# Patient Record
Sex: Female | Born: 1955 | Race: Black or African American | Hispanic: No | Marital: Single
Health system: Southern US, Academic
[De-identification: ages and names within clinical notes are randomized; demographics above are authoritative.]

## PROBLEM LIST (undated history)

## (undated) ENCOUNTER — Ambulatory Visit

## (undated) ENCOUNTER — Telehealth

## (undated) ENCOUNTER — Encounter

## (undated) ENCOUNTER — Other Ambulatory Visit

## (undated) ENCOUNTER — Encounter: Attending: Rheumatology | Primary: Rheumatology

## (undated) ENCOUNTER — Ambulatory Visit: Attending: Pharmacist | Primary: Pharmacist

## (undated) ENCOUNTER — Encounter: Attending: Physical Medicine & Rehabilitation | Primary: Physical Medicine & Rehabilitation

## (undated) ENCOUNTER — Encounter
Attending: Student in an Organized Health Care Education/Training Program | Primary: Student in an Organized Health Care Education/Training Program

## (undated) ENCOUNTER — Encounter: Attending: Internal Medicine | Primary: Internal Medicine

## (undated) ENCOUNTER — Ambulatory Visit: Payer: PRIVATE HEALTH INSURANCE

## (undated) ENCOUNTER — Telehealth: Attending: Rheumatology | Primary: Rheumatology

## (undated) ENCOUNTER — Ambulatory Visit: Attending: Clinical | Primary: Clinical

## (undated) ENCOUNTER — Ambulatory Visit
Payer: BLUE CROSS/BLUE SHIELD | Attending: Student in an Organized Health Care Education/Training Program | Primary: Student in an Organized Health Care Education/Training Program

## (undated) ENCOUNTER — Ambulatory Visit: Payer: Medicare (Managed Care)

## (undated) ENCOUNTER — Ambulatory Visit: Payer: MEDICARE

## (undated) ENCOUNTER — Encounter: Attending: Clinical | Primary: Clinical

## (undated) ENCOUNTER — Encounter: Attending: Pharmacist | Primary: Pharmacist

## (undated) ENCOUNTER — Telehealth: Attending: Ambulatory Care | Primary: Ambulatory Care

## (undated) ENCOUNTER — Ambulatory Visit
Payer: MEDICARE | Attending: Student in an Organized Health Care Education/Training Program | Primary: Student in an Organized Health Care Education/Training Program

## (undated) ENCOUNTER — Encounter: Attending: Ambulatory Care | Primary: Ambulatory Care

## (undated) ENCOUNTER — Inpatient Hospital Stay

## (undated) ENCOUNTER — Ambulatory Visit: Payer: Medicare (Managed Care) | Attending: Orthopaedic Surgery | Primary: Orthopaedic Surgery

## (undated) ENCOUNTER — Encounter: Attending: Family | Primary: Family

## (undated) ENCOUNTER — Encounter: Attending: Women's Health | Primary: Women's Health

## (undated) ENCOUNTER — Ambulatory Visit
Attending: Student in an Organized Health Care Education/Training Program | Primary: Student in an Organized Health Care Education/Training Program

## (undated) ENCOUNTER — Telehealth: Attending: Nurse Practitioner | Primary: Nurse Practitioner

## (undated) ENCOUNTER — Telehealth
Attending: Student in an Organized Health Care Education/Training Program | Primary: Student in an Organized Health Care Education/Training Program

## (undated) ENCOUNTER — Ambulatory Visit: Payer: MEDICARE | Attending: Physical Medicine & Rehabilitation | Primary: Physical Medicine & Rehabilitation

## (undated) ENCOUNTER — Ambulatory Visit: Payer: BLUE CROSS/BLUE SHIELD

## (undated) ENCOUNTER — Telehealth: Attending: Social Worker | Primary: Social Worker

## (undated) ENCOUNTER — Encounter
Payer: BLUE CROSS/BLUE SHIELD | Attending: Physical Medicine & Rehabilitation | Primary: Physical Medicine & Rehabilitation

## (undated) ENCOUNTER — Encounter: Attending: Hospitalist | Primary: Hospitalist

## (undated) ENCOUNTER — Encounter
Attending: Pharmacist Clinician (PhC)/ Clinical Pharmacy Specialist | Primary: Pharmacist Clinician (PhC)/ Clinical Pharmacy Specialist

## (undated) ENCOUNTER — Ambulatory Visit
Payer: Medicare (Managed Care) | Attending: Student in an Organized Health Care Education/Training Program | Primary: Student in an Organized Health Care Education/Training Program

## (undated) ENCOUNTER — Telehealth: Attending: Clinical | Primary: Clinical

## (undated) ENCOUNTER — Telehealth: Attending: Family | Primary: Family

## (undated) ENCOUNTER — Telehealth: Attending: Internal Medicine | Primary: Internal Medicine

## (undated) ENCOUNTER — Ambulatory Visit
Payer: PRIVATE HEALTH INSURANCE | Attending: Rehabilitative and Restorative Service Providers" | Primary: Rehabilitative and Restorative Service Providers"

## (undated) ENCOUNTER — Ambulatory Visit
Payer: BLUE CROSS/BLUE SHIELD | Attending: Physical Medicine & Rehabilitation | Primary: Physical Medicine & Rehabilitation

## (undated) ENCOUNTER — Encounter: Payer: BLUE CROSS/BLUE SHIELD | Attending: Rheumatology | Primary: Rheumatology

## (undated) ENCOUNTER — Ambulatory Visit: Payer: BLUE CROSS/BLUE SHIELD | Attending: Rheumatology | Primary: Rheumatology

## (undated) ENCOUNTER — Non-Acute Institutional Stay: Payer: BLUE CROSS/BLUE SHIELD

## (undated) DIAGNOSIS — K219 Gastro-esophageal reflux disease without esophagitis: Secondary | ICD-10-CM

## (undated) DIAGNOSIS — F32A Depression, unspecified: Secondary | ICD-10-CM

## (undated) DIAGNOSIS — F329 Major depressive disorder, single episode, unspecified: Secondary | ICD-10-CM

## (undated) DIAGNOSIS — M069 Rheumatoid arthritis, unspecified: Secondary | ICD-10-CM

## (undated) DIAGNOSIS — I4891 Unspecified atrial fibrillation: Secondary | ICD-10-CM

## (undated) HISTORY — PX: ATRIAL FIBRILLATION ABLATION: SHX5732

## (undated) HISTORY — PX: OVARIAN CYST REMOVAL: SHX89

## (undated) MED ORDER — METHOCARBAMOL 500 MG TABLET
Freq: Four times a day (QID) | ORAL | 0 days
Start: ? — End: 2020-04-07

---
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
## 2003-04-04 ENCOUNTER — Other Ambulatory Visit: Admission: RE | Admit: 2003-04-04 | Discharge: 2003-04-04 | Payer: Self-pay | Admitting: Internal Medicine

## 2004-05-12 ENCOUNTER — Emergency Department: Payer: Self-pay | Admitting: General Practice

## 2005-02-01 ENCOUNTER — Ambulatory Visit: Payer: Self-pay | Admitting: Internal Medicine

## 2005-02-01 ENCOUNTER — Other Ambulatory Visit: Admission: RE | Admit: 2005-02-01 | Discharge: 2005-02-01 | Payer: Self-pay | Admitting: Internal Medicine

## 2005-06-10 ENCOUNTER — Ambulatory Visit: Payer: Self-pay | Admitting: Family Medicine

## 2005-07-05 ENCOUNTER — Ambulatory Visit: Payer: Self-pay | Admitting: Internal Medicine

## 2005-08-20 ENCOUNTER — Inpatient Hospital Stay: Payer: Self-pay | Admitting: Internal Medicine

## 2005-09-16 ENCOUNTER — Ambulatory Visit: Payer: Self-pay | Admitting: Internal Medicine

## 2005-09-23 ENCOUNTER — Observation Stay: Payer: Self-pay | Admitting: Internal Medicine

## 2006-01-03 ENCOUNTER — Ambulatory Visit: Payer: Self-pay | Admitting: Internal Medicine

## 2011-01-01 DIAGNOSIS — F17200 Nicotine dependence, unspecified, uncomplicated: Secondary | ICD-10-CM | POA: Diagnosis present

## 2014-01-08 ENCOUNTER — Emergency Department: Payer: Self-pay | Admitting: Emergency Medicine

## 2014-01-09 LAB — BASIC METABOLIC PANEL
ANION GAP: 8 (ref 7–16)
BUN: 23 mg/dL — ABNORMAL HIGH (ref 7–18)
CHLORIDE: 108 mmol/L — AB (ref 98–107)
CO2: 23 mmol/L (ref 21–32)
Calcium, Total: 9 mg/dL (ref 8.5–10.1)
Creatinine: 1.05 mg/dL (ref 0.60–1.30)
GFR CALC NON AF AMER: 59 — AB
GLUCOSE: 104 mg/dL — AB (ref 65–99)
OSMOLALITY: 282 (ref 275–301)
POTASSIUM: 4.1 mmol/L (ref 3.5–5.1)
Sodium: 139 mmol/L (ref 136–145)

## 2014-01-09 LAB — CBC
HCT: 44.1 % (ref 35.0–47.0)
HGB: 14.8 g/dL (ref 12.0–16.0)
MCH: 30.4 pg (ref 26.0–34.0)
MCHC: 33.5 g/dL (ref 32.0–36.0)
MCV: 91 fL (ref 80–100)
Platelet: 299 10*3/uL (ref 150–440)
RBC: 4.85 10*6/uL (ref 3.80–5.20)
RDW: 13.7 % (ref 11.5–14.5)
WBC: 12.2 10*3/uL — AB (ref 3.6–11.0)

## 2014-01-09 LAB — TROPONIN I

## 2014-08-26 ENCOUNTER — Emergency Department
Admit: 2014-08-26 | Disposition: A | Discharge status: Home / Self Care | Payer: Self-pay | Admitting: Emergency Medicine

## 2016-01-26 ENCOUNTER — Emergency Department: Payer: 59

## 2016-01-26 ENCOUNTER — Inpatient Hospital Stay
Admission: EM | Admit: 2016-01-26 | Discharge: 2016-01-27 | DRG: 540 | Payer: 59 | Attending: Internal Medicine | Admitting: Internal Medicine

## 2016-01-26 DIAGNOSIS — F17200 Nicotine dependence, unspecified, uncomplicated: Secondary | ICD-10-CM | POA: Diagnosis present

## 2016-01-26 DIAGNOSIS — Z79899 Other long term (current) drug therapy: Secondary | ICD-10-CM

## 2016-01-26 DIAGNOSIS — Z9889 Other specified postprocedural states: Secondary | ICD-10-CM | POA: Diagnosis not present

## 2016-01-26 DIAGNOSIS — I4891 Unspecified atrial fibrillation: Secondary | ICD-10-CM | POA: Diagnosis present

## 2016-01-26 DIAGNOSIS — M86172 Other acute osteomyelitis, left ankle and foot: Principal | ICD-10-CM

## 2016-01-26 DIAGNOSIS — F329 Major depressive disorder, single episode, unspecified: Secondary | ICD-10-CM | POA: Diagnosis present

## 2016-01-26 DIAGNOSIS — K219 Gastro-esophageal reflux disease without esophagitis: Secondary | ICD-10-CM | POA: Diagnosis present

## 2016-01-26 DIAGNOSIS — Z833 Family history of diabetes mellitus: Secondary | ICD-10-CM | POA: Diagnosis not present

## 2016-01-26 DIAGNOSIS — I4892 Unspecified atrial flutter: Secondary | ICD-10-CM | POA: Diagnosis present

## 2016-01-26 DIAGNOSIS — M1990 Unspecified osteoarthritis, unspecified site: Secondary | ICD-10-CM | POA: Diagnosis not present

## 2016-01-26 DIAGNOSIS — M869 Osteomyelitis, unspecified: Secondary | ICD-10-CM | POA: Diagnosis present

## 2016-01-26 DIAGNOSIS — Z803 Family history of malignant neoplasm of breast: Secondary | ICD-10-CM

## 2016-01-26 DIAGNOSIS — M069 Rheumatoid arthritis, unspecified: Secondary | ICD-10-CM | POA: Diagnosis present

## 2016-01-26 DIAGNOSIS — F32A Depression, unspecified: Secondary | ICD-10-CM | POA: Diagnosis present

## 2016-01-26 HISTORY — DX: Gastro-esophageal reflux disease without esophagitis: K21.9

## 2016-01-26 HISTORY — DX: Rheumatoid arthritis, unspecified: M06.9

## 2016-01-26 HISTORY — DX: Unspecified atrial fibrillation: I48.91

## 2016-01-26 HISTORY — DX: Depression, unspecified: F32.A

## 2016-01-26 HISTORY — DX: Major depressive disorder, single episode, unspecified: F32.9

## 2016-01-26 LAB — COMPREHENSIVE METABOLIC PANEL
ALBUMIN: 3.9 g/dL (ref 3.5–5.0)
ALT: 19 U/L (ref 14–54)
ANION GAP: 8 (ref 5–15)
AST: 25 U/L (ref 15–41)
Alkaline Phosphatase: 59 U/L (ref 38–126)
BILIRUBIN TOTAL: 0.4 mg/dL (ref 0.3–1.2)
BUN: 24 mg/dL — ABNORMAL HIGH (ref 6–20)
CHLORIDE: 109 mmol/L (ref 101–111)
CO2: 22 mmol/L (ref 22–32)
Calcium: 8.9 mg/dL (ref 8.9–10.3)
Creatinine, Ser: 1.11 mg/dL — ABNORMAL HIGH (ref 0.44–1.00)
GFR calc Af Amer: >60 mL/min (ref >60)
GFR, EST NON AFRICAN AMERICAN: 54 mL/min — AB (ref >60)
Glucose, Bld: 106 mg/dL — ABNORMAL HIGH (ref 65–99)
POTASSIUM: 3.9 mmol/L (ref 3.5–5.1)
Sodium: 139 mmol/L (ref 135–145)
TOTAL PROTEIN: 7.9 g/dL (ref 6.5–8.1)

## 2016-01-26 LAB — CBC WITH DIFFERENTIAL/PLATELET
BASOS ABS: 0.1 10*3/uL (ref 0–0.1)
BASOS PCT: 1 %
EOS PCT: 2 %
Eosinophils Absolute: 0.3 10*3/uL (ref 0–0.7)
HEMATOCRIT: 43.4 % (ref 35.0–47.0)
Hemoglobin: 14.5 g/dL (ref 12.0–16.0)
Lymphocytes Relative: 17 %
Lymphs Abs: 2 10*3/uL (ref 1.0–3.6)
MCH: 31.1 pg (ref 26.0–34.0)
MCHC: 33.5 g/dL (ref 32.0–36.0)
MCV: 92.9 fL (ref 80.0–100.0)
MONO ABS: 1.2 10*3/uL — AB (ref 0.2–0.9)
MONOS PCT: 10 %
NEUTROS ABS: 8.5 10*3/uL — AB (ref 1.4–6.5)
Neutrophils Relative %: 70 %
PLATELETS: 252 10*3/uL (ref 150–440)
RBC: 4.67 MIL/uL (ref 3.80–5.20)
RDW: 15.4 % — AB (ref 11.5–14.5)
WBC: 12.1 10*3/uL — ABNORMAL HIGH (ref 3.6–11.0)

## 2016-01-26 MED ORDER — SODIUM CHLORIDE 0.9 % IV SOLN
INTRAVENOUS | Status: AC
  Administered 2016-01-26: 23:00:00 via INTRAVENOUS

## 2016-01-26 MED ORDER — PREDNISONE 10 MG PO TABS
10.0000 mg | ORAL_TABLET | Freq: Every day | ORAL | Status: DC
  Administered 2016-01-27: 10 mg via ORAL
  Filled 2016-01-26: qty 1

## 2016-01-26 MED ORDER — OXYCODONE HCL 5 MG PO TABS
5.0000 mg | ORAL_TABLET | ORAL | Status: DC | PRN
  Administered 2016-01-26: 5 mg via ORAL
  Filled 2016-01-26: qty 1

## 2016-01-26 MED ORDER — VANCOMYCIN HCL IN DEXTROSE 1-5 GM/200ML-% IV SOLN
1000.0000 mg | Freq: Two times a day (BID) | INTRAVENOUS | Status: DC
  Administered 2016-01-27: 1000 mg via INTRAVENOUS
  Filled 2016-01-26 (×4): qty 200

## 2016-01-26 MED ORDER — ACETAMINOPHEN 325 MG PO TABS: 650.0000 mg | ORAL_TABLET | Freq: Four times a day (QID) | ORAL | Status: DC | PRN

## 2016-01-26 MED ORDER — HYDROXYCHLOROQUINE SULFATE 200 MG PO TABS
200.0000 mg | ORAL_TABLET | Freq: Two times a day (BID) | ORAL | Status: DC
Start: 2016-01-26 — End: 2016-01-27
  Administered 2016-01-26 – 2016-01-27 (×2): 200 mg via ORAL
  Filled 2016-01-26 (×2): qty 1

## 2016-01-26 MED ORDER — ACETAMINOPHEN 650 MG RE SUPP: 650.0000 mg | Freq: Four times a day (QID) | RECTAL | Status: DC | PRN

## 2016-01-26 MED ORDER — ONDANSETRON HCL 4 MG/2ML IJ SOLN: 4.0000 mg | Freq: Four times a day (QID) | INTRAMUSCULAR | Status: DC | PRN

## 2016-01-26 MED ORDER — SODIUM CHLORIDE 0.9% FLUSH
3.0000 mL | Freq: Two times a day (BID) | INTRAVENOUS | Status: DC
  Administered 2016-01-27: 3 mL via INTRAVENOUS

## 2016-01-26 MED ORDER — BUPROPION HCL ER (SR) 150 MG PO TB12
150.0000 mg | ORAL_TABLET | Freq: Two times a day (BID) | ORAL | Status: DC
  Administered 2016-01-26 – 2016-01-27 (×2): 150 mg via ORAL
  Filled 2016-01-26 (×2): qty 1

## 2016-01-26 MED ORDER — PANTOPRAZOLE SODIUM 40 MG PO TBEC
40.0000 mg | DELAYED_RELEASE_TABLET | Freq: Every day | ORAL | Status: DC
  Administered 2016-01-27: 40 mg via ORAL
  Filled 2016-01-26: qty 1

## 2016-01-26 MED ORDER — VANCOMYCIN HCL IN DEXTROSE 1-5 GM/200ML-% IV SOLN
1000.0000 mg | Freq: Once | INTRAVENOUS | Status: AC
  Administered 2016-01-26: 1000 mg via INTRAVENOUS
  Filled 2016-01-26 (×2): qty 200

## 2016-01-26 MED ORDER — DEXTROSE 5 % IV SOLN
2.0000 g | Freq: Three times a day (TID) | INTRAVENOUS | Status: DC
  Administered 2016-01-26 – 2016-01-27 (×3): 2 g via INTRAVENOUS
  Filled 2016-01-26 (×6): qty 2

## 2016-01-26 MED ORDER — RIVAROXABAN 10 MG PO TABS: 20.0000 mg | ORAL_TABLET | Freq: Every day | ORAL | Status: DC

## 2016-01-26 MED ORDER — ONDANSETRON HCL 4 MG PO TABS: 4.0000 mg | ORAL_TABLET | Freq: Four times a day (QID) | ORAL | Status: DC | PRN

## 2016-01-26 NOTE — ED Provider Notes (Addendum)
[facility] Emergency Department Provider Note   ____________________________________________   First MD Initiated Contact with Patient 01/26/16 1740     (approximate)  I have reviewed the triage vital signs and the nursing notes.   HISTORY  Chief Complaint Toe Pain   HPI Jenna Davis is a 60 y.o. female  with a history of rheumatoid arthritis is presenting with bilateral great toe pain. She said that she do flare of her rheumatoid arthritis several weeks ago and has had swelling in her bilateral toes ever since. She said that she had x-rays done of the left great toe which is been painful over the past several weeks and was sent directly over to the emergency department for further evaluation because of possible septic arthritis. She says that she also has swelling to the right great toe which is also painful. Denies any fevers. Says is compliant with her Humira. Ulcer take methotrexate.   Past Medical History:  Diagnosis Date  . Rheumatoid arthritis (HCC)     There are no active problems to display for this patient.   Past Surgical History:  Procedure Laterality Date  . ATRIAL FIBRILLATION ABLATION      Prior to Admission medications   Not on File    Allergies Review of patient's allergies indicates no known allergies.  No family history on file.  Social History Social History  Substance Use Topics  . Smoking status: Current Every Day Smoker  . Smokeless tobacco: Never Used  . Alcohol use No    Review of Systems Constitutional: No fever/chills Eyes: No visual changes. ENT: No sore throat. Cardiovascular: Denies chest pain. Respiratory: Denies shortness of breath. Gastrointestinal: No abdominal pain.  No nausea, no vomiting.  No diarrhea.  No constipation. Genitourinary: Negative for dysuria. Musculoskeletal: Negative for back pain. Skin: Negative for rash. Neurological: Negative for headaches, focal weakness or  numbness.  10-point ROS otherwise negative.  ____________________________________________   PHYSICAL EXAM:  VITAL SIGNS: ED Triage Vitals  Enc Vitals Group     BP 01/26/16 1651 136/72     Pulse Rate 01/26/16 1651 81     Resp 01/26/16 1651 18     Temp 01/26/16 1651 97.9 F (36.6 C)     Temp Source 01/26/16 1651 Oral     SpO2 01/26/16 1651 99 %     Weight 01/26/16 1651 205 lb (93 kg)     Height 01/26/16 1651 5\' 6"  (1.676 m)     Head Circumference --      Peak Flow --      Pain Score 01/26/16 1652 8     Pain Loc --      Pain Edu? --      Excl. in GC? --     Constitutional: Alert and oriented. Well appearing and in no acute distress. Eyes: Conjunctivae are normal. PERRL. EOMI. Head: Atraumatic. Nose: No congestion/rhinnorhea. Mouth/Throat: Mucous membranes are moist.   Neck: No stridor.   Cardiovascular: Normal rate, regular rhythm. Grossly normal heart sounds.   Respiratory: Normal respiratory effort.  No retractions. Lungs CTAB. Gastrointestinal: Soft and nontender. No distention.  Musculoskeletal: Left great toe with swelling which is concentric. Tenderness palpation especially over the interphalangeal joint. Moderately warm as well as tender to palpation. Patient is able to fully range this left great toe. Also with the right great toe with swelling at the MTP joint which extends medially and is fluctuant with mild erythema. Swelling is about 3 cm in diameter and extends about  2 cm medially off of the MTP joint. The right toe is also with full range of active motion as well as sensation intact to light touch as is also present on the left. Dorsalis pedis pulses are present and equal bilaterally. Neurologic:  Normal speech and language. No gross focal neurologic deficits are appreciated. No gait instability. Skin:  Skin is warm, dry and intact. No rash noted. Psychiatric: Mood and affect are normal. Speech and behavior are  normal.  ____________________________________________   LABS (all labs ordered are listed, but only abnormal results are displayed)  Labs Reviewed  CBC WITH DIFFERENTIAL/PLATELET - Abnormal; Notable for the following:       Result Value   WBC 12.1 (*)    RDW 15.4 (*)    Neutro Abs 8.5 (*)    Monocytes Absolute 1.2 (*)    All other components within normal limits  COMPREHENSIVE METABOLIC PANEL - Abnormal; Notable for the following:    Glucose, Bld 106 (*)    BUN 24 (*)    Creatinine, Ser 1.11 (*)    GFR calc non Af Amer 54 (*)    All other components within normal limits   ____________________________________________  EKG   ____________________________________________  RADIOLOGY  Alisia Ferrari Great Left (Accession [id number]) (Order [id number])  Imaging  Date: 01/26/2016 Department: [facility] EMERGENCY DEPARTMENT Released By: Lynelle Smoke, RN (auto-released) Authorizing: Jene Every, MD  PACS Images   Show images for DG Toe Great Left  Study Result   CLINICAL DATA:  Swelling, pain  EXAM: LEFT GREAT TOE  COMPARISON:  None.  FINDINGS: Three views of the left great toe submitted. There is bone destruction and irregularity distal aspect of proximal phalanx. There is adjacent soft tissue swelling. Findings highly suspicious for osteomyelitis or septic arthritis. Clinical correlation is necessary. Further evaluation with MRI is recommended.  IMPRESSION: There is bone destruction and irregularity distal aspect of proximal phalanx. Findings highly suspicious for osteomyelitis or septic arthritis. Clinical correlation is necessary. Further evaluation with MRI is recommended.   Electronically Signed   By: Natasha Mead M.D.   On: 01/26/2016 18:03    DG Toe Great Right (Accession [id number]) (Order [id number])  Imaging  Date: 01/26/2016 Department: [facility] EMERGENCY DEPARTMENT Released By/Authorizing: Myrna Blazer, MD (auto-released)  PACS Images   Show images for DG Toe Great Right  Study Result   CLINICAL DATA:  Swelling toe, tender to touch for 4-5 weeks. History of rheumatoid arthritis.  EXAM: RIGHT GREAT TOE  COMPARISON:  None.  FINDINGS: No acute fracture deformity or dislocation. Joint space intact without erosions. Soft tissue planes are not suspicious. First metatarsal tibial bipartite sesamoid. The first MTP medial soft tissue swelling without subcutaneous gas or radiopaque foreign bodies. Slight erosive changes of the first metatarsal head.  IMPRESSION: Medial MTP soft tissue swelling and first metatarsal head erosion concerning for gout.   Electronically Signed   By: Awilda Metro M.D.   On: 01/26/2016 19:29     ____________________________________________   PROCEDURES  Procedure(s) performed:   Procedures  Critical Care performed:   ____________________________________________   INITIAL IMPRESSION / ASSESSMENT AND PLAN / ED COURSE  Pertinent labs & imaging results that were available during my care of the patient were reviewed by me and considered in my medical decision making (see chart for details).  ----------------------------------------- 8:09 PM on 01/26/2016 -----------------------------------------  I discussed the case with Dr. Graciela Husbands of podiatry who recommends admission as well as  IV antibiotics for osteomyelitis. He says that he is considering obtaining a terrible will need to discuss further and examine the patient. I discussed with the patient the need for [facility] and further evaluation by the podiatrist in order to evaluate best course of action. She is aware of the need for admission as well as antibiotics and willing to comply.  Clinical Course     ____________________________________________   FINAL CLINICAL IMPRESSION(S) / ED DIAGNOSES  Osteomyelitis of the left great toe. Right great toe toe acute  arthritis to the MTP joint.    NEW MEDICATIONS STARTED DURING THIS VISIT:  New Prescriptions   No medications on file     Note:  This document was prepared using Dragon voice recognition software and may include unintentional dictation errors.    Myrna Blazer, MD 01/26/16 2010  Dr. Graciela Husbands to follow as a consult. Signed out to Dr. Hilton Sinclair.    Myrna Blazer, MD 01/26/16 2010

## 2016-01-26 NOTE — Progress Notes (Signed)
Pharmacy Antibiotic Note  Jenna Davis is a 60 y.o. female admitted on 01/26/2016 with osteomyelitis.  Pharmacy has been consulted for cefepime/vancomycin dosing.  Plan: Will initiated Vancomycin 1000mg  IV x 1 then Vancomycin 1000mg  IV Q12h after 1st dose per stacked dose protocol. Will order Vancomycin trough level prior to the fifth dose. Goal trough 15-68mcg/mL. Initiated Cefepime 2g IV Q8h.   Ke=0.057 T1/2= 12.16 Vd=50.95 Estimated trough=19  Height: 5\' 6"  (167.6 cm) Weight: 205 lb (93 kg) IBW/kg (Calculated) : 59.3  Temp (24hrs), Avg:97.9 F (36.6 C), Min:97.9 F (36.6 C), Max:97.9 F (36.6 C)   Recent Labs Lab 01/26/16 1655  WBC 12.1*  CREATININE 1.11*    Estimated Creatinine Clearance: 63.5 mL/min (by C-G formula based on SCr of 1.11 mg/dL).    No Known Allergies  Antimicrobials this admission: Cefepime 9/8 >>  Vancomycin 9/8 >>   Microbiology results: 9/8 BCx: recommend  Thank you for allowing pharmacy to be a part of this patient's care.  11/8 01/26/2016 8:12 PM

## 2016-01-26 NOTE — ED Triage Notes (Signed)
Left great toe pain for 4-5 weeks. Had xray by PCP in August. Sent to ER from [facility] walk in. Great toe swollen, odorous, tender to touch. No obvious drainage at this time. Pt alert and oriented X4, active, cooperative, pt in NAD. RR even and unlabored, color WNL.

## 2016-01-26 NOTE — H&P (Signed)
[facility] [place] at [facility]   PATIENT NAME: Jenna Davis    MR#:  [id number]  DATE OF BIRTH:  [birth year]  DATE OF ADMISSION:  01/26/2016  PRIMARY CARE PHYSICIAN: Patterson Hammersmith, MD   REQUESTING/REFERRING PHYSICIAN: Pershing Proud, MD  CHIEF COMPLAINT:   Chief Complaint  Patient presents with  . Toe Pain    HISTORY OF PRESENT ILLNESS:  Jenna Davis  is a 60 y.o. female who presents with Left great toe swelling and joint pain. Patient states she was recently diagnosed with rheumatoid arthritis and felt that her joint pain was due to that. However, the pain has progressed recently and she came to the ED for evaluation. X-ray imaging here strongly suggests osteomyelitis in that joint. Podiatry was consulted from the ED recommended admission with IV antibiotics and they will consult on her in the morning. Hospitalists were called for the same.  PAST MEDICAL HISTORY:   Past Medical History:  Diagnosis Date  . A-fib (HCC)   . Depression   . GERD (gastroesophageal reflux disease)   . RA (rheumatoid arthritis) (HCC)   . Rheumatoid arthritis (HCC)     PAST SURGICAL HISTORY:   Past Surgical History:  Procedure Laterality Date  . ATRIAL FIBRILLATION ABLATION    . OVARIAN CYST REMOVAL      SOCIAL HISTORY:   Social History  Substance Use Topics  . Smoking status: Current Every Day Smoker  . Smokeless tobacco: Never Used  . Alcohol use No    FAMILY HISTORY:   Family History  Problem Relation Age of Onset  . Diabetes Mother   . Breast cancer Mother   . Diabetes Father     DRUG ALLERGIES:  No Known Allergies  MEDICATIONS AT HOME:   Prior to Admission medications   Medication Sig Start Date End Date Taking? Authorizing Provider  Adalimumab 40 MG/0.8ML PNKT Inject 40 mg into the skin daily. Due  Wednesday 01/31/2016 12/25/15  Yes Historical Provider, MD  buPROPion (WELLBUTRIN SR) 150 MG 12 hr tablet Take 1 tablet by mouth 2 (two) times  daily. 01/12/16  Yes Historical Provider, MD  folic acid (FOLVITE) 1 MG tablet Take 1 mg by mouth daily. 01/04/15  Yes Historical Provider, MD  hydroxychloroquine (PLAQUENIL) 200 MG tablet Take 1 tablet by mouth 2 (two) times daily. 01/22/16  Yes Historical Provider, MD  ibuprofen (ADVIL,MOTRIN) 600 MG tablet Take 600 mg by mouth every 6 (six) hours. 09/22/15  Yes Historical Provider, MD  methotrexate (RHEUMATREX) 2.5 MG tablet Take 8 tablets by mouth once a week. On wednesdays 12/26/15  Yes Historical Provider, MD  omeprazole (PRILOSEC) 20 MG capsule Take 20 mg by mouth daily. 01/23/16  Yes Historical Provider, MD  predniSONE (DELTASONE) 5 MG tablet Take 2 tablets by mouth daily. 10mg  daily until Sunday. Monday begins 5mg  until friday 12/25/15  Yes Historical Provider, MD  ranitidine (ZANTAC) 150 MG tablet Take 150 mg by mouth at bedtime. As needed 03/13/15  Yes Historical Provider, MD  XARELTO 20 MG TABS tablet Take 1 tablet by mouth daily. 01/23/16  Yes Historical Provider, MD    REVIEW OF SYSTEMS:  Review of Systems  Constitutional: Negative for chills, fever, malaise/fatigue and weight loss.  HENT: Negative for ear pain, hearing loss and tinnitus.   Eyes: Negative for blurred vision, double vision, pain and redness.  Respiratory: Negative for cough, hemoptysis and shortness of breath.   Cardiovascular: Negative for chest pain, palpitations, orthopnea and leg swelling.  Gastrointestinal: Negative  for abdominal pain, constipation, diarrhea, nausea and vomiting.  Genitourinary: Negative for dysuria, frequency and hematuria.  Musculoskeletal: Positive for joint pain (left great toe). Negative for back pain and neck pain.  Skin:       No acne, rash, or lesions  Neurological: Negative for dizziness, tremors, focal weakness and weakness.  Endo/Heme/Allergies: Negative for polydipsia. Does not bruise/bleed easily.  Psychiatric/Behavioral: Negative for depression. The patient is not nervous/anxious and does  not have insomnia.      VITAL SIGNS:   Vitals:   01/26/16 1651 01/26/16 2024  BP: 136/72 114/71  Pulse: 81 77  Resp: 18 18  Temp: 97.9 F (36.6 C)   TempSrc: Oral   SpO2: 99% 99%  Weight: 93 kg (205 lb)   Height: 5\' 6"  (1.676 m)    Wt Readings from Last 3 Encounters:  01/26/16 93 kg (205 lb)    PHYSICAL EXAMINATION:  Physical Exam  Vitals reviewed. Constitutional: She is oriented to person, place, and time. She appears well-developed and well-nourished. No distress.  HENT:  Head: Normocephalic and atraumatic.  Mouth/Throat: Oropharynx is clear and moist.  Eyes: Conjunctivae and EOM are normal. Pupils are equal, round, and reactive to light. No scleral icterus.  Neck: Normal range of motion. Neck supple. No JVD present. No thyromegaly present.  Cardiovascular: Normal rate, regular rhythm and intact distal pulses.  Exam reveals no gallop and no friction rub.   No murmur heard. Respiratory: Effort normal and breath sounds normal. No respiratory distress. She has no wheezes. She has no rales.  GI: Soft. Bowel sounds are normal. She exhibits no distension. There is no tenderness.  Musculoskeletal: Normal range of motion. She exhibits edema (Left great toe) and tenderness ( left great toe).  No arthritis, no gout  Lymphadenopathy:    She has no cervical adenopathy.  Neurological: She is alert and oriented to person, place, and time. No cranial nerve deficit.  No dysarthria, no aphasia  Skin: Skin is warm and dry. No rash noted. No erythema.  Psychiatric: She has a normal mood and affect. Her behavior is normal. Judgment and thought content normal.    LABORATORY PANEL:   CBC  Recent Labs Lab 01/26/16 1655  WBC 12.1*  HGB 14.5  HCT 43.4  PLT 252   ------------------------------------------------------------------------------------------------------------------  Chemistries   Recent Labs Lab 01/26/16 1655  NA 139  K 3.9  CL 109  CO2 22  GLUCOSE 106*  BUN 24*   CREATININE 1.11*  CALCIUM 8.9  AST 25  ALT 19  ALKPHOS 59  BILITOT 0.4   ------------------------------------------------------------------------------------------------------------------  Cardiac Enzymes No results for input(s): TROPONINI in the last 168 hours. ------------------------------------------------------------------------------------------------------------------  RADIOLOGY:  Dg Toe Great Left  Result Date: 01/26/2016 CLINICAL DATA:  Swelling, pain EXAM: LEFT GREAT TOE COMPARISON:  None. FINDINGS: Three views of the left great toe submitted. There is bone destruction and irregularity distal aspect of proximal phalanx. There is adjacent soft tissue swelling. Findings highly suspicious for osteomyelitis or septic arthritis. Clinical correlation is necessary. Further evaluation with MRI is recommended. IMPRESSION: There is bone destruction and irregularity distal aspect of proximal phalanx. Findings highly suspicious for osteomyelitis or septic arthritis. Clinical correlation is necessary. Further evaluation with MRI is recommended. Electronically Signed   By: 03/27/2016 M.D.   On: 01/26/2016 18:03   Dg Toe Great Right  Result Date: 01/26/2016 CLINICAL DATA:  Swelling toe, tender to touch for 4-5 weeks. History of rheumatoid arthritis. EXAM: RIGHT GREAT TOE COMPARISON:  None.  FINDINGS: No acute fracture deformity or dislocation. Joint space intact without erosions. Soft tissue planes are not suspicious. First metatarsal tibial bipartite sesamoid. The first MTP medial soft tissue swelling without subcutaneous gas or radiopaque foreign bodies. Slight erosive changes of the first metatarsal head. IMPRESSION: Medial MTP soft tissue swelling and first metatarsal head erosion concerning for gout. Electronically Signed   By: Awilda Metroourtnay  Bloomer M.D.   On: 01/26/2016 19:29    EKG:   Orders placed or performed in visit on 01/08/14  . EKG 12-Lead    IMPRESSION AND PLAN:  Principal  Problem:   Osteomyelitis (HCC) - IV antibiotics started in the ED and continued on admission per podiatry recommendation. Podiatry consult ordered. Unclear etiology for development of a swelling is in that toe as patient has not had any other recent signs or symptoms of infection and denies any IV drug use. No evidence for direct trauma visible on exam. Active Problems:   RA (rheumatoid arthritis) (HCC) - continue home meds   A-fib (HCC) - continue home rate controlling meds as well as anticoagulation   GERD (gastroesophageal reflux disease) - home dose PPI   Depression - continue home antidepressant  All the records are reviewed and case discussed with ED provider. Management plans discussed with the patient and/or family.  DVT PROPHYLAXIS: Systemic anticoagulation  GI PROPHYLAXIS: PPI  ADMISSION STATUS: Inpatient  CODE STATUS: Full Code Status History    This patient does not have a recorded code status. Please follow your organizational policy for patients in this situation.      TOTAL TIME TAKING CARE OF THIS PATIENT: 45 minutes.    Traeson Dusza FIELDING 01/26/2016, 10:02 PM  [facility]  Office  [phone]  CC: Primary care physician; Patterson HammersmithOLFORD, CRISTIN M, MD

## 2016-01-26 NOTE — ED Notes (Signed)
Pt still not in room at this time

## 2016-01-26 NOTE — ED Notes (Signed)
Pt educated on delay.  Voiced understanding.  No needs at this time.  Pt up to rest room.

## 2016-01-27 ENCOUNTER — Encounter
Admission: EM | Disposition: A | Discharge status: Other Acute Inpatient Hospital | Payer: Self-pay | Source: Home / Self Care | Attending: Internal Medicine

## 2016-01-27 ENCOUNTER — Ambulatory Visit (HOSPITAL_COMMUNITY)
Admission: AD | Admit: 2016-01-27 | Discharge: 2016-01-27 | Disposition: A | Discharge status: Home / Self Care | Payer: 59 | Source: Other Acute Inpatient Hospital | Attending: Internal Medicine | Admitting: Internal Medicine

## 2016-01-27 DIAGNOSIS — M869 Osteomyelitis, unspecified: Secondary | ICD-10-CM | POA: Insufficient documentation

## 2016-01-27 DIAGNOSIS — D849 Immunodeficiency, unspecified: Secondary | ICD-10-CM | POA: Diagnosis present

## 2016-01-27 LAB — BASIC METABOLIC PANEL
Anion gap: 7 (ref 5–15)
BUN: 18 mg/dL (ref 6–20)
CHLORIDE: 110 mmol/L (ref 101–111)
CO2: 23 mmol/L (ref 22–32)
CREATININE: 0.91 mg/dL (ref 0.44–1.00)
Calcium: 8.6 mg/dL — ABNORMAL LOW (ref 8.9–10.3)
GFR calc Af Amer: >60 mL/min (ref >60)
GFR calc non Af Amer: >60 mL/min (ref >60)
GLUCOSE: 98 mg/dL (ref 65–99)
POTASSIUM: 4 mmol/L (ref 3.5–5.1)
Sodium: 140 mmol/L (ref 135–145)

## 2016-01-27 LAB — CBC
HEMATOCRIT: 39.1 % (ref 35.0–47.0)
Hemoglobin: 13.3 g/dL (ref 12.0–16.0)
MCH: 31 pg (ref 26.0–34.0)
MCHC: 34 g/dL (ref 32.0–36.0)
MCV: 91.2 fL (ref 80.0–100.0)
PLATELETS: 237 10*3/uL (ref 150–440)
RBC: 4.29 MIL/uL (ref 3.80–5.20)
RDW: 15.4 % — ABNORMAL HIGH (ref 11.5–14.5)
WBC: 10 10*3/uL (ref 3.6–11.0)

## 2016-01-27 SURGERY — AMPUTATION, TOE
Anesthesia: General | Laterality: Left

## 2016-01-27 MED ORDER — DEXTROSE 5 % IV SOLN: 2.0000 g | Freq: Three times a day (TID) | INTRAVENOUS | 0 refills | Status: DC

## 2016-01-27 MED ORDER — OXYCODONE HCL 5 MG PO TABS: 5.0000 mg | ORAL_TABLET | ORAL | 0 refills | Status: DC | PRN

## 2016-01-27 MED ORDER — ACETAMINOPHEN 325 MG PO TABS: 650.0000 mg | ORAL_TABLET | Freq: Four times a day (QID) | ORAL | 0 refills | Status: DC | PRN

## 2016-01-27 MED ORDER — VANCOMYCIN HCL IN DEXTROSE 1-5 GM/200ML-% IV SOLN: 1000.0000 mg | Freq: Two times a day (BID) | INTRAVENOUS | 0 refills | Status: DC

## 2016-01-27 NOTE — Progress Notes (Signed)
Pt asking to take a shower this AM. Dr. Nemiah Commander paged to see if patient can shower and to see if tele can be removed for shower. Per Dr. Nemiah Commander, telemetry can be removed for pt to bathe, but would prefer pt to have bed bath so as to protect integrity of L great toe that is suspected to have osteomyelitis until podiatry is able to assess pt. Pt is agreeable to bed bath. Tele removed for pt to bathe and pt setup with supplies at bedside. Will reconnect tele and IVF once pt has completed bathing.

## 2016-01-27 NOTE — Discharge Summary (Signed)
[employer] [facility] [place] [facility]   PATIENT NAME: Jenna Davis    MR#:  [id number]  DATE OF BIRTH:  11-27-1955  DATE OF ADMISSION:  01/26/2016   ADMITTING PHYSICIAN: Oralia Manis, MD  DATE OF DISCHARGE: 01/27/2016  PRIMARY CARE PHYSICIAN: Patterson Hammersmith, MD   ADMISSION DIAGNOSIS:   Arthritis [M19.90] Acute osteomyelitis of left foot (HCC) [M86.172]  DISCHARGE DIAGNOSIS:   Principal Problem:   Osteomyelitis (HCC) Active Problems:   RA (rheumatoid arthritis) (HCC)   GERD (gastroesophageal reflux disease)   A-fib (HCC)   Depression   SECONDARY DIAGNOSIS:   Past Medical History:  Diagnosis Date  . A-fib (HCC)   . Depression   . GERD (gastroesophageal reflux disease)   . RA (rheumatoid arthritis) (HCC)   . Rheumatoid arthritis [facility])     HOSPITAL COURSE:   60 year old female with past medical history significant for atrial flutter status post ablation on Xarelto, rheumatoid arthritis on Humira, GERD, depression presented to the hospital requested by her rheumatologist secondary to possible septic arthritis/osteomyelitis of her left foot MTP joint.  #1 osteomyelitis of left first toe proximal phalanx-is no open wounds or ulcerations. Denies any trauma. -Noticed swelling and tenderness going on for almost 4 weeks now. X-ray as an outpatient at [facility] showing erosion of the proximal phalanx head. -Repeat x-ray on admission here on 01/26/2016 showing bony destruction and irregularity of the distal aspect of proximal phalanx of left big toe highly suspicious for osteomyelitis or septic arthritis. -Patient is on broad-spectrum antibiotics with vancomycin and cefepime. -Blood cultures are pending at this time. No fevers, normal WBC. -Seen by podiatrist here in the hospital and recommended surgical debridement, however patient and family wanted to be seen at [facility]. -Discussed with hospitalist at [facility] and  also podiatrist Dr. Kaylyn Layer who have kindly accepted this patient. -Continue pain medications as needed.  #2 rheumatoid arthritis-following with a rheumatologist as outpatient. -Continue prednisone, hydroxychloroquine and methotrexate -Also on Humira as outpatient.  #3 GERD-continue PPI  #4 atrial flutter-noted last month, patient was asymptomatic and she was at rheumatology appointment. Seen by cardiology and had ablation done. -TEE at the time showed slightly reduced biventricular EF but her heart rate was elevated when that was done. -She is taking Xarelto as an outpatient which she is currently on hold. She is scheduled to get a transthoracic echo as outpatient in 2 weeks, plan was to stop Xarelto if the echo was normal at the time. Continue follow-up with cardiology as outpatient.  Patient is otherwise medically stable for transfer at this time  DISCHARGE CONDITIONS:   Stable CONSULTS OBTAINED:   Treatment Team:  Linus Galas, DPM  DRUG ALLERGIES:   No Known Allergies DISCHARGE MEDICATIONS:     Medication List    STOP taking these medications   Adalimumab 40 MG/0.8ML Pnkt   ibuprofen 600 MG tablet Commonly known as:  ADVIL,MOTRIN   XARELTO 20 MG Tabs tablet Generic drug:  rivaroxaban     TAKE these medications   acetaminophen 325 MG tablet Commonly known as:  TYLENOL Take 2 tablets (650 mg total) by mouth every 6 (six) hours as needed for mild pain (or Fever >/= 101).   buPROPion 150 MG 12 hr tablet Commonly known as:  WELLBUTRIN SR Take 1 tablet by mouth 2 (two) times daily.   ceFEPIme 2 g in dextrose 5 % 50 mL Inject 2 g into the vein every 8 (eight) hours.  folic acid 1 MG tablet Commonly known as:  FOLVITE Take 1 mg by mouth daily.   hydroxychloroquine 200 MG tablet Commonly known as:  PLAQUENIL Take 1 tablet by mouth 2 (two) times daily.   methotrexate 2.5 MG tablet Commonly known as:  RHEUMATREX Take 8 tablets by mouth once a week. On  wednesdays   omeprazole 20 MG capsule Commonly known as:  PRILOSEC Take 20 mg by mouth daily.   oxyCODONE 5 MG immediate release tablet Commonly known as:  Oxy IR/ROXICODONE Take 1 tablet (5 mg total) by mouth every 4 (four) hours as needed for moderate pain or severe pain.   predniSONE 5 MG tablet Commonly known as:  DELTASONE Take 2 tablets by mouth daily. 10mg  daily until Sunday. Monday begins 5mg  until friday   ranitidine 150 MG tablet Commonly known as:  ZANTAC Take 150 mg by mouth at bedtime. As needed   vancomycin 1-5 GM/200ML-% Soln Commonly known as:  VANCOCIN Inject 200 mLs (1,000 mg total) into the vein every 12 (twelve) hours.        DISCHARGE INSTRUCTIONS:   1. Patient being discharged to [facility] due to family request  DIET:   Cardiac diet  ACTIVITY:   Activity as tolerated  OXYGEN:   Home Oxygen: No.  Oxygen Delivery: room air  DISCHARGE LOCATION:   [facility]   If you experience worsening of your admission symptoms, develop shortness of breath, life threatening emergency, suicidal or homicidal thoughts you must seek medical attention immediately by calling 911 or calling your MD immediately  if symptoms less severe.  You Must read complete instructions/literature along with all the possible adverse reactions/side effects for all the Medicines you take and that have been prescribed to you. Take any new Medicines after you have completely understood and accpet all the possible adverse reactions/side effects.   Please note  You were cared for by a hospitalist during your hospital stay. If you have any questions about your discharge medications or the care you received while you were in the hospital after you are discharged, you can call the unit and asked to speak with the hospitalist on call if the hospitalist that took care of you is not available. Once you are discharged, your primary care physician will handle  any further medical issues. Please note that NO REFILLS for any discharge medications will be authorized once you are discharged, as it is imperative that you return to your primary care physician (or establish a relationship with a primary care physician if you do not have one) for your aftercare needs so that they can reassess your need for medications and monitor your lab values.    On the day of Discharge:  VITAL SIGNS:   Blood pressure 132/75, pulse 78, temperature 97.9 F (36.6 C), temperature source Oral, resp. rate 12, height 5\' 6"  (1.676 m), weight 95.1 kg (209 lb 9.6 oz), SpO2 99 %.  PHYSICAL EXAMINATION:    GENERAL:  60 y.o.-year-old patient lying in the bed with no acute distress.  EYES: Pupils equal, round, reactive to light and accommodation. No scleral icterus. Extraocular muscles intact.  HEENT: Head atraumatic, normocephalic. Oropharynx and nasopharynx clear.  NECK:  Supple, no jugular venous distention. No thyroid enlargement, no tenderness.  LUNGS: Normal breath sounds bilaterally, no wheezing, rales,rhonchi or crepitation. No use of accessory muscles of respiration.  CARDIOVASCULAR: S1, S2 normal. No murmurs, rubs, or gallops.  ABDOMEN: Soft, non-tender, non-distended. Bowel sounds present. No  organomegaly or mass.  EXTREMITIES: No pedal edema, cyanosis, or clubbing. Left foot- no ulcerations or open wounds noted. Swelling and tenderness of the MTP joint of left big toe Same spot tenderness on the right foot as well NEUROLOGIC: Cranial nerves II through XII are intact. Muscle strength 5/5 in all extremities. Sensation intact. Gait not checked.  PSYCHIATRIC: The patient is alert and oriented x 3.  SKIN: No obvious rash, lesion, or ulcer.   DATA REVIEW:   CBC  Recent Labs Lab 01/27/16 0407  WBC 10.0  HGB 13.3  HCT 39.1  PLT 237    Chemistries   Recent Labs Lab 01/26/16 1655 01/27/16 0407  NA 139 140  K 3.9 4.0  CL 109 110  CO2 22 23  GLUCOSE 106* 98    BUN 24* 18  CREATININE 1.11* 0.91  CALCIUM 8.9 8.6*  AST 25  --   ALT 19  --   ALKPHOS 59  --   BILITOT 0.4  --      Microbiology Results  Results for orders placed or performed during the hospital encounter of 01/26/16  Culture, blood (Routine X 2) w Reflex to ID Panel     Status: None (Preliminary result)   Collection Time: 01/26/16 11:13 PM  Result Value Ref Range Status   Specimen Description BLOOD RIGHT HAND  Final   Special Requests BOTTLES DRAWN AEROBIC AND ANAEROBIC 5CC  Final   Culture NO GROWTH < 12 HOURS  Final   Report Status PENDING  Incomplete  Culture, blood (Routine X 2) w Reflex to ID Panel     Status: None (Preliminary result)   Collection Time: 01/26/16 11:25 PM  Result Value Ref Range Status   Specimen Description BLOOD RIGHT FINGER  Final   Special Requests BOTTLES DRAWN AEROBIC AND ANAEROBIC 5CC  Final   Culture NO GROWTH < 12 HOURS  Final   Report Status PENDING  Incomplete    RADIOLOGY:  Dg Toe Great Left  Result Date: 01/26/2016 CLINICAL DATA:  Swelling, pain EXAM: LEFT GREAT TOE COMPARISON:  None. FINDINGS: Three views of the left great toe submitted. There is bone destruction and irregularity distal aspect of proximal phalanx. There is adjacent soft tissue swelling. Findings highly suspicious for osteomyelitis or septic arthritis. Clinical correlation is necessary. Further evaluation with MRI is recommended. IMPRESSION: There is bone destruction and irregularity distal aspect of proximal phalanx. Findings highly suspicious for osteomyelitis or septic arthritis. Clinical correlation is necessary. Further evaluation with MRI is recommended. Electronically Signed   By: Natasha Mead M.D.   On: 01/26/2016 18:03   Dg Toe Great Right  Result Date: 01/26/2016 CLINICAL DATA:  Swelling toe, tender to touch for 4-5 weeks. History of rheumatoid arthritis. EXAM: RIGHT GREAT TOE COMPARISON:  None. FINDINGS: No acute fracture deformity or dislocation. Joint space intact  without erosions. Soft tissue planes are not suspicious. First metatarsal tibial bipartite sesamoid. The first MTP medial soft tissue swelling without subcutaneous gas or radiopaque foreign bodies. Slight erosive changes of the first metatarsal head. IMPRESSION: Medial MTP soft tissue swelling and first metatarsal head erosion concerning for gout. Electronically Signed   By: Awilda Metro M.D.   On: 01/26/2016 19:29     Management plans discussed with the patient, family and they are in agreement.  CODE STATUS:     Code Status Orders        Start     Ordered   01/26/16 2234  Full code  Continuous  01/26/16 2233    Code Status History    Date Active Date Inactive Code Status Order ID Comments User Context   This patient has a current code status but no historical code status.    Advance Directive Documentation   Flowsheet Row Most Recent Value  Type of Advance Directive  Living will  Pre-existing out of facility DNR order (yellow form or pink MOST form)  No data  "MOST" Form in Place?  No data      TOTAL TIME TAKING CARE OF THIS PATIENT: 39 minutes.    Betti Goodenow M.D on 01/27/2016 at 12:12 PM  Between 7am to 6pm - Pager - [phone]  After 6pm go to [email or web address] - Social research officer, government  [facility] [place] [facility]  Office  [phone]  CC: Primary care physician; COLFORD, Moshe Salisbury, MD   Note: This dictation was prepared with Dragon dictation along with smaller phrase technology. Any transcriptional errors that result from this process are unintentional.

## 2016-01-27 NOTE — Progress Notes (Signed)
Pt's sister came to visit pt and was updated about the plan of care by the pt. The sister was very concerned about the decision to amputate the L great toe. Apparently the pt's father had a similar infection in a foot at some point in time, he was treated at a wound center affiliated with [facility], and was able to save most of his foot. For this reason, the pt and sister had a long conversation about the pt's desire to receive treatment here at [facility]. I spoke with both the pt and her sister and explained that based on the doctors' assessments, it was decided amputation would be the necessary treatment. I also explained that she has been requiring IV antibiotics and that they are running tests to determine if the infection has spread to her bloodstream. They have both been informed that the pt is in need of acute medical attention, and that if they do not receive their care here, they should seek medical care at another acute care facility. Ultimately, the patient decided that she would rather be transferred to [facility] in order to be treated by their wound center in hopes of saving her toe. Dr. Alberteen Spindle and Dr. Nemiah Commander updated. Dr. Nemiah Commander is in the process of trying to transfer the pt from this facility to [facility]. The pt is aware that if this is unable to happen, the pt has not been cleared medically to leave this facility and would have to leave against medical advice if she still wishes to go to [facility]. Both the Charge Nurse Maureen Ralphs) and Nursing Supervisor Elnita Maxwell) have been made aware of the situation as well.

## 2016-01-27 NOTE — Progress Notes (Signed)
Dr. Nemiah Commander updated me and the pt has been accepted as a transfer to [facility]. Transfer packet has been printed by unit NS and radiology has provided imaging disc to be sent with packet. I am awaiting a room number and report number. Once I receive that information transport will be called. Pt is aware of situation and is agreeable to transfer hospital to hospital vs. leaving AMA and transporting herself. Will continue to treat patient as ordered via EMR until transfer occurs.

## 2016-01-27 NOTE — Consult Note (Signed)
Reason for Consult: Pain and swelling in the left great toe for the past few weeks. X-rays indicate osteomyelitis or septic joint Referring Physician: [facility] hospitalist  Jenna Davis is an 60 y.o. female.  HPI: The patient relates a just over 1 year history of being diagnosed with rheumatoid arthritis. Had what she felt was a flareup of her arthritis about a month or so ago. Continued to have some significant pain and swelling in her left great toe. Does not relate any specific injury. A couple of weeks ago she was seen in her rheumatologist's office and x-rays were taken that showed some erosions in the proximal phalanx of the great toe suspicious for a septic joint. They were unable to get into contact with the patient over the next couple of weeks. Yesterday they were finally able to establish contact recommended going to an urgent care. Presented to [facility] clinic urgent care and was referred over to the hospital for evaluation and admission.  Past Medical History:  Diagnosis Date  . A-fib ([place])   . Depression   . GERD (gastroesophageal reflux disease)   . RA (rheumatoid arthritis) ([place])   . Rheumatoid arthritis [facility])     Past Surgical History:  Procedure Laterality Date  . ATRIAL FIBRILLATION ABLATION    . OVARIAN CYST REMOVAL      Family History  Problem Relation Age of Onset  . Diabetes Mother   . Breast cancer Mother   . Diabetes Father     Social History:  reports that she has been smoking.  She has never used smokeless tobacco. She reports that she does not drink alcohol or use drugs.  Allergies: No Known Allergies  Medications:  Scheduled: . buPROPion  150 mg Oral BID  . ceFEPime (MAXIPIME) IV  2 g Intravenous Q8H  . hydroxychloroquine  200 mg Oral BID  . pantoprazole  40 mg Oral Daily  . predniSONE  10 mg Oral Daily  . sodium chloride flush  3 mL Intravenous Q12H  . vancomycin  1,000 mg Intravenous Q12H    Results for orders placed or performed during the  hospital encounter of 01/26/16 (from the past 48 hour(s))  CBC with Differential     Status: Abnormal   Collection Time: 01/26/16  4:55 PM  Result Value Ref Range   WBC 12.1 (H) 3.6 - 11.0 K/uL   RBC 4.67 3.80 - 5.20 MIL/uL   Hemoglobin 14.5 12.0 - 16.0 g/dL   HCT 43.4 35.0 - 47.0 %   MCV 92.9 80.0 - 100.0 fL   MCH 31.1 26.0 - 34.0 pg   MCHC 33.5 32.0 - 36.0 g/dL   RDW 15.4 (H) 11.5 - 14.5 %   Platelets 252 150 - 440 K/uL   Neutrophils Relative % 70 %   Neutro Abs 8.5 (H) 1.4 - 6.5 K/uL   Lymphocytes Relative 17 %   Lymphs Abs 2.0 1.0 - 3.6 K/uL   Monocytes Relative 10 %   Monocytes Absolute 1.2 (H) 0.2 - 0.9 K/uL   Eosinophils Relative 2 %   Eosinophils Absolute 0.3 0 - 0.7 K/uL   Basophils Relative 1 %   Basophils Absolute 0.1 0 - 0.1 K/uL  Comprehensive metabolic panel     Status: Abnormal   Collection Time: 01/26/16  4:55 PM  Result Value Ref Range   Sodium 139 135 - 145 mmol/L   Potassium 3.9 3.5 - 5.1 mmol/L   Chloride 109 101 - 111 mmol/L   CO2 22 22 -  32 mmol/L   Glucose, Bld 106 (H) 65 - 99 mg/dL   BUN 24 (H) 6 - 20 mg/dL   Creatinine, Ser 1.11 (H) 0.44 - 1.00 mg/dL   Calcium 8.9 8.9 - 10.3 mg/dL   Total Protein 7.9 6.5 - 8.1 g/dL   Albumin 3.9 3.5 - 5.0 g/dL   AST 25 15 - 41 U/L   ALT 19 14 - 54 U/L   Alkaline Phosphatase 59 38 - 126 U/L   Total Bilirubin 0.4 0.3 - 1.2 mg/dL   GFR calc non Af Amer 54 (L) >60 mL/min   GFR calc Af Amer >60 >60 mL/min    Comment: (NOTE) The eGFR has been calculated using the CKD EPI equation. This calculation has not been validated in all clinical situations. eGFR's persistently <60 mL/min signify possible Chronic Kidney Disease.    Anion gap 8 5 - 15  Culture, blood (Routine X 2) w Reflex to ID Panel     Status: None (Preliminary result)   Collection Time: 01/26/16 11:13 PM  Result Value Ref Range   Specimen Description BLOOD RIGHT HAND    Special Requests BOTTLES DRAWN AEROBIC AND ANAEROBIC 5CC    Culture NO GROWTH < 12  HOURS    Report Status PENDING   Culture, blood (Routine X 2) w Reflex to ID Panel     Status: None (Preliminary result)   Collection Time: 01/26/16 11:25 PM  Result Value Ref Range   Specimen Description BLOOD RIGHT FINGER    Special Requests BOTTLES DRAWN AEROBIC AND ANAEROBIC 5CC    Culture NO GROWTH < 12 HOURS    Report Status PENDING   Basic metabolic panel     Status: Abnormal   Collection Time: 01/27/16  4:07 AM  Result Value Ref Range   Sodium 140 135 - 145 mmol/L   Potassium 4.0 3.5 - 5.1 mmol/L   Chloride 110 101 - 111 mmol/L   CO2 23 22 - 32 mmol/L   Glucose, Bld 98 65 - 99 mg/dL   BUN 18 6 - 20 mg/dL   Creatinine, Ser 0.91 0.44 - 1.00 mg/dL   Calcium 8.6 (L) 8.9 - 10.3 mg/dL   GFR calc non Af Amer >60 >60 mL/min   GFR calc Af Amer >60 >60 mL/min    Comment: (NOTE) The eGFR has been calculated using the CKD EPI equation. This calculation has not been validated in all clinical situations. eGFR's persistently <60 mL/min signify possible Chronic Kidney Disease.    Anion gap 7 5 - 15  CBC     Status: Abnormal   Collection Time: 01/27/16  4:07 AM  Result Value Ref Range   WBC 10.0 3.6 - 11.0 K/uL   RBC 4.29 3.80 - 5.20 MIL/uL   Hemoglobin 13.3 12.0 - 16.0 g/dL   HCT 39.1 35.0 - 47.0 %   MCV 91.2 80.0 - 100.0 fL   MCH 31.0 26.0 - 34.0 pg   MCHC 34.0 32.0 - 36.0 g/dL   RDW 15.4 (H) 11.5 - 14.5 %   Platelets 237 150 - 440 K/uL    Dg Toe Great Left  Result Date: 01/26/2016 CLINICAL DATA:  Swelling, pain EXAM: LEFT GREAT TOE COMPARISON:  None. FINDINGS: Three views of the left great toe submitted. There is bone destruction and irregularity distal aspect of proximal phalanx. There is adjacent soft tissue swelling. Findings highly suspicious for osteomyelitis or septic arthritis. Clinical correlation is necessary. Further evaluation with MRI is recommended. IMPRESSION: There is  bone destruction and irregularity distal aspect of proximal phalanx. Findings highly suspicious  for osteomyelitis or septic arthritis. Clinical correlation is necessary. Further evaluation with MRI is recommended. Electronically Signed   By: Natasha Mead M.D.   On: 01/26/2016 18:03   Dg Toe Great Right  Result Date: 01/26/2016 CLINICAL DATA:  Swelling toe, tender to touch for 4-5 weeks. History of rheumatoid arthritis. EXAM: RIGHT GREAT TOE COMPARISON:  None. FINDINGS: No acute fracture deformity or dislocation. Joint space intact without erosions. Soft tissue planes are not suspicious. First metatarsal tibial bipartite sesamoid. The first MTP medial soft tissue swelling without subcutaneous gas or radiopaque foreign bodies. Slight erosive changes of the first metatarsal head. IMPRESSION: Medial MTP soft tissue swelling and first metatarsal head erosion concerning for gout. Electronically Signed   By: Awilda Metro M.D.   On: 01/26/2016 19:29    Review of Systems  Constitutional: Negative for chills and fever.  HENT: Negative.   Eyes: Negative.   Respiratory: Negative.   Cardiovascular: Negative.   Gastrointestinal: Negative for nausea and vomiting.  Genitourinary: Negative.   Musculoskeletal:       Significant pain in the left great toe for the last few weeks.  denies injury  Skin:       Patient denies any open or draining sore from the left great toe. No history of injections into the joint. He has had significant swelling in the left great toe  Neurological:       Denies any numbness or paresthesias  Endo/Heme/Allergies: Negative.   Psychiatric/Behavioral: Negative.    Blood pressure 130/76, pulse 78, temperature 97.9 F (36.6 C), temperature source Oral, resp. rate 16, height 5\' 6"  (1.676 m), weight 95.1 kg (209 lb 9.6 oz), SpO2 100 %. Physical Exam  Cardiovascular:  DP and PT pulses are fully palpable. Capillary filling time intact  Musculoskeletal:  Some pain on palpation and motion of the left great toe. Some instability on motion of the IPJ. Significant hallux valgus  deformity on the right foot. Muscle mass and tone within normal limits  Neurological:  Protective threshold monofilament wire intact and symmetric. Proprioception intact  Skin:  The skin is warm dry and supple. There is some significant edema in the entire left great toe with some mild hyperpigmentation. No clear abscess or open ulceration.    Assessment/Plan: Assessment: Septic joint with osteomyelitis left great toe proximal phalanx.  Plan: Discussed with the patient that I was not able to review her previous x-rays from the [facility] health care system but that the report of there findings of erosions in the great toe did not sound nearly as extensive as the erosions seen on her x-rays taken yesterday. Discussed with the patient that she has significant erosion of the entire head of the proximal phalanx of her great toe at this point. Due to the continued pain and aggressive x-ray changes discussed and recommended with the patient that she undergo amputation of her left great toe. Discussed risks versus benefits. Possible complications include continued infection or nonhealing of the wound as well as possible phantom pain to the great toe. We will obtain a consent form for amputation of the left great toe. Discussed with the patient either having surgery tomorrow morning versus possibly later on this evening. The patient already had breakfast this morning. I will look at the surgery schedule and plan accordingly and place a diet order for nothing by mouth depending on when surgery can be performed  [facility] 01/27/2016, 9:34  AM

## 2016-01-27 NOTE — Progress Notes (Signed)
Patient is to be transferred to [facility] today. Patient is in no acute distress at this time, and assessment is unchanged from this morning. Patient's IV remains intact with NS running at 75, discharge summary and other paperwork has been added to the transfer packet and is ready to be sent with patient. [facility] has been contacted and they will be sending a truck out shortly. Fleet Contras, RN (receiving RN at [facility]) has received report. EMTALA has been filled out and all belongings are being transferred with pt's family.

## 2016-02-01 LAB — CULTURE, BLOOD (ROUTINE X 2)
CULTURE: NO GROWTH
CULTURE: NO GROWTH

## 2016-11-18 ENCOUNTER — Ambulatory Visit
Admission: RE | Admit: 2016-11-18 | Discharge: 2016-11-18 | Disposition: A | Discharge status: Home / Self Care | Payer: Commercial Managed Care - PPO | Attending: Internal Medicine | Admitting: Internal Medicine

## 2016-11-18 DIAGNOSIS — M069 Rheumatoid arthritis, unspecified: Principal | ICD-10-CM

## 2016-11-18 DIAGNOSIS — N898 Other specified noninflammatory disorders of vagina: Secondary | ICD-10-CM

## 2016-11-18 DIAGNOSIS — F17210 Nicotine dependence, cigarettes, uncomplicated: Secondary | ICD-10-CM

## 2016-11-18 MED ORDER — METRONIDAZOLE 500 MG TABLET
ORAL_TABLET | Freq: Three times a day (TID) | ORAL | 0 refills | 0.00000 days | Status: CP
Start: 2016-11-18 — End: 2016-11-25

## 2016-11-18 MED ORDER — NICOTINE 14 MG/24 HR DAILY TRANSDERMAL PATCH
MEDICATED_PATCH | TRANSDERMAL | 0 refills | 0 days | Status: CP
Start: 2016-11-18 — End: 2016-12-30

## 2016-12-16 ENCOUNTER — Ambulatory Visit
Admission: RE | Admit: 2016-12-16 | Discharge: 2016-12-16 | Disposition: A | Discharge status: Home / Self Care | Payer: Commercial Managed Care - PPO | Attending: Rheumatology | Admitting: Rheumatology

## 2016-12-16 DIAGNOSIS — M0579 Rheumatoid arthritis with rheumatoid factor of multiple sites without organ or systems involvement: Principal | ICD-10-CM

## 2016-12-31 MED ORDER — BUPROPION HCL XL 300 MG 24 HR TABLET, EXTENDED RELEASE
ORAL_TABLET | Freq: Every day | ORAL | 3 refills | 0.00000 days | Status: CP
Start: 2016-12-31 — End: 2017-08-11

## 2017-01-13 ENCOUNTER — Ambulatory Visit
Admission: RE | Admit: 2017-01-13 | Discharge: 2017-01-13 | Payer: Commercial Managed Care - PPO | Admitting: Internal Medicine

## 2017-01-13 DIAGNOSIS — Z87891 Personal history of nicotine dependence: Secondary | ICD-10-CM

## 2017-01-13 DIAGNOSIS — D899 Disorder involving the immune mechanism, unspecified: Secondary | ICD-10-CM

## 2017-01-13 DIAGNOSIS — K219 Gastro-esophageal reflux disease without esophagitis: Secondary | ICD-10-CM

## 2017-01-13 DIAGNOSIS — M05772 Rheumatoid arthritis with rheumatoid factor of left ankle and foot without organ or systems involvement: Secondary | ICD-10-CM

## 2017-01-13 DIAGNOSIS — M05771 Rheumatoid arthritis with rheumatoid factor of right ankle and foot without organ or systems involvement: Principal | ICD-10-CM

## 2017-02-06 ENCOUNTER — Ambulatory Visit
Admission: RE | Admit: 2017-02-06 | Discharge: 2017-02-06 | Disposition: A | Discharge status: Home / Self Care | Payer: Commercial Managed Care - PPO | Attending: Internal Medicine | Admitting: Internal Medicine

## 2017-02-06 DIAGNOSIS — Z87891 Personal history of nicotine dependence: Secondary | ICD-10-CM

## 2017-02-06 DIAGNOSIS — R8290 Unspecified abnormal findings in urine: Principal | ICD-10-CM

## 2017-02-08 MED ORDER — NITROFURANTOIN MONOHYDRATE/MACROCRYSTALS 100 MG CAPSULE
ORAL_CAPSULE | Freq: Two times a day (BID) | ORAL | 0 refills | 0 days | Status: CP
Start: 2017-02-08 — End: 2017-02-13

## 2017-02-13 ENCOUNTER — Ambulatory Visit: Admission: RE | Admit: 2017-02-13 | Discharge: 2017-02-13 | Payer: Commercial Managed Care - PPO

## 2017-02-13 DIAGNOSIS — Z0000 Encounter for general adult medical examination without abnormal findings: Secondary | ICD-10-CM

## 2017-02-13 DIAGNOSIS — J449 Chronic obstructive pulmonary disease, unspecified: Secondary | ICD-10-CM

## 2017-02-13 DIAGNOSIS — Z87891 Personal history of nicotine dependence: Principal | ICD-10-CM

## 2017-02-21 MED ORDER — PREDNISONE 10 MG TABLET
ORAL_TABLET | Freq: Every day | ORAL | 2 refills | 0.00000 days | Status: CP
Start: 2017-02-21 — End: 2017-04-08

## 2017-03-17 MED ORDER — XELJANZ 5 MG TABLET
ORAL_TABLET | 3 refills | 0 days | Status: CP
Start: 2017-03-17 — End: 2017-07-07

## 2017-03-27 MED ORDER — NAPROXEN 500 MG TABLET
ORAL_TABLET | Freq: Two times a day (BID) | ORAL | 2 refills | 0 days | Status: CP
Start: 2017-03-27 — End: 2017-07-22

## 2017-04-08 MED ORDER — PREDNISONE 10 MG TABLET
ORAL_TABLET | Freq: Every day | ORAL | 2 refills | 0 days | Status: CP
Start: 2017-04-08 — End: 2017-08-05

## 2017-04-14 MED ORDER — FOLIC ACID 1 MG TABLET
ORAL_TABLET | Freq: Every day | ORAL | 6 refills | 0 days | Status: CP
Start: 2017-04-14 — End: 2017-11-03

## 2017-04-14 MED ORDER — METHOTREXATE SODIUM 2.5 MG TABLET
ORAL_TABLET | ORAL | 0 refills | 0.00000 days | Status: CP
Start: 2017-04-14 — End: 2017-05-27

## 2017-05-05 MED ORDER — ASPIRIN 81 MG CHEWABLE TABLET
ORAL_TABLET | Freq: Every day | ORAL | 2 refills | 0 days | Status: CP
Start: 2017-05-05 — End: 2018-01-20

## 2017-05-07 ENCOUNTER — Ambulatory Visit: Admission: RE | Admit: 2017-05-07 | Discharge: 2017-05-07

## 2017-05-07 DIAGNOSIS — H5213 Myopia, bilateral: Secondary | ICD-10-CM

## 2017-05-07 DIAGNOSIS — T378X5S Adverse effect of other specified systemic anti-infectives and antiparasitics, sequela: Principal | ICD-10-CM

## 2017-05-07 DIAGNOSIS — M069 Rheumatoid arthritis, unspecified: Secondary | ICD-10-CM

## 2017-05-07 DIAGNOSIS — H52203 Unspecified astigmatism, bilateral: Secondary | ICD-10-CM

## 2017-05-07 DIAGNOSIS — H2513 Age-related nuclear cataract, bilateral: Secondary | ICD-10-CM

## 2017-05-27 ENCOUNTER — Other Ambulatory Visit: Admit: 2017-05-27 | Discharge: 2017-05-28 | Payer: BLUE CROSS/BLUE SHIELD

## 2017-05-27 DIAGNOSIS — M059 Rheumatoid arthritis with rheumatoid factor, unspecified: Principal | ICD-10-CM

## 2017-05-27 MED ORDER — METHOTREXATE SODIUM 2.5 MG TABLET
ORAL_TABLET | ORAL | 3 refills | 0 days | Status: CP
Start: 2017-05-27 — End: 2017-07-01

## 2017-07-01 ENCOUNTER — Encounter
Admit: 2017-07-01 | Discharge: 2017-07-02 | Disposition: A | Discharge status: Home / Self Care | Payer: BLUE CROSS/BLUE SHIELD | Attending: Emergency Medicine

## 2017-07-01 MED ORDER — METHOTREXATE SODIUM 2.5 MG TABLET
ORAL_TABLET | ORAL | 3 refills | 0.00000 days | Status: CP
Start: 2017-07-01 — End: 2017-10-03

## 2017-07-01 MED ORDER — ONDANSETRON 4 MG DISINTEGRATING TABLET
ORAL_TABLET | Freq: Three times a day (TID) | ORAL | 0 refills | 0 days | Status: CP | PRN
Start: 2017-07-01 — End: 2017-07-09

## 2017-07-07 DIAGNOSIS — M0579 Rheumatoid arthritis with rheumatoid factor of multiple sites without organ or systems involvement: Principal | ICD-10-CM

## 2017-07-08 ENCOUNTER — Ambulatory Visit
Admit: 2017-07-08 | Discharge: 2017-07-13 | Disposition: A | Discharge status: Home / Self Care | Payer: BLUE CROSS/BLUE SHIELD

## 2017-07-08 ENCOUNTER — Ambulatory Visit
Admit: 2017-07-08 | Discharge: 2017-07-13 | Disposition: A | Discharge status: Home / Self Care | Payer: BLUE CROSS/BLUE SHIELD | Attending: Rheumatology

## 2017-07-08 DIAGNOSIS — N10 Acute tubulo-interstitial nephritis: Principal | ICD-10-CM

## 2017-07-12 MED ORDER — NICOTINE (POLACRILEX) 4 MG GUM: 4 mg | each | 0 refills | 0 days | Status: AC

## 2017-07-12 MED ORDER — TRAMADOL 50 MG TABLET: 50 mg | tablet | Freq: Four times a day (QID) | 0 refills | 0 days | Status: AC

## 2017-07-12 MED ORDER — TRAMADOL 50 MG TABLET
ORAL_TABLET | Freq: Four times a day (QID) | ORAL | 0 refills | 0.00000 days | Status: CP | PRN
Start: 2017-07-12 — End: 2017-07-12

## 2017-07-12 MED ORDER — ACETAMINOPHEN 325 MG TABLET
ORAL | 0 refills | 0.00000 days | PRN
Start: 2017-07-12 — End: 2017-11-05

## 2017-07-12 MED ORDER — NICOTINE (POLACRILEX) 4 MG GUM
BUCCAL | 0 refills | 0.00000 days | Status: CP | PRN
Start: 2017-07-12 — End: 2017-07-12

## 2017-07-12 MED ORDER — CEFDINIR 300 MG CAPSULE
ORAL_CAPSULE | 0 refills | 0 days
Start: 2017-07-12 — End: 2018-03-11

## 2017-07-12 MED ORDER — NICOTINE 14 MG/24 HR DAILY TRANSDERMAL PATCH
0 refills | 0 days
Start: 2017-07-12 — End: 2017-07-12

## 2017-07-12 MED FILL — TRAMADOL HCL/50MG/TABS: TRAMADOL HCL/50MG/TABS | 5 days supply | Qty: 20 | Fill #0

## 2017-07-12 MED FILL — CEFDINIR/300MG/CAPS: CEFDINIR/300MG/CAPS | 5 days supply | Qty: 10 | Fill #0

## 2017-07-13 MED ORDER — NICOTINE 14 MG/24 HR DAILY TRANSDERMAL PATCH
MEDICATED_PATCH | Freq: Every day | TRANSDERMAL | 0 refills | 0.00000 days | Status: CP
Start: 2017-07-13 — End: 2017-11-03

## 2017-07-13 MED ORDER — CEFDINIR 300 MG CAPSULE
ORAL_CAPSULE | Freq: Two times a day (BID) | ORAL | 0 refills | 0.00000 days | Status: CP
Start: 2017-07-13 — End: 2017-07-13

## 2017-07-22 ENCOUNTER — Encounter
Admit: 2017-07-22 | Discharge: 2017-07-23 | Payer: BLUE CROSS/BLUE SHIELD | Attending: Internal Medicine | Primary: Internal Medicine

## 2017-07-22 DIAGNOSIS — K219 Gastro-esophageal reflux disease without esophagitis: Secondary | ICD-10-CM

## 2017-07-22 DIAGNOSIS — N12 Tubulo-interstitial nephritis, not specified as acute or chronic: Secondary | ICD-10-CM

## 2017-07-22 DIAGNOSIS — M0579 Rheumatoid arthritis with rheumatoid factor of multiple sites without organ or systems involvement: Secondary | ICD-10-CM

## 2017-07-22 DIAGNOSIS — F17200 Nicotine dependence, unspecified, uncomplicated: Secondary | ICD-10-CM

## 2017-07-22 DIAGNOSIS — Z09 Encounter for follow-up examination after completed treatment for conditions other than malignant neoplasm: Principal | ICD-10-CM

## 2017-07-22 MED ORDER — MELOXICAM 7.5 MG TABLET
ORAL_TABLET | Freq: Every day | ORAL | 0 refills | 0 days | Status: CP
Start: 2017-07-22 — End: 2017-08-26

## 2017-07-22 MED ORDER — OMEPRAZOLE 20 MG CAPSULE,DELAYED RELEASE
ORAL_CAPSULE | Freq: Two times a day (BID) | ORAL | 0 refills | 0.00000 days | Status: CP
Start: 2017-07-22 — End: 2017-11-03

## 2017-07-22 MED ORDER — DICLOFENAC 1 % TOPICAL GEL
Freq: Four times a day (QID) | TOPICAL | 0 refills | 0 days | Status: CP
Start: 2017-07-22 — End: 2017-09-15

## 2017-07-31 MED ORDER — HYDROXYCHLOROQUINE 200 MG TABLET
ORAL_TABLET | Freq: Two times a day (BID) | ORAL | 1 refills | 0.00000 days | Status: CP
Start: 2017-07-31 — End: 2018-05-28

## 2017-08-05 MED ORDER — PREDNISONE 10 MG TABLET
ORAL_TABLET | 2 refills | 0 days | Status: CP
Start: 2017-08-05 — End: 2017-10-01

## 2017-08-11 MED ORDER — BUPROPION HCL XL 300 MG 24 HR TABLET, EXTENDED RELEASE
ORAL_TABLET | Freq: Every day | ORAL | 3 refills | 0.00000 days | Status: CP
Start: 2017-08-11 — End: 2017-11-03

## 2017-08-26 MED ORDER — MELOXICAM 7.5 MG TABLET
ORAL_TABLET | Freq: Every day | ORAL | 0 refills | 0 days | Status: CP
Start: 2017-08-26 — End: 2017-09-26

## 2017-09-15 ENCOUNTER — Encounter: Admit: 2017-09-15 | Discharge: 2017-09-15 | Payer: BLUE CROSS/BLUE SHIELD

## 2017-09-15 DIAGNOSIS — J449 Chronic obstructive pulmonary disease, unspecified: Secondary | ICD-10-CM

## 2017-09-15 DIAGNOSIS — M0579 Rheumatoid arthritis with rheumatoid factor of multiple sites without organ or systems involvement: Secondary | ICD-10-CM

## 2017-09-15 DIAGNOSIS — N12 Tubulo-interstitial nephritis, not specified as acute or chronic: Principal | ICD-10-CM

## 2017-09-15 DIAGNOSIS — F17200 Nicotine dependence, unspecified, uncomplicated: Secondary | ICD-10-CM

## 2017-09-17 MED ORDER — ETANERCEPT 25 MG/0.5 ML (0.5 ML) SUBCUTANEOUS SYRINGE: 50 mg | Syringe | 3 refills | 0 days | Status: AC

## 2017-09-17 MED ORDER — ETANERCEPT 25 MG/0.5 ML (0.5 ML) SUBCUTANEOUS SYRINGE
INJECTION | SUBCUTANEOUS | 3 refills | 0.00000 days | Status: CP
Start: 2017-09-17 — End: 2017-10-01

## 2017-09-25 ENCOUNTER — Encounter: Payer: Self-pay | Admitting: Emergency Medicine

## 2017-09-25 ENCOUNTER — Other Ambulatory Visit: Payer: Self-pay

## 2017-09-25 ENCOUNTER — Emergency Department
Admission: EM | Admit: 2017-09-25 | Discharge: 2017-09-25 | Disposition: A | Discharge status: Home / Self Care | Payer: Commercial Managed Care - HMO | Attending: Emergency Medicine | Admitting: Emergency Medicine

## 2017-09-25 DIAGNOSIS — M069 Rheumatoid arthritis, unspecified: Secondary | ICD-10-CM | POA: Diagnosis not present

## 2017-09-25 DIAGNOSIS — F17200 Nicotine dependence, unspecified, uncomplicated: Secondary | ICD-10-CM | POA: Diagnosis not present

## 2017-09-25 DIAGNOSIS — Z79899 Other long term (current) drug therapy: Secondary | ICD-10-CM | POA: Diagnosis not present

## 2017-09-25 DIAGNOSIS — M79641 Pain in right hand: Secondary | ICD-10-CM | POA: Diagnosis present

## 2017-09-25 MED ORDER — HYDROCODONE-ACETAMINOPHEN 5-325 MG PO TABS: 1.0000 | ORAL_TABLET | Freq: Four times a day (QID) | ORAL | 0 refills | Status: DC | PRN

## 2017-09-25 MED ORDER — IBUPROFEN 600 MG PO TABS
600.0000 mg | ORAL_TABLET | Freq: Once | ORAL | Status: AC
  Administered 2017-09-25: 600 mg via ORAL
  Filled 2017-09-25: qty 1

## 2017-09-25 MED ORDER — PREDNISONE 50 MG PO TABS: 50.0000 mg | ORAL_TABLET | Freq: Every day | ORAL | 0 refills | Status: AC

## 2017-09-25 MED ORDER — PREDNISONE 20 MG PO TABS
60.0000 mg | ORAL_TABLET | Freq: Once | ORAL | Status: AC
  Administered 2017-09-25: 60 mg via ORAL
  Filled 2017-09-25: qty 3

## 2017-09-25 MED ORDER — IBUPROFEN 600 MG PO TABS: 600.0000 mg | ORAL_TABLET | Freq: Three times a day (TID) | ORAL | 0 refills | Status: DC | PRN

## 2017-09-25 MED ORDER — HYDROCODONE-ACETAMINOPHEN 5-325 MG PO TABS
1.0000 | ORAL_TABLET | Freq: Once | ORAL | Status: AC
  Administered 2017-09-25: 1 via ORAL
  Filled 2017-09-25: qty 1

## 2017-09-25 NOTE — ED Triage Notes (Signed)
Pt c/o left hand pain and reports the pain is the same as her arthritis pain but is the worst its ever been and is not relieved by OTC pain medication. Pt denies injury.

## 2017-09-25 NOTE — ED Provider Notes (Signed)
[facility] Emergency Department Provider Note  ____________________________________________   First MD Initiated Contact with Patient 09/25/17 [phone]     (approximate)  I have reviewed the triage vital signs and the nursing notes.   HISTORY  Chief Complaint Hand Pain   HPI Jenna Davis is a 62 y.o. female who self presents to the emergency department with several days of progressive throbbing aching discomfort in bilateral wrist.  She has a long-standing history of rheumatoid arthritis and had previously been taking Humira but was recently taken off by her rheumatologist as it does not seem to be working well.  Ever since stopping the Humira her pain is been worse.  It is throbbing aching moderate to severe worse with movement somewhat improved with rest.  She still does take Plaquenil as well as prednisone.  She denies fevers or chills.  Past Medical History:  Diagnosis Date  . A-fib (HCC)   . Depression   . GERD (gastroesophageal reflux disease)   . RA (rheumatoid arthritis) (HCC)   . Rheumatoid arthritis [facility])     Patient Active Problem List   Diagnosis Date Noted  . Osteomyelitis (HCC) 01/26/2016  . RA (rheumatoid arthritis) (HCC) 01/26/2016  . GERD (gastroesophageal reflux disease) 01/26/2016  . A-fib (HCC) 01/26/2016  . Depression 01/26/2016    Past Surgical History:  Procedure Laterality Date  . ATRIAL FIBRILLATION ABLATION    . OVARIAN CYST REMOVAL      Prior to Admission medications   Medication Sig Start Date End Date Taking? Authorizing Provider  acetaminophen (TYLENOL) 325 MG tablet Take 2 tablets (650 mg total) by mouth every 6 (six) hours as needed for mild pain (or Fever >/= 101). 01/27/16   Enid Baas, MD  buPROPion (WELLBUTRIN SR) 150 MG 12 hr tablet Take 1 tablet by mouth 2 (two) times daily. 01/12/16   [provider]  ceFEPIme 2 g in dextrose 5 % 50 mL Inject 2 g into the vein every 8 (eight) hours. 01/27/16    Enid Baas, MD  folic acid (FOLVITE) 1 MG tablet Take 1 mg by mouth daily. 01/04/15   [provider]  HYDROcodone-acetaminophen (NORCO) 5-325 MG tablet Take 1 tablet by mouth every 6 (six) hours as needed for up to 7 doses for severe pain. 09/25/17   Merrily Brittle, MD  hydroxychloroquine (PLAQUENIL) 200 MG tablet Take 1 tablet by mouth 2 (two) times daily. 01/22/16   [provider]  ibuprofen (ADVIL,MOTRIN) 600 MG tablet Take 1 tablet (600 mg total) by mouth every 8 (eight) hours as needed. 09/25/17   Merrily Brittle, MD  methotrexate (RHEUMATREX) 2.5 MG tablet Take 8 tablets by mouth once a week. On wednesdays 12/26/15   [provider]  omeprazole (PRILOSEC) 20 MG capsule Take 20 mg by mouth daily. 01/23/16   [provider]  oxyCODONE (OXY IR/ROXICODONE) 5 MG immediate release tablet Take 1 tablet (5 mg total) by mouth every 4 (four) hours as needed for moderate pain or severe pain. 01/27/16   Enid Baas, MD  predniSONE (DELTASONE) 50 MG tablet Take 1 tablet (50 mg total) by mouth daily for 4 days. 09/25/17 09/29/17  Merrily Brittle, MD  ranitidine (ZANTAC) 150 MG tablet Take 150 mg by mouth at bedtime. As needed 03/13/15   [provider]  vancomycin (VANCOCIN) 1-5 GM/200ML-% SOLN Inject 200 mLs (1,000 mg total) into the vein every 12 (twelve) hours. 01/27/16   Enid Baas, MD    Allergies Patient has no known  allergies.  Family History  Problem Relation Age of Onset  . Diabetes Mother   . Breast cancer Mother   . Diabetes Father     Social History Social History   Tobacco Use  . Smoking status: Current Every Day Smoker  . Smokeless tobacco: Never Used  Substance Use Topics  . Alcohol use: No  . Drug use: No    Review of Systems Constitutional: No fever/chills ENT: No sore throat. Cardiovascular: Denies chest pain. Respiratory: Denies shortness of breath. Gastrointestinal: No abdominal pain.  No nausea, no vomiting.  No  diarrhea.  No constipation. Musculoskeletal: Positive for wrist pain. Neurological: Negative for headaches   ____________________________________________   PHYSICAL EXAM:  VITAL SIGNS: ED Triage Vitals  Enc Vitals Group     BP 09/25/17 0022 (!) 151/96     Pulse Rate 09/25/17 0022 99     Resp 09/25/17 0022 20     Temp 09/25/17 0022 97.7 F (36.5 C)     Temp Source 09/25/17 0022 Oral     SpO2 09/25/17 0022 97 %     Weight 09/25/17 0025 209 lb (94.8 kg)     Height --      Head Circumference --      Peak Flow --      Pain Score --      Pain Loc --      Pain Edu? --      Excl. in GC? --     Constitutional: Alert and oriented x4 obviously uncomfortable rubbing her wrists Head: Atraumatic. Nose: No congestion/rhinnorhea. Mouth/Throat: No trismus Neck: No stridor.   Cardiovascular: Regular rate and rhythm Respiratory: Normal respiratory effort.  No retractions. MSK: Bilateral wrist slightly swollen and diffusely tender although neurovascularly intact Neurologic:  Normal speech and language. No gross focal neurologic deficits are appreciated.  Skin:  Skin is warm, dry and intact. No rash noted.    ____________________________________________  LABS (all labs ordered are listed, but only abnormal results are displayed)  Labs Reviewed - No data to display   __________________________________________  EKG   ____________________________________________  RADIOLOGY   ____________________________________________   DIFFERENTIAL includes but not limited to  Rheumatoid arthritis flare, gouty arthritis, septic arthritis, fracture   PROCEDURES  Procedure(s) performed: no  Procedures  Critical Care performed: no  Observation: no ____________________________________________   INITIAL IMPRESSION / ASSESSMENT AND PLAN / ED COURSE  Pertinent labs & imaging results that were available during my care of the patient were reviewed by me and considered in my medical  decision making (see chart for details).  The patient's symptoms are most consistent with rheumatoid arthritis flare.  Given 60 mg of prednisone along with Norco and ibuprofen now we will reevaluate.     ----------------------------------------- 5:59 AM on 09/25/2017 -----------------------------------------  The patient's pain is improved.  She has an appointment to follow-up with her rheumatologist this coming week.  I will cover her with several more days of prednisone to get her through this acute setting.  Strict return precautions and given the patient verbalizes understanding agreement the plan. ____________________________________________   FINAL CLINICAL IMPRESSION(S) / ED DIAGNOSES  Final diagnoses:  Rheumatoid arthritis flare (HCC)      NEW MEDICATIONS STARTED DURING THIS VISIT:  Discharge Medication List as of 09/25/2017  5:59 AM    START taking these medications   Details  HYDROcodone-acetaminophen (NORCO) 5-325 MG tablet Take 1 tablet by mouth every 6 (six) hours as needed for up to 7 doses for severe pain., Starting  Thu 09/25/2017, Print    ibuprofen (ADVIL,MOTRIN) 600 MG tablet Take 1 tablet (600 mg total) by mouth every 8 (eight) hours as needed., Starting Thu 09/25/2017, Print         Note:  This document was prepared using Dragon voice recognition software and may include unintentional dictation errors.      Merrily Brittle, MD 09/28/17 0127

## 2017-09-25 NOTE — Discharge Instructions (Signed)
It was a pleasure to take care of you today, and thank you for coming to our emergency department.  If you have any questions or concerns before leaving please ask the nurse to grab me and I'm more than happy to go through your aftercare instructions again. ° °If you were prescribed any opioid pain medication today such as Norco, Vicodin, Percocet, morphine, hydrocodone, or oxycodone please make sure you do not drive when you are taking this medication as it can alter your ability to drive safely. ° °If you have any concerns once you are home that you are not improving or are in fact getting worse before you can make it to your follow-up appointment, please do not hesitate to call 911 and come back for further evaluation. ° °Breken Nazari, MD ° ° ° °

## 2017-09-26 MED ORDER — MELOXICAM 7.5 MG TABLET
ORAL_TABLET | 0 refills | 0 days | Status: CP
Start: 2017-09-26 — End: 2017-11-03

## 2017-10-01 ENCOUNTER — Ambulatory Visit
Admit: 2017-10-01 | Discharge: 2017-10-02 | Payer: BLUE CROSS/BLUE SHIELD | Attending: Rheumatology | Primary: Rheumatology

## 2017-10-01 DIAGNOSIS — M0579 Rheumatoid arthritis with rheumatoid factor of multiple sites without organ or systems involvement: Principal | ICD-10-CM

## 2017-10-01 MED ORDER — CERTOLIZUMAB PEGOL 400 MG/2 ML (200 MG/ML X2) SUBCUTANEOUS SYRINGE KIT: each | 99 refills | 0 days

## 2017-10-01 MED ORDER — PREDNISONE 10 MG TABLET
ORAL_TABLET | 2 refills | 0 days | Status: CP
Start: 2017-10-01 — End: 2017-11-03

## 2017-10-01 MED ORDER — CERTOLIZUMAB PEGOL 400 MG/2 ML (200 MG/ML X2) SUBCUTANEOUS SYRINGE KIT
SUBCUTANEOUS | 99 refills | 0.00000 days | Status: CP
Start: 2017-10-01 — End: 2018-04-14

## 2017-10-01 MED ORDER — CERTOLIZUMAB PEGOL 400 MG/2 ML (200 MG/ML X2) SUBCUTANEOUS SYRINGE KIT: 200 mg | each | 3 refills | 0 days | Status: AC

## 2017-10-01 MED ORDER — CERTOLIZUMAB PEGOL 400 MG/2 ML (200 MG/ML X2) SUBCUTANEOUS SYRINGE KIT: kit | 3 refills | 0 days

## 2017-10-03 MED ORDER — CERTOLIZUMAB PEGOL 400 MG/2 ML (200 MG/ML X2) SUBCUTANEOUS SYRINGE KIT: mL | 0 refills | 0 days | Status: AC

## 2017-10-03 MED ORDER — CERTOLIZUMAB PEGOL 400 MG/2 ML (200 MG/ML X2) SUBCUTANEOUS SYRINGE KIT
SUBCUTANEOUS | 0 refills | 0.00000 days | Status: CP
Start: 2017-10-03 — End: 2018-03-11

## 2017-10-15 MED ORDER — METHOTREXATE SODIUM 2.5 MG TABLET
ORAL_TABLET | ORAL | 3 refills | 0 days | Status: CP
Start: 2017-10-15 — End: 2018-05-28

## 2017-10-23 ENCOUNTER — Encounter: Admit: 2017-10-23 | Discharge: 2017-10-24 | Payer: BLUE CROSS/BLUE SHIELD

## 2017-10-23 DIAGNOSIS — R112 Nausea with vomiting, unspecified: Principal | ICD-10-CM

## 2017-10-24 MED ORDER — ONDANSETRON 4 MG DISINTEGRATING TABLET
ORAL_TABLET | Freq: Three times a day (TID) | ORAL | 0 refills | 0.00000 days | Status: CP | PRN
Start: 2017-10-24 — End: 2018-05-25

## 2017-10-25 ENCOUNTER — Encounter
Admit: 2017-10-25 | Discharge: 2017-10-25 | Disposition: A | Discharge status: Home / Self Care | Payer: BLUE CROSS/BLUE SHIELD

## 2017-10-25 DIAGNOSIS — R1030 Lower abdominal pain, unspecified: Principal | ICD-10-CM

## 2017-10-25 MED ORDER — PREDNISONE 10 MG TABLET
ORAL_TABLET | Freq: Every day | ORAL | 0 refills | 0.00000 days | Status: CP
Start: 2017-10-25 — End: 2017-11-03

## 2017-11-03 ENCOUNTER — Encounter: Admit: 2017-11-03 | Discharge: 2017-11-03 | Payer: BLUE CROSS/BLUE SHIELD

## 2017-11-03 ENCOUNTER — Encounter
Admit: 2017-11-03 | Discharge: 2017-11-03 | Payer: BLUE CROSS/BLUE SHIELD | Attending: Internal Medicine | Primary: Internal Medicine

## 2017-11-03 DIAGNOSIS — F332 Major depressive disorder, recurrent severe without psychotic features: Secondary | ICD-10-CM

## 2017-11-03 DIAGNOSIS — F419 Anxiety disorder, unspecified: Secondary | ICD-10-CM

## 2017-11-03 DIAGNOSIS — Z1239 Encounter for other screening for malignant neoplasm of breast: Principal | ICD-10-CM

## 2017-11-03 DIAGNOSIS — Z09 Encounter for follow-up examination after completed treatment for conditions other than malignant neoplasm: Principal | ICD-10-CM

## 2017-11-03 DIAGNOSIS — M05772 Rheumatoid arthritis with rheumatoid factor of left ankle and foot without organ or systems involvement: Secondary | ICD-10-CM

## 2017-11-03 DIAGNOSIS — D899 Disorder involving the immune mechanism, unspecified: Secondary | ICD-10-CM

## 2017-11-03 DIAGNOSIS — N12 Tubulo-interstitial nephritis, not specified as acute or chronic: Principal | ICD-10-CM

## 2017-11-03 DIAGNOSIS — R740 Nonspecific elevation of levels of transaminase and lactic acid dehydrogenase [LDH]: Secondary | ICD-10-CM

## 2017-11-03 DIAGNOSIS — F17200 Nicotine dependence, unspecified, uncomplicated: Secondary | ICD-10-CM

## 2017-11-03 DIAGNOSIS — M0579 Rheumatoid arthritis with rheumatoid factor of multiple sites without organ or systems involvement: Secondary | ICD-10-CM

## 2017-11-03 DIAGNOSIS — R112 Nausea with vomiting, unspecified: Secondary | ICD-10-CM

## 2017-11-03 DIAGNOSIS — M05771 Rheumatoid arthritis with rheumatoid factor of right ankle and foot without organ or systems involvement: Secondary | ICD-10-CM

## 2017-11-03 DIAGNOSIS — K219 Gastro-esophageal reflux disease without esophagitis: Secondary | ICD-10-CM

## 2017-11-03 MED ORDER — NICOTINE 14 MG/24 HR DAILY TRANSDERMAL PATCH
MEDICATED_PATCH | Freq: Every day | TRANSDERMAL | 0 refills | 0.00000 days | Status: CP
Start: 2017-11-03 — End: 2018-05-28

## 2017-11-03 MED ORDER — FOLIC ACID 1 MG TABLET
ORAL_TABLET | Freq: Every day | ORAL | 6 refills | 0 days | Status: CP
Start: 2017-11-03 — End: 2019-01-01

## 2017-11-03 MED ORDER — OMEPRAZOLE 20 MG CAPSULE,DELAYED RELEASE
ORAL_CAPSULE | Freq: Two times a day (BID) | ORAL | 3 refills | 0 days | Status: CP
Start: 2017-11-03 — End: 2017-11-05

## 2017-11-03 MED ORDER — PREDNISONE 10 MG TABLET
ORAL_TABLET | 3 refills | 0 days | Status: CP
Start: 2017-11-03 — End: 2017-11-05

## 2017-11-03 MED ORDER — POTASSIUM CHLORIDE ER 20 MEQ TABLET,EXTENDED RELEASE(PART/CRYST)
ORAL_TABLET | Freq: Two times a day (BID) | ORAL | 0 refills | 0.00000 days | Status: CP
Start: 2017-11-03 — End: 2017-11-17

## 2017-11-03 MED ORDER — MELOXICAM 7.5 MG TABLET
ORAL_TABLET | Freq: Every day | ORAL | 2 refills | 0 days | Status: CP | PRN
Start: 2017-11-03 — End: 2017-11-03

## 2017-11-03 MED ORDER — BUPROPION HCL XL 300 MG 24 HR TABLET, EXTENDED RELEASE
ORAL_TABLET | Freq: Every day | ORAL | 3 refills | 0 days | Status: CP
Start: 2017-11-03 — End: 2017-11-05

## 2017-11-03 MED ORDER — CEFDINIR 300 MG CAPSULE
ORAL_CAPSULE | Freq: Two times a day (BID) | ORAL | 0 refills | 0 days | Status: CP
Start: 2017-11-03 — End: 2017-11-05

## 2017-11-05 ENCOUNTER — Ambulatory Visit: Admit: 2017-11-05 | Discharge: 2017-11-05 | Payer: BLUE CROSS/BLUE SHIELD

## 2017-11-05 ENCOUNTER — Encounter: Admit: 2017-11-05 | Discharge: 2017-11-05 | Payer: BLUE CROSS/BLUE SHIELD

## 2017-11-05 DIAGNOSIS — R112 Nausea with vomiting, unspecified: Secondary | ICD-10-CM

## 2017-11-05 DIAGNOSIS — N179 Acute kidney failure, unspecified: Secondary | ICD-10-CM

## 2017-11-05 DIAGNOSIS — N12 Tubulo-interstitial nephritis, not specified as acute or chronic: Secondary | ICD-10-CM

## 2017-11-05 DIAGNOSIS — M0579 Rheumatoid arthritis with rheumatoid factor of multiple sites without organ or systems involvement: Principal | ICD-10-CM

## 2017-11-05 DIAGNOSIS — F332 Major depressive disorder, recurrent severe without psychotic features: Secondary | ICD-10-CM

## 2017-11-05 MED ORDER — PREDNISONE 10 MG TABLET: tablet | 5 refills | 0 days

## 2017-11-05 MED ORDER — CEFDINIR 300 MG CAPSULE
ORAL_CAPSULE | Freq: Two times a day (BID) | ORAL | 0 refills | 0.00000 days | Status: CP
Start: 2017-11-05 — End: 2017-11-17

## 2017-11-05 MED ORDER — BUPROPION HCL XL 300 MG 24 HR TABLET, EXTENDED RELEASE: 300 mg | tablet | Freq: Every day | 3 refills | 0 days | Status: AC

## 2017-11-05 MED ORDER — PREDNISONE 10 MG TABLET
ORAL_TABLET | ORAL | 5 refills | 0.00000 days | Status: CP
Start: 2017-11-05 — End: 2017-12-26

## 2017-11-05 MED ORDER — ACETAMINOPHEN 650 MG RECTAL SUPPOSITORY
RECTAL | 0 refills | 0 days | Status: CP | PRN
Start: 2017-11-05 — End: 2018-07-22

## 2017-11-05 MED ORDER — BUPROPION HCL XL 300 MG 24 HR TABLET, EXTENDED RELEASE
Freq: Every day | ORAL | 10 refills | 0.00000 days | Status: CP
Start: 2017-11-05 — End: 2018-05-28

## 2017-11-05 MED ORDER — OMEPRAZOLE 20 MG CAPSULE,DELAYED RELEASE
ORAL_CAPSULE | Freq: Every day | ORAL | 3 refills | 0.00000 days | Status: CP
Start: 2017-11-05 — End: ?

## 2017-11-05 MED ORDER — CEFDINIR 300 MG CAPSULE: 300 mg | capsule | 0 refills | 0 days

## 2017-11-05 MED FILL — BUPROPION HCL XL/300MG XL/TB24: BUPROPION HCL XL/300MG XL/TB24 | 31 days supply | Qty: 31 | Fill #0

## 2017-11-05 MED FILL — PREDNISONE/10MG/TAB: PREDNISONE/10MG/TAB | 31 days supply | Qty: 52 | Fill #0

## 2017-11-11 ENCOUNTER — Ambulatory Visit: Admit: 2017-11-11 | Discharge: 2017-11-12 | Payer: BLUE CROSS/BLUE SHIELD

## 2017-11-11 DIAGNOSIS — M25552 Pain in left hip: Secondary | ICD-10-CM

## 2017-11-11 DIAGNOSIS — M25551 Pain in right hip: Secondary | ICD-10-CM

## 2017-11-11 DIAGNOSIS — M0579 Rheumatoid arthritis with rheumatoid factor of multiple sites without organ or systems involvement: Principal | ICD-10-CM

## 2017-11-12 MED ORDER — MELOXICAM 7.5 MG TABLET
ORAL_TABLET | 0 refills | 0 days | Status: CP
Start: 2017-11-12 — End: 2017-12-11

## 2017-11-17 ENCOUNTER — Non-Acute Institutional Stay: Admit: 2017-11-17 | Discharge: 2017-11-17 | Payer: BLUE CROSS/BLUE SHIELD

## 2017-11-17 ENCOUNTER — Encounter: Admit: 2017-11-17 | Discharge: 2017-11-17 | Payer: BLUE CROSS/BLUE SHIELD

## 2017-11-17 DIAGNOSIS — K219 Gastro-esophageal reflux disease without esophagitis: Secondary | ICD-10-CM

## 2017-11-17 DIAGNOSIS — M0579 Rheumatoid arthritis with rheumatoid factor of multiple sites without organ or systems involvement: Principal | ICD-10-CM

## 2017-11-17 DIAGNOSIS — F17200 Nicotine dependence, unspecified, uncomplicated: Secondary | ICD-10-CM

## 2017-11-17 DIAGNOSIS — N179 Acute kidney failure, unspecified: Secondary | ICD-10-CM

## 2017-11-17 DIAGNOSIS — F17210 Nicotine dependence, cigarettes, uncomplicated: Secondary | ICD-10-CM

## 2017-11-17 DIAGNOSIS — J449 Chronic obstructive pulmonary disease, unspecified: Secondary | ICD-10-CM

## 2017-12-11 MED ORDER — MELOXICAM 7.5 MG TABLET
ORAL_TABLET | 0 refills | 0 days | Status: CP
Start: 2017-12-11 — End: 2018-01-10

## 2017-12-26 ENCOUNTER — Encounter: Admit: 2017-12-26 | Discharge: 2017-12-27 | Payer: BLUE CROSS/BLUE SHIELD

## 2017-12-26 DIAGNOSIS — M1990 Unspecified osteoarthritis, unspecified site: Principal | ICD-10-CM

## 2017-12-26 DIAGNOSIS — M0579 Rheumatoid arthritis with rheumatoid factor of multiple sites without organ or systems involvement: Secondary | ICD-10-CM

## 2017-12-26 DIAGNOSIS — Z13820 Encounter for screening for osteoporosis: Secondary | ICD-10-CM

## 2017-12-26 MED ORDER — PREDNISONE 5 MG TABLET
ORAL_TABLET | 1 refills | 0 days | Status: CP
Start: 2017-12-26 — End: 2018-06-24

## 2017-12-26 MED ORDER — DICLOFENAC 1 % TOPICAL GEL
Freq: Four times a day (QID) | TOPICAL | 2 refills | 0 days | Status: CP
Start: 2017-12-26 — End: 2018-07-22

## 2018-01-12 MED ORDER — MELOXICAM 7.5 MG TABLET
ORAL_TABLET | 0 refills | 0 days | Status: CP
Start: 2018-01-12 — End: 2018-02-12

## 2018-01-16 ENCOUNTER — Encounter: Admit: 2018-01-16 | Discharge: 2018-01-17 | Payer: BLUE CROSS/BLUE SHIELD

## 2018-01-16 DIAGNOSIS — Z13820 Encounter for screening for osteoporosis: Principal | ICD-10-CM

## 2018-01-20 MED ORDER — ASPIRIN 81 MG CHEWABLE TABLET
ORAL_TABLET | Freq: Every day | ORAL | 7 refills | 0.00000 days | Status: CP
Start: 2018-01-20 — End: 2019-01-20

## 2018-02-12 MED ORDER — MELOXICAM 7.5 MG TABLET
ORAL_TABLET | 0 refills | 0 days | Status: CP
Start: 2018-02-12 — End: 2018-02-18

## 2018-02-18 MED ORDER — NAPROXEN 500 MG TABLET
ORAL_TABLET | Freq: Two times a day (BID) | ORAL | 2 refills | 0 days | Status: CP
Start: 2018-02-18 — End: 2018-06-24

## 2018-03-11 ENCOUNTER — Encounter: Admit: 2018-03-11 | Discharge: 2018-03-12 | Payer: BLUE CROSS/BLUE SHIELD

## 2018-03-11 DIAGNOSIS — G5703 Lesion of sciatic nerve, bilateral lower limbs: Secondary | ICD-10-CM

## 2018-03-11 DIAGNOSIS — M069 Rheumatoid arthritis, unspecified: Principal | ICD-10-CM

## 2018-03-11 DIAGNOSIS — M7918 Myalgia, other site: Secondary | ICD-10-CM

## 2018-03-11 MED ORDER — DULOXETINE 20 MG CAPSULE,DELAYED RELEASE
ORAL_CAPSULE | Freq: Every day | ORAL | 11 refills | 0.00000 days | Status: CP
Start: 2018-03-11 — End: 2018-05-28

## 2018-03-11 MED ORDER — GABAPENTIN 300 MG CAPSULE
ORAL_CAPSULE | Freq: Three times a day (TID) | ORAL | 0 refills | 0.00000 days | Status: CP
Start: 2018-03-11 — End: 2018-04-05

## 2018-03-24 MED ORDER — PREDNISONE 5 MG TABLET
ORAL_TABLET | 11 refills | 0 days | Status: CP
Start: 2018-03-24 — End: 2019-01-11

## 2018-04-06 MED ORDER — GABAPENTIN 300 MG CAPSULE
ORAL_CAPSULE | Freq: Three times a day (TID) | ORAL | 5 refills | 0 days | Status: CP
Start: 2018-04-06 — End: 2018-05-28

## 2018-04-15 MED ORDER — CERTOLIZUMAB PEGOL 400 MG/2 ML (200 MG/ML X2) SUBCUTANEOUS SYRINGE KIT
SUBCUTANEOUS | 6 refills | 0.00000 days | Status: CP
Start: 2018-04-15 — End: 2018-11-27

## 2018-04-28 ENCOUNTER — Encounter: Admit: 2018-04-28 | Discharge: 2018-04-29 | Payer: BLUE CROSS/BLUE SHIELD

## 2018-04-28 DIAGNOSIS — M0579 Rheumatoid arthritis with rheumatoid factor of multiple sites without organ or systems involvement: Principal | ICD-10-CM

## 2018-05-23 ENCOUNTER — Emergency Department
Admission: EM | Admit: 2018-05-23 | Discharge: 2018-05-23 | Disposition: A | Discharge status: Home / Self Care | Payer: 59 | Attending: Emergency Medicine | Admitting: Emergency Medicine

## 2018-05-23 ENCOUNTER — Other Ambulatory Visit: Payer: Self-pay

## 2018-05-23 ENCOUNTER — Emergency Department: Payer: 59

## 2018-05-23 DIAGNOSIS — F17200 Nicotine dependence, unspecified, uncomplicated: Secondary | ICD-10-CM | POA: Insufficient documentation

## 2018-05-23 DIAGNOSIS — F329 Major depressive disorder, single episode, unspecified: Secondary | ICD-10-CM | POA: Diagnosis not present

## 2018-05-23 DIAGNOSIS — Z79899 Other long term (current) drug therapy: Secondary | ICD-10-CM | POA: Diagnosis not present

## 2018-05-23 DIAGNOSIS — R112 Nausea with vomiting, unspecified: Secondary | ICD-10-CM

## 2018-05-23 DIAGNOSIS — R197 Diarrhea, unspecified: Secondary | ICD-10-CM | POA: Insufficient documentation

## 2018-05-23 DIAGNOSIS — N179 Acute kidney failure, unspecified: Secondary | ICD-10-CM

## 2018-05-23 DIAGNOSIS — E860 Dehydration: Secondary | ICD-10-CM

## 2018-05-23 DIAGNOSIS — T730XXA Starvation, initial encounter: Secondary | ICD-10-CM

## 2018-05-23 LAB — CBC WITH DIFFERENTIAL/PLATELET
Abs Immature Granulocytes: 0.06 10*3/uL (ref 0.00–0.07)
BASOS PCT: 1 %
Basophils Absolute: 0.1 10*3/uL (ref 0.0–0.1)
EOS PCT: 0 %
Eosinophils Absolute: 0 10*3/uL (ref 0.0–0.5)
HEMATOCRIT: 39.5 % (ref 36.0–46.0)
Hemoglobin: 12.5 g/dL (ref 12.0–15.0)
Immature Granulocytes: 1 %
LYMPHS ABS: 1.5 10*3/uL (ref 0.7–4.0)
LYMPHS PCT: 12 %
MCH: 29.4 pg (ref 26.0–34.0)
MCHC: 31.6 g/dL (ref 30.0–36.0)
MCV: 92.9 fL (ref 80.0–100.0)
MONO ABS: 0.7 10*3/uL (ref 0.1–1.0)
MONOS PCT: 5 %
NEUTROS ABS: 9.9 10*3/uL — AB (ref 1.7–7.7)
Neutrophils Relative %: 81 %
Platelets: 322 10*3/uL (ref 150–400)
RBC: 4.25 MIL/uL (ref 3.87–5.11)
RDW: 14.5 % (ref 11.5–15.5)
WBC: 12.1 10*3/uL — ABNORMAL HIGH (ref 4.0–10.5)
nRBC: 0 % (ref 0.0–0.2)

## 2018-05-23 LAB — COMPREHENSIVE METABOLIC PANEL
ALBUMIN: 3.8 g/dL (ref 3.5–5.0)
ALK PHOS: 69 U/L (ref 38–126)
ALT: 21 U/L (ref 0–44)
ANION GAP: 12 (ref 5–15)
AST: 33 U/L (ref 15–41)
BUN: 21 mg/dL (ref 8–23)
CO2: 19 mmol/L — AB (ref 22–32)
Calcium: 9.3 mg/dL (ref 8.9–10.3)
Chloride: 109 mmol/L (ref 98–111)
Creatinine, Ser: 1.15 mg/dL — ABNORMAL HIGH (ref 0.44–1.00)
GFR, EST AFRICAN AMERICAN: 59 mL/min — AB (ref >60)
GFR, EST NON AFRICAN AMERICAN: 51 mL/min — AB (ref >60)
Glucose, Bld: 168 mg/dL — ABNORMAL HIGH (ref 70–99)
POTASSIUM: 3.8 mmol/L (ref 3.5–5.1)
Sodium: 140 mmol/L (ref 135–145)
TOTAL PROTEIN: 7.8 g/dL (ref 6.5–8.1)
Total Bilirubin: 0.9 mg/dL (ref 0.3–1.2)

## 2018-05-23 LAB — LIPASE, BLOOD: LIPASE: 32 U/L (ref 11–51)

## 2018-05-23 LAB — BETA-HYDROXYBUTYRIC ACID: BETA-HYDROXYBUTYRIC ACID: 1.51 mmol/L — AB (ref 0.05–0.27)

## 2018-05-23 MED ORDER — DEXTROSE 50 % IV SOLN: 2.0000 | Freq: Once | INTRAVENOUS | Status: DC

## 2018-05-23 MED ORDER — HYOSCYAMINE SULFATE 0.125 MG PO TBDP
0.2500 mg | ORAL_TABLET | Freq: Once | ORAL | Status: DC
  Filled 2018-05-23: qty 2

## 2018-05-23 MED ORDER — DEXTROSE 10 % IV SOLN: 500.0000 mL | Freq: Once | INTRAVENOUS | Status: DC

## 2018-05-23 MED ORDER — HALOPERIDOL LACTATE 5 MG/ML IJ SOLN
INTRAMUSCULAR | Status: AC
  Filled 2018-05-23: qty 1

## 2018-05-23 MED ORDER — SODIUM CHLORIDE 0.9 % IV BOLUS
1000.0000 mL | Freq: Once | INTRAVENOUS | Status: DC
  Administered 2018-05-23: 1000 mL via INTRAVENOUS

## 2018-05-23 MED ORDER — SODIUM CHLORIDE 0.9 % IV BOLUS
1000.0000 mL | Freq: Once | INTRAVENOUS | Status: AC
  Administered 2018-05-23: 1000 mL via INTRAVENOUS

## 2018-05-23 MED ORDER — LOPERAMIDE HCL 2 MG PO CAPS
4.0000 mg | ORAL_CAPSULE | Freq: Once | ORAL | Status: DC
  Filled 2018-05-23: qty 2

## 2018-05-23 MED ORDER — HYOSCYAMINE SULFATE SL 0.125 MG SL SUBL: 0.1250 ug | SUBLINGUAL_TABLET | Freq: Three times a day (TID) | SUBLINGUAL | 0 refills | Status: DC | PRN

## 2018-05-23 MED ORDER — HALOPERIDOL LACTATE 5 MG/ML IJ SOLN
2.5000 mg | Freq: Once | INTRAMUSCULAR | Status: AC
  Administered 2018-05-23: 2.5 mg via INTRAVENOUS

## 2018-05-23 MED ORDER — MORPHINE SULFATE (PF) 4 MG/ML IV SOLN
4.0000 mg | Freq: Once | INTRAVENOUS | Status: AC
  Administered 2018-05-23: 4 mg via INTRAVENOUS
  Filled 2018-05-23: qty 1

## 2018-05-23 MED ORDER — ONDANSETRON 4 MG PO TBDP: 4.0000 mg | ORAL_TABLET | Freq: Three times a day (TID) | ORAL | 0 refills | Status: DC | PRN

## 2018-05-23 NOTE — ED Notes (Signed)
Pt urinated on self. Pt was given a blue set of scrubs at this time.

## 2018-05-23 NOTE — ED Notes (Addendum)
Pt is still very sleepy. VSS RN will continue to monitor.

## 2018-05-23 NOTE — Discharge Instructions (Addendum)
Please make sure you remain well-hydrated with lots of small sips of fluids throughout the day.  The single best medication for diarrhea is over-the-counter Imodium (loperamide) and that is exactly what we gave you today in the emergency department.  The correct way to take Imodium is to take a dose after every episode of diarrhea up to a total of 8 pills a day.  Please follow-up with your primary care physician as needed and return to the emergency department for any concerns such as if you are unable to eat or drink, for worsening pain, or for any other issues whatsoever.  It was a pleasure to take care of you today, and thank you for coming to our emergency department.  If you have any questions or concerns before leaving please ask the nurse to grab me and I'm more than happy to go through your aftercare instructions again.  If you were prescribed any opioid pain medication today such as Norco, Vicodin, Percocet, morphine, hydrocodone, or oxycodone please make sure you do not drive when you are taking this medication as it can alter your ability to drive safely.  If you have any concerns once you are home that you are not improving or are in fact getting worse before you can make it to your follow-up appointment, please do not hesitate to call 911 and come back for further evaluation.  Merrily BrittleNeil Nicholai Willette, MD  Results for orders placed or performed during the hospital encounter of 05/23/18  Comprehensive metabolic panel  Result Value Ref Range   Sodium 140 135 - 145 mmol/L   Potassium 3.8 3.5 - 5.1 mmol/L   Chloride 109 98 - 111 mmol/L   CO2 19 (L) 22 - 32 mmol/L   Glucose, Bld 168 (H) 70 - 99 mg/dL   BUN 21 8 - 23 mg/dL   Creatinine, Ser 1.611.15 (H) 0.44 - 1.00 mg/dL   Calcium 9.3 8.9 - 09.610.3 mg/dL   Total Protein 7.8 6.5 - 8.1 g/dL   Albumin 3.8 3.5 - 5.0 g/dL   AST 33 15 - 41 U/L   ALT 21 0 - 44 U/L   Alkaline Phosphatase 69 38 - 126 U/L   Total Bilirubin 0.9 0.3 - 1.2 mg/dL   GFR calc non  Af Amer 51 (L) >60 mL/min   GFR calc Af Amer 59 (L) >60 mL/min   Anion gap 12 5 - 15  Lipase, blood  Result Value Ref Range   Lipase 32 11 - 51 U/L  CBC with Differential  Result Value Ref Range   WBC 12.1 (H) 4.0 - 10.5 K/uL   RBC 4.25 3.87 - 5.11 MIL/uL   Hemoglobin 12.5 12.0 - 15.0 g/dL   HCT 04.539.5 40.936.0 - 81.146.0 %   MCV 92.9 80.0 - 100.0 fL   MCH 29.4 26.0 - 34.0 pg   MCHC 31.6 30.0 - 36.0 g/dL   RDW 91.414.5 78.211.5 - 95.615.5 %   Platelets 322 150 - 400 K/uL   nRBC 0.0 0.0 - 0.2 %   Neutrophils Relative % 81 %   Neutro Abs 9.9 (H) 1.7 - 7.7 K/uL   Lymphocytes Relative 12 %   Lymphs Abs 1.5 0.7 - 4.0 K/uL   Monocytes Relative 5 %   Monocytes Absolute 0.7 0.1 - 1.0 K/uL   Eosinophils Relative 0 %   Eosinophils Absolute 0.0 0.0 - 0.5 K/uL   Basophils Relative 1 %   Basophils Absolute 0.1 0.0 - 0.1 K/uL   Immature Granulocytes  1 %   Abs Immature Granulocytes 0.06 0.00 - 0.07 K/uL  Beta-hydroxybutyric acid  Result Value Ref Range   Beta-Hydroxybutyric Acid 1.51 (H) 0.05 - 0.27 mmol/L   Dg Chest Port 1 View  Result Date: 05/23/2018 CLINICAL DATA:  Initial evaluation for possible aspiration. EXAM: PORTABLE CHEST 1 VIEW COMPARISON:  Prior radiograph from 09/23/2005. FINDINGS: Cardiac and mediastinal silhouettes are within normal limits. Lungs mildly hypoinflated. No focal infiltrates or findings to suggest aspiration pneumonitis. No pulmonary edema or pleural effusion. No pneumothorax. No acute osseous abnormality. IMPRESSION: No radiographic evidence for active cardiopulmonary disease. Electronically Signed   By: Rise Mu M.D.   On: 05/23/2018 06:33

## 2018-05-23 NOTE — ED Triage Notes (Signed)
Pt states generalized abd pain with nausea, vomiting, diarrhea that began yesterday. Pt arrives actively vomiting undigested food and bile. Pt has other sick contacts in the home.

## 2018-05-23 NOTE — ED Notes (Signed)
Pt advised she came in EMS and needs a phone to call. Pt was given phone to arrange transport home. Pt has not had another episode of vomiting at this time. RN will monitor for transport and discharge.

## 2018-05-23 NOTE — ED Notes (Addendum)
Pt is awake at this time requesting water. Rn gave pt water will monitor to see how pt tolerates PO.

## 2018-05-23 NOTE — ED Notes (Signed)
Pen pad not working in room. Pt was given paper signature for d/c

## 2018-05-23 NOTE — ED Notes (Signed)
Last diarrhea per pt at 2200 last pm. Pt arrived to ed vomiting copious amounts of undigested food and bile emesis. Pt with skin tenting noted. Pt states she does not believe she can tolerate pills this time. Will hold levsin and imodium.

## 2018-05-23 NOTE — ED Provider Notes (Addendum)
[facility] Emergency Department Provider Note  ____________________________________________   First MD Initiated Contact with Patient 05/23/18 [phone]     (approximate)  I have reviewed the triage vital signs and the nursing notes.   HISTORY  Chief Complaint Abdominal Pain; Emesis; and Diarrhea   HPI Jenna Davis is a 63 y.o. female comes to the emergency department with roughly 24 hours of nausea vomiting and diarrhea.  She says she has vomited everything she has tried to eat and continues to vomit even when she is not eating.  She has had 4 loose stools in the last 24 hours or so.  Her daughter has similar symptoms.  The patient is experiencing severe diffuse abdominal pain and bilateral leg pain.  The pain is worse when vomiting and only minimally improved thereafter.  She denies fevers or chills.  She is had no recent antibiotic use, no recent shellfish, and no international travel.  Symptoms came on suddenly were severe are now constant with bouts of intermittent worsening flares.  EMS did give her 4 mg of IV ondansetron with minimal relief.   Past Medical History:  Diagnosis Date  . A-fib (HCC)   . Depression   . GERD (gastroesophageal reflux disease)   . RA (rheumatoid arthritis) (HCC)   . Rheumatoid arthritis [facility](HCC)     Patient Active Problem List   Diagnosis Date Noted  . Osteomyelitis (HCC) 01/26/2016  . RA (rheumatoid arthritis) (HCC) 01/26/2016  . GERD (gastroesophageal reflux disease) 01/26/2016  . A-fib (HCC) 01/26/2016  . Depression 01/26/2016    Past Surgical History:  Procedure Laterality Date  . ATRIAL FIBRILLATION ABLATION    . OVARIAN CYST REMOVAL      Prior to Admission medications   Medication Sig Start Date End Date Taking? Authorizing Provider  acetaminophen (TYLENOL) 650 MG suppository Place 650 mg rectally every 4 (four) hours as needed. 11/05/17  Yes [provider]  aspirin 81 MG chewable tablet Chew 81 mg by  mouth daily. 01/20/18 01/20/19 Yes [provider]  buPROPion (WELLBUTRIN XL) 300 MG 24 hr tablet Take 300 mg by mouth daily. 11/05/17 11/05/18 Yes [provider]  diclofenac sodium (VOLTAREN) 1 % GEL Apply 2 g topically 4 (four) times daily. 12/26/17 12/26/18 Yes [provider]  DULoxetine (CYMBALTA) 20 MG capsule Take 20 mg by mouth daily. 03/11/18  Yes [provider]  folic acid (FOLVITE) 1 MG tablet Take 1 mg by mouth daily. 02/03/15  Yes [provider]  gabapentin (NEURONTIN) 300 MG capsule Take 900 mg by mouth 3 (three) times daily. 04/06/18  Yes [provider]  hydroxychloroquine (PLAQUENIL) 200 MG tablet Take 200 mg by mouth 2 (two) times daily. 07/31/17  Yes [provider]  naproxen (NAPROSYN) 500 MG tablet Take 500 mg by mouth 2 (two) times daily. 02/18/18 02/18/19 Yes [provider]  nicotine (NICODERM CQ - DOSED IN MG/24 HOURS) 14 mg/24hr patch Place 14 mg onto the skin daily. 11/03/17  Yes [provider]  nicotine polacrilex (NICORETTE) 4 MG gum Take 4 mg by mouth every 2 (two) hours. 07/12/17 07/12/18 Yes [provider]  omeprazole (PRILOSEC) 20 MG capsule Take 40 mg by mouth daily. 11/05/17  Yes [provider]  predniSONE (DELTASONE) 5 MG tablet Take 10 mg by mouth daily. 12/26/17  Yes [provider]  acetaminophen (TYLENOL) 325 MG tablet Take 2 tablets (650 mg total) by mouth every 6 (six) hours as needed for mild pain (or Fever >/=  101). 01/27/16   Enid Baas, MD  buPROPion (WELLBUTRIN SR) 150 MG 12 hr tablet Take 1 tablet by mouth 2 (two) times daily. 01/12/16   [provider]  ceFEPIme 2 g in dextrose 5 % 50 mL Inject 2 g into the vein every 8 (eight) hours. 01/27/16   Enid Baas, MD  HYDROcodone-acetaminophen (NORCO) 5-325 MG tablet Take 1 tablet by mouth every 6 (six) hours as needed for up to 7 doses for severe pain. 09/25/17   Merrily Brittle, MD  Hyoscyamine  Sulfate SL (LEVSIN/SL) 0.125 MG SUBL Place 0.125 mcg under the tongue 3 (three) times daily as needed (stomach pain). 05/23/18   Merrily Brittle, MD  ibuprofen (ADVIL,MOTRIN) 600 MG tablet Take 1 tablet (600 mg total) by mouth every 8 (eight) hours as needed. 09/25/17   Merrily Brittle, MD  methotrexate (RHEUMATREX) 2.5 MG tablet Take 8 tablets by mouth once a week. On wednesdays 12/26/15   [provider]  ondansetron (ZOFRAN ODT) 4 MG disintegrating tablet Take 1 tablet (4 mg total) by mouth every 8 (eight) hours as needed for nausea or vomiting. 05/23/18   Merrily Brittle, MD  oxyCODONE (OXY IR/ROXICODONE) 5 MG immediate release tablet Take 1 tablet (5 mg total) by mouth every 4 (four) hours as needed for moderate pain or severe pain. 01/27/16   Enid Baas, MD  polyethylene glycol (MIRALAX / GLYCOLAX) packet Take 17 g by mouth daily.    [provider]  promethazine (PHENERGAN) 12.5 MG tablet Take 12.5 mg by mouth every 6 (six) hours as needed.    [provider]  ranitidine (ZANTAC) 150 MG tablet Take 150 mg by mouth at bedtime. As needed 03/13/15   [provider]  Tofacitinib Citrate 5 MG TABS Take 5 mg by mouth 2 (two) times daily.    [provider]  triamcinolone cream (KENALOG) 0.1 % Apply 1 application topically daily.    [provider]  vancomycin (VANCOCIN) 1-5 GM/200ML-% SOLN Inject 200 mLs (1,000 mg total) into the vein every 12 (twelve) hours. 01/27/16   Enid Baas, MD    Allergies Patient has no known allergies.  Family History  Problem Relation Age of Onset  . Diabetes Mother   . Breast cancer Mother   . Diabetes Father     Social History Social History   Tobacco Use  . Smoking status: Current Every Day Smoker  . Smokeless tobacco: Never Used  Substance Use Topics  . Alcohol use: No  . Drug use: No    Review of Systems Constitutional: No fever/chills Eyes: No visual changes. ENT: No sore  throat. Cardiovascular: Denies chest pain. Respiratory: Denies shortness of breath. Gastrointestinal: Positive for abdominal pain.  Positive for nausea, positive for vomiting.  Positive for diarrhea.  No constipation. Genitourinary: Negative for dysuria. Musculoskeletal: Negative for back pain. Skin: Negative for rash. Neurological: Negative for headaches, focal weakness or numbness.   ____________________________________________   PHYSICAL EXAM:  VITAL SIGNS: ED Triage Vitals  Enc Vitals Group     BP      Pulse      Resp      Temp      Temp src      SpO2      Weight      Height      Head Circumference      Peak Flow      Pain Score      Pain Loc      Pain Edu?  Excl. in GC?     Constitutional: Alert and oriented x4 appears miserable curled onto her right side crying and vomiting all over the room Eyes: PERRL EOMI. Head: Atraumatic. Nose: No congestion/rhinnorhea. Mouth/Throat: No trismus Neck: No stridor.   Cardiovascular: Tachycardic rate, regular rhythm. Grossly normal heart sounds.  Good peripheral circulation. Respiratory: Increased respiratory effort.  No retractions. Lungs CTAB and moving good air Gastrointestinal: Soft no peritonitis mild diffuse tenderness with hyperactive bowel sounds Musculoskeletal: No lower extremity edema   Neurologic:  Normal speech and language. No gross focal neurologic deficits are appreciated. Skin:  Skin is warm, dry and intact. No rash noted. Psychiatric: Mood and affect are normal. Speech and behavior are normal.    ____________________________________________   DIFFERENTIAL includes but not limited to  Viral diarrhea, C. difficile, bacterial gastroenteritis, volvulus, aspiration ____________________________________________   LABS (all labs ordered are listed, but only abnormal results are displayed)  Labs Reviewed  COMPREHENSIVE METABOLIC PANEL - Abnormal; Notable for the following components:      Result Value    CO2 19 (*)    Glucose, Bld 168 (*)    Creatinine, Ser 1.15 (*)    GFR calc non Af Amer 51 (*)    GFR calc Af Amer 59 (*)    All other components within normal limits  CBC WITH DIFFERENTIAL/PLATELET - Abnormal; Notable for the following components:   WBC 12.1 (*)    Neutro Abs 9.9 (*)    All other components within normal limits  BETA-HYDROXYBUTYRIC ACID - Abnormal; Notable for the following components:   Beta-Hydroxybutyric Acid 1.51 (*)    All other components within normal limits  LIPASE, BLOOD    Lab work reviewed by me with slight increase in the patient's creatinine otherwise unremarkable __________________________________________  EKG   ____________________________________________  RADIOLOGY  Chest x-ray reviewed by me with no acute disease noted ____________________________________________   PROCEDURES  Procedure(s) performed: no  .Critical Care Performed by: Merrily Brittle, MD Authorized by: Merrily Brittle, MD   Critical care provider statement:    Critical care time (minutes):  30   Critical care time was exclusive of:  Separately billable procedures and treating other patients   Critical care was necessary to treat or prevent imminent or life-threatening deterioration of the following conditions:  Renal failure and dehydration   Critical care was time spent personally by me on the following activities:  Development of treatment plan with patient or surrogate, discussions with consultants, evaluation of patient's response to treatment, examination of patient, obtaining history from patient or surrogate, ordering and performing treatments and interventions, ordering and review of laboratory studies, ordering and review of radiographic studies, pulse oximetry, re-evaluation of patient's condition and review of old charts    Critical Care performed: yes  ____________________________________________   INITIAL IMPRESSION / ASSESSMENT AND PLAN / ED  COURSE  Pertinent labs & imaging results that were available during my care of the patient were reviewed by me and considered in my medical decision making (see chart for details).   As part of my medical decision making, I reviewed the following data within the electronic MEDICAL RECORD NUMBER History obtained from family if available, nursing notes, old chart and ekg, as well as notes from prior ED visits.  The patient comes to the emergency department with profound nausea vomiting and diarrhea over the past 24 hours.  Her daughter has similar symptoms raising concern for an infectious etiology.  She has no risk factors for C. difficile.  We  will begin with IV fluids along with IV morphine for pain and IV haloperidol for pain and nausea.  Loperamide orally 4 mg and Levsin for diarrhea and cramping.  I will be checking labs most notably looking for renal function as well as stool study if she is able to provide it.  We have her on enteric precautions now.  Clinical Course as of Jun 04 314  Sat May 23, 2018  0703 Pending 2nd L of fluid, then d/c home   [SS]    Clinical Course User Index [SS] Dionne Bucy, MD    ----------------------------------------- 6:53 AM on 05/23/2018 -----------------------------------------  The patient is no longer vomiting and is now resting comfortably.  She is had no further episodes of diarrhea.  Her symptoms are likely related to viral gastroenteritis.  We discussed handwashing.  And the importance of remaining well-hydrated.  I will allow her second liter to go in and make a D10 this time to help with hydration and starvation and I anticipate discharge with loperamide, ondansetron, and Levsin.  The patient verbalizes understanding and agreement with the plan. ____________________________________________   FINAL CLINICAL IMPRESSION(S) / ED DIAGNOSES  Final diagnoses:  Nausea vomiting and diarrhea  Dehydration  Starvation, initial encounter  Acute  kidney injury (HCC)      NEW MEDICATIONS STARTED DURING THIS VISIT:  Discharge Medication List as of 05/23/2018  6:50 AM    START taking these medications   Details  Hyoscyamine Sulfate SL (LEVSIN/SL) 0.125 MG SUBL Place 0.125 mcg under the tongue 3 (three) times daily as needed (stomach pain)., Starting Sat 05/23/2018, Print    ondansetron (ZOFRAN ODT) 4 MG disintegrating tablet Take 1 tablet (4 mg total) by mouth every 8 (eight) hours as needed for nausea or vomiting., Starting Sat 05/23/2018, Print         Note:  This document was prepared using Dragon voice recognition software and may include unintentional dictation errors.    Merrily Brittle, MD 05/23/18 [id number]    Merrily Brittle, MD 06/04/18 [phone]

## 2018-05-23 NOTE — ED Notes (Signed)

## 2018-05-23 NOTE — ED Notes (Addendum)
Pt is sleeping. Pt is arousalable, but very lethargic at this time. RN will monitor.

## 2018-05-23 NOTE — ED Notes (Signed)
Per handoff report from April RN. Pt refused ANA SPAZ. This RN will mark on [facility]

## 2018-05-23 NOTE — ED Notes (Signed)
Report to [place], rn,

## 2018-05-23 NOTE — ED Notes (Signed)
Pt has arranged for transport. Pt is getting dressed for d/c at this time. RN will provide d/c instructions and pt will wait in room as pt will vomit on floor, pt is suitable for lobby.

## 2018-05-25 ENCOUNTER — Encounter: Admit: 2018-05-25 | Discharge: 2018-05-26 | Payer: BLUE CROSS/BLUE SHIELD

## 2018-05-25 DIAGNOSIS — M5431 Sciatica, right side: Principal | ICD-10-CM

## 2018-05-25 DIAGNOSIS — R112 Nausea with vomiting, unspecified: Secondary | ICD-10-CM

## 2018-05-25 MED ORDER — OXYCODONE 5 MG TABLET
ORAL_TABLET | Freq: Three times a day (TID) | ORAL | 0 refills | 0.00000 days | Status: CP | PRN
Start: 2018-05-25 — End: 2018-06-24
  Filled 2018-05-25: qty 15, 5d supply, fill #0

## 2018-05-25 MED ORDER — ONDANSETRON 4 MG DISINTEGRATING TABLET
ORAL_TABLET | Freq: Three times a day (TID) | ORAL | 0 refills | 0.00000 days | Status: CP | PRN
Start: 2018-05-25 — End: 2018-06-24

## 2018-05-25 MED FILL — OXYCODONE 5 MG TABLET: 5 days supply | Qty: 15 | Fill #0 | Status: AC

## 2018-05-28 ENCOUNTER — Encounter: Admit: 2018-05-28 | Discharge: 2018-05-29 | Payer: BLUE CROSS/BLUE SHIELD

## 2018-05-28 DIAGNOSIS — R112 Nausea with vomiting, unspecified: Secondary | ICD-10-CM

## 2018-05-28 DIAGNOSIS — G5703 Lesion of sciatic nerve, bilateral lower limbs: Secondary | ICD-10-CM

## 2018-05-28 DIAGNOSIS — D899 Disorder involving the immune mechanism, unspecified: Secondary | ICD-10-CM

## 2018-05-28 DIAGNOSIS — M069 Rheumatoid arthritis, unspecified: Principal | ICD-10-CM

## 2018-05-28 MED ORDER — HYDROXYCHLOROQUINE 200 MG TABLET
ORAL_TABLET | Freq: Two times a day (BID) | ORAL | 1 refills | 0.00000 days | Status: CP
Start: 2018-05-28 — End: 2019-01-11

## 2018-05-28 MED ORDER — GABAPENTIN 300 MG CAPSULE
ORAL_CAPSULE | 5 refills | 0 days | Status: CP
Start: 2018-05-28 — End: 2018-09-02

## 2018-05-28 MED ORDER — DULOXETINE 40 MG CAPSULE,DELAYED RELEASE
ORAL_CAPSULE | Freq: Every day | ORAL | 3 refills | 0 days | Status: CP
Start: 2018-05-28 — End: 2018-06-24

## 2018-05-28 MED ORDER — METHOTREXATE SODIUM 2.5 MG TABLET
ORAL_TABLET | ORAL | 3 refills | 0 days | Status: CP
Start: 2018-05-28 — End: 2018-10-05

## 2018-05-28 MED ORDER — BUPROPION HCL XL 150 MG 24 HR TABLET, EXTENDED RELEASE
Freq: Every morning | ORAL | 3 refills | 0.00000 days | Status: CP
Start: 2018-05-28 — End: 2018-11-18

## 2018-06-24 ENCOUNTER — Encounter: Admit: 2018-06-24 | Discharge: 2018-06-24 | Payer: BLUE CROSS/BLUE SHIELD

## 2018-06-24 ENCOUNTER — Ambulatory Visit: Admit: 2018-06-24 | Discharge: 2018-06-24 | Payer: BLUE CROSS/BLUE SHIELD

## 2018-06-24 DIAGNOSIS — R7303 Prediabetes: Secondary | ICD-10-CM

## 2018-06-24 DIAGNOSIS — M5431 Sciatica, right side: Principal | ICD-10-CM

## 2018-06-24 DIAGNOSIS — R200 Anesthesia of skin: Secondary | ICD-10-CM

## 2018-06-24 MED ORDER — DULOXETINE 60 MG CAPSULE,DELAYED RELEASE
ORAL_CAPSULE | Freq: Every day | ORAL | 11 refills | 0.00000 days | Status: CP
Start: 2018-06-24 — End: 2019-06-24
  Filled 2018-06-24: qty 30, 30d supply, fill #0

## 2018-06-24 MED ORDER — NAPROXEN 500 MG TABLET
ORAL_TABLET | Freq: Two times a day (BID) | ORAL | 2 refills | 0 days | Status: CP
Start: 2018-06-24 — End: 2018-07-22
  Filled 2018-06-24: qty 60, 30d supply, fill #0

## 2018-06-24 MED FILL — DULOXETINE 60 MG CAPSULE,DELAYED RELEASE: 30 days supply | Qty: 30 | Fill #0 | Status: AC

## 2018-06-24 MED FILL — NAPROXEN 500 MG TABLET: 30 days supply | Qty: 60 | Fill #0 | Status: AC

## 2018-07-22 ENCOUNTER — Encounter: Admit: 2018-07-22 | Discharge: 2018-07-23 | Payer: BLUE CROSS/BLUE SHIELD

## 2018-07-22 DIAGNOSIS — R200 Anesthesia of skin: Principal | ICD-10-CM

## 2018-07-22 DIAGNOSIS — K219 Gastro-esophageal reflux disease without esophagitis: Principal | ICD-10-CM

## 2018-07-22 DIAGNOSIS — M1990 Unspecified osteoarthritis, unspecified site: Principal | ICD-10-CM

## 2018-07-22 DIAGNOSIS — M5431 Sciatica, right side: Secondary | ICD-10-CM

## 2018-07-22 DIAGNOSIS — H269 Unspecified cataract: Principal | ICD-10-CM

## 2018-07-22 DIAGNOSIS — M5432 Sciatica, left side: Secondary | ICD-10-CM

## 2018-07-22 DIAGNOSIS — J449 Chronic obstructive pulmonary disease, unspecified: Principal | ICD-10-CM

## 2018-07-22 DIAGNOSIS — F17200 Nicotine dependence, unspecified, uncomplicated: Principal | ICD-10-CM

## 2018-07-22 DIAGNOSIS — K30 Functional dyspepsia: Principal | ICD-10-CM

## 2018-07-22 DIAGNOSIS — F329 Major depressive disorder, single episode, unspecified: Principal | ICD-10-CM

## 2018-07-22 DIAGNOSIS — M109 Gout, unspecified: Principal | ICD-10-CM

## 2018-07-22 MED ORDER — TRAMADOL 50 MG TABLET
ORAL_TABLET | Freq: Three times a day (TID) | ORAL | 0 refills | 0.00000 days | Status: SS | PRN
Start: 2018-07-22 — End: 2018-08-24

## 2018-07-22 MED ORDER — NAPROXEN 500 MG TABLET
ORAL_TABLET | Freq: Three times a day (TID) | ORAL | 2 refills | 0 days | Status: CP
Start: 2018-07-22 — End: 2018-08-27

## 2018-07-22 MED FILL — DULOXETINE 60 MG CAPSULE,DELAYED RELEASE: 30 days supply | Qty: 30 | Fill #1 | Status: AC

## 2018-07-22 MED FILL — DULOXETINE 60 MG CAPSULE,DELAYED RELEASE: ORAL | 30 days supply | Qty: 30 | Fill #1

## 2018-07-24 DIAGNOSIS — M5432 Sciatica, left side: Principal | ICD-10-CM

## 2018-07-24 DIAGNOSIS — M5431 Sciatica, right side: Principal | ICD-10-CM

## 2018-08-14 ENCOUNTER — Encounter: Admit: 2018-08-14 | Discharge: 2018-08-14 | Payer: BLUE CROSS/BLUE SHIELD

## 2018-08-14 DIAGNOSIS — M5431 Sciatica, right side: Principal | ICD-10-CM

## 2018-08-14 DIAGNOSIS — M48061 Spinal stenosis, lumbar region without neurogenic claudication: Principal | ICD-10-CM

## 2018-08-14 DIAGNOSIS — M47816 Spondylosis without myelopathy or radiculopathy, lumbar region: Principal | ICD-10-CM

## 2018-08-14 DIAGNOSIS — M4316 Spondylolisthesis, lumbar region: Principal | ICD-10-CM

## 2018-08-14 DIAGNOSIS — M5126 Other intervertebral disc displacement, lumbar region: Principal | ICD-10-CM

## 2018-08-14 DIAGNOSIS — M5432 Sciatica, left side: Principal | ICD-10-CM

## 2018-08-22 ENCOUNTER — Ambulatory Visit
Admit: 2018-08-22 | Discharge: 2018-08-24 | Disposition: A | Discharge status: Home / Self Care | Payer: BLUE CROSS/BLUE SHIELD | Admitting: Internal Medicine

## 2018-08-22 DIAGNOSIS — R112 Nausea with vomiting, unspecified: Principal | ICD-10-CM

## 2018-08-24 MED ORDER — TRAMADOL 50 MG TABLET
ORAL_TABLET | Freq: Four times a day (QID) | ORAL | 0 refills | 0 days | Status: CP | PRN
Start: 2018-08-24 — End: 2018-09-04
  Filled 2018-08-24: qty 20, 5d supply, fill #0

## 2018-08-24 MED ORDER — CEPHALEXIN 500 MG CAPSULE
ORAL_CAPSULE | Freq: Three times a day (TID) | ORAL | 0 refills | 0.00000 days | Status: CP
Start: 2018-08-24 — End: 2018-09-18

## 2018-08-24 MED FILL — CEPHALEXIN 500 MG CAPSULE: 4 days supply | Qty: 12 | Fill #0 | Status: AC

## 2018-08-24 MED FILL — TRAMADOL 50 MG TABLET: 5 days supply | Qty: 20 | Fill #0 | Status: AC

## 2018-08-25 MED ORDER — CEPHALEXIN 500 MG CAPSULE
ORAL_CAPSULE | Freq: Three times a day (TID) | ORAL | 0 refills | 0.00000 days | Status: CP
Start: 2018-08-25 — End: 2018-08-24
  Filled 2018-08-24: qty 12, 4d supply, fill #0

## 2018-08-27 ENCOUNTER — Encounter: Admit: 2018-08-27 | Discharge: 2018-08-28 | Payer: BLUE CROSS/BLUE SHIELD

## 2018-08-27 DIAGNOSIS — F17200 Nicotine dependence, unspecified, uncomplicated: Secondary | ICD-10-CM

## 2018-08-27 DIAGNOSIS — J449 Chronic obstructive pulmonary disease, unspecified: Secondary | ICD-10-CM

## 2018-08-27 DIAGNOSIS — M069 Rheumatoid arthritis, unspecified: Principal | ICD-10-CM

## 2018-08-27 DIAGNOSIS — Z09 Encounter for follow-up examination after completed treatment for conditions other than malignant neoplasm: Secondary | ICD-10-CM

## 2018-08-27 DIAGNOSIS — N12 Tubulo-interstitial nephritis, not specified as acute or chronic: Secondary | ICD-10-CM

## 2018-08-27 MED ORDER — MELOXICAM 7.5 MG TABLET
ORAL_TABLET | Freq: Every day | ORAL | 2 refills | 0 days | Status: CP
Start: 2018-08-27 — End: 2018-11-18

## 2018-09-02 ENCOUNTER — Encounter: Admit: 2018-09-02 | Discharge: 2018-09-03 | Payer: BLUE CROSS/BLUE SHIELD

## 2018-09-02 ENCOUNTER — Institutional Professional Consult (permissible substitution): Admit: 2018-09-02 | Discharge: 2018-09-03 | Payer: BLUE CROSS/BLUE SHIELD

## 2018-09-02 DIAGNOSIS — M0579 Rheumatoid arthritis with rheumatoid factor of multiple sites without organ or systems involvement: Secondary | ICD-10-CM

## 2018-09-02 DIAGNOSIS — M5431 Sciatica, right side: Principal | ICD-10-CM

## 2018-09-02 MED ORDER — GABAPENTIN 300 MG CAPSULE
ORAL_CAPSULE | Freq: Three times a day (TID) | ORAL | 5 refills | 0.00000 days | Status: CP
Start: 2018-09-02 — End: 2018-09-25

## 2018-09-02 MED FILL — MELOXICAM 7.5 MG TABLET: ORAL | 30 days supply | Qty: 30 | Fill #0

## 2018-09-02 MED FILL — MELOXICAM 7.5 MG TABLET: 30 days supply | Qty: 30 | Fill #0 | Status: AC

## 2018-09-04 ENCOUNTER — Encounter: Admit: 2018-09-04 | Discharge: 2018-09-05 | Payer: BLUE CROSS/BLUE SHIELD

## 2018-09-04 DIAGNOSIS — M5116 Intervertebral disc disorders with radiculopathy, lumbar region: Secondary | ICD-10-CM

## 2018-09-04 DIAGNOSIS — M4800 Spinal stenosis, site unspecified: Secondary | ICD-10-CM

## 2018-09-04 DIAGNOSIS — M5440 Lumbago with sciatica, unspecified side: Principal | ICD-10-CM

## 2018-09-18 ENCOUNTER — Ambulatory Visit
Admit: 2018-09-18 | Discharge: 2018-09-19 | Payer: BLUE CROSS/BLUE SHIELD | Attending: Neurological Surgery | Primary: Neurological Surgery

## 2018-09-18 ENCOUNTER — Encounter: Admit: 2018-09-18 | Discharge: 2018-09-19 | Payer: BLUE CROSS/BLUE SHIELD

## 2018-09-18 DIAGNOSIS — M5431 Sciatica, right side: Principal | ICD-10-CM

## 2018-09-18 DIAGNOSIS — M4316 Spondylolisthesis, lumbar region: Secondary | ICD-10-CM

## 2018-09-18 DIAGNOSIS — M48062 Spinal stenosis, lumbar region with neurogenic claudication: Secondary | ICD-10-CM

## 2018-09-25 MED ORDER — GABAPENTIN 300 MG CAPSULE
ORAL_CAPSULE | Freq: Three times a day (TID) | ORAL | 2 refills | 0.00000 days | Status: CP
Start: 2018-09-25 — End: 2018-11-18

## 2018-10-05 MED ORDER — METHOTREXATE SODIUM 2.5 MG TABLET
ORAL_TABLET | 3 refills | 0 days | Status: CP
Start: 2018-10-05 — End: 2019-01-11

## 2018-10-08 ENCOUNTER — Encounter
Admit: 2018-10-08 | Discharge: 2018-10-09 | Payer: BLUE CROSS/BLUE SHIELD | Attending: Physical Medicine & Rehabilitation | Primary: Physical Medicine & Rehabilitation

## 2018-10-08 DIAGNOSIS — M48062 Spinal stenosis, lumbar region with neurogenic claudication: Secondary | ICD-10-CM

## 2018-10-08 DIAGNOSIS — M5417 Radiculopathy, lumbosacral region: Principal | ICD-10-CM

## 2018-10-08 DIAGNOSIS — M5431 Sciatica, right side: Secondary | ICD-10-CM

## 2018-10-08 DIAGNOSIS — M4316 Spondylolisthesis, lumbar region: Secondary | ICD-10-CM

## 2018-10-20 MED FILL — MELOXICAM 7.5 MG TABLET: 30 days supply | Qty: 30 | Fill #1 | Status: AC

## 2018-10-20 MED FILL — MELOXICAM 7.5 MG TABLET: ORAL | 30 days supply | Qty: 30 | Fill #1

## 2018-10-20 MED FILL — DULOXETINE 60 MG CAPSULE,DELAYED RELEASE: 30 days supply | Qty: 30 | Fill #2 | Status: AC

## 2018-10-20 MED FILL — DULOXETINE 60 MG CAPSULE,DELAYED RELEASE: ORAL | 30 days supply | Qty: 30 | Fill #2

## 2018-11-11 ENCOUNTER — Encounter: Admit: 2018-11-11 | Discharge: 2018-11-11 | Payer: BLUE CROSS/BLUE SHIELD

## 2018-11-11 DIAGNOSIS — M5431 Sciatica, right side: Secondary | ICD-10-CM

## 2018-11-11 DIAGNOSIS — M5432 Sciatica, left side: Secondary | ICD-10-CM

## 2018-11-11 DIAGNOSIS — M48062 Spinal stenosis, lumbar region with neurogenic claudication: Secondary | ICD-10-CM

## 2018-11-11 DIAGNOSIS — R112 Nausea with vomiting, unspecified: Principal | ICD-10-CM

## 2018-11-11 MED ORDER — ACETAMINOPHEN 500 MG TABLET
ORAL_TABLET | Freq: Four times a day (QID) | ORAL | 0 refills | 0.00000 days | Status: CP | PRN
Start: 2018-11-11 — End: ?

## 2018-11-11 MED ORDER — TRAMADOL 50 MG TABLET
ORAL_TABLET | Freq: Three times a day (TID) | ORAL | 0 refills | 0.00000 days | Status: CP | PRN
Start: 2018-11-11 — End: 2018-11-18

## 2018-11-12 ENCOUNTER — Encounter: Admit: 2018-11-12 | Discharge: 2018-11-15 | Payer: BLUE CROSS/BLUE SHIELD

## 2018-11-12 ENCOUNTER — Ambulatory Visit: Admit: 2018-11-12 | Discharge: 2018-11-15 | Payer: BLUE CROSS/BLUE SHIELD

## 2018-11-12 DIAGNOSIS — R112 Nausea with vomiting, unspecified: Principal | ICD-10-CM

## 2018-11-13 DIAGNOSIS — R112 Nausea with vomiting, unspecified: Principal | ICD-10-CM

## 2018-11-14 DIAGNOSIS — R112 Nausea with vomiting, unspecified: Principal | ICD-10-CM

## 2018-11-15 MED ORDER — CEFPODOXIME 100 MG TABLET
ORAL_TABLET | Freq: Two times a day (BID) | ORAL | 0 refills | 0 days | Status: CP
Start: 2018-11-15 — End: 2018-11-17

## 2018-11-16 MED ORDER — NICOTINE 21 MG/24 HR DAILY TRANSDERMAL PATCH
MEDICATED_PATCH | Freq: Every day | TRANSDERMAL | 0 refills | 0 days | Status: CP
Start: 2018-11-16 — End: ?

## 2018-11-18 ENCOUNTER — Encounter: Admit: 2018-11-18 | Discharge: 2018-11-19 | Payer: BLUE CROSS/BLUE SHIELD

## 2018-11-18 DIAGNOSIS — N390 Urinary tract infection, site not specified: Secondary | ICD-10-CM

## 2018-11-18 DIAGNOSIS — M5116 Intervertebral disc disorders with radiculopathy, lumbar region: Secondary | ICD-10-CM

## 2018-11-18 DIAGNOSIS — M5431 Sciatica, right side: Principal | ICD-10-CM

## 2018-11-18 MED ORDER — GABAPENTIN 300 MG CAPSULE
ORAL_CAPSULE | Freq: Four times a day (QID) | ORAL | 2 refills | 0.00000 days | Status: CP
Start: 2018-11-18 — End: ?

## 2018-11-18 MED ORDER — MELOXICAM 7.5 MG TABLET
ORAL_TABLET | Freq: Two times a day (BID) | ORAL | 2 refills | 0.00000 days | Status: CP
Start: 2018-11-18 — End: ?

## 2018-11-18 MED ORDER — BUPROPION HCL XL 150 MG 24 HR TABLET, EXTENDED RELEASE
Freq: Every morning | ORAL | 3 refills | 0 days | Status: CP
Start: 2018-11-18 — End: 2019-11-18

## 2018-11-18 MED ORDER — TRAMADOL 50 MG TABLET
ORAL_TABLET | Freq: Three times a day (TID) | ORAL | 0 refills | 0 days | Status: CP | PRN
Start: 2018-11-18 — End: 2018-12-29

## 2018-11-18 MED ORDER — CEPHALEXIN 250 MG CAPSULE
ORAL_CAPSULE | Freq: Every day | ORAL | 0 refills | 0.00000 days | Status: CP
Start: 2018-11-18 — End: 2018-12-31

## 2018-11-27 ENCOUNTER — Institutional Professional Consult (permissible substitution): Admit: 2018-11-27 | Discharge: 2018-11-28 | Payer: BLUE CROSS/BLUE SHIELD

## 2018-11-27 DIAGNOSIS — G894 Chronic pain syndrome: Secondary | ICD-10-CM

## 2018-11-27 DIAGNOSIS — Z09 Encounter for follow-up examination after completed treatment for conditions other than malignant neoplasm: Secondary | ICD-10-CM

## 2018-11-27 DIAGNOSIS — N390 Urinary tract infection, site not specified: Principal | ICD-10-CM

## 2018-11-27 DIAGNOSIS — S42401S Unspecified fracture of lower end of right humerus, sequela: Secondary | ICD-10-CM

## 2018-11-27 DIAGNOSIS — M069 Rheumatoid arthritis, unspecified: Secondary | ICD-10-CM

## 2018-11-27 MED ORDER — MIRTAZAPINE 15 MG TABLET
ORAL_TABLET | Freq: Every evening | ORAL | 0 refills | 0.00000 days | Status: CP | PRN
Start: 2018-11-27 — End: 2018-12-11

## 2018-11-27 MED ORDER — HYDROCODONE 2.5 MG-ACETAMINOPHEN 325 MG TABLET
ORAL_TABLET | Freq: Every evening | ORAL | 0 refills | 0 days | Status: CP | PRN
Start: 2018-11-27 — End: 2018-12-29

## 2018-12-03 ENCOUNTER — Encounter: Admit: 2018-12-03 | Discharge: 2018-12-04 | Payer: BLUE CROSS/BLUE SHIELD

## 2018-12-03 DIAGNOSIS — G894 Chronic pain syndrome: Principal | ICD-10-CM

## 2018-12-08 ENCOUNTER — Encounter: Admit: 2018-12-08 | Discharge: 2018-12-09 | Payer: BLUE CROSS/BLUE SHIELD

## 2018-12-08 DIAGNOSIS — M5417 Radiculopathy, lumbosacral region: Principal | ICD-10-CM

## 2018-12-11 ENCOUNTER — Encounter
Admit: 2018-12-11 | Discharge: 2018-12-12 | Payer: BLUE CROSS/BLUE SHIELD | Attending: Physical Medicine & Rehabilitation | Primary: Physical Medicine & Rehabilitation

## 2018-12-11 ENCOUNTER — Encounter: Admit: 2018-12-11 | Discharge: 2018-12-12 | Payer: BLUE CROSS/BLUE SHIELD

## 2018-12-11 DIAGNOSIS — M47817 Spondylosis without myelopathy or radiculopathy, lumbosacral region: Principal | ICD-10-CM

## 2018-12-11 MED ORDER — MIRTAZAPINE 15 MG TABLET
ORAL_TABLET | Freq: Every evening | ORAL | 1 refills | 90 days | Status: CP | PRN
Start: 2018-12-11 — End: 2019-12-11

## 2018-12-15 ENCOUNTER — Encounter: Admit: 2018-12-15 | Discharge: 2018-12-16 | Payer: BLUE CROSS/BLUE SHIELD

## 2018-12-15 DIAGNOSIS — M5431 Sciatica, right side: Secondary | ICD-10-CM

## 2018-12-15 DIAGNOSIS — M4316 Spondylolisthesis, lumbar region: Secondary | ICD-10-CM

## 2018-12-15 DIAGNOSIS — M069 Rheumatoid arthritis, unspecified: Principal | ICD-10-CM

## 2018-12-29 ENCOUNTER — Institutional Professional Consult (permissible substitution): Admit: 2018-12-29 | Discharge: 2018-12-29 | Payer: BLUE CROSS/BLUE SHIELD

## 2018-12-29 ENCOUNTER — Encounter: Admit: 2018-12-29 | Discharge: 2018-12-29 | Payer: BLUE CROSS/BLUE SHIELD | Attending: Family | Primary: Family

## 2018-12-29 DIAGNOSIS — R1110 Vomiting, unspecified: Secondary | ICD-10-CM

## 2018-12-29 DIAGNOSIS — M069 Rheumatoid arthritis, unspecified: Principal | ICD-10-CM

## 2018-12-29 DIAGNOSIS — G894 Chronic pain syndrome: Secondary | ICD-10-CM

## 2018-12-29 DIAGNOSIS — S42401S Unspecified fracture of lower end of right humerus, sequela: Secondary | ICD-10-CM

## 2018-12-29 DIAGNOSIS — Z20828 Contact with and (suspected) exposure to other viral communicable diseases: Principal | ICD-10-CM

## 2018-12-29 DIAGNOSIS — M48062 Spinal stenosis, lumbar region with neurogenic claudication: Secondary | ICD-10-CM

## 2018-12-29 DIAGNOSIS — M5431 Sciatica, right side: Secondary | ICD-10-CM

## 2018-12-29 MED ORDER — TRAMADOL 50 MG TABLET
ORAL_TABLET | Freq: Three times a day (TID) | ORAL | 0 refills | 30.00000 days | Status: CP | PRN
Start: 2018-12-29 — End: ?

## 2018-12-30 ENCOUNTER — Encounter: Admit: 2018-12-30 | Discharge: 2018-12-31 | Payer: BLUE CROSS/BLUE SHIELD

## 2018-12-30 ENCOUNTER — Encounter
Admit: 2018-12-30 | Discharge: 2018-12-31 | Payer: BLUE CROSS/BLUE SHIELD | Attending: Physical Medicine & Rehabilitation | Primary: Physical Medicine & Rehabilitation

## 2018-12-30 DIAGNOSIS — M47817 Spondylosis without myelopathy or radiculopathy, lumbosacral region: Principal | ICD-10-CM

## 2018-12-30 DIAGNOSIS — M5416 Radiculopathy, lumbar region: Principal | ICD-10-CM

## 2018-12-31 ENCOUNTER — Encounter
Admit: 2018-12-31 | Discharge: 2018-12-31 | Disposition: A | Discharge status: Home / Self Care | Payer: BLUE CROSS/BLUE SHIELD

## 2018-12-31 ENCOUNTER — Encounter: Admit: 2018-12-31 | Discharge: 2018-12-31 | Payer: BLUE CROSS/BLUE SHIELD

## 2018-12-31 ENCOUNTER — Ambulatory Visit
Admit: 2018-12-31 | Discharge: 2018-12-31 | Disposition: A | Discharge status: Home / Self Care | Payer: BLUE CROSS/BLUE SHIELD

## 2018-12-31 DIAGNOSIS — R10817 Generalized abdominal tenderness: Principal | ICD-10-CM

## 2018-12-31 MED ORDER — CEPHALEXIN 500 MG CAPSULE
ORAL_CAPSULE | Freq: Four times a day (QID) | ORAL | 0 refills | 10 days | Status: CP
Start: 2018-12-31 — End: 2019-01-11

## 2018-12-31 MED ORDER — DOCUSATE SODIUM 100 MG CAPSULE
ORAL_CAPSULE | Freq: Two times a day (BID) | ORAL | 0 refills | 30 days | Status: CP
Start: 2018-12-31 — End: 2019-01-30

## 2018-12-31 MED ORDER — POLYETHYLENE GLYCOL 3350 17 GRAM ORAL POWDER PACKET
PACK | Freq: Every day | ORAL | 0 refills | 30 days | Status: CP
Start: 2018-12-31 — End: 2019-01-30

## 2019-01-01 MED ORDER — FOLIC ACID 1 MG TABLET
ORAL_TABLET | 3 refills | 0 days | Status: CP
Start: 2019-01-01 — End: ?

## 2019-01-11 ENCOUNTER — Encounter: Admit: 2019-01-11 | Discharge: 2019-01-11 | Payer: PRIVATE HEALTH INSURANCE

## 2019-01-11 ENCOUNTER — Encounter: Admit: 2019-01-11 | Discharge: 2019-01-11 | Payer: BLUE CROSS/BLUE SHIELD

## 2019-01-11 DIAGNOSIS — M0579 Rheumatoid arthritis with rheumatoid factor of multiple sites without organ or systems involvement: Principal | ICD-10-CM

## 2019-01-11 DIAGNOSIS — M25561 Pain in right knee: Secondary | ICD-10-CM

## 2019-01-11 MED ORDER — HYDROXYCHLOROQUINE 200 MG TABLET
ORAL_TABLET | Freq: Two times a day (BID) | ORAL | 1 refills | 90.00000 days | Status: CP
Start: 2019-01-11 — End: ?

## 2019-01-11 MED ORDER — METHOTREXATE SODIUM 2.5 MG TABLET
ORAL_TABLET | ORAL | 4 refills | 28.00000 days | Status: CP
Start: 2019-01-11 — End: ?

## 2019-01-11 MED ORDER — PREDNISONE 5 MG TABLET
ORAL_TABLET | Freq: Every day | ORAL | 3 refills | 90.00000 days | Status: CP
Start: 2019-01-11 — End: ?

## 2019-02-01 ENCOUNTER — Encounter: Admit: 2019-02-01 | Discharge: 2019-02-03 | Payer: BLUE CROSS/BLUE SHIELD

## 2019-02-01 DIAGNOSIS — Z8739 Personal history of other diseases of the musculoskeletal system and connective tissue: Secondary | ICD-10-CM

## 2019-02-01 DIAGNOSIS — F329 Major depressive disorder, single episode, unspecified: Secondary | ICD-10-CM

## 2019-02-01 DIAGNOSIS — F17210 Nicotine dependence, cigarettes, uncomplicated: Secondary | ICD-10-CM

## 2019-02-01 DIAGNOSIS — K219 Gastro-esophageal reflux disease without esophagitis: Secondary | ICD-10-CM

## 2019-02-01 DIAGNOSIS — Z20828 Contact with and (suspected) exposure to other viral communicable diseases: Secondary | ICD-10-CM

## 2019-02-01 DIAGNOSIS — N3000 Acute cystitis without hematuria: Secondary | ICD-10-CM

## 2019-02-01 DIAGNOSIS — N390 Urinary tract infection, site not specified: Secondary | ICD-10-CM

## 2019-02-01 DIAGNOSIS — R109 Unspecified abdominal pain: Secondary | ICD-10-CM

## 2019-02-01 DIAGNOSIS — M0609 Rheumatoid arthritis without rheumatoid factor, multiple sites: Secondary | ICD-10-CM

## 2019-02-01 DIAGNOSIS — J449 Chronic obstructive pulmonary disease, unspecified: Secondary | ICD-10-CM

## 2019-02-01 DIAGNOSIS — M4316 Spondylolisthesis, lumbar region: Secondary | ICD-10-CM

## 2019-02-01 DIAGNOSIS — G8929 Other chronic pain: Secondary | ICD-10-CM

## 2019-02-01 DIAGNOSIS — R112 Nausea with vomiting, unspecified: Secondary | ICD-10-CM

## 2019-02-01 DIAGNOSIS — D899 Disorder involving the immune mechanism, unspecified: Secondary | ICD-10-CM

## 2019-02-01 DIAGNOSIS — M48062 Spinal stenosis, lumbar region with neurogenic claudication: Secondary | ICD-10-CM

## 2019-02-01 DIAGNOSIS — F419 Anxiety disorder, unspecified: Secondary | ICD-10-CM

## 2019-02-01 DIAGNOSIS — F1210 Cannabis abuse, uncomplicated: Secondary | ICD-10-CM

## 2019-02-01 DIAGNOSIS — Z7982 Long term (current) use of aspirin: Secondary | ICD-10-CM

## 2019-02-03 MED ORDER — CEPHALEXIN 500 MG CAPSULE
ORAL_CAPSULE | Freq: Four times a day (QID) | ORAL | 0 refills | 3 days | Status: CP
Start: 2019-02-03 — End: 2019-02-06

## 2019-02-03 MED ORDER — ONDANSETRON 4 MG DISINTEGRATING TABLET
ORAL_TABLET | Freq: Four times a day (QID) | ORAL | 0 refills | 4 days | Status: CP | PRN
Start: 2019-02-03 — End: 2019-02-18

## 2019-02-04 ENCOUNTER — Encounter: Admit: 2019-02-04 | Discharge: 2019-02-05 | Payer: BLUE CROSS/BLUE SHIELD

## 2019-02-04 DIAGNOSIS — Z09 Encounter for follow-up examination after completed treatment for conditions other than malignant neoplasm: Secondary | ICD-10-CM

## 2019-02-04 DIAGNOSIS — M48062 Spinal stenosis, lumbar region with neurogenic claudication: Secondary | ICD-10-CM

## 2019-02-04 DIAGNOSIS — M069 Rheumatoid arthritis, unspecified: Secondary | ICD-10-CM

## 2019-02-04 DIAGNOSIS — D899 Disorder involving the immune mechanism, unspecified: Secondary | ICD-10-CM

## 2019-02-04 DIAGNOSIS — Z0289 Encounter for other administrative examinations: Secondary | ICD-10-CM

## 2019-02-04 MED ORDER — ASPIRIN 81 MG CHEWABLE TABLET
ORAL_TABLET | Freq: Every day | ORAL | 7 refills | 36 days | Status: CP
Start: 2019-02-04 — End: 2020-02-04

## 2019-02-04 MED ORDER — TRAMADOL 50 MG TABLET
ORAL_TABLET | Freq: Three times a day (TID) | ORAL | 0 refills | 30 days | Status: CP | PRN
Start: 2019-02-04 — End: ?

## 2019-02-05 ENCOUNTER — Ambulatory Visit: Admit: 2019-02-05 | Discharge: 2019-02-06 | Payer: BLUE CROSS/BLUE SHIELD

## 2019-02-05 DIAGNOSIS — Z20828 Contact with and (suspected) exposure to other viral communicable diseases: Secondary | ICD-10-CM

## 2019-02-05 DIAGNOSIS — F329 Major depressive disorder, single episode, unspecified: Secondary | ICD-10-CM

## 2019-02-05 DIAGNOSIS — N179 Acute kidney failure, unspecified: Secondary | ICD-10-CM

## 2019-02-05 DIAGNOSIS — G8929 Other chronic pain: Secondary | ICD-10-CM

## 2019-02-05 DIAGNOSIS — F1210 Cannabis abuse, uncomplicated: Secondary | ICD-10-CM

## 2019-02-05 DIAGNOSIS — J449 Chronic obstructive pulmonary disease, unspecified: Secondary | ICD-10-CM

## 2019-02-05 DIAGNOSIS — F419 Anxiety disorder, unspecified: Secondary | ICD-10-CM

## 2019-02-05 DIAGNOSIS — R112 Nausea with vomiting, unspecified: Secondary | ICD-10-CM

## 2019-02-05 DIAGNOSIS — M0609 Rheumatoid arthritis without rheumatoid factor, multiple sites: Secondary | ICD-10-CM

## 2019-02-05 DIAGNOSIS — Z791 Long term (current) use of non-steroidal anti-inflammatories (NSAID): Secondary | ICD-10-CM

## 2019-02-05 DIAGNOSIS — N390 Urinary tract infection, site not specified: Secondary | ICD-10-CM

## 2019-02-05 DIAGNOSIS — F17210 Nicotine dependence, cigarettes, uncomplicated: Secondary | ICD-10-CM

## 2019-02-05 DIAGNOSIS — Z7982 Long term (current) use of aspirin: Secondary | ICD-10-CM

## 2019-02-05 DIAGNOSIS — Z7952 Long term (current) use of systemic steroids: Secondary | ICD-10-CM

## 2019-02-05 DIAGNOSIS — R109 Unspecified abdominal pain: Secondary | ICD-10-CM

## 2019-02-05 DIAGNOSIS — I4892 Unspecified atrial flutter: Secondary | ICD-10-CM

## 2019-02-05 DIAGNOSIS — K219 Gastro-esophageal reflux disease without esophagitis: Secondary | ICD-10-CM

## 2019-03-01 ENCOUNTER — Other Ambulatory Visit: Payer: Self-pay

## 2019-03-01 DIAGNOSIS — Z20822 Contact with and (suspected) exposure to covid-19: Secondary | ICD-10-CM

## 2019-03-02 LAB — NOVEL CORONAVIRUS, NAA: SARS-CoV-2, NAA: NOT DETECTED

## 2019-03-09 ENCOUNTER — Ambulatory Visit
Admit: 2019-03-09 | Discharge: 2019-03-10 | Payer: BLUE CROSS/BLUE SHIELD | Attending: Internal Medicine | Primary: Internal Medicine

## 2019-03-09 MED ORDER — HYDROXYCHLOROQUINE 200 MG TABLET: tablet | 3 refills | 0 days | Status: AC

## 2019-03-09 MED ORDER — METHOTREXATE SODIUM 2.5 MG TABLET: 20 mg | tablet | 4 refills | 28 days | Status: AC

## 2019-05-04 ENCOUNTER — Encounter: Admit: 2019-05-04 | Discharge: 2019-05-05 | Payer: BLUE CROSS/BLUE SHIELD

## 2019-05-04 DIAGNOSIS — Z720 Tobacco use: Principal | ICD-10-CM

## 2019-05-04 DIAGNOSIS — F329 Major depressive disorder, single episode, unspecified: Principal | ICD-10-CM

## 2019-05-04 DIAGNOSIS — R109 Unspecified abdominal pain: Principal | ICD-10-CM

## 2019-05-04 DIAGNOSIS — M48062 Spinal stenosis, lumbar region with neurogenic claudication: Principal | ICD-10-CM

## 2019-05-04 DIAGNOSIS — R634 Abnormal weight loss: Principal | ICD-10-CM

## 2019-05-04 DIAGNOSIS — N390 Urinary tract infection, site not specified: Principal | ICD-10-CM

## 2019-05-04 MED ORDER — MIRTAZAPINE 15 MG TABLET
ORAL_TABLET | Freq: Every evening | ORAL | 1 refills | 90 days | Status: CP
Start: 2019-05-04 — End: 2020-05-03

## 2019-05-04 MED ORDER — TRAMADOL 50 MG TABLET
ORAL_TABLET | Freq: Three times a day (TID) | ORAL | 0 refills | 30 days | Status: CP | PRN
Start: 2019-05-04 — End: ?

## 2019-05-06 DIAGNOSIS — R634 Abnormal weight loss: Principal | ICD-10-CM

## 2019-05-06 DIAGNOSIS — R1310 Dysphagia, unspecified: Principal | ICD-10-CM

## 2019-05-10 DIAGNOSIS — Z09 Encounter for follow-up examination after completed treatment for conditions other than malignant neoplasm: Principal | ICD-10-CM

## 2019-05-17 ENCOUNTER — Encounter: Admit: 2019-05-17 | Discharge: 2019-05-17 | Payer: BLUE CROSS/BLUE SHIELD

## 2019-05-17 DIAGNOSIS — M0579 Rheumatoid arthritis with rheumatoid factor of multiple sites without organ or systems involvement: Principal | ICD-10-CM

## 2019-05-17 DIAGNOSIS — M5431 Sciatica, right side: Principal | ICD-10-CM

## 2019-05-17 MED ORDER — GABAPENTIN 300 MG CAPSULE
ORAL_CAPSULE | Freq: Three times a day (TID) | ORAL | 3 refills | 30 days | Status: CP
Start: 2019-05-17 — End: ?

## 2019-05-17 MED ORDER — PREDNISONE 5 MG TABLET
ORAL_TABLET | 2 refills | 0 days | Status: CP
Start: 2019-05-17 — End: ?

## 2019-05-27 DIAGNOSIS — R740 Nonspecific elevation of levels of transaminase and lactic acid dehydrogenase [LDH]: Principal | ICD-10-CM

## 2019-05-27 DIAGNOSIS — M069 Rheumatoid arthritis, unspecified: Principal | ICD-10-CM

## 2019-05-27 DIAGNOSIS — E46 Unspecified protein-calorie malnutrition: Principal | ICD-10-CM

## 2019-05-28 ENCOUNTER — Encounter: Admit: 2019-05-28 | Discharge: 2019-05-29 | Payer: BLUE CROSS/BLUE SHIELD

## 2019-05-28 DIAGNOSIS — G8929 Other chronic pain: Principal | ICD-10-CM

## 2019-05-28 DIAGNOSIS — Z09 Encounter for follow-up examination after completed treatment for conditions other than malignant neoplasm: Principal | ICD-10-CM

## 2019-05-28 DIAGNOSIS — K209 Esophagitis, unspecified: Principal | ICD-10-CM

## 2019-05-28 DIAGNOSIS — D649 Anemia, unspecified: Principal | ICD-10-CM

## 2019-05-28 DIAGNOSIS — E878 Other disorders of electrolyte and fluid balance, not elsewhere classified: Principal | ICD-10-CM

## 2019-05-28 DIAGNOSIS — E43 Unspecified severe protein-calorie malnutrition: Principal | ICD-10-CM

## 2019-05-28 DIAGNOSIS — K449 Diaphragmatic hernia without obstruction or gangrene: Principal | ICD-10-CM

## 2019-05-28 MED ORDER — TRAMADOL 50 MG TABLET
ORAL_TABLET | Freq: Three times a day (TID) | ORAL | 0 refills | 10 days | Status: CP | PRN
Start: 2019-05-28 — End: ?

## 2019-05-28 MED ORDER — ASPIRIN 81 MG CHEWABLE TABLET
ORAL_TABLET | 3 refills | 0 days | Status: CP
Start: 2019-05-28 — End: ?

## 2019-06-01 ENCOUNTER — Encounter: Admit: 2019-06-01 | Discharge: 2019-06-02 | Payer: BLUE CROSS/BLUE SHIELD | Attending: Family | Primary: Family

## 2019-06-03 ENCOUNTER — Encounter: Admit: 2019-06-03 | Discharge: 2019-06-03 | Payer: BLUE CROSS/BLUE SHIELD

## 2019-06-04 ENCOUNTER — Encounter: Admit: 2019-06-04 | Discharge: 2019-06-05 | Payer: BLUE CROSS/BLUE SHIELD

## 2019-06-04 MED ORDER — MIRTAZAPINE 30 MG TABLET
ORAL_TABLET | Freq: Every evening | ORAL | 3 refills | 90 days | Status: CP
Start: 2019-06-04 — End: 2019-07-04

## 2019-06-07 MED ORDER — TRAMADOL 50 MG TABLET
ORAL_TABLET | 0 refills | 0 days | Status: CP
Start: 2019-06-07 — End: 2019-06-04

## 2019-06-08 ENCOUNTER — Encounter
Admit: 2019-06-08 | Discharge: 2019-06-09 | Payer: BLUE CROSS/BLUE SHIELD | Attending: Women's Health | Primary: Women's Health

## 2019-06-08 DIAGNOSIS — N390 Urinary tract infection, site not specified: Principal | ICD-10-CM

## 2019-06-08 DIAGNOSIS — N952 Postmenopausal atrophic vaginitis: Principal | ICD-10-CM

## 2019-06-08 MED ORDER — ESTRADIOL 0.01% (0.1 MG/GRAM) VAGINAL CREAM
11 refills | 0 days | Status: CP
Start: 2019-06-08 — End: ?

## 2019-06-11 MED ORDER — TRAMADOL 50 MG TABLET
ORAL_TABLET | Freq: Three times a day (TID) | ORAL | 0 refills | 15.00000 days | Status: CP | PRN
Start: 2019-06-11 — End: ?

## 2019-06-11 MED ORDER — ERGOCALCIFEROL (VITAMIN D2) 1,250 MCG (50,000 UNIT) CAPSULE
ORAL_CAPSULE | ORAL | 0 refills | 56 days | Status: CP
Start: 2019-06-11 — End: 2020-06-10

## 2019-06-17 ENCOUNTER — Encounter: Admit: 2019-06-17 | Discharge: 2019-06-18 | Payer: BLUE CROSS/BLUE SHIELD

## 2019-06-19 ENCOUNTER — Encounter
Admit: 2019-06-19 | Discharge: 2019-06-20 | Disposition: A | Discharge status: Home / Self Care | Payer: BLUE CROSS/BLUE SHIELD | Attending: Emergency Medicine

## 2019-06-19 MED ORDER — ONDANSETRON 4 MG DISINTEGRATING TABLET
ORAL_TABLET | Freq: Three times a day (TID) | ORAL | 0 refills | 5.00000 days | Status: CP | PRN
Start: 2019-06-19 — End: 2019-06-26

## 2019-06-19 MED ORDER — PROMETHAZINE 25 MG RECTAL SUPPOSITORY
Freq: Four times a day (QID) | RECTAL | 0 refills | 3.00000 days | Status: CP | PRN
Start: 2019-06-19 — End: 2019-06-26

## 2019-06-24 ENCOUNTER — Encounter: Admit: 2019-06-24 | Discharge: 2019-06-26 | Payer: BLUE CROSS/BLUE SHIELD

## 2019-06-26 MED ORDER — NICOTINE (POLACRILEX) 4 MG BUCCAL LOZENGE
BUCCAL | 0 refills | 6 days | Status: CP | PRN
Start: 2019-06-26 — End: 2019-07-26
  Filled 2019-06-26: qty 144, 6d supply, fill #0

## 2019-06-26 MED ORDER — DICYCLOMINE 10 MG CAPSULE
ORAL_CAPSULE | Freq: Four times a day (QID) | ORAL | 0 refills | 8 days | Status: CP | PRN
Start: 2019-06-26 — End: 2019-07-26
  Filled 2019-06-26: qty 30, 8d supply, fill #0

## 2019-06-26 MED FILL — NICOTINE 21 MG/24 HR DAILY TRANSDERMAL PATCH: 28 days supply | Qty: 28 | Fill #0 | Status: AC

## 2019-06-26 MED FILL — DICYCLOMINE 10 MG CAPSULE: 8 days supply | Qty: 30 | Fill #0 | Status: AC

## 2019-06-26 MED FILL — NICORETTE 4 MG BUCCAL LOZENGE: 6 days supply | Qty: 144 | Fill #0 | Status: AC

## 2019-06-27 MED ORDER — NICOTINE 21 MG/24 HR DAILY TRANSDERMAL PATCH
MEDICATED_PATCH | Freq: Every day | TRANSDERMAL | 0 refills | 28 days | Status: CP
Start: 2019-06-27 — End: ?
  Filled 2019-06-26: qty 28, 28d supply, fill #0

## 2019-06-28 DIAGNOSIS — Z09 Encounter for follow-up examination after completed treatment for conditions other than malignant neoplasm: Principal | ICD-10-CM

## 2019-07-02 ENCOUNTER — Encounter: Admit: 2019-07-02 | Discharge: 2019-07-03 | Payer: BLUE CROSS/BLUE SHIELD

## 2019-07-02 DIAGNOSIS — M0579 Rheumatoid arthritis with rheumatoid factor of multiple sites without organ or systems involvement: Principal | ICD-10-CM

## 2019-07-05 DIAGNOSIS — Z09 Encounter for follow-up examination after completed treatment for conditions other than malignant neoplasm: Principal | ICD-10-CM

## 2019-07-06 MED ORDER — ERGOCALCIFEROL (VITAMIN D2) 1,250 MCG (50,000 UNIT) CAPSULE
ORAL_CAPSULE | 1 refills | 0 days | Status: CP
Start: 2019-07-06 — End: ?

## 2019-07-30 MED ORDER — ERGOCALCIFEROL (VITAMIN D2) 1,250 MCG (50,000 UNIT) CAPSULE
ORAL_CAPSULE | 1 refills | 0 days | Status: CP
Start: 2019-07-30 — End: ?

## 2019-08-02 DIAGNOSIS — M0579 Rheumatoid arthritis with rheumatoid factor of multiple sites without organ or systems involvement: Principal | ICD-10-CM

## 2019-08-02 MED ORDER — METHOTREXATE SODIUM 2.5 MG TABLET
ORAL_TABLET | 3 refills | 0 days | Status: CP
Start: 2019-08-02 — End: ?

## 2019-08-04 MED ORDER — TRAMADOL 50 MG TABLET
ORAL_TABLET | Freq: Three times a day (TID) | ORAL | 0 refills | 15 days | Status: CP | PRN
Start: 2019-08-04 — End: ?

## 2019-08-12 ENCOUNTER — Encounter
Admit: 2019-08-12 | Discharge: 2019-08-13 | Payer: BLUE CROSS/BLUE SHIELD | Attending: Physical Medicine & Rehabilitation | Primary: Physical Medicine & Rehabilitation

## 2019-08-12 DIAGNOSIS — M48062 Spinal stenosis, lumbar region with neurogenic claudication: Principal | ICD-10-CM

## 2019-08-12 MED ORDER — SUCRALFATE 100 MG/ML ORAL SUSPENSION
Freq: Four times a day (QID) | ORAL | 11 refills | 11 days | Status: CP
Start: 2019-08-12 — End: 2020-08-11
  Filled 2019-08-12: qty 420, 11d supply, fill #0

## 2019-08-12 MED ORDER — DICYCLOMINE 10 MG CAPSULE
ORAL_CAPSULE | Freq: Four times a day (QID) | ORAL | 11 refills | 8.00000 days | Status: CP | PRN
Start: 2019-08-12 — End: 2019-09-11
  Filled 2019-08-12: qty 30, 8d supply, fill #0

## 2019-08-12 MED FILL — SUCRALFATE 100 MG/ML ORAL SUSPENSION: 11 days supply | Qty: 420 | Fill #0 | Status: AC

## 2019-08-12 MED FILL — DICYCLOMINE 10 MG CAPSULE: 8 days supply | Qty: 30 | Fill #0 | Status: AC

## 2019-08-18 DIAGNOSIS — M0579 Rheumatoid arthritis with rheumatoid factor of multiple sites without organ or systems involvement: Principal | ICD-10-CM

## 2019-08-27 ENCOUNTER — Encounter: Admit: 2019-08-27 | Discharge: 2019-08-28 | Payer: BLUE CROSS/BLUE SHIELD

## 2019-08-27 DIAGNOSIS — Z720 Tobacco use: Principal | ICD-10-CM

## 2019-08-27 DIAGNOSIS — R634 Abnormal weight loss: Principal | ICD-10-CM

## 2019-08-27 DIAGNOSIS — R7989 Other specified abnormal findings of blood chemistry: Principal | ICD-10-CM

## 2019-08-27 DIAGNOSIS — M48062 Spinal stenosis, lumbar region with neurogenic claudication: Principal | ICD-10-CM

## 2019-08-27 DIAGNOSIS — M069 Rheumatoid arthritis, unspecified: Principal | ICD-10-CM

## 2019-08-27 DIAGNOSIS — F329 Major depressive disorder, single episode, unspecified: Principal | ICD-10-CM

## 2019-08-27 DIAGNOSIS — Z1239 Encounter for other screening for malignant neoplasm of breast: Principal | ICD-10-CM

## 2019-08-27 DIAGNOSIS — R112 Nausea with vomiting, unspecified: Principal | ICD-10-CM

## 2019-08-27 DIAGNOSIS — Z124 Encounter for screening for malignant neoplasm of cervix: Principal | ICD-10-CM

## 2019-08-27 MED ORDER — TRAMADOL 50 MG TABLET
ORAL_TABLET | Freq: Three times a day (TID) | ORAL | 2 refills | 30 days | Status: CP | PRN
Start: 2019-08-27 — End: ?

## 2019-08-27 MED ORDER — VITAMIN B-1 100 MG TABLET
ORAL_TABLET | Freq: Every day | ORAL | 3 refills | 100.00000 days | Status: CP
Start: 2019-08-27 — End: ?

## 2019-08-28 ENCOUNTER — Non-Acute Institutional Stay: Admit: 2019-08-28 | Discharge: 2019-08-31 | Payer: BLUE CROSS/BLUE SHIELD

## 2019-08-28 ENCOUNTER — Encounter: Admit: 2019-08-28 | Discharge: 2019-08-31 | Payer: BLUE CROSS/BLUE SHIELD

## 2019-08-28 ENCOUNTER — Ambulatory Visit: Admit: 2019-08-28 | Discharge: 2019-08-31 | Payer: BLUE CROSS/BLUE SHIELD

## 2019-08-28 MED ORDER — HALOPERIDOL 0.5 MG TABLET
ORAL_TABLET | Freq: Two times a day (BID) | ORAL | 0 refills | 2.00000 days | Status: CP
Start: 2019-08-28 — End: 2019-08-28

## 2019-08-28 MED ORDER — OXYCODONE 5 MG TABLET
ORAL_TABLET | Freq: Four times a day (QID) | ORAL | 0 refills | 2 days | Status: CP | PRN
Start: 2019-08-28 — End: 2019-08-28

## 2019-08-28 MED ORDER — FAMOTIDINE 20 MG TABLET
ORAL_TABLET | Freq: Two times a day (BID) | ORAL | 0 refills | 15 days | Status: CP
Start: 2019-08-28 — End: 2019-08-28

## 2019-08-29 MED ORDER — MIRTAZAPINE 30 MG TABLET
ORAL_TABLET | Freq: Every evening | ORAL | 3 refills | 90.00000 days
Start: 2019-08-29 — End: 2019-09-28

## 2019-08-31 MED ORDER — MIRTAZAPINE 15 MG TABLET
ORAL_TABLET | Freq: Every evening | ORAL | 1 refills | 30.00000 days | Status: CP
Start: 2019-08-31 — End: 2019-09-30

## 2019-09-01 DIAGNOSIS — Z09 Encounter for follow-up examination after completed treatment for conditions other than malignant neoplasm: Principal | ICD-10-CM

## 2019-09-06 MED FILL — SUCRALFATE 100 MG/ML ORAL SUSPENSION: 11 days supply | Qty: 420 | Fill #1 | Status: AC

## 2019-09-06 MED FILL — SUCRALFATE 100 MG/ML ORAL SUSPENSION: ORAL | 11 days supply | Qty: 420 | Fill #1

## 2019-09-06 MED FILL — DICYCLOMINE 10 MG CAPSULE: 8 days supply | Qty: 30 | Fill #1 | Status: AC

## 2019-09-07 ENCOUNTER — Encounter: Admit: 2019-09-07 | Discharge: 2019-09-08 | Payer: BLUE CROSS/BLUE SHIELD

## 2019-09-07 DIAGNOSIS — Z09 Encounter for follow-up examination after completed treatment for conditions other than malignant neoplasm: Principal | ICD-10-CM

## 2019-09-07 MED ORDER — FOLIC ACID 1 MG TABLET
ORAL_TABLET | Freq: Every day | ORAL | 3 refills | 100.00000 days | Status: CP
Start: 2019-09-07 — End: 2020-09-06

## 2019-09-07 MED ORDER — CHOLECALCIFEROL (VITAMIN D3) 25 MCG (1,000 UNIT) TABLET
ORAL_TABLET | Freq: Every day | ORAL | 11 refills | 30 days | Status: CP
Start: 2019-09-07 — End: 2020-09-06

## 2019-09-07 MED ORDER — DICYCLOMINE 10 MG CAPSULE
ORAL_CAPSULE | Freq: Four times a day (QID) | ORAL | 11 refills | 8.00000 days | Status: CP | PRN
Start: 2019-09-07 — End: 2019-10-07
  Filled 2019-09-06: qty 30, 8d supply, fill #1

## 2019-09-08 MED ORDER — KNEE SUPPORT BRACE
Freq: Every day | 0 refills | 0.00000 days | Status: CP | PRN
Start: 2019-09-08 — End: ?

## 2019-09-13 ENCOUNTER — Encounter
Admit: 2019-09-13 | Discharge: 2019-09-14 | Payer: BLUE CROSS/BLUE SHIELD | Attending: Physical Medicine & Rehabilitation | Primary: Physical Medicine & Rehabilitation

## 2019-09-13 ENCOUNTER — Encounter: Admit: 2019-09-13 | Discharge: 2019-09-14 | Payer: BLUE CROSS/BLUE SHIELD

## 2019-09-13 DIAGNOSIS — M5416 Radiculopathy, lumbar region: Principal | ICD-10-CM

## 2019-09-17 ENCOUNTER — Encounter: Admit: 2019-09-17 | Discharge: 2019-09-19 | Payer: BLUE CROSS/BLUE SHIELD

## 2019-09-19 MED ORDER — SENNOSIDES 8.6 MG TABLET
ORAL_TABLET | Freq: Two times a day (BID) | ORAL | 0 refills | 30.00000 days | Status: CP
Start: 2019-09-19 — End: 2019-10-19

## 2019-09-19 MED ORDER — MAGNESIUM CITRATE ORAL SOLUTION
Freq: Every day | ORAL | 0 refills | 1 days | Status: CP | PRN
Start: 2019-09-19 — End: 2019-10-19

## 2019-09-19 MED ORDER — POLYETHYLENE GLYCOL 3350 17 GRAM ORAL POWDER PACKET
PACK | Freq: Every day | ORAL | 0 refills | 30.00000 days | Status: CP | PRN
Start: 2019-09-19 — End: 2019-10-19

## 2019-09-22 DIAGNOSIS — Z09 Encounter for follow-up examination after completed treatment for conditions other than malignant neoplasm: Principal | ICD-10-CM

## 2019-09-24 ENCOUNTER — Ambulatory Visit
Admit: 2019-09-24 | Discharge: 2019-09-25 | Payer: BLUE CROSS/BLUE SHIELD | Attending: Internal Medicine | Primary: Internal Medicine

## 2019-09-24 DIAGNOSIS — R1084 Generalized abdominal pain: Principal | ICD-10-CM

## 2019-09-24 DIAGNOSIS — M069 Rheumatoid arthritis, unspecified: Principal | ICD-10-CM

## 2019-09-24 DIAGNOSIS — N952 Postmenopausal atrophic vaginitis: Principal | ICD-10-CM

## 2019-09-24 DIAGNOSIS — N390 Urinary tract infection, site not specified: Principal | ICD-10-CM

## 2019-09-24 DIAGNOSIS — K21 Gastroesophageal reflux disease with esophagitis without hemorrhage: Principal | ICD-10-CM

## 2019-09-24 DIAGNOSIS — Z09 Encounter for follow-up examination after completed treatment for conditions other than malignant neoplasm: Principal | ICD-10-CM

## 2019-09-24 MED ORDER — DICYCLOMINE 20 MG TABLET
ORAL_TABLET | Freq: Four times a day (QID) | ORAL | 3 refills | 90.00000 days | Status: CP | PRN
Start: 2019-09-24 — End: 2020-09-23

## 2019-09-24 MED ORDER — PANTOPRAZOLE 40 MG TABLET,DELAYED RELEASE
ORAL_TABLET | Freq: Every day | ORAL | 3 refills | 90 days | Status: CP
Start: 2019-09-24 — End: ?

## 2019-09-27 MED ORDER — ESTRADIOL 0.01% (0.1 MG/GRAM) VAGINAL CREAM
VAGINAL | 0 refills | 0.00000 days
Start: 2019-09-27 — End: ?

## 2019-09-28 MED ORDER — TRAMADOL 50 MG TABLET
ORAL_TABLET | Freq: Three times a day (TID) | ORAL | 0 refills | 14 days | Status: CP | PRN
Start: 2019-09-28 — End: ?

## 2019-10-08 ENCOUNTER — Ambulatory Visit: Admit: 2019-10-08 | Discharge: 2019-10-09 | Payer: BLUE CROSS/BLUE SHIELD

## 2019-10-08 DIAGNOSIS — M0579 Rheumatoid arthritis with rheumatoid factor of multiple sites without organ or systems involvement: Principal | ICD-10-CM

## 2019-10-08 MED ORDER — NEEDLE (DISP) 25 GAUGE X 5/8"
0 refills | 0 days | Status: CP
Start: 2019-10-08 — End: ?

## 2019-10-08 MED ORDER — SYRINGE (DISPOSABLE) 1 ML
RECTAL | 0 refills | 0.00000 days | Status: CP
Start: 2019-10-08 — End: ?

## 2019-10-08 MED ORDER — EMPTY CONTAINER
0 refills | 0.00000 days | Status: CP
Start: 2019-10-08 — End: ?

## 2019-10-08 MED ORDER — METHOTREXATE SODIUM 25 MG/ML INJECTION SOLUTION
SUBCUTANEOUS | 4 refills | 28.00000 days | Status: CP
Start: 2019-10-08 — End: ?

## 2019-10-11 DIAGNOSIS — M0579 Rheumatoid arthritis with rheumatoid factor of multiple sites without organ or systems involvement: Principal | ICD-10-CM

## 2019-10-12 ENCOUNTER — Ambulatory Visit: Admit: 2019-10-12 | Discharge: 2019-10-13 | Payer: BLUE CROSS/BLUE SHIELD

## 2019-10-12 DIAGNOSIS — M069 Rheumatoid arthritis, unspecified: Principal | ICD-10-CM

## 2019-10-12 DIAGNOSIS — M48062 Spinal stenosis, lumbar region with neurogenic claudication: Principal | ICD-10-CM

## 2019-10-12 DIAGNOSIS — K21 Gastroesophageal reflux disease with esophagitis without hemorrhage: Principal | ICD-10-CM

## 2019-10-12 DIAGNOSIS — R1084 Generalized abdominal pain: Principal | ICD-10-CM

## 2019-10-12 MED ORDER — TRAMADOL 50 MG TABLET
ORAL_TABLET | Freq: Three times a day (TID) | ORAL | 2 refills | 14.00000 days | Status: CP | PRN
Start: 2019-10-12 — End: ?

## 2019-10-12 MED ORDER — SUCRALFATE 100 MG/ML ORAL SUSPENSION
Freq: Four times a day (QID) | ORAL | 11 refills | 11 days | Status: CP
Start: 2019-10-12 — End: 2020-10-11

## 2019-10-12 MED ORDER — MAGNESIUM CITRATE ORAL SOLUTION
Freq: Every day | ORAL | 0 refills | 1.00000 days | Status: CP | PRN
Start: 2019-10-12 — End: 2019-11-11

## 2019-10-19 MED ORDER — ASPIRIN 81 MG CHEWABLE TABLET
ORAL_TABLET | 3 refills | 0 days | Status: CP
Start: 2019-10-19 — End: ?

## 2019-10-21 ENCOUNTER — Encounter
Admit: 2019-10-21 | Discharge: 2019-10-22 | Disposition: A | Discharge status: Home / Self Care | Payer: BLUE CROSS/BLUE SHIELD

## 2019-10-22 MED ORDER — PROMETHAZINE 25 MG RECTAL SUPPOSITORY
Freq: Four times a day (QID) | RECTAL | 0 refills | 3.00000 days | Status: CP | PRN
Start: 2019-10-22 — End: 2019-10-29

## 2019-10-27 DIAGNOSIS — M0579 Rheumatoid arthritis with rheumatoid factor of multiple sites without organ or systems involvement: Principal | ICD-10-CM

## 2019-11-04 MED ORDER — BUPROPION HCL XL 150 MG 24 HR TABLET, EXTENDED RELEASE
ORAL_TABLET | Freq: Every morning | ORAL | 3 refills | 90 days | Status: CP
Start: 2019-11-04 — End: 2020-11-03

## 2019-11-18 ENCOUNTER — Ambulatory Visit: Payer: No Typology Code available for payment source

## 2019-11-18 ENCOUNTER — Emergency Department
Admission: EM | Admit: 2019-11-18 | Discharge: 2019-11-18 | Disposition: A | Discharge status: Home / Self Care | Payer: No Typology Code available for payment source | Attending: Emergency Medicine | Admitting: Emergency Medicine

## 2019-11-18 ENCOUNTER — Encounter: Payer: Self-pay | Admitting: Emergency Medicine

## 2019-11-18 ENCOUNTER — Other Ambulatory Visit: Payer: Self-pay

## 2019-11-18 DIAGNOSIS — M25511 Pain in right shoulder: Secondary | ICD-10-CM | POA: Insufficient documentation

## 2019-11-18 DIAGNOSIS — Z79899 Other long term (current) drug therapy: Secondary | ICD-10-CM | POA: Insufficient documentation

## 2019-11-18 DIAGNOSIS — R519 Headache, unspecified: Secondary | ICD-10-CM | POA: Insufficient documentation

## 2019-11-18 DIAGNOSIS — M542 Cervicalgia: Secondary | ICD-10-CM | POA: Insufficient documentation

## 2019-11-18 DIAGNOSIS — M069 Rheumatoid arthritis, unspecified: Secondary | ICD-10-CM | POA: Insufficient documentation

## 2019-11-18 DIAGNOSIS — Y939 Activity, unspecified: Secondary | ICD-10-CM | POA: Diagnosis not present

## 2019-11-18 DIAGNOSIS — Y999 Unspecified external cause status: Secondary | ICD-10-CM | POA: Diagnosis not present

## 2019-11-18 DIAGNOSIS — Y92410 Unspecified street and highway as the place of occurrence of the external cause: Secondary | ICD-10-CM | POA: Diagnosis not present

## 2019-11-18 DIAGNOSIS — F17210 Nicotine dependence, cigarettes, uncomplicated: Secondary | ICD-10-CM | POA: Insufficient documentation

## 2019-11-18 MED ORDER — OXYCODONE-ACETAMINOPHEN 5-325 MG PO TABS
1.0000 | ORAL_TABLET | Freq: Once | ORAL | Status: AC
  Administered 2019-11-18: 1 via ORAL
  Filled 2019-11-18: qty 1

## 2019-11-18 MED ORDER — LIDOCAINE 5 % EX PTCH: 1.0000 | MEDICATED_PATCH | CUTANEOUS | 0 refills | Status: DC

## 2019-11-18 MED ORDER — BACLOFEN 5 MG PO TABS: 5.0000 mg | ORAL_TABLET | Freq: Three times a day (TID) | ORAL | 0 refills | Status: DC | PRN

## 2019-11-18 MED ORDER — LORAZEPAM 1 MG PO TABS
1.0000 mg | ORAL_TABLET | Freq: Once | ORAL | Status: AC
  Administered 2019-11-18: 1 mg via ORAL
  Filled 2019-11-18: qty 1
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
## 2019-11-19 ENCOUNTER — Ambulatory Visit: Admit: 2019-11-19 | Discharge: 2019-11-20 | Payer: BLUE CROSS/BLUE SHIELD

## 2019-11-19 ENCOUNTER — Ambulatory Visit
Admit: 2019-11-19 | Discharge: 2019-11-20 | Payer: BLUE CROSS/BLUE SHIELD | Attending: Student in an Organized Health Care Education/Training Program | Primary: Student in an Organized Health Care Education/Training Program

## 2019-11-19 DIAGNOSIS — M545 Low back pain: Principal | ICD-10-CM

## 2019-11-19 DIAGNOSIS — M79642 Pain in left hand: Principal | ICD-10-CM

## 2019-11-19 MED ORDER — ARM BRACE
0 refills | 0.00000 days | Status: CP
Start: 2019-11-19 — End: ?

## 2019-11-19 MED ORDER — OXYCODONE 5 MG TABLET
ORAL_TABLET | Freq: Four times a day (QID) | ORAL | 0 refills | 3.00000 days | Status: CP | PRN
Start: 2019-11-19 — End: 2019-11-24

## 2019-11-26 ENCOUNTER — Ambulatory Visit
Admit: 2019-11-26 | Discharge: 2019-11-27 | Payer: PRIVATE HEALTH INSURANCE | Attending: Student in an Organized Health Care Education/Training Program | Primary: Student in an Organized Health Care Education/Training Program

## 2019-11-26 DIAGNOSIS — G8929 Other chronic pain: Secondary | ICD-10-CM

## 2019-11-26 DIAGNOSIS — F329 Major depressive disorder, single episode, unspecified: Principal | ICD-10-CM

## 2019-11-26 DIAGNOSIS — M545 Low back pain: Principal | ICD-10-CM

## 2019-11-26 DIAGNOSIS — M25532 Pain in left wrist: Principal | ICD-10-CM

## 2019-11-26 MED ORDER — BUPROPION HCL XL 150 MG 24 HR TABLET, EXTENDED RELEASE
ORAL_TABLET | Freq: Every morning | ORAL | 3 refills | 90.00000 days | Status: CP
Start: 2019-11-26 — End: 2020-11-25

## 2019-11-26 MED ORDER — HALOPERIDOL 0.5 MG TABLET
ORAL_TABLET | Freq: Every day | ORAL | 3 refills | 90 days | Status: CP
Start: 2019-11-26 — End: 2020-11-25

## 2019-11-26 MED ORDER — CYCLOBENZAPRINE 5 MG TABLET
ORAL_TABLET | Freq: Two times a day (BID) | ORAL | 0 refills | 15 days | Status: CP | PRN
Start: 2019-11-26 — End: 2019-12-26

## 2019-12-20 ENCOUNTER — Ambulatory Visit: Admit: 2019-12-20 | Discharge: 2019-12-21 | Payer: PRIVATE HEALTH INSURANCE

## 2019-12-24 ENCOUNTER — Encounter: Admit: 2019-12-24 | Discharge: 2019-12-25 | Payer: PRIVATE HEALTH INSURANCE

## 2019-12-30 ENCOUNTER — Encounter: Admit: 2019-12-30 | Discharge: 2019-12-31 | Payer: PRIVATE HEALTH INSURANCE

## 2019-12-30 DIAGNOSIS — R911 Solitary pulmonary nodule: Principal | ICD-10-CM

## 2020-01-03 ENCOUNTER — Encounter
Admit: 2020-01-03 | Discharge: 2020-02-01 | Payer: PRIVATE HEALTH INSURANCE | Attending: Rehabilitative and Restorative Service Providers" | Primary: Rehabilitative and Restorative Service Providers"

## 2020-01-03 ENCOUNTER — Ambulatory Visit
Admit: 2020-01-03 | Discharge: 2020-02-01 | Payer: PRIVATE HEALTH INSURANCE | Attending: Rehabilitative and Restorative Service Providers" | Primary: Rehabilitative and Restorative Service Providers"

## 2020-01-04 ENCOUNTER — Ambulatory Visit: Admit: 2020-01-04 | Discharge: 2020-01-05 | Payer: PRIVATE HEALTH INSURANCE

## 2020-01-04 DIAGNOSIS — M48061 Spinal stenosis, lumbar region without neurogenic claudication: Principal | ICD-10-CM

## 2020-01-04 DIAGNOSIS — Z720 Tobacco use: Principal | ICD-10-CM

## 2020-01-04 DIAGNOSIS — F329 Major depressive disorder, single episode, unspecified: Principal | ICD-10-CM

## 2020-01-04 DIAGNOSIS — M069 Rheumatoid arthritis, unspecified: Principal | ICD-10-CM

## 2020-01-13 ENCOUNTER — Ambulatory Visit: Admit: 2020-01-13 | Discharge: 2020-01-14 | Payer: PRIVATE HEALTH INSURANCE

## 2020-02-04 ENCOUNTER — Ambulatory Visit
Admit: 2020-02-04 | Discharge: 2020-03-02 | Payer: PRIVATE HEALTH INSURANCE | Attending: Rehabilitative and Restorative Service Providers" | Primary: Rehabilitative and Restorative Service Providers"

## 2020-02-04 ENCOUNTER — Encounter
Admit: 2020-02-04 | Discharge: 2020-03-02 | Payer: PRIVATE HEALTH INSURANCE | Attending: Rehabilitative and Restorative Service Providers" | Primary: Rehabilitative and Restorative Service Providers"

## 2020-02-14 ENCOUNTER — Ambulatory Visit: Admit: 2020-02-14 | Discharge: 2020-02-14 | Payer: PRIVATE HEALTH INSURANCE

## 2020-02-14 DIAGNOSIS — Z5181 Encounter for therapeutic drug level monitoring: Principal | ICD-10-CM

## 2020-02-14 DIAGNOSIS — Z23 Encounter for immunization: Principal | ICD-10-CM

## 2020-02-14 DIAGNOSIS — M0579 Rheumatoid arthritis with rheumatoid factor of multiple sites without organ or systems involvement: Principal | ICD-10-CM

## 2020-02-14 DIAGNOSIS — D649 Anemia, unspecified: Principal | ICD-10-CM

## 2020-02-14 DIAGNOSIS — M48062 Spinal stenosis, lumbar region with neurogenic claudication: Principal | ICD-10-CM

## 2020-02-14 DIAGNOSIS — R202 Paresthesia of skin: Principal | ICD-10-CM

## 2020-02-14 DIAGNOSIS — R7401 Elevated AST (SGOT): Principal | ICD-10-CM

## 2020-02-14 MED ORDER — FOLIC ACID 1 MG TABLET
ORAL_TABLET | Freq: Every day | ORAL | 3 refills | 100 days | Status: CP
Start: 2020-02-14 — End: 2021-02-13

## 2020-02-14 MED ORDER — METHOTREXATE SODIUM 25 MG/ML INJECTION SOLUTION
SUBCUTANEOUS | 4 refills | 28 days | Status: CP
Start: 2020-02-14 — End: ?

## 2020-04-07 ENCOUNTER — Encounter: Admit: 2020-04-07 | Discharge: 2020-04-07 | Payer: PRIVATE HEALTH INSURANCE

## 2020-04-07 DIAGNOSIS — L299 Pruritus, unspecified: Principal | ICD-10-CM

## 2020-04-07 DIAGNOSIS — M5431 Sciatica, right side: Principal | ICD-10-CM

## 2020-04-07 DIAGNOSIS — F419 Anxiety disorder, unspecified: Principal | ICD-10-CM

## 2020-04-07 DIAGNOSIS — N952 Postmenopausal atrophic vaginitis: Principal | ICD-10-CM

## 2020-04-07 DIAGNOSIS — F32A Depression, unspecified: Principal | ICD-10-CM

## 2020-04-07 DIAGNOSIS — M48062 Spinal stenosis, lumbar region with neurogenic claudication: Principal | ICD-10-CM

## 2020-04-07 DIAGNOSIS — M05772 Rheumatoid arthritis with rheumatoid factor of left ankle and foot without organ or systems involvement: Principal | ICD-10-CM

## 2020-04-07 DIAGNOSIS — M05771 Rheumatoid arthritis with rheumatoid factor of right ankle and foot without organ or systems involvement: Principal | ICD-10-CM

## 2020-04-07 DIAGNOSIS — R82998 Other abnormal findings in urine: Principal | ICD-10-CM

## 2020-04-07 DIAGNOSIS — K21 Gastroesophageal reflux disease with esophagitis without hemorrhage: Principal | ICD-10-CM

## 2020-04-07 DIAGNOSIS — R1084 Generalized abdominal pain: Principal | ICD-10-CM

## 2020-04-07 DIAGNOSIS — M4316 Spondylolisthesis, lumbar region: Principal | ICD-10-CM

## 2020-04-07 DIAGNOSIS — R7303 Prediabetes: Principal | ICD-10-CM

## 2020-04-07 DIAGNOSIS — N390 Urinary tract infection, site not specified: Principal | ICD-10-CM

## 2020-04-07 DIAGNOSIS — R918 Other nonspecific abnormal finding of lung field: Principal | ICD-10-CM

## 2020-04-07 MED ORDER — MIRTAZAPINE 15 MG TABLET
ORAL_TABLET | Freq: Every evening | ORAL | 1 refills | 90.00000 days | Status: CP
Start: 2020-04-07 — End: 2020-05-29
  Filled 2020-04-07: qty 105, 52d supply, fill #0

## 2020-04-07 MED ORDER — TRAMADOL 50 MG TABLET
ORAL_TABLET | Freq: Three times a day (TID) | ORAL | 2 refills | 17.00000 days | Status: CP | PRN
Start: 2020-04-07 — End: 2020-07-11

## 2020-04-07 MED ORDER — DICYCLOMINE 20 MG TABLET
ORAL_TABLET | Freq: Four times a day (QID) | ORAL | 3 refills | 90 days | Status: CP | PRN
Start: 2020-04-07 — End: 2021-04-07
  Filled 2020-04-07: qty 100, 25d supply, fill #0

## 2020-04-07 MED ORDER — SUCRALFATE 100 MG/ML ORAL SUSPENSION
Freq: Four times a day (QID) | ORAL | 11 refills | 11 days | Status: CP
Start: 2020-04-07 — End: 2021-04-07

## 2020-04-07 MED ORDER — GABAPENTIN 300 MG CAPSULE
ORAL_CAPSULE | Freq: Three times a day (TID) | ORAL | 3 refills | 30 days | Status: CP
Start: 2020-04-07 — End: ?
  Filled 2020-04-07: qty 270, 30d supply, fill #0

## 2020-04-07 MED ORDER — HYDROXYZINE HCL 10 MG TABLET
ORAL_TABLET | Freq: Three times a day (TID) | ORAL | 0 refills | 20 days | Status: CP | PRN
Start: 2020-04-07 — End: 2020-07-11
  Filled 2020-04-07: qty 60, 20d supply, fill #0

## 2020-04-07 MED ORDER — METHOCARBAMOL 500 MG TABLET
ORAL_TABLET | Freq: Three times a day (TID) | ORAL | 1 refills | 30.00000 days | Status: CP
Start: 2020-04-07 — End: 2020-07-11

## 2020-04-07 MED ORDER — HYDROXYZINE HCL 25 MG TABLET
ORAL_TABLET | Freq: Three times a day (TID) | ORAL | 0 refills | 30 days | Status: CN
Start: 2020-04-07 — End: ?

## 2020-04-07 MED ORDER — NITROFURANTOIN MONOHYDRATE/MACROCRYSTALS 100 MG CAPSULE
ORAL_CAPSULE | Freq: Two times a day (BID) | ORAL | 0 refills | 7.00000 days | Status: CP
Start: 2020-04-07 — End: 2020-05-02

## 2020-04-07 MED ORDER — PANTOPRAZOLE 40 MG TABLET,DELAYED RELEASE
ORAL_TABLET | Freq: Every day | ORAL | 3 refills | 90.00000 days | Status: CP
Start: 2020-04-07 — End: ?

## 2020-04-07 MED ORDER — BUPROPION HCL XL 150 MG 24 HR TABLET, EXTENDED RELEASE
ORAL_TABLET | Freq: Every morning | ORAL | 3 refills | 90 days | Status: CP
Start: 2020-04-07 — End: 2021-04-07

## 2020-04-07 MED FILL — MIRTAZAPINE 15 MG TABLET: 52 days supply | Qty: 105 | Fill #0 | Status: AC

## 2020-04-07 MED FILL — DICYCLOMINE 20 MG TABLET: 25 days supply | Qty: 100 | Fill #0 | Status: AC

## 2020-04-07 MED FILL — HYDROXYZINE HCL 10 MG TABLET: 20 days supply | Qty: 60 | Fill #0 | Status: AC

## 2020-04-07 MED FILL — GABAPENTIN 300 MG CAPSULE: 30 days supply | Qty: 270 | Fill #0 | Status: AC

## 2020-04-10 MED ORDER — ESTRADIOL 0.01% (0.1 MG/GRAM) VAGINAL CREAM
VAGINAL | 0 refills | 0.00000 days
Start: 2020-04-10 — End: ?

## 2020-04-19 ENCOUNTER — Ambulatory Visit
Admit: 2020-04-19 | Discharge: 2020-04-20 | Payer: PRIVATE HEALTH INSURANCE | Attending: Ambulatory Care | Primary: Ambulatory Care

## 2020-04-19 DIAGNOSIS — M0579 Rheumatoid arthritis with rheumatoid factor of multiple sites without organ or systems involvement: Principal | ICD-10-CM

## 2020-04-19 MED ORDER — RASUVO (PF) 25 MG/0.5 ML SUBCUTANEOUS AUTO-INJECTOR
SUBCUTANEOUS | 6 refills | 56.00000 days | Status: CP
Start: 2020-04-19 — End: 2020-04-27

## 2020-04-21 DIAGNOSIS — M0579 Rheumatoid arthritis with rheumatoid factor of multiple sites without organ or systems involvement: Principal | ICD-10-CM

## 2020-04-27 DIAGNOSIS — M0579 Rheumatoid arthritis with rheumatoid factor of multiple sites without organ or systems involvement: Principal | ICD-10-CM

## 2020-04-27 MED ORDER — OTREXUP (PF) 25 MG/0.4 ML SUBCUTANEOUS AUTO-INJECTOR
SUBCUTANEOUS | 1 refills | 0 days | Status: CP
Start: 2020-04-27 — End: ?
  Filled 2020-05-09: qty 4.8, 84d supply, fill #0

## 2020-04-27 MED ORDER — EMPTY CONTAINER
2 refills | 0 days
Start: 2020-04-27 — End: ?

## 2020-04-28 DIAGNOSIS — M0579 Rheumatoid arthritis with rheumatoid factor of multiple sites without organ or systems involvement: Principal | ICD-10-CM

## 2020-05-01 NOTE — Unmapped (Signed)
[facility] Specialty Medication Onboarding    Specialty Medication: Otrexup 25mg /0.1ml atin  Prior Authorization: Not Required   Financial Assistance: Yes - copay card approved as secondary   Final Copay/Day Supply: $0 / 84 days    Insurance Restrictions: None     Notes to Pharmacist:     The triage team has completed the benefits investigation and has determined that the patient is able to fill this medication at [facility]. Please contact the patient to complete the onboarding or follow up with the prescribing physician as needed.

## 2020-05-02 ENCOUNTER — Encounter: Admit: 2020-05-02 | Discharge: 2020-05-03 | Payer: PRIVATE HEALTH INSURANCE

## 2020-05-02 DIAGNOSIS — M48062 Spinal stenosis, lumbar region with neurogenic claudication: Principal | ICD-10-CM
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
## 2020-05-02 NOTE — Unmapped (Signed)
Recommend lumbar fusion.  As you would not like surgery at this time, please follow up if you would like to discuss surgery.  In the interim, recommend nicotine cessation

## 2020-05-04 NOTE — Unmapped (Signed)
[facility]   Patient Onboarding/Medication Counseling    Bethany Meyer is a 64 y.o. female with seropositive rheumatoid arthritis who I am counseling today on initiation of therapy.  I am speaking to the patient.    Was a Nurse, learning disability used for this call? No    Verified patient's date of birth / HIPAA.    Specialty medication(s) to be sent: Inflammatory Disorders: Otrexup      Non-specialty medications/supplies to be sent: sharps kit       Otrexup (methotrexate injection)    Medication & Administration     Dosage: Inject the contents of one auto-injector (25mg ) under the skin every 7 days    Administration:     Auto-injector pen  1. Gather all supplies needed for injection on a clean, flat working surface: medication pen, alcohol swabs, sharps container, etc.  2. Look at the medication label ??? look for correct medication, correct dose, and check the expiration date  3. Look at the medication ??? the liquid in the viewing window of the pen device should appear clear and yellow in color  4. Select injection site ??? you can use the front of your thigh or your belly (but not the area 2 inches around your belly button)  5. Prepare injection site ??? wash your hands and clean the skin at the injection site with an alcohol swab and let it air dry, do not touch the injection site again before the injection  6. Twist off the safety cap marked with the number 1, this will break the seal; then remove the safety clip marked with the number 2; the pen is now ready to use  7. Place the needle end against your cleaned injection site at a 90 degree angle; firmly push the device against the injection site until fully depressed to start the injection ??? you will hear a click; hold pen in place for 3 seconds  8. After 3 full seconds, verify the viewing window now shows a red flag to indicate the dose is complete, then pull the pen away from your injection site  9. Dispose of the used auto-injector immediately in your sharps disposal container  10. If you see any blood at the injection site, press a cotton ball or gauze on the site and maintain pressure until the bleeding stops, do not rub the injection site      Adherence/Missed dose instructions:  If your injection is given more than 2 days after your scheduled injection date ??? consult your pharmacist for additional instructions on how to adjust your dosing schedule.    Goals of Therapy     ??? Achieve symptom remission  ??? Slow disease progression  ??? Protection of remaining articular structures  ??? Maintenance of function  ??? Maintenance of effective psychosocial functioning    Side Effects & Monitoring Parameters     ??? Upset stomach, diarrhea, nausea or throwing up  ??? Feeling dizzy, tired, or weak  ??? Hair thinning (reversible)  ??? Minor cold-like symptoms  ??? Photosensitivity ??? use sunscreen and avoid prolonged exposure    The following side effects should be reported to the provider:  ??? Signs of a hypersensitivity reaction ??? rash; hives; itching; red, swollen, blistered, or peeling skin; wheezing; tightness in the chest or throat; difficulty breathing, swallowing, or talking; swelling of the mouth, face, lips, tongue, or throat; etc.  ??? Reduced immune function ??? report signs of infection such as fever; chills; body aches; very bad sore  throat; ear or sinus pain; cough; more sputum or change in color of sputum; pain with passing urine; wound that will not heal, etc.  Also at a slightly higher risk of some malignancies (mainly skin and blood cancers) due to this reduced immune function.  o In the case of signs of infection ??? the patient should hold the next dose of Otrexup?? and call your primary care provider to ensure adequate medical care.  Treatment may be resumed when infection is treated and patient is asymptomatic.  ??? Signs of bleeding ??? throwing up or coughing up blood; blood in the urine; black, red, or tarry stool; abnormal vaginal bleeding; bruises without a cause or that get bigger  ??? Signs of pancreatitis ??? sudden very bad stomach pain, unexplained vomiting  ??? Signs of kidney dysfunction ??? unable to pass urine; blood in the urine; sudden weight gain  ??? Signs of liver dysfunction ??? darkened urine; upset stomach; light-colored stool; yellow skin or eyes  ??? Signs of nerve problems ??? burning; numbness; tingling  ??? Pinpoint red spots on the skin  ??? Changes in eyesight      Contraindications, Warnings, & Precautions     ??? Have your bloodwork checked as you have been told by your prescriber  ??? Talk with your doctor if you are pregnant, planning to become pregnant, or breastfeeding ??? women taking this medication must use birth control while taking this drug and for some time after the last dose  ??? Discuss the possible need for holding your dose(s) of methotrexate when a planned procedure is scheduled with the prescriber as it may delay healing/recovery timeline       Drug/Food Interactions     ??? Medication list reviewed in Epic. The patient was instructed to inform the care team before taking any new medications or supplements. No drug interactions identified.     Storage, Handling Precautions, & Disposal     ??? Store intact pens at room temperature  ??? Protect from light  ??? Dispose of used pens in a sharps disposal container        Current Medications (including OTC/herbals), Comorbidities and Allergies     Current Outpatient Medications   Medication Sig Dispense Refill   ??? acetaminophen (TYLENOL) 325 MG tablet Take 325 mg by mouth two (2) times a day as needed for pain.     ??? arm brace Misc Boxer splint, metacarpal wrist brace 1 each 0   ??? aspirin 81 MG chewable tablet CHEW 1 TABLET BY MOUTH DAILY 36 tablet 3   ??? buPROPion (WELLBUTRIN XL) 150 MG 24 hr tablet Take 1 tablet (150 mg total) by mouth every morning. 90 tablet 3   ??? cholecalciferol, vitamin D3 25 mcg, 1,000 units,, (CHOLECALCIFEROL-25 MCG, 1,000 UNIT,) 1,000 unit (25 mcg) tablet Take 1 tablet (25 mcg total) by mouth daily. 30 tablet 11   ??? dicyclomine (BENTYL) 20 mg tablet Take 1 tablet (20 mg total) by mouth four times a day as needed (stomach cramps). 360 tablet 3   ??? estradioL (ESTRACE) 0.01 % (0.1 mg/gram) vaginal cream Insert 2 g into the vagina Two (2) times a week. Insert 2 g inside vagina and apply a pea-sized amount around urethra twice weekly  0   ??? folic acid (FOLVITE) 1 MG tablet Take 1 tablet (1 mg total) by mouth daily. 100 tablet 3   ??? gabapentin (NEURONTIN) 300 MG capsule Take 3 capsules (900 mg total) by mouth Three times a day. 270 capsule  3   ??? haloperidoL (HALDOL) 0.5 MG tablet Take 1 tablet (0.5 mg total) by mouth every morning before breakfast. 90 tablet 3   ??? hydrOXYzine (ATARAX) 10 MG tablet Take 1 tablet (10 mg total) by mouth Three times a day as needed for itching. 60 tablet 0   ??? leg brace (KNEE SUPPORT BRACE) Misc 1 each by Miscellaneous route daily as needed. 1 each 0   ??? methocarbamoL (ROBAXIN) 500 MG tablet Take 1 tablet (500 mg total) by mouth Three times a day. 90 tablet 1   ??? methotrexate, PF, (OTREXUP, PF,) 25 mg/0.4 mL [name] Inject the contents of 1 syringe (25 mg) under the skin every seven (7) days. (Patient not taking: Reported on 05/02/2020) 4.8 mL 1   ??? mirtazapine (REMERON) 15 MG tablet Take 2 tablets (30 mg total) by mouth nightly. 180 tablet 1   ??? needle, disp, 25 gauge 25 gauge x 5/8 Ndle For methotrexate injections 100 each 0   ??? pantoprazole (PROTONIX) 40 MG tablet Take 1 tablet (40 mg total) by mouth daily. 90 tablet 3   ??? sucralfate (CARAFATE) 100 mg/mL suspension Take 10 mL (1 g total) by mouth Four times a day (before meals and nightly). 420 mL 11   ??? traMADoL (ULTRAM) 50 mg tablet Take 1 tablet (50 mg total) by mouth every eight hours as needed for pain. 50 tablet 2   ??? VITAMIN B-1 100 mg tablet Take 1 tablet (100 mg total) by mouth daily. 100 tablet 3     No current facility-administered medications for this visit.       No Known Allergies    Patient Active Problem List   Diagnosis ??? Tobacco use disorder   ??? GERD (gastroesophageal reflux disease)   ??? Gastritis   ??? Functional Abdominal pain   ??? Anxiety   ??? Depression   ??? Rheumatoid arthritis (CMS-HCC)   ??? Atrial flutter (CMS-HCC)   ??? Pain of toe of left foot   ??? Immunosuppressed status (CMS-HCC)   ??? Rheumatoid arthritis involving both feet (CMS-HCC)   ??? 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 Intractable nausea and vomiting   ??? Piriformis syndrome of both sides   ??? Nausea and vomiting   ??? Spinal stenosis of lumbar region with neurogenic claudication   ??? Spondylolisthesis of lumbar region   ??? Urinary tract infection   ??? PAIN MEDICATION SIGNED WITH [facility]   ??? AKI (acute kidney injury) (CMS-HCC)   ??? Generalized abdominal pain   ??? Duodenal ulcer disease   ??? Paraesophageal hernia   ??? Osteomyelitis (CMS-HCC)   ??? Severe protein-calorie malnutrition (CMS-HCC)   ??? Slow transit constipation       Reviewed and up to date in Epic.    Appropriateness of Therapy     Is medication and dose appropriate based on diagnosis? Yes    Prescription has been clinically reviewed: Yes    Baseline Quality of Life Assessment      Rheumatology:   Quality of Life    On a scale of 1 ??? 10 with 1 representing not at all and 10 representing completely ??? how has your rheumatologic condition affected your:  Daily pain level?: decline to answer  Ability to complete your regular daily tasks (prepare meals, get dressed, etc.)?: decline to answer  Ability to participate in social or family activities?: decline to answer         Financial Information  Medication Assistance provided: Copay Assistance    Anticipated copay of $0.00 / 84 days reviewed with patient. Verified delivery address.    Delivery Information     Scheduled delivery date: 05/10/2020    Expected start date: 05/11/2020    Medication will be delivered via UPS to the prescription address in [facility].  This shipment will not require a signature.      Explained the services we provide at [facility] and that each month we would call to set up refills.  Stressed importance of returning phone calls so that we could ensure they receive their medications in time each month.  Informed patient that we should be setting up refills 7-10 days prior to when they will run out of medication.  A pharmacist will reach out to perform a clinical assessment periodically.  Informed patient that a welcome packet and a drug information handout will be sent.      Patient verbalized understanding of the above information as well as how to contact the pharmacy at [phone] option 4 with any questions/concerns.  The pharmacy is open Monday through Friday 8:30am-4:30pm.  A pharmacist is available 24/7 via pager to answer any clinical questions they may have.    Patient Specific Needs     - Does the patient have any physical, cognitive, or cultural barriers? No    - Patient prefers to have medications discussed with  Patient     - Is the patient or caregiver able to read and understand education materials at a high school level or above? Yes    - Patient's primary language is  English     - Is the patient high risk? No    - Does the patient require a Care Management Plan? No     - Does the patient require physician intervention or other additional services (i.e. nutrition, smoking cessation, social work)? No      Karene Fry Clements Toro  [facility] Specialty Pharmacist

## 2020-05-09 MED FILL — EMPTY CONTAINER: 120 days supply | Qty: 1 | Fill #0

## 2020-05-09 MED FILL — EMPTY CONTAINER: 120 days supply | Qty: 1 | Fill #0 | Status: AC

## 2020-05-09 MED FILL — OTREXUP (PF) 25 MG/0.4 ML SUBCUTANEOUS AUTO-INJECTOR: 84 days supply | Qty: 5 | Fill #0 | Status: AC

## 2020-05-24 ENCOUNTER — Encounter: Admit: 2020-05-24 | Discharge: 2020-05-25 | Payer: PRIVATE HEALTH INSURANCE

## 2020-05-24 DIAGNOSIS — R918 Other nonspecific abnormal finding of lung field: Principal | ICD-10-CM

## 2020-05-24 MED ADMIN — iohexoL (OMNIPAQUE) 350 mg iodine/mL solution 75 mL: 75 mL | INTRAVENOUS | @ 20:00:00 | Stop: 2020-05-24

## 2020-06-17 NOTE — Unmapped (Signed)
Assessment/Plan:   Bethany Meyer is a 65 y.o. 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    1. Seropositive (+CCP) non erosive RA   Patient with adequate control of disease, she does note severe pain in her legs -- suspect that this is secondary to her spinal stenosis given improvement with leaning forward. Will recheck inflammatory markers today, we discussed that potentially could consider stopping Ritux if inflammatory markers are normal.   - continue  MTX 25mg  weekly subcutaneous  - Continue folic acid 1mg  daily  - had ritux 1g q14 days x2 doses with improvement in symptoms (12/30/19 and 01/13/20), will plan to repeat in 6 months (06/2020) with 1 g pending inflammatory markers.     2.  Spinal stenosis  Patient with an MRI from 12/24/19 demonstrating severe spinal canal stenosis and foraminal narrowing at L4-5.  She has previously seen spinal center and had an injection, but she notes did not seem to help dramatically.  She is currently enrolled in physical therapy which she notes has helped. She did see neurosurgery and would like more time to consider.     3. Paresthesias   She has had prior work up including vitamin B12 729 from 06/24/2018, SPEP within normal limits on immunofixation. The paresthesias started in her feet with numbness but now can be felt throughout her body.  It is also this could be secondary to underlying pain from her stenosis although that would not explain her whole body paresthesias.   -- we discussed to be thorough could check an RPR -- patient is agreeable.     4. Tobacco use  She is smoking at least a pack per day.   -- smoking cessation encouraged    5. Normocytic anemia   --will check CBC today     6. Elevated AST  -re-check CMP today     Health Care Maintenance:  Routine health maintenance discussed  Immunization History   Administered Date(s) Administered Comments   ??? COVID-19 VACC,MRNA,(PFIZER)(PF)(IM) 08/06/2019 [facility]   ???  08/27/2019 [facility]   ??? COVID-19 VACCINE,MRNA(MODERNA)(PF)(IM) 04/07/2020    ??? Hepatitis B, Adult 01/03/2015    ???  03/13/2015    ??? Influenza Vaccine Quad (IIV4 PF) 95mo+ injectable 02/10/2013    ???  05/03/2014    ???  03/13/2015    ???  03/20/2016    ???  07/12/2017    ???  03/11/2018    ???  02/14/2020    ??? Influenza Virus Vaccine, unspecified formulation 02/10/2013    ???  05/03/2014    ???  03/13/2015    ???  03/20/2016    ???  07/12/2017    ???  03/11/2018    ??? Influenza, High Dose (IIV4) 65 yrs & older 03/03/2019    ??? PNEUMOCOCCAL POLYSACCHARIDE 23 01/11/2015    ??? Pneumococcal conjugate -PCV7 02/18/2012    ??? TdaP 01/01/2011    Deferred Date(s) Deferred Comments   ??? Influenza Vaccine Quad (IIV4 PF) 16mo+ injectable 05/17/2019    ???  06/24/2019 Pt remember she had a dose already last year.  See immunization record.           -COVID-19: has had two (finished in March) and booster           - PCV 7: 02/18/12 -- listed as PCV7 -- at next visit will determine if she has had PCV 13.           -  PCV 23: 01/11/15          -Influenza: will obtain today (although she has had recently and will try to get booster and potentially a second dose of influenza vaccine prior to next Ritux infusion 05/2020).     CDC recommends all immunosuppressed adults receive vaccination against pneumonococcus.. Adults 19 years or older who have not received any pneumococcal vaccine, should get a dose of PCV13 first and should also continue to receive the recommended doses of PPSV23. Adults 19 years or older who have previously received one or more doses of PPSV23, should also receive a dose of PCV13 and should continue to receive the remaining recommended doses of PPSV23.    Diagnoses and all orders for this visit:  Medication monitoring encounter  - CBC w/ Differential  - Comprehensive Metabolic Panel  - CRP  C-Reactive Protein  - ESR Sed rate    Rheumatoid arthritis involving both feet with negative rheumatoid factor (CMS-HCC) (positive CCP)   - CBC w/ Differential  - Comprehensive Metabolic Panel  - CRP  C-Reactive Protein  - ESR Sed rate    Paresthesia of both lower extremities  - Syphilis Screen    Encounter for smoking cessation counseling  - nicotine (NICODERM CQ) 14 mg/24 hr patch; Place 1 patch on the skin daily.  - nicotine polacrilex (NICORETTE) 2 mg gum; Apply 1 each (2 mg total) to cheek every two (2) hours as needed for smoking cessation.      RTC in Return in about 4 months (around 10/17/2020).    I appreciate the opportunity to participate and collaborate in the care of this patient.      History of Present Illness:     Primary Care Provider: Karrie Doffing, PA    HPI:  Bethany Meyer is a 65 y.o.  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  ??  As background, Ms. Wicklund's joint pain first started around May/June 2016 with right knee pain and swelling, then right foot and ankle swelling. She is RF negative, CCP positive. She was started on methotrexate but had persistent joint pain and swelling so was started on Humira in March 2017. She is on methotrexate 20mg  weekly po (have been unable to get SQ approved by insurance). ??She was increased to weekly Humira but continued to have joint pain and swelling. ??Enbrel was not approved by insurance so prescription was sent for [name] instead in 2018. [name] was initially effective but the patient experienced several flares while on this medication. This was discontinued in May 2019 and she was started on Cimzia in June 2019. Cimzia was discontinued in summer 2020 due to lack of perceived benefit from medication.    Last visit was fall 2021, since then she has been switched to Rasuvo for ease of injection.     She has been able to go to physical therapy. She notes that her feet are so numb that she is not able to sleep at night. She has been taking some sleeping medications.     Fourth methotrexate shot was yesterday. What bothers her the most is the numbness and muscle spasms. She notes that her feet are very numb. She goes from 1030PM to 130 AM and then will have to walk around. Pain improved with leaning forward.     She notes that her hands are doing fine. Sometimes her left hand will clench up. She does  not that moving the left hand seems to help. She notes that she does have a bathroom in her bedroom. Sometimes she has an accident as her legs are numb and hurt so much. She tries to do things     Numbness is under the foot. She notes that it will feel like there is a rock on her foot. The numbness shoots down from the legs. She does not stiffness and notes that she cannot lay down.     Balance of 10 systems was reviewed and is negative except for that mentioned in the HPI.    Review of records: I have reviewed labs/images/clinic notes per the computerized medical record.   X-ray: IMPRESSION:  -Subtle cortical irregularity at the second middle phalanx at the PIP joint, which is only visualized on oblique views and may be artifactual. Recommend correlation with point tenderness if fracture in this region is suspected.    Allergies:  Patient has no known allergies.    Medications:     Current Outpatient Medications:   ???  acetaminophen (TYLENOL) 325 MG tablet, Take 325 mg by mouth two (2) times a day as needed for pain., Disp: , Rfl:   ???  aspirin 81 MG chewable tablet, CHEW 1 TABLET BY MOUTH DAILY, Disp: 36 tablet, Rfl: 3  ???  buPROPion (WELLBUTRIN XL) 150 MG 24 hr tablet, Take 1 tablet (150 mg total) by mouth every morning., Disp: 90 tablet, Rfl: 3  ???  cholecalciferol, vitamin D3 25 mcg, 1,000 units,, (CHOLECALCIFEROL-25 MCG, 1,000 UNIT,) 1,000 unit (25 mcg) tablet, Take 1 tablet (25 mcg total) by mouth daily., Disp: 30 tablet, Rfl: 11  ???  dicyclomine (BENTYL) 20 mg tablet, Take 1 tablet (20 mg total) by mouth four times a day as needed (stomach cramps)., Disp: 360 tablet, Rfl: 3  ???  estradioL (ESTRACE) 0.01 % (0.1 mg/gram) vaginal cream, Insert 2 g into the vagina Two (2) times a week. Insert 2 g inside vagina and apply a pea-sized amount around urethra twice weekly, Disp: , Rfl: 0  ???  folic acid (FOLVITE) 1 MG tablet, Take 1 tablet (1 mg total) by mouth daily., Disp: 100 tablet, Rfl: 3  ???  gabapentin (NEURONTIN) 300 MG capsule, Take 3 capsules (900 mg total) by mouth Three times a day., Disp: 270 capsule, Rfl: 3  ???  haloperidoL (HALDOL) 0.5 MG tablet, Take 1 tablet (0.5 mg total) by mouth every morning before breakfast., Disp: 90 tablet, Rfl: 3  ???  hydrOXYzine (ATARAX) 10 MG tablet, Take 1 tablet (10 mg total) by mouth Three times a day as needed for itching., Disp: 60 tablet, Rfl: 0  ???  leg brace (KNEE SUPPORT BRACE) Misc, 1 each by Miscellaneous route daily as needed., Disp: 1 each, Rfl: 0  ???  methocarbamoL (ROBAXIN) 500 MG tablet, Take 1 tablet (500 mg total) by mouth Three times a day., Disp: 90 tablet, Rfl: 1  ???  methotrexate, PF, (OTREXUP, PF,) 25 mg/0.4 mL [name], Inject the contents of 1 pen (25mg ) under the skin every 7 days, Disp: 4.8 mL, Rfl: 1  ???  pantoprazole (PROTONIX) 40 MG tablet, Take 1 tablet (40 mg total) by mouth daily., Disp: 90 tablet, Rfl: 3  ???  sucralfate (CARAFATE) 100 mg/mL suspension, Take 10 mL (1 g total) by mouth Four times a day (before meals and nightly)., Disp: 420 mL, Rfl: 11  ???  traMADoL (ULTRAM) 50 mg tablet, Take 1 tablet (50 mg total) by mouth every eight hours as  needed for pain., Disp: 50 tablet, Rfl: 2  ???  VITAMIN B-1 100 mg tablet, Take 1 tablet (100 mg total) by mouth daily., Disp: 100 tablet, Rfl: 3  ???  arm brace Misc, Boxer splint, metacarpal wrist brace, Disp: 1 each, Rfl: 0  ???  empty container Misc, Use as directed, Disp: 1 each, Rfl: 2  ???  mirtazapine (REMERON) 15 MG tablet, Take 2 tablets (30 mg total) by mouth nightly. (Patient not taking: Reported on 06/19/2020), Disp: 180 tablet, Rfl: 1  ???  needle, disp, 25 gauge 25 gauge x 5/8 Ndle, For methotrexate injections, Disp: 100 each, Rfl: 0  ???  nicotine (NICODERM CQ) 14 mg/24 hr patch, Place 1 patch on the skin daily., Disp: 28 patch, Rfl: 1  ???  nicotine polacrilex (NICORETTE) 2 mg gum, Apply 1 each (2 mg total) to cheek every two (2) hours as needed for smoking cessation., Disp: 110 each, Rfl: 0    Medical History:  Past Medical History:   Diagnosis Date   ??? Arthritis     RA   ??? Cataract    ??? Functional dyspepsia    ??? GERD (gastroesophageal reflux disease)    ??? Gout    ??? RA (rheumatoid arthritis) (CMS-HCC)    ??? Smoking    ??? Stage 1 mild COPD by GOLD classification (CMS-HCC) 02/25/2017       Surgical History:  Past Surgical History:   Procedure Laterality Date   ??? ovarian cyst removed     ??? OVARY SURGERY     ??? PR COLONOSCOPY FLX DX W/COLLJ SPEC WHEN PFRMD N/A 03/05/2013    Procedure: COLONOSCOPY, FLEXIBLE, PROXIMAL TO SPLENIC FLEXURE; DIAGNOSTIC, W/WO COLLECTION SPECIMEN BY BRUSH OR WASH;  Surgeon: Trudie Buckler, MD;  Location: GI PROCEDURES [facility];  Service: Gastroenterology   ??? PR COMPRE EP EVAL ABLTJ 3D MAPG TX SVT N/A 12/26/2015    Procedure: A-Flutter Ablation;  Surgeon: Theresia Majors, MD;  Location: [facility] EP;  Service: Cardiology   ??? PR UP GI ENDOSCOPY,BALL DIL,30MM N/A 06/03/2019    Procedure: UGI ENDO; W/BALLOON DILAT ESOPHAGUS (<30MM DIAM);  Surgeon: Neysa Hotter, MD;  Location: [facility];  Service: Gastroenterology   ??? PR UPPER GI ENDOSCOPY,BIOPSY N/A 09/16/2012    Procedure: UGI ENDOSCOPY; WITH BIOPSY, SINGLE OR MULTIPLE;  Surgeon: Brown Human, MD;  [facility]: [facility];  Service: Gastroenterology   ??? PR UPPER GI ENDOSCOPY,BIOPSY N/A 06/03/2019    Procedure: UGI ENDOSCOPY; WITH BIOPSY, SINGLE OR MULTIPLE;  Surgeon: Neysa Hotter, MD;  Location: [facility] PROCED[facility];  Service: Gastroenterology   ??? PR UPPER GI ENDOSCOPY,DIAGNOSIS N/A 06/03/2019    Procedure: UGI ENDO, INCLUDE ESOPHAGUS, STOMACH, & DUODENUM &/OR JEJUNUM; DX W/WO COLLECTION SPECIMN, BY BRUSH OR WASH;  Surgeon: Neysa Hotter, MD;  Location: [facility];  Service: Gastroenterology       Social History:  Social History     Tobacco Use   ??? Smoking status: Current Every Day Smoker     Packs/day: 1.00     Years: 35.00     Pack years: 35.00     Types: Cigarettes   ??? Smokeless tobacco: Never Used   ??? Tobacco comment: Started smoking at 89,    Substance Use Topics   ??? Alcohol use: No     Alcohol/week: 0.0 standard drinks   ??? Drug use: Yes     Types: Marijuana  Comment: once a month       Family History:  Family History   Problem Relation Age of Onset   ??? Diabetes Mother         type 2   ??? Breast cancer Mother    ??? Diabetes Father         type 2   ??? Cataracts Father    ??? Macular degeneration Neg Hx    ??? Retinal detachment Neg Hx    ??? Colon cancer Neg Hx    ??? Endometrial cancer Neg Hx    ??? Ovarian cancer Neg Hx    ??? Glaucoma Neg Hx    ??? Blindness Neg Hx            Objective   Vitals:    06/19/20 0916   BP: 138/96   Pulse: 76   Temp: 36.2 ??C   TempSrc: Temporal   Weight: 55.6 kg (122 lb 9.6 oz)   Height: 168.9 cm (5' 6.5)       Physical Exam:  General: Well-appearing.  HEENT: Sclera anicteric;   Pulmonary: Unremarkable respiratory effort. CTAB.  Cardiac: S1 and S2 present, RRR. No murmurs/rubs/gallops.  Neuro: Moving all four extremities, able to walk to the exam table independently although with a limp. Unable to tell which way big toe is pointing on left foot for proprioception.    Psych: Euthymic affect.  MSK: No overt bony deformities or gross abnormalities in range of motion. Patient with increased pain with lying back and improved pain when leaning forward.    ??? Hands: No swelling or tenderness to palpation of the MCPs, PIPs, or DIPs.  ??? Wrists: No swelling or tenderness to palpation of the wrists or deficit in range of motion.  ??? Elbows: No swelling or tenderness to palpation of the elbows.  ??? Shoulders: No swelling or tenderness to palpation of the bilateral shoulders.Marland Kitchen  ??? Hips: No swelling or tenderness to palpation of the lateral hips or over the femur.   ??? Knees: No swelling or tenderness to palpation of the bilateral knees.  ??? Ankles: No swelling or tenderness to palpation of the bilateral ankles.  ??? Feet: No swelling or tenderness to palpation of the ankle or MTPs.  Skin: No evidence of erythema or rash.  Extremities: Warm, no LE edema.    Test Results  Lab Results   Component Value Date    ANA Negative 11/30/2014       Lab Results   Component Value Date    CKTOTAL 208.0 (H) 02/14/2020    CRP <4.0 06/19/2020    ESR 55 (H) 06/19/2020       Lab Results   Component Value Date    CREATUR 180.6 07/02/2019    PROTEINUR <4.0 08/27/2019    PCRATIOUR 0.087 07/02/2019       No results found for: EJO1, ERNP, ESCL, ESMA, ESSA, ESSB    Lab Results   Component Value Date    HBSAG Nonreactive 12/23/2014    HEPBSAB Reactive (A) 06/23/2019    QFTTBGOLD Negative 10/01/2017    HEPCAB Nonreactive 06/23/2019       Lab Results   Component Value Date    NITRITE Positive (A) 04/07/2020    PROTEINUA Negative 04/07/2020    RBCUA <1 04/07/2020    WBC 5.8 06/19/2020    BLOODU Negative 04/07/2020    KETONESU Negative 04/07/2020       No results found for: ANCA, ANCAIFA, MPO, MPOQT, PR3ELISA

## 2020-06-19 ENCOUNTER — Encounter: Admit: 2020-06-19 | Discharge: 2020-06-19 | Payer: PRIVATE HEALTH INSURANCE

## 2020-06-19 DIAGNOSIS — R202 Paresthesia of skin: Principal | ICD-10-CM

## 2020-06-19 DIAGNOSIS — M06071 Rheumatoid arthritis without rheumatoid factor, right ankle and foot: Principal | ICD-10-CM

## 2020-06-19 DIAGNOSIS — Z5181 Encounter for therapeutic drug level monitoring: Principal | ICD-10-CM

## 2020-06-19 DIAGNOSIS — M0579 Rheumatoid arthritis with rheumatoid factor of multiple sites without organ or systems involvement: Principal | ICD-10-CM

## 2020-06-19 DIAGNOSIS — M06072 Rheumatoid arthritis without rheumatoid factor, left ankle and foot: Principal | ICD-10-CM

## 2020-06-19 DIAGNOSIS — Z716 Tobacco abuse counseling: Principal | ICD-10-CM

## 2020-06-19 LAB — CBC W/ AUTO DIFF
BASOPHILS ABSOLUTE COUNT: 0.1 10*9/L (ref 0.0–0.1)
BASOPHILS RELATIVE PERCENT: 0.9 %
EOSINOPHILS ABSOLUTE COUNT: 0.2 10*9/L (ref 0.0–0.7)
EOSINOPHILS RELATIVE PERCENT: 2.7 %
HEMATOCRIT: 37.2 % (ref 35.0–44.0)
HEMOGLOBIN: 12.2 g/dL (ref 12.0–15.5)
LYMPHOCYTES ABSOLUTE COUNT: 1.5 10*9/L (ref 0.7–4.0)
LYMPHOCYTES RELATIVE PERCENT: 25.2 %
MEAN CORPUSCULAR HEMOGLOBIN CONC: 32.7 g/dL (ref 30.0–36.0)
MEAN CORPUSCULAR HEMOGLOBIN: 30.7 pg (ref 26.0–34.0)
MEAN CORPUSCULAR VOLUME: 93.7 fL (ref 82.0–98.0)
MEAN PLATELET VOLUME: 8 fL (ref 7.0–10.0)
MONOCYTES ABSOLUTE COUNT: 0.6 10*9/L (ref 0.1–1.0)
MONOCYTES RELATIVE PERCENT: 9.7 %
NEUTROPHILS ABSOLUTE COUNT: 3.6 10*9/L (ref 1.7–7.7)
NEUTROPHILS RELATIVE PERCENT: 61.5 %
NUCLEATED RED BLOOD CELLS: 0 /100{WBCs} (ref <=4)
PLATELET COUNT: 358 10*9/L (ref 150–450)
RED BLOOD CELL COUNT: 3.97 10*12/L (ref 3.90–5.03)
RED CELL DISTRIBUTION WIDTH: 18.8 % — ABNORMAL HIGH (ref 12.0–15.0)
WBC ADJUSTED: 5.8 10*9/L (ref 3.5–10.5)

## 2020-06-19 LAB — COMPREHENSIVE METABOLIC PANEL
ALBUMIN: 3.6 g/dL (ref 3.4–5.0)
ALKALINE PHOSPHATASE: 70 U/L (ref 46–116)
ALT (SGPT): 9 U/L — ABNORMAL LOW (ref 10–49)
ANION GAP: 4 mmol/L — ABNORMAL LOW (ref 5–14)
AST (SGOT): 26 U/L (ref <=34)
BILIRUBIN TOTAL: 0.3 mg/dL (ref 0.3–1.2)
BLOOD UREA NITROGEN: 21 mg/dL (ref 9–23)
BUN / CREAT RATIO: 28
CALCIUM: 9.9 mg/dL (ref 8.7–10.4)
CHLORIDE: 114 mmol/L — ABNORMAL HIGH (ref 98–107)
CO2: 24.7 mmol/L (ref 20.0–31.0)
CREATININE: 0.75 mg/dL
EGFR CKD-EPI AA FEMALE: >90 mL/min/{1.73_m2} (ref >=60)
EGFR CKD-EPI NON-AA FEMALE: 85 mL/min/{1.73_m2} (ref >=60)
GLUCOSE RANDOM: 78 mg/dL (ref 70–99)
POTASSIUM: 4.3 mmol/L (ref 3.4–4.5)
PROTEIN TOTAL: 7 g/dL (ref 5.7–8.2)
SODIUM: 143 mmol/L (ref 135–145)

## 2020-06-19 LAB — C-REACTIVE PROTEIN: C-REACTIVE PROTEIN: <4 mg/L (ref <=10.0)

## 2020-06-19 LAB — SEDIMENTATION RATE, MANUAL: ERYTHROCYTE SEDIMENTATION RATE: 55 mm/h — ABNORMAL HIGH (ref 0–30)

## 2020-06-19 MED ORDER — NICOTINE 14 MG/24 HR DAILY TRANSDERMAL PATCH
MEDICATED_PATCH | TRANSDERMAL | 1 refills | 28 days | Status: CP
Start: 2020-06-19 — End: ?

## 2020-06-19 MED ORDER — NICOTINE (POLACRILEX) 2 MG GUM
BUCCAL | 0 refills | 10 days | Status: CP | PRN
Start: 2020-06-19 — End: 2020-07-19

## 2020-06-19 NOTE — Unmapped (Addendum)
Dear Bethany Meyer    It was nice to see you!     Today in clinic we discussed the following:     1. We are going to get bloodwork today. Depending on the blood work you may not need the Rituximab infusion in February.    2. Quitting smoking is one of the most important things that you do and you've done it before!! There are resources to help including [place] quitline: 1-800-QUIT-NOW 726-433-0821);         Website: DesigningShop.co.za  3.  Continue using the methotrexate once a week and daily folic acid     Have a nice day!     Johnanna Schneiders MD   [facility]  [address]  [place], [place] [address]  Phone: [phone]  Fax: [phone]       Patient Education     Fact Sheet for Patients, Parents And Caregivers   Emergency Use Authorization (EUA) of EVUSHELD???   (tixagevimab co-packaged with cilgavimab)   for Coronavirus Disease 2019 (COVID-19)    You are being given this Fact Sheet because your healthcare provider believes it is necessary to provide you with EVUSHELD (tixagevimab co-packaged with cilgavimab) for pre-exposure prophylaxis for prevention of coronavirus disease 2019 (COVID-19) caused by the SARS-CoV-2 virus.    This Fact Sheet contains information to help you understand the potential risks and potential benefits of taking EVUSHELD, which you have received or may receive.    The U.S. Food and Drug Administration (FDA) has issued an Emergency Use Authorization (EUA) to make EVUSHELD available during the COVID-19 pandemic (for more details about an EUA please see What is an Emergency Use Authorization? at the end of this document). EVUSHELD is not an FDA-approved medicine in the [place].    Read this Fact Sheet for information about EVUSHELD. Talk to your healthcare provider if you have any questions. It is your choice to receive or not receive EVUSHELD.    What is COVID-19?  COVID-19 is caused by a virus called a coronavirus. You can get COVID-19 through close contact with another person who has the virus.    COVID-19 illnesses have ranged from very mild (including some with no reported symptoms) to severe, including illness resulting in death. While information so far suggests that most COVID-19 illness is mild, serious illness can happen and may cause some of your other medical conditions to become worse. Older people and people of all ages with severe, long-lasting (chronic) medical conditions like heart disease, lung disease, and diabetes, for example, seem to be at higher risk of being hospitalized for COVID-19.    What is EVUSHELD (tixagevimab co-packaged with cilgavimab)?  EVUSHELD is an investigational medicine used in adults and adolescents (77 years of age and older who weigh at least 88 pounds [40 kg]) for pre-exposure prophylaxis for prevention of COVID-19 in persons who are:  ?? not currently infected with SARS-CoV-2 and who have not had recent known close contact with someone who is infected with SARS-CoV-2 and  ?? Who have moderate to severe immune compromise due to a medical condition or have received immunosuppressive medicines or treatments and may not mount an adequate immune response to COVID-19 vaccination or  ?? For whom vaccination with any available COVID-19 vaccine, according to the approved or authorized schedule, is not recommended due to a history of severe adverse reaction (such as severe allergic reaction) to a COVID-19 vaccine(s) or COVID-19 vaccine ingredient(s).    EVUSHELD  is investigational because it is still being studied. There is limited information known about the safety and effectiveness of using EVUSHELD for pre-exposure prophylaxis for prevention of COVID-19. EVUSHELD is not authorized for post-exposure prophylaxis for prevention of COVID-19.    The FDA has authorized the emergency use of EVUSHELD for pre-exposure prophylaxis for prevention of COVID-19 under an Emergency Use Authorization (EUA).    What should I tell my healthcare provider before I receive EVUSHELD?  Tell your healthcare provider if you:  ?? Have any allergies  ?? Have low numbers of blood platelets (which help blood clotting), a bleeding disorder, or are taking anticoagulants (to prevent blood clots)  ?? Have had a heart attack or stroke, have other heart problems, or are at high-risk of cardiac (heart) events  ?? Are pregnant or plan to become pregnant  ?? Are breastfeeding a child  ?? Have any serious illness  ?? Are taking any medications (prescription, over-the-counter, vitamins, or herbal products)     How will I receive EVUSHELD?  ?? EVUSHELD consists of two investigational medicines, tixagevimab and cilgavimab.  ?? You will receive 1 dose of EVUSHELD, consisting of 2 separate injections (tixagevimab and cilgavimab).  ?? EVUSHELD will be given to you by your healthcare provider as 2 intramuscular injections. They are usually, given one after the other, 1 into each of your buttocks.    After the initial dose, if your healthcare provider determines that you need to receive additional doses of EVUSHELD for ongoing protection, the additional doses would be administered once every 6 months.    Who should generally not take EVUSHELD?  Do not take EVUSHELD if you have had a severe allergic reaction to EVUSHELD or any ingredient in EVUSHELD.    What are the important possible side effects of EVUSHELD?  Possible side effects of EVUSHELD are:  ?? Allergic reactions. Allergic reactions can happen during and after injection of EVUSHELD. Tell your healthcare provider right away if you get any of the following signs and symptoms of allergic reactions: fever, chills, nausea, headache, shortness of breath, low or high blood pressure, rapid or slow heart rate, chest discomfort or pain, weakness, confusion, feeling tired, wheezing, swelling of your lips, face, or throat, rash including hives, itching, muscle aches, dizziness and sweating. These reactions may be severe or life threatening.  ?? Cardiac (heart) events: Serious cardiac adverse events have happened, but were not common, in people who received EVUSHELD and also in people who did not receive EVUSHELD in the clinical trial studying pre-exposure prophylaxis for prevention of COVID-19. In people with risk factors for cardiac events (including a history of heart attack), more people who received EVUSHELD experienced serious cardiac events than people who did not receive EVUSHELD. It is not known if these events are related to [facility] or underlying medical conditions. Contact your healthcare provider or get medical help right away if you get any symptoms of cardiac events, including pain, pressure, or discomfort in the chest, arms, neck, back, stomach or jaw, as well as shortness of breath, feeling tired or weak (fatigue), feeling sick (nausea), or swelling in your ankles or lower legs.    The side effects of getting any medicine by intramuscular injection may include pain, bruising of the skin, soreness, swelling, and possible bleeding or infection at the injection site.    These are not all the possible side effects of EVUSHELD. Not a lot of people have been given EVUSHELD. Serious and unexpected side effects may happen. EVUSHELD is  still being studied so it is possible that all of the risks are not known at this time.    It is possible that EVUSHELD may reduce your body's immune response to a COVID-19 vaccine. If you have received a COVID-19 vaccine, you should wait to receive EVUSHELD until at least 2 weeks after COVID-19 vaccination.    What other prevention choices are there?  Vaccines to prevent COVID-19 are approved or available under Emergency Use Authorization. Use of EVUSHELD does not replace vaccination against COVID-19. For more information about other medicines authorized for treatment or prevention of COVID-19 go to https://garcia.[email or web address] for more information.    It is your choice to receive or not receive EVUSHELD. Should you decide not to receive EVUSHELD, it will not change your standard medical care.    EVUSHELD is not authorized for post-exposure prophylaxis of COVID-19.    What if I am pregnant or breastfeeding?  If you are pregnant or breastfeeding, discuss your options and specific situation with your healthcare provider.    How do I report side effects with EVUSHELD?  Contact your healthcare provider if you have any side effects that bother you or do not go away. Report side effects to FDA MedWatch at MacRetreat.be or call 1-800-FDA-1088 or call [employer] at 3378668635.    Additional Information  If you have questions, visit the website or call the telephone number provided below.    Website  Telephone number    http://www.evusheld.com  [phone]      How can I learn more about COVID-19?  ?? Ask your healthcare provider.  ?? Visit: [email or web address]    ?? Contact your local or state public health department.    What is an Emergency Use Authorization?  The [place] FDA has made EVUSHELD (tixagevimab co-packaged with cilgavimab) available under an emergency access mechanism called an Emergency Use Authorization EUA. The EUA is supported by a Surveyor, minerals and Human Service (HHS) declaration that circumstances exist to justify the emergency use of drugs and biological products during the COVID-19 pandemic.    EVUSHELD for pre-exposure prophylaxis for prevention of coronavirus disease 2019 (COVID-19) caused by the SARS-CoV-2 virus has not undergone the same type of review as an FDA-approved product. In issuing an EUA under the COVID-19 public health emergency, the FDA has determined, among other things, that based on the total amount of scientific evidence available including data from adequate and well-controlled clinical trials, if available, it is reasonable to believe that the product may be effective for diagnosing, treating, or preventing COVID-19, or a serious or life-threatening disease or condition caused by COVID-19; that the known and potential benefits of the product, when used to diagnose, treat, or prevent such disease or condition, outweigh the known and potential risks of such product; and that there are no adequate, approved and available alternatives.    All of these criteria must be met to allow for the product to be used in the treatment of patients during the COVID-19 pandemic. The EUA for EVUSHELD is in effect for the duration of the COVID-19 declaration justifying emergency use of EVUSHELD,  unless terminated or revoked (after which EVUSHELD may no longer be used under the EUA).    Distributed by: [employer], [place], [place] [address]    Manufactured by: [employer], [address], [place], [place] [address], [place]    ??[employer] 2021. All rights reserved.

## 2020-06-20 LAB — SYPHILIS SCREEN: SYPHILIS RPR SCREEN: NONREACTIVE

## 2020-06-23 DIAGNOSIS — M0579 Rheumatoid arthritis with rheumatoid factor of multiple sites without organ or systems involvement: Principal | ICD-10-CM

## 2020-06-23 MED ORDER — CYCLOBENZAPRINE 5 MG TABLET
Freq: Every evening | ORAL | 0.00000 days | PRN
Start: 2020-06-23 — End: ?

## 2020-06-27 NOTE — Unmapped (Signed)
I called the patient to discuss lab results and Ritux.     She notes that her legs and hip pain.    We discussed her recent lab work and we also discussed that one of her inflammatory markers was elevated.  At this point I shared with her the risks and benefits of continued with rituximab including risk of infection with reactivation of shingles, and also risk of MS like disease (JC virus) that is rare 1:200K.  She expressed understanding.  At this point she has no plans for scheduled surgery.

## 2020-07-05 NOTE — Unmapped (Signed)
I called the patient as I heard that the [facility] infusion scheduling team was unable to get ahold of her. Mailbox was full and could not leave a message.     Sent the following Mychart message   I hope this message finds you well. I just tried calling your phone and so I thought I would send you a message.     The infusion scheduling team was having a hard time getting in contact with you -- they can be reached at [phone] to set up your Ritux infusion.     Please let me know if you have any questions or concerns,  Bethany Meyer

## 2020-07-11 ENCOUNTER — Encounter: Admit: 2020-07-11 | Discharge: 2020-07-12 | Payer: PRIVATE HEALTH INSURANCE

## 2020-07-11 DIAGNOSIS — K5900 Constipation, unspecified: Principal | ICD-10-CM

## 2020-07-11 DIAGNOSIS — M05772 Rheumatoid arthritis with rheumatoid factor of left ankle and foot without organ or systems involvement: Principal | ICD-10-CM

## 2020-07-11 DIAGNOSIS — R634 Abnormal weight loss: Principal | ICD-10-CM

## 2020-07-11 DIAGNOSIS — M05771 Rheumatoid arthritis with rheumatoid factor of right ankle and foot without organ or systems involvement: Principal | ICD-10-CM

## 2020-07-11 LAB — TOXICOLOGY SCREEN, URINE
AMPHETAMINE SCREEN URINE: POSITIVE — AB
BARBITURATE SCREEN URINE: NEGATIVE
BENZODIAZEPINE SCREEN, URINE: NEGATIVE
BUPRENORPHINE, URINE SCREEN: NEGATIVE
CANNABINOID SCREEN URINE: POSITIVE — AB
COCAINE(METAB.)SCREEN, URINE: NEGATIVE
FENTANYL SCREEN, URINE: NEGATIVE
METHADONE SCREEN, URINE: NEGATIVE
OPIATE SCREEN URINE: NEGATIVE
OXYCODONE SCREEN URINE: NEGATIVE

## 2020-07-11 MED ORDER — METHOCARBAMOL 500 MG TABLET
ORAL_TABLET | Freq: Three times a day (TID) | ORAL | 1 refills | 30.00000 days | Status: CP
Start: 2020-07-11 — End: ?
  Filled 2020-07-11: qty 90, 30d supply, fill #0

## 2020-07-11 MED ORDER — TRAMADOL 50 MG TABLET
ORAL_TABLET | Freq: Three times a day (TID) | ORAL | 2 refills | 17.00000 days | Status: CP | PRN
Start: 2020-07-11 — End: ?
  Filled 2020-07-11: qty 50, 17d supply, fill #0

## 2020-07-11 MED ORDER — HYDROXYZINE HCL 10 MG TABLET
ORAL_TABLET | Freq: Three times a day (TID) | ORAL | 0 refills | 20.00000 days | Status: CP | PRN
Start: 2020-07-11 — End: ?
  Filled 2020-07-11: qty 60, 20d supply, fill #0

## 2020-07-11 MED FILL — DICYCLOMINE 20 MG TABLET: ORAL | 25 days supply | Qty: 100 | Fill #1

## 2020-07-11 MED FILL — MIRTAZAPINE 15 MG TABLET: ORAL | 52 days supply | Qty: 105 | Fill #1

## 2020-07-11 MED FILL — GABAPENTIN 300 MG CAPSULE: ORAL | 30 days supply | Qty: 270 | Fill #1

## 2020-07-11 NOTE — Unmapped (Signed)
[facility] at [facility]       Type of visit:  face to face    Reason for visit: follow up    Questions / Concerns that need to be addressed: none voiced    General Consent to Treat (GCT) for non-epic video visits only: Verbal consent    Screening BP- 117/70    Allergies reviewed: Yes    Medication reviewed: Yes  Pended refills? Yes    HCDM reviewed and updated in Epic:    We are working to make sure all of our patients??? wishes are updated in Epic and part of that is documenting a Environmental health practitioner for each patient  A Health Care Decision Rodena Piety is someone you choose who can make health care decisions for you if you are not able ??? who would you most want to do this for you????  is already up to date.    BPAs completed:  AUDIT - Alcohol Screen  HARK - Interpersonal Violence  Falls Risk - adults 65+    COVID-19 Vaccine Summary  Which COVID-19 Vaccine was administered  Pfizer  Type:  Dates Given:  04/07/2020    Immunization History   Administered Date(s) Administered   ??? COVID-19 VACC,MRNA,(PFIZER)(PF)(IM) 08/06/2019, 08/27/2019   ??? COVID-19 VACCINE,MRNA(MODERNA)(PF)(IM) 06/29/2019, 07/27/2019, 04/07/2020   ??? Hepatitis B, Adult 01/03/2015, 03/13/2015   ??? Influenza Vaccine Quad (IIV4 PF) 79mo+ injectable 02/10/2013, 05/03/2014, 03/13/2015, 03/20/2016, 07/12/2017, 03/11/2018, 02/14/2020   ??? Influenza Virus Vaccine, unspecified formulation 02/10/2013, 05/03/2014, 03/13/2015, 03/20/2016, 07/12/2017, 03/11/2018   ??? Influenza, High Dose (IIV4) 65 yrs & older 03/03/2019   ??? PNEUMOCOCCAL POLYSACCHARIDE 23 01/11/2015   ??? Pneumococcal conjugate -PCV7 02/18/2012   ??? TdaP 01/01/2011   __________________________________________________________________________________________    SCREENINGS COMPLETED IN FLOWSHEETS    HARK Screening  HARK Screening  Within the last year, have you been humiliated or emotionally abused in other ways by your partner or ex-partner?: No  Within the last year, have you been afraid of your partner or ex-partner?: No  Within the last year, have you been raped or forced to have any kind of sexual activity by your partner or ex-partner?: No  Within the last year, have you been kicked, hit, slapped, or otherwise physically hurt by your partner or ex-partner?: No    AUDIT  AUDIT - C Score (Part 1): 0    Falls Risk  Falls Risk  Have you fallen in the past year?: No  Do you feel unsteady when standing or walking?: (!) Yes

## 2020-07-11 NOTE — Unmapped (Addendum)
It was great seeing you today!    1. Please continue using Miralax twice a day, Senna (two tablets nightly), and Ducolax for your constipation. Please also try adding Ducolax suppository to your regimen. This can be found over the counter  2. You will be contacted to schedule a colonoscopy   3. You will be contacted to schedule a mammogram  4. Your medication refills have been sent to our [facility] clinic pharmacy.     We will see you again in 6-8 weeks

## 2020-07-11 NOTE — Unmapped (Signed)
Internal Medicine Clinic Visit    Reason for visit: Follow up     A/P:  Ms. Bethany Meyer is a 65yo F with PMH RA, COPD, spinal stenosis with neurogenic claudication, atrial flutter, depression/anxiety, gastritis/GERD and tobacco abuse here for follow up.    Weight loss - Constipation   Patient with 11 lb W loss since last visit approximately 1 month ago. Patient endorses poor appetite due to radiating abdominal/lower back pain. Could not appreciate RUQ/epigastric tenderness to suggest GB pathology or pancreatitis. Patient with significant constipation and can appreciate stool burden on exam. Would like to rule out malignancy/obstruction which could be contributing to W loss  -     Colonoscopy ordered   - Recommend continuing OTC Senna, Miralax, Ducolax - also trial of Ducolax suppository     Poor Sleep - Depression - Anxiety   Continues to endorse poor sleep, <3 hours/night due to back pain. Has not tried Atarax as she was unable to pick up her prescription since prior visit  -     hydrOXYzine (ATARAX) 10 MG tablet; Take 1 tablet (10 mg total) by mouth Three times a day as needed for itching.  - Continue Mirtazapine 30 mg nightly     Spinal Stenosis - Back Pain   She saw neurosurgery who recommended operative intervention though patient remains hesitant about procedure. Suspect that severe back pain with radiating abdominal/leg pain is contributing to poor sleep, poor appetite, and anxiety which is affecting her quality of life. Offered referral for a second opinion outside of [facility] to discuss operative intervention though patient plans to talk to a spinal surgeon with her workman's comp on 2/28 for a second opinion. She would like to discuss with them first prior to [place] placing another referral   - Patient to discuss operative intervention with workman's comp Spinal Surgeon for second opinion; will hold off on placing another referral from our end at this time  - Continue working with physical therapy 2x a week.  - methocarbamoL (ROBAXIN) 500 MG tablet; Take 1 tablet (500 mg total) by mouth Three times a day.  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 Patient prefers mobile phone #    Follow up in 6-8 weeks     Patient seen and discussed with Laural Benes, Mitzi Davenport, MD  Internal Medicine PGY1  Pager: [phone]    __________________________________________________________    HPI:  Ms. Bethany Meyer is a 65yo F with PMH RA, COPD, spinal stenosis with neurogenic claudication, atrial flutter, depression/anxiety, gastritis/GERD and tobacco abuse here for follow up. Since her last visit she has been seen by Neurosurgery regarding spinal stenosis. She is still hesitant about operative intervention and plans to talk to a spinal surgeon with her workman's comp on 2/28 for a second opinion.  She also follows with  Rheumatology for her RA/Spinal Stenosis/Parasthesias.     Main concerns - Pain 8/10 starts in lower back, radiates to abdomen and radiates down to R leg. Pulling sensation, unchanged from prior visits. Numbness in hands, legs, feet.  Working with physical therapy 2x a week. Not having much  of an appetite due to pain. Eating makes the pain worse. W loss 11 lb since last last visit. Constipation - has tried senna, miralax BID, ducolax. Last BM was 6-7 days ago.      Still not sleeping, gets  <3 hours/night. Wakes up in the middle of the night because of the pain. The only thing that helps with pain is warm compress or warm bath. Did increase Mirtazapine dose. Has not helped much with depression, sleep, anxiety. Has not tried atarax yet - thought [facility] was [facility] but [facility] was sent to [facility] pharmacy. Continues to smoke tobacco and marijuana.    Denies headaches, changes in vision/hearing, CP, SOB, blood in stool, burning with urination, hematuria, rashes     __________________________________________________________    Problem List:  Patient Active Problem List   Diagnosis   ??? Tobacco use disorder   ??? GERD (gastroesophageal reflux disease)   ??? Gastritis   ??? Functional Abdominal pain   ??? Anxiety   ??? Depression   ??? Rheumatoid arthritis (CMS-HCC)   ??? Atrial flutter (CMS-HCC)   ??? Pain of toe of left foot   ??? Immunosuppressed status (CMS-HCC)   ??? Rheumatoid arthritis involving both feet (CMS-HCC)   ??? 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 Intractable nausea and vomiting   ??? Piriformis syndrome of both sides   ??? Nausea and vomiting   ??? Spinal stenosis of lumbar region with neurogenic claudication   ??? Spondylolisthesis of lumbar region   ??? Urinary tract infection   ??? PAIN MEDICATION SIGNED WITH [facility]   ??? AKI (acute kidney injury) (CMS-HCC)   ??? Generalized abdominal pain   ??? Duodenal ulcer disease   ??? Paraesophageal hernia   ??? Osteomyelitis (CMS-HCC)   ??? Severe protein-calorie malnutrition (CMS-HCC)   ??? Slow transit constipation       Medications:  Reviewed in EPIC  __________________________________________________________    Physical Exam:   Vital Signs:  Vitals:    07/11/20 0907   BP: 117/70   Pulse: 82   Resp: 16   Temp: 36.8 ??C   SpO2: 99%   Weight: 50.6 kg (111 lb 9.6 oz)   Height: 168.9 cm (5' 6.5)       Gen: Thin appearing female, NAD  CV: Regular rate, Irregular rhythm  Pulm: CTA bilaterally, no crackles or wheezes  Abd: +tenderness to lower abdomen BL, appreciate some stool burden, no rebound/guarding.   Ext: No edema      PHQ-9 Score:  PHQ-9 TOTAL SCORE: 2

## 2020-07-23 DIAGNOSIS — M0579 Rheumatoid arthritis with rheumatoid factor of multiple sites without organ or systems involvement: Principal | ICD-10-CM

## 2020-07-26 NOTE — Unmapped (Signed)
[facility] Clinical Assessment & Refill Coordination Note    Bethany Meyer, DOB: 1955/06/02  Phone: [phone] (home)     All above HIPAA information was verified with patient.     Was a Nurse, learning disability used for this call? No    Specialty Medication(s):   Inflammatory Disorders: Otrexup     Current Outpatient Medications   Medication Sig Dispense Refill   ??? acetaminophen (TYLENOL) 325 MG tablet Take 325 mg by mouth two (2) times a day as needed for pain.     ??? arm brace Misc Boxer splint, metacarpal wrist brace 1 each 0   ??? aspirin 81 MG chewable tablet CHEW 1 TABLET BY MOUTH DAILY 36 tablet 3   ??? buPROPion (WELLBUTRIN XL) 150 MG 24 hr tablet Take 1 tablet (150 mg total) by mouth every morning. 90 tablet 3   ??? cholecalciferol, vitamin D3 25 mcg, 1,000 units,, (CHOLECALCIFEROL-25 MCG, 1,000 UNIT,) 1,000 unit (25 mcg) tablet Take 1 tablet (25 mcg total) by mouth daily. 30 tablet 11   ??? cyclobenzaprine (FLEXERIL) 5 MG tablet Take 5-10 mg by mouth nightly as needed.     ??? dicyclomine (BENTYL) 20 mg tablet Take 1 tablet (20 mg total) by mouth four times a day as needed (stomach cramps). 360 tablet 3   ??? empty container Misc Use as directed 1 each 2   ??? estradioL (ESTRACE) 0.01 % (0.1 mg/gram) vaginal cream Insert 2 g into the vagina Two (2) times a week. Insert 2 g inside vagina and apply a pea-sized amount around urethra twice weekly  0   ??? folic acid (FOLVITE) 1 MG tablet Take 1 tablet (1 mg total) by mouth daily. 100 tablet 3   ??? gabapentin (NEURONTIN) 300 MG capsule Take 3 capsules (900 mg total) by mouth Three times a day. 270 capsule 3   ??? haloperidoL (HALDOL) 0.5 MG tablet Take 1 tablet (0.5 mg total) by mouth every morning before breakfast. 90 tablet 3   ??? hydrOXYzine (ATARAX) 10 MG tablet Take 1 tablet (10 mg total) by mouth Three times a day as needed for itching. 60 tablet 0   ??? leg brace (KNEE SUPPORT BRACE) Misc 1 each by Miscellaneous route daily as needed. 1 each 0   ??? methocarbamoL (ROBAXIN) 500 MG tablet Take 1 tablet (500 mg total) by mouth Three times a day. 90 tablet 1   ??? methotrexate, PF, (OTREXUP, PF,) 25 mg/0.4 mL [name] Inject the contents of 1 pen (25mg ) under the skin every 7 days 4.8 mL 1   ??? mirtazapine (REMERON) 15 MG tablet Take 2 tablets (30 mg total) by mouth nightly. (Patient not taking: Reported on 06/19/2020) 180 tablet 1   ??? needle, disp, 25 gauge 25 gauge x 5/8 Ndle For methotrexate injections 100 each 0   ??? nicotine (NICODERM CQ) 14 mg/24 hr patch Place 1 patch on the skin daily. 28 patch 1   ??? pantoprazole (PROTONIX) 40 MG tablet Take 1 tablet (40 mg total) by mouth daily. 90 tablet 3   ??? sucralfate (CARAFATE) 100 mg/mL suspension Take 10 mL (1 g total) by mouth Four times a day (before meals and nightly). 420 mL 11   ??? traMADoL (ULTRAM) 50 mg tablet Take 1 tablet (50 mg total) by mouth every eight hours as needed for pain. 50 tablet 2   ??? VITAMIN B-1 100 mg tablet Take 1 tablet (100 mg total) by mouth daily. (Patient not taking: Reported on 07/11/2020) 100  tablet 3     No current facility-administered medications for this visit.        Changes to medications: Leith reports no changes at this time.    No Known Allergies    Changes to allergies: No    SPECIALTY MEDICATION ADHERENCE     Otrexup 25mg /0.44ml: 7 days of medicine on hand     Medication Adherence    Patient reported X missed doses in the last month: 0  Specialty Medication: Otrexup 25mg /0.42ml          Specialty medication(s) dose(s) confirmed: Regimen is correct and unchanged.     Are there any concerns with adherence? No    Adherence counseling provided? Not needed    CLINICAL MANAGEMENT AND INTERVENTION      Clinical Benefit Assessment:    Do you feel the medicine is effective or helping your condition? Yes    Clinical Benefit counseling provided? Not needed    Adverse Effects Assessment:    Are you experiencing any side effects? No    Are you experiencing difficulty administering your medicine? No    Quality of Life Assessment:    Rheumatology:   Quality of Life    On a scale of 1 ??? 10 with 1 representing not at all and 10 representing completely ??? how has your rheumatologic condition affected your:  Daily pain level?: decline to answer  Ability to complete your regular daily tasks (prepare meals, get dressed, etc.)?: decline to answer  Ability to participate in social or family activities?: decline to answer         Have you discussed this with your provider? Not needed    Therapy Appropriateness:    Is therapy appropriate? Yes, therapy is appropriate and should be continued    DISEASE/MEDICATION-SPECIFIC INFORMATION      For patients on injectable medications: Patient currently has 1 doses left.  Next injection is scheduled for 07/26/2020.    PATIENT SPECIFIC NEEDS     - Does the patient have any physical, cognitive, or cultural barriers? No    - Is the patient high risk? No    - Does the patient require a Care Management Plan? No     - Does the patient require physician intervention or other additional services (i.e. nutrition, smoking cessation, social work)? No      SHIPPING     Specialty Medication(s) to be Shipped:   Inflammatory Disorders: Otrexup 25mg /0.47ml    Other medication(s) to be shipped: No additional medications requested for fill at this time     Changes to insurance: No    Delivery Scheduled: Yes, Expected medication delivery date: 07/28/2020.     Medication will be delivered via UPS to the confirmed prescription address in [facility].    The patient will receive a drug information handout for each medication shipped and additional FDA Medication Guides as required.  Verified that patient has previously received a Conservation officer, historic buildings.    All of the patient's questions and concerns have been addressed.    Karene Fry Meghanne Pletz   [facility] Specialty Pharmacist

## 2020-07-27 ENCOUNTER — Other Ambulatory Visit: Payer: Self-pay | Admitting: Orthopedic Surgery

## 2020-07-27 DIAGNOSIS — M545 Low back pain, unspecified: Secondary | ICD-10-CM

## 2020-07-27 MED FILL — OTREXUP (PF) 25 MG/0.4 ML SUBCUTANEOUS AUTO-INJECTOR: SUBCUTANEOUS | 84 days supply | Qty: 4.8 | Fill #1

## 2020-07-31 ENCOUNTER — Other Ambulatory Visit: Payer: Self-pay

## 2020-08-04 MED ORDER — PEG 3350-ELECTROLYTES 236 GRAM-22.74 GRAM-6.74 GRAM-5.86 GRAM SOLUTION
Freq: Once | ORAL | 0 refills | 1 days | Status: CP
Start: 2020-08-04 — End: 2020-08-05

## 2020-08-08 MED FILL — PEG 3350-ELECTROLYTES 236 GRAM-22.74 GRAM-6.74 GRAM-5.86 GRAM SOLUTION: ORAL | 1 days supply | Qty: 4000 | Fill #0

## 2020-08-08 MED FILL — TRAMADOL 50 MG TABLET: ORAL | 17 days supply | Qty: 50 | Fill #0

## 2020-08-14 ENCOUNTER — Other Ambulatory Visit: Payer: Self-pay

## 2020-08-14 ENCOUNTER — Ambulatory Visit
Admission: RE | Admit: 2020-08-14 | Discharge: 2020-08-14 | Disposition: A | Discharge status: Home / Self Care | Payer: No Typology Code available for payment source | Source: Ambulatory Visit | Attending: Orthopedic Surgery | Admitting: Orthopedic Surgery

## 2020-08-14 DIAGNOSIS — M545 Low back pain, unspecified: Secondary | ICD-10-CM

## 2020-08-20 DIAGNOSIS — M0579 Rheumatoid arthritis with rheumatoid factor of multiple sites without organ or systems involvement: Principal | ICD-10-CM

## 2020-08-22 ENCOUNTER — Encounter: Admit: 2020-08-22 | Discharge: 2020-08-22 | Payer: PRIVATE HEALTH INSURANCE

## 2020-08-22 ENCOUNTER — Non-Acute Institutional Stay: Admit: 2020-08-22 | Discharge: 2020-08-22 | Payer: PRIVATE HEALTH INSURANCE

## 2020-08-22 DIAGNOSIS — I959 Hypotension, unspecified: Principal | ICD-10-CM

## 2020-08-22 DIAGNOSIS — K449 Diaphragmatic hernia without obstruction or gangrene: Principal | ICD-10-CM

## 2020-08-22 LAB — URINALYSIS WITH CULTURE REFLEX
BILIRUBIN UA: NEGATIVE
BLOOD UA: NEGATIVE
GLUCOSE UA: NEGATIVE
KETONES UA: NEGATIVE
LEUKOCYTE ESTERASE UA: NEGATIVE
NITRITE UA: POSITIVE — AB
PH UA: 6 (ref 5.0–9.0)
PROTEIN UA: NEGATIVE
RBC UA: <1 /HPF (ref 0–3)
SPECIFIC GRAVITY UA: >=1.03 (ref 1.005–1.030)
SQUAMOUS EPITHELIAL: 12 /HPF — ABNORMAL HIGH (ref 0–5)
UROBILINOGEN UA: 0.2
WBC UA: 5 /HPF — ABNORMAL HIGH (ref 0–3)

## 2020-08-22 LAB — CBC W/ AUTO DIFF
BASOPHILS ABSOLUTE COUNT: 0.1 10*9/L (ref 0.0–0.1)
BASOPHILS RELATIVE PERCENT: 2.3 %
EOSINOPHILS ABSOLUTE COUNT: 0.1 10*9/L (ref 0.0–0.5)
EOSINOPHILS RELATIVE PERCENT: 1.8 %
HEMATOCRIT: 31.6 % — ABNORMAL LOW (ref 34.0–44.0)
HEMOGLOBIN: 11 g/dL — ABNORMAL LOW (ref 11.3–14.9)
LYMPHOCYTES ABSOLUTE COUNT: 1.8 10*9/L (ref 1.1–3.6)
LYMPHOCYTES RELATIVE PERCENT: 30.1 %
MEAN CORPUSCULAR HEMOGLOBIN CONC: 34.7 g/dL (ref 32.0–36.0)
MEAN CORPUSCULAR HEMOGLOBIN: 33.7 pg — ABNORMAL HIGH (ref 25.9–32.4)
MEAN CORPUSCULAR VOLUME: 96.9 fL — ABNORMAL HIGH (ref 77.6–95.7)
MEAN PLATELET VOLUME: 7.6 fL (ref 6.8–10.7)
MONOCYTES ABSOLUTE COUNT: 0.3 10*9/L (ref 0.3–0.8)
MONOCYTES RELATIVE PERCENT: 5.2 %
NEUTROPHILS ABSOLUTE COUNT: 3.6 10*9/L (ref 1.8–7.8)
NEUTROPHILS RELATIVE PERCENT: 60.6 %
NUCLEATED RED BLOOD CELLS: 0 /100{WBCs} (ref <=4)
PLATELET COUNT: 327 10*9/L (ref 150–450)
RED BLOOD CELL COUNT: 3.26 10*12/L — ABNORMAL LOW (ref 3.95–5.13)
RED CELL DISTRIBUTION WIDTH: 20.1 % — ABNORMAL HIGH (ref 12.2–15.2)
WBC ADJUSTED: 5.9 10*9/L (ref 3.6–11.2)

## 2020-08-22 LAB — TOXICOLOGY SCREEN, URINE
AMPHETAMINE SCREEN URINE: NEGATIVE
BARBITURATE SCREEN URINE: NEGATIVE
BENZODIAZEPINE SCREEN, URINE: NEGATIVE
BUPRENORPHINE, URINE SCREEN: NEGATIVE
CANNABINOID SCREEN URINE: POSITIVE — AB
COCAINE(METAB.)SCREEN, URINE: NEGATIVE
FENTANYL SCREEN, URINE: NEGATIVE
METHADONE SCREEN, URINE: NEGATIVE
OPIATE SCREEN URINE: NEGATIVE
OXYCODONE SCREEN URINE: NEGATIVE

## 2020-08-22 LAB — SLIDE REVIEW

## 2020-08-22 MED ORDER — SUCRALFATE 100 MG/ML ORAL SUSPENSION
Freq: Four times a day (QID) | ORAL | 11 refills | 11 days | Status: CP
Start: 2020-08-22 — End: 2021-08-22

## 2020-08-22 MED ORDER — HYDROXYZINE HCL 10 MG TABLET
ORAL_TABLET | Freq: Three times a day (TID) | ORAL | 1 refills | 30 days | Status: CP | PRN
Start: 2020-08-22 — End: ?
  Filled 2020-08-22: qty 70, 24d supply, fill #0

## 2020-08-22 MED ORDER — TRAMADOL 50 MG TABLET
ORAL_TABLET | Freq: Three times a day (TID) | ORAL | 2 refills | 17.00000 days | Status: CN | PRN
Start: 2020-08-22 — End: ?

## 2020-08-22 MED ORDER — METHOCARBAMOL 500 MG TABLET
ORAL_TABLET | Freq: Three times a day (TID) | ORAL | 1 refills | 30 days | Status: CP
Start: 2020-08-22 — End: ?
  Filled 2020-08-22: qty 90, 30d supply, fill #0

## 2020-08-22 NOTE — Unmapped (Addendum)
Good to see you today  Blood pressure too low could be dehydration  Will check urine for infection  Will check blood work as well  Pick up hydroxyzine to try for anxiety.  May make you sleepy as well  Consider spinal surgery as pain affecting your quality of life  Referral to GI surgeon to review your hernia

## 2020-08-22 NOTE — Unmapped (Signed)
[facility]       Type of visit:  face to face    Reason for visit: follow up    Questions / Concerns that need to be addressed: hip and leg pain bilaterl    General Consent to Treat (GCT) for non-epic video visits only: Verbal consent      Diabetes:  ??? Regularly checking blood sugars?: no  o If yes, when? Complete log for past 7 days  Date Before Breakfast After Breakfast Before Lunch After Lunch Before Dinner After Dinner Before Bed                                                                                                                                     Hypertension:  ??? Have blood pressure cuff at home?: no  ??? Regularly checking blood pressure?: no  If yes, complete log for past 7 days  Date Time BP Pulse                                                                             Screening BP- 73/45 p 49        Omron BPs (complete if screening BP has a systolic  > 139 or diastolic > 89)  BP#1 73/45 p 49  BP#2 90/42 p76  BP#3 error out twice            Allergies reviewed: Yes    Medication reviewed: Yes  Pended refills? Yes        HCDM reviewed and updated in Epic:    We are working to make sure all of our patients??? wishes are updated in Epic and part of that is documenting a Environmental health practitioner for each patient  A Health Care Decision Rodena Piety is someone you choose who can make health care decisions for you if you are not able ??? who would you most want to do this for you????  is already up to date.        BPAs completed  phq 2  phq 9  Gad       COVID-19 Vaccine Summary  Which COVID-19 Vaccine was administered  Pfizer  Type:  Dates Given:  04/07/2020                       Immunization History   Administered Date(s) Administered   ??? COVID-19 VACC,MRNA,(PFIZER)(PF)(IM) 08/06/2019, 08/27/2019   ??? COVID-19 VACCINE,MRNA(MODERNA)(PF)(IM) 06/29/2019, 07/27/2019, 04/07/2020   ??? Hepatitis B, Adult 01/03/2015, 03/13/2015   ??? Influenza Vaccine Quad (IIV4 PF) 55mo+ injectable 02/10/2013, 05/03/2014, 03/13/2015, 03/20/2016, 07/12/2017, 03/11/2018, 02/14/2020   ???  Influenza Virus Vaccine, unspecified formulation 02/10/2013, 05/03/2014, 03/13/2015, 03/20/2016, 07/12/2017, 03/11/2018   ??? Influenza, High Dose (IIV4) 65 yrs & older 03/03/2019   ??? PNEUMOCOCCAL POLYSACCHARIDE 23 01/11/2015   ??? Pneumococcal conjugate -PCV7 02/18/2012   ??? TdaP 01/01/2011       __________________________________________________________________________________________    SCREENINGS COMPLETED IN FLOWSHEETS    HARK Screening       AUDIT       PHQ2  PHQ-2 Total Score : 5    PHQ9  Thoughts that you would be better off dead, or of hurting yourself in some way: Not at all  PHQ-9 TOTAL SCORE: 16    P4 Suicidality Screener                GAD7    Over the last 2 weeks, how often have you been bothered by the following problems?  Feeling nervous, anxious or on edge: Not at all  Not being able to stop or control worrying: Not at all  Worrying too much about different things: Not at all  Trouble relaxing: Nearly every day  Being so restless that it is hard to sit still: Nearly every day  Becoming easily annoyed or irritable: Nearly every day  Feeling afraid as if something awful might happen: Not at all  GAD-7 Total Score: 9  How difficult have these problems made it for you to do your work, take care of things at home, or get along with other people?: Somewhat difficult    COPD Assessment       Falls Risk       .imcres

## 2020-08-22 NOTE — Unmapped (Unsigned)
Internal Medicine Clinic    Return Visit    Face to Face    Assessment and Plan:    1. Hypotension, unspecified hypotension type    2. Hiatal hernia    3. Acute cystitis without hematuria    4. Encounter for screening for malignant neoplasm of breast, unspecified screening modality    5. Anxiety    6. Spinal stenosis at L4-L5 level    7. Gastroesophageal reflux disease with esophagitis without hemorrhage      HCM:  Mammography ordered.  Colonoscopy scheduled  Hypotension:  Improved somewhat on recheck.  Possible dehydration vs medication, infectious etiology.  Checking urinalysis, culture, CBC  Depression/sleep difficulty/anxiety:  Continue mirtazapine to 30 mg nightly.  Add hydroxyzine prn.  She will pick up today.    RA:  Followed by rheumatology.    Back pain/spinal stenosis with regional neurogenic claudication:  Follows with spine.  Considering surgery.  Encouraged to consider given the great effect of pain on her quality of life.  Continue methocarbamol, cyclobenzaprine.  Hold on tramadol, urine last time had amphetamines and continued cannabinoids.   Gastritis/ab pain/GERD:  Cont protonix, bentyl, sucralfate.  Had EGD 2021.  Does have hiatal hernia, worsened with her CT chest imagine.  Agrees to consult with GI surgery, referred.  Weight loss:  Has gained weight, up 4 lb since Feb visit.  She is scheduled for colonoscopy and will keep.  utd on lung cancer screening, repeated chest CT last fall.  SPEP unrevealing.  Needs mammography, ordered    I spent a total of 40 min with the patient including pre intra and post visit activities    Medication adherence and barriers to the treatment plan have been addressed. Opportunities to optimize healthy behaviors have been discussed. Patient / caregiver voiced understanding.    Addendum:  Called review labs urinalysis +nitrite, bacteria, WBC, likely UTI, culture pending.  Will go ahead and start cefdinir and await culture results.  Needs mammogram she says she is willing to schedule, ordered  _____________________________________________________________________    Chief Complaint:  Follow up    Patient Active Problem List    Diagnosis Date Noted   ??? Slow transit constipation 09/18/2019   ??? Generalized abdominal pain 08/28/2019   ??? 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 Paraesophageal hernia 05/08/2019   ??? Severe protein-calorie malnutrition (CMS-HCC) 05/08/2019   ??? AKI (acute kidney injury) (CMS-HCC) 02/05/2019   ??? PAIN MEDICATION SIGNED WITH [facility] 12/01/2018   ??? Urinary tract infection 11/12/2018   ??? Spinal stenosis of lumbar region with neurogenic claudication 09/18/2018   ??? Spondylolisthesis of lumbar region 09/18/2018   ??? Nausea and vomiting 05/25/2018   ??? Piriformis syndrome of both sides 03/11/2018   ??? Intractable nausea and vomiting 10/23/2017   ??? Stage 1 mild COPD by GOLD classification (CMS-HCC) 02/25/2017   ??? Rheumatoid arthritis involving both feet (CMS-HCC)    ??? Pain of toe of left foot 01/27/2016   ??? Immunosuppressed status (CMS-HCC) 01/27/2016   ??? Osteomyelitis (CMS-HCC) 01/26/2016   ??? Atrial flutter (CMS-HCC) 12/25/2015   ??? Rheumatoid arthritis (CMS-HCC) 01/11/2015   ??? Anxiety 02/10/2013   ??? Depression 02/10/2013   ??? Functional Abdominal pain 08/29/2012   ??? Gastritis 08/26/2012   ??? GERD (gastroesophageal reflux disease) 08/24/2012   ??? Tobacco use disorder 01/01/2011       HPI:  Bethany Meyer is a very pleasant 65 yo female with PMH RA, COPD, spinal stenosis with neurogenic claudication, atrial flutter, depression/anxiety, gastritis/GERD  and tobacco abuse here for follow up.  Saw spine for second opinion.  Is really thinking about surgery.  GI issues continue.  Hard to eat solid foods, she feels discomfort when she does and wraps to back.  Or feels very nauseated and vomits.  Some better, she hasn't vomited in a year.  Trying to keep up with supplements like Ensure and boost.  Weight up 4 lb.  Pain control very difficult still.      Current Outpatient Medications on File Prior to Visit   Medication Sig   ??? acetaminophen (TYLENOL) 325 MG tablet Take 325 mg by mouth two (2) times a day as needed for pain.   ??? arm brace Misc Boxer splint, metacarpal wrist brace   ??? aspirin 81 MG chewable tablet CHEW 1 TABLET BY MOUTH DAILY   ??? buPROPion (WELLBUTRIN XL) 150 MG 24 hr tablet Take 1 tablet (150 mg total) by mouth every morning.   ??? cholecalciferol, vitamin D3 25 mcg, 1,000 units,, (CHOLECALCIFEROL-25 MCG, 1,000 UNIT,) 1,000 unit (25 mcg) tablet Take 1 tablet (25 mcg total) by mouth daily.   ??? cyclobenzaprine (FLEXERIL) 5 MG tablet Take 5-10 mg by mouth nightly as needed.   ??? dicyclomine (BENTYL) 20 mg tablet Take 1 tablet (20 mg total) by mouth four times a day as needed (stomach cramps).   ??? empty container Misc Use as directed   ??? estradioL (ESTRACE) 0.01 % (0.1 mg/gram) vaginal cream Insert 2 g into the vagina Two (2) times a week. Insert 2 g inside vagina and apply a pea-sized amount around urethra twice weekly   ??? folic acid (FOLVITE) 1 MG tablet Take 1 tablet (1 mg total) by mouth daily.   ??? gabapentin (NEURONTIN) 300 MG capsule Take 3 capsules (900 mg total) by mouth Three times a day.   ??? haloperidoL (HALDOL) 0.5 MG tablet Take 1 tablet (0.5 mg total) by mouth every morning before breakfast.   ??? hydrOXYzine (ATARAX) 10 MG tablet Take 1 tablet (10 mg total) by mouth Three times a day as needed for itching.   ??? leg brace (KNEE SUPPORT BRACE) Misc 1 each by Miscellaneous route daily as needed.   ??? methocarbamoL (ROBAXIN) 500 MG tablet Take 1 tablet (500 mg total) by mouth Three times a day.   ??? methotrexate, PF, (OTREXUP, PF,) 25 mg/0.4 mL [name] Inject the contents of 1 pen (25mg ) under the skin every 7 days   ??? mirtazapine (REMERON) 15 MG tablet Take 2 tablets (30 mg total) by mouth nightly. (Patient not taking: Reported on 06/19/2020)   ??? needle, disp, 25 gauge 25 gauge x 5/8 Ndle For methotrexate injections   ??? nicotine (NICODERM CQ) 14 mg/24 hr patch Place 1 patch on the skin daily.   ??? pantoprazole (PROTONIX) 40 MG tablet Take 1 tablet (40 mg total) by mouth daily.   ??? [EXPIRED] polyethylene glycol (GOLYTELY) 236-22.74-6.74 gram solution Take as directed per [facility] prep instructions.   ??? sucralfate (CARAFATE) 100 mg/mL suspension Take 10 mL (1 g total) by mouth Four times a day (before meals and nightly).   ??? traMADoL (ULTRAM) 50 mg tablet Take 1 tablet (50 mg total) by mouth every eight hours as needed for pain.   ??? VITAMIN B-1 100 mg tablet Take 1 tablet (100 mg total) by mouth daily. (Patient not taking: Reported on 07/11/2020)     No current facility-administered medications on file prior to visit.        Review of Systems -  hpi    Wt Readings from Last 3 Encounters:   08/22/20 52.4 kg (115 lb 9.6 oz)   07/11/20 50.6 kg (111 lb 9.6 oz)   06/19/20 55.6 kg (122 lb 9.6 oz)     Temp Readings from Last 3 Encounters:   08/22/20 36.5 ??C (97.7 ??F) (Temporal)   07/11/20 36.8 ??C (98.2 ??F)   06/19/20 36.2 ??C (97.2 ??F) (Temporal)     BP Readings from Last 3 Encounters:   08/22/20 73/45   07/11/20 117/70   06/19/20 138/96     Pulse Readings from Last 3 Encounters:   08/22/20 (!) 49   07/11/20 82   06/19/20 76       Exam:  BP 73/45 Comment: 1 of two BP - Pulse (!) 49  - Temp 36.5 ??C (97.7 ??F) (Temporal)  - Ht 168.9 cm (5' 6.5)  - Wt 52.4 kg (115 lb 9.6 oz)  - LMP 05/20/2005  - SpO2 98%  - BMI 18.38 kg/m??    Vitals:    08/22/20 0916 08/22/20 0954   BP: 73/45 88/55   Pulse: (!) 49 78   Temp: 36.5 ??C (97.7 ??F)    TempSrc: Temporal    SpO2: 98%    Weight: 52.4 kg (115 lb 9.6 oz)    Height: 168.9 cm (5' 6.5)        Gen:  Chronically ill appearing in NAD  CV RRR no mrg   Lungs CTA bl  Ab soft, mild ttp throughout.  No mass, organomegaly or rebounding    Laural Benes, PA-C  Supervising physician Gabrielle Dare, MD Guinevere Ferrari, MD

## 2020-08-23 MED ORDER — CEFDINIR 300 MG CAPSULE
ORAL_CAPSULE | Freq: Two times a day (BID) | ORAL | 0 refills | 7 days | Status: CP
Start: 2020-08-23 — End: ?

## 2020-08-26 ENCOUNTER — Encounter
Admit: 2020-08-26 | Discharge: 2020-08-26 | Disposition: A | Discharge status: Home / Self Care | Payer: PRIVATE HEALTH INSURANCE

## 2020-08-26 DIAGNOSIS — R109 Unspecified abdominal pain: Principal | ICD-10-CM

## 2020-08-26 LAB — CBC W/ AUTO DIFF
BASOPHILS ABSOLUTE COUNT: 0 10*9/L (ref 0.0–0.2)
BASOPHILS RELATIVE PERCENT: 0.5 %
EOSINOPHILS ABSOLUTE COUNT: 0 10*9/L (ref 0.0–0.4)
EOSINOPHILS RELATIVE PERCENT: 0.5 %
HEMATOCRIT: 35.1 % (ref 34.0–46.6)
HEMOGLOBIN: 11.7 g/dL (ref 11.1–15.9)
LYMPHOCYTES ABSOLUTE COUNT: 1.9 10*9/L (ref 0.7–3.1)
LYMPHOCYTES RELATIVE PERCENT: 25.9 %
MEAN CORPUSCULAR HEMOGLOBIN CONC: 33.3 g/dL (ref 31.5–35.7)
MEAN CORPUSCULAR HEMOGLOBIN: 31.6 pg (ref 27.0–33.0)
MEAN CORPUSCULAR VOLUME: 94.9 fL (ref 79.0–97.0)
MEAN PLATELET VOLUME: 8.5 fL — ABNORMAL LOW (ref 9.0–12.0)
MONOCYTES ABSOLUTE COUNT: 0.7 10*9/L (ref 0.1–0.9)
MONOCYTES RELATIVE PERCENT: 10 %
NEUTROPHILS ABSOLUTE COUNT: 4.7 10*9/L (ref 1.4–7.0)
NEUTROPHILS RELATIVE PERCENT: 63.1 %
PLATELET COUNT: 377 10*9/L (ref 155–379)
RED BLOOD CELL COUNT: 3.7 10*12/L — ABNORMAL LOW (ref 3.77–5.28)
RED CELL DISTRIBUTION WIDTH: 18.3 % — ABNORMAL HIGH (ref 12.3–15.4)
WBC ADJUSTED: 7.4 10*9/L (ref 3.4–10.8)

## 2020-08-26 LAB — COMPREHENSIVE METABOLIC PANEL
ALBUMIN: 4 g/dL (ref 3.4–5.0)
ALKALINE PHOSPHATASE: 72 U/L (ref 38–126)
ALT (SGPT): 17 U/L (ref <35)
ANION GAP: <1 mmol/L — ABNORMAL LOW (ref 7–15)
AST (SGOT): 35 U/L (ref 14–38)
BILIRUBIN TOTAL: 0.5 mg/dL (ref 0.1–1.2)
BLOOD UREA NITROGEN: 22 mg/dL — ABNORMAL HIGH (ref 7–21)
BUN / CREAT RATIO: 31
CALCIUM: 9.7 mg/dL (ref 8.5–10.2)
CHLORIDE: 114 mmol/L — ABNORMAL HIGH (ref 98–107)
CO2: 24 mmol/L (ref 22.0–32.0)
CREATININE: 0.7 mg/dL (ref 0.60–1.00)
EGFR CKD-EPI AA FEMALE: >90 mL/min/{1.73_m2} (ref >=60)
EGFR CKD-EPI NON-AA FEMALE: >90 mL/min/{1.73_m2} (ref >=60)
GLUCOSE RANDOM: 99 mg/dL (ref 74–106)
POTASSIUM: 3.9 mmol/L (ref 3.5–5.0)
PROTEIN TOTAL: 8 g/dL (ref 6.5–8.3)
SODIUM: 138 mmol/L (ref 135–145)

## 2020-08-26 LAB — URINALYSIS
BILIRUBIN UA: NEGATIVE
BLOOD UA: NEGATIVE
GLUCOSE UA: NEGATIVE
LEUKOCYTE ESTERASE UA: NEGATIVE
NITRITE UA: NEGATIVE
PH UA: 5.5 (ref 5.0–7.0)
PROTEIN UA: NEGATIVE
RBC UA: 0 /HPF (ref 0–3)
SPECIFIC GRAVITY UA: >=1.03 (ref 1.005–1.030)
SQUAMOUS EPITHELIAL: 2 /HPF (ref 0–5)
UROBILINOGEN UA: 0.2
WBC UA: 1 /HPF (ref 0–3)

## 2020-08-26 MED ORDER — TRAMADOL 50 MG TABLET
ORAL_TABLET | Freq: Four times a day (QID) | ORAL | 0 refills | 3 days | Status: CP | PRN
Start: 2020-08-26 — End: ?

## 2020-08-26 MED ADMIN — iohexoL (OMNIPAQUE) 300 mg iodine/mL solution 75 mL: 75 mL | INTRAVENOUS | @ 22:00:00 | Stop: 2020-08-26

## 2020-08-26 MED ADMIN — MORPhine 4 mg/mL injection 4 mg: 4 mg | INTRAVENOUS | @ 21:00:00 | Stop: 2020-08-26

## 2020-08-26 MED ADMIN — ondansetron (ZOFRAN) injection 4 mg: 4 mg | INTRAVENOUS | @ 21:00:00 | Stop: 2020-08-26

## 2020-08-26 NOTE — Unmapped (Signed)
[facility] EMERGENCY DEPARTMENT ENCOUNTER      CLINICAL IMPRESSION  Final diagnoses:   Flank pain (Primary)         ASSESSMENT & PLAN    Bethany Meyer is a 65 y.o. female with past medical history of  rheumatoid arthritis, COPD, spinal stenosis with neurogenic claudication, atrial flutter, depression/anxiety, gastritis/GERD, and tobacco abuse presenting for a few days of severe, sharp, stabbing right flank pain and an inability to tolerate po intake due to pain. Differential diagnosis includes but is not limited to kidney stone, UTI, pyelonephritis, appendicitis. On physical exam patient's heart is regular rate and rhythm without any murmur. Lungs are clear. Tenderness to right lateral abdomen with voluntary guarding. Normal bowel sounds. No palpable masses.     Patient received IV Morphine and Zofran.  CMP, UA and CBC and CT abdomen were negative.  Patient later stated that she does not have any tramadol at home.  PDMP report shows that she receives tramadol 50 mg on a monthly basis.  I did give her a prescription for tramadol 50 mg 10 tablets and instructed her to follow-up with her PCP this week.      ADDITIONAL MEDICAL DECISION MAKING  I reviewed the patient's prior medical records (Office Visit 08/22/20).     _______________________________________________________________    Time seen: August 26, 2020 4:33 PM    I have reviewed the triage vital signs and the nursing notes.    CHIEF COMPLAINT    Chief Complaint   Patient presents with   ??? Flank Pain       HPI   Bethany Meyer is a 65 y.o. female with PMH of rheumatoid arthritis, COPD, spinal stenosis with neurogenic claudication, atrial flutter, depression/anxiety, gastritis/GERD, and tobacco abuse presenting today for flank pain. Patient reports 1.5 weeks of dysuria and dark urine. She was seen by her PCP 4 days ago and prescribed Cefdinir for treatment of a UTI.     Upon arrival to the ED, she now endorses severe, sharp, stabbing right flank pain despite taking the antibiotic. Patient states it feels like someone cracked my ribs. She vomited once last night. She also reports decreased PO intake secondary to pain. Sitting up makes the pain better. Movement makes the pain worse. Her stools have been dark. She is no longer experiencing urinary symptoms since starting the antibiotic. No history of abdominal surgeries. No chest pain or shortness of breath.     PAST MEDICAL HISTORY    Past Medical History:   Diagnosis Date   ??? Arthritis     RA   ??? Cataract    ??? Functional dyspepsia    ??? GERD (gastroesophageal reflux disease)    ??? Gout    ??? RA (rheumatoid arthritis) (CMS-HCC)    ??? Smoking    ??? Stage 1 mild COPD by GOLD classification (CMS-HCC) 02/25/2017       SURGICAL HISTORY    Past Surgical History:   Procedure Laterality Date   ??? ovarian cyst removed     ??? OVARY SURGERY     ??? PR COLONOSCOPY FLX DX W/COLLJ SPEC WHEN PFRMD N/A 03/05/2013    Procedure: COLONOSCOPY, FLEXIBLE, PROXIMAL TO SPLENIC FLEXURE; DIAGNOSTIC, W/WO COLLECTION SPECIMEN BY BRUSH OR WASH;  Surgeon: Trudie Buckler, MD;  Location: GI PROCEDURES [facility];  Service: Gastroenterology   ??? PR COMPRE EP EVAL ABLTJ 3D MAPG TX SVT N/A 12/26/2015    Procedure: A-Flutter Ablation;  Surgeon: Theresia Majors, MD;  Location: [facility] EP;  Service: Cardiology   ??? PR UP GI ENDOSCOPY,BALL DIL,30MM N/A 06/03/2019    Procedure: UGI ENDO; W/BALLOON DILAT ESOPHAGUS (<30MM DIAM);  Surgeon: Neysa Hotter, MD;  Location: [facility]ED[facility];  Service: Gastroenterology   ??? PR UPPER GI ENDOSCOPY,BIOPSY N/A 09/16/2012    Procedure: UGI ENDOSCOPY; WITH BIOPSY, SINGLE OR MULTIPLE;  Surgeon: Brown Human, MD;  Location: [facility];  Service: Gastroenterology   ??? PR UPPER GI ENDOSCOPY,BIOPSY N/A 06/03/2019    Procedure: UGI ENDOSCOPY; WITH BIOPSY, SINGLE OR MULTIPLE;  Surgeon: Neysa Hotter, MD;  Location: [facility] PROCED[facility];  Service: Gastroenterology   ??? PR UPPER GI ENDOSCOPY,DIAGNOSIS N/A 06/03/2019    Procedure: UGI ENDO, INCLUDE ESOPHAGUS, STOMACH, & DUODENUM &/OR JEJUNUM; DX W/WO COLLECTION SPECIMN, BY BRUSH OR WASH;  Surgeon: Neysa Hotter, MD;  Location: [facility] PROCED[facility];  Service: Gastroenterology       CURRENT MEDICATIONS    No current facility-administered medications for this encounter.    Current Outpatient Medications:   ???  acetaminophen (TYLENOL) 325 MG tablet, Take 325 mg by mouth two (2) times a day as needed for pain., Disp: , Rfl:   ???  aspirin 81 MG chewable tablet, CHEW 1 TABLET BY MOUTH DAILY, Disp: 36 tablet, Rfl: 3  ???  buPROPion (WELLBUTRIN XL) 150 MG 24 hr tablet, Take 1 tablet (150 mg total) by mouth every morning., Disp: 90 tablet, Rfl: 3  ???  cefdinir (OMNICEF) 300 MG capsule, Take 1 capsule (300 mg total) by mouth every twelve (12) hours., Disp: 14 capsule, Rfl: 0  ???  cholecalciferol, vitamin D3 25 mcg, 1,000 units,, (CHOLECALCIFEROL-25 MCG, 1,000 UNIT,) 1,000 unit (25 mcg) tablet, Take 1 tablet (25 mcg total) by mouth daily., Disp: 30 tablet, Rfl: 11  ???  dicyclomine (BENTYL) 20 mg tablet, Take 1 tablet (20 mg total) by mouth four times a day as needed (stomach cramps)., Disp: 360 tablet, Rfl: 3  ???  estradioL (ESTRACE) 0.01 % (0.1 mg/gram) vaginal cream, Insert 2 g into the vagina Two (2) times a week. Insert 2 g inside vagina and apply a pea-sized amount around urethra twice weekly, Disp: , Rfl: 0  ???  folic acid (FOLVITE) 1 MG tablet, Take 1 tablet (1 mg total) by mouth daily., Disp: 100 tablet, Rfl: 3  ???  gabapentin (NEURONTIN) 300 MG capsule, Take 3 capsules (900 mg total) by mouth Three times a day., Disp: 270 capsule, Rfl: 3  ???  haloperidoL (HALDOL) 0.5 MG tablet, Take 1 tablet (0.5 mg total) by mouth every morning before breakfast., Disp: 90 tablet, Rfl: 3  ???  hydrOXYzine (ATARAX) 10 MG tablet, Take 1 tablet (10 mg total) by mouth Three times a day as needed for itching., Disp: 90 tablet, Rfl: 1  ???  methocarbamoL (ROBAXIN) 500 MG tablet, Take 1 tablet (500 mg total) by mouth Three times a day., Disp: 90 tablet, Rfl: 1  ???  nicotine (NICODERM CQ) 14 mg/24 hr patch, Place 1 patch on the skin daily., Disp: 28 patch, Rfl: 1  ???  sucralfate (CARAFATE) 100 mg/mL suspension, Take 10 mL (1 g total) by mouth Four times a day (before meals and nightly)., Disp: 420 mL, Rfl: 11  ???  VITAMIN B-1 100 mg tablet, Take 1 tablet (100 mg total) by mouth daily., Disp: 100 tablet, Rfl: 3  ???  arm brace Misc, Boxer splint, metacarpal wrist brace, Disp: 1 each, Rfl: 0  ???  cyclobenzaprine (FLEXERIL)  5 MG tablet, Take 5-10 mg by mouth nightly as needed. (Patient not taking: Reported on 08/26/2020), Disp: , Rfl:   ???  empty container Misc, Use as directed, Disp: 1 each, Rfl: 2  ???  leg brace (KNEE SUPPORT BRACE) Misc, 1 each by Miscellaneous route daily as needed., Disp: 1 each, Rfl: 0  ???  methotrexate, PF, (OTREXUP, PF,) 25 mg/0.4 mL [name], Inject the contents of 1 pen (25mg ) under the skin every 7 days, Disp: 4.8 mL, Rfl: 1  ???  mirtazapine (REMERON) 15 MG tablet, Take 2 tablets (30 mg total) by mouth nightly., Disp: 180 tablet, Rfl: 1  ???  needle, disp, 25 gauge 25 gauge x 5/8 Ndle, For methotrexate injections, Disp: 100 each, Rfl: 0  ???  pantoprazole (PROTONIX) 40 MG tablet, Take 1 tablet (40 mg total) by mouth daily. (Patient not taking: Reported on 08/26/2020), Disp: 90 tablet, Rfl: 3  ???  traMADoL (ULTRAM) 50 mg tablet, Take 1 tablet (50 mg total) by mouth every six (6) hours as needed for pain for up to 10 doses., Disp: 10 tablet, Rfl: 0    ALLERGIES    No Known Allergies    FAMILY HISTORY    Family History   Problem Relation Age of Onset   ??? Diabetes Mother         type 2   ??? Breast cancer Mother    ??? Diabetes Father         type 2   ??? Cataracts Father    ??? Macular degeneration Neg Hx    ??? Retinal detachment Neg Hx    ??? Colon cancer Neg Hx    ??? Endometrial cancer Neg Hx    ??? Ovarian cancer Neg Hx    ??? Glaucoma Neg Hx    ??? Blindness Neg Hx        SOCIAL HISTORY  Social History     Socioeconomic History   ??? Marital status: Single     Spouse name: None   ??? Number of children: None   ??? Years of education: None   ??? Highest education level: None   Occupational History   ??? None   Tobacco Use   ??? Smoking status: Current Every Day Smoker     Packs/day: 1.00     Years: 35.00     Pack years: 35.00     Types: Cigarettes   ??? Smokeless tobacco: Never Used   ??? Tobacco comment: Started smoking at 33,    Vaping Use   ??? Vaping Use: Some days   Substance and Sexual Activity   ??? Alcohol use: No     Alcohol/week: 0.0 standard drinks   ??? Drug use: Yes     Types: Marijuana     Comment: once a month   ??? Sexual activity: Never   Other Topics Concern   ??? Exercise No   ??? Living Situation Yes   Social History Narrative   ??? None     Social Determinants of Health     Financial Resource Strain: Low Risk    ??? Difficulty of Paying Living Expenses: Not very hard   Food Insecurity: No Food Insecurity   ??? Worried About Running Out of Food in the Last Year: Never true   ??? Ran Out of Food in the Last Year: Never true   Transportation Needs: No Transportation Needs   ??? Lack of Transportation (Medical): No   ??? Lack of Transportation (Non-Medical): No   Physical  Activity: Not on file   Stress: Not on file   Social Connections: Not on file       REVIEW OF SYSTEMS  Constitutional: Negative for fever.  HENT: Negative for sore throat.  Eyes: Negative for visual changes.  Cardiovascular: Negative for chest pain.  Respiratory: Negative for shortness of breath.  Gastrointestinal: Negative for abdominal pain, vomiting or diarrhea.  Genitourinary: Negative for dysuria. Positive for right flank pain.   Musculoskeletal: Negative for back pain.  Skin: Negative for rash.  Neurological: Negative for headaches, weakness or numbness.    10 system ROS negative except as marked above and in HPI.    PHYSICAL EXAM      VITAL SIGNS:    Vitals:    08/26/20 1429 08/26/20 1856   BP: 134/86 134/87   Pulse: 51 98   Resp: 16 19   Temp: 36.9 ??C (98.5 ??F) 37.3 ??C (99.1 ??F) TempSrc: Temporal    SpO2: 100% 97%   Weight: 51.7 kg (114 lb)    Height: 167.6 cm (5' 6)          Constitutional: Alert and oriented.  Respirations were not labored.  She does appear in mild distress secondary to her right flank pain.  HEENT       Head: Normocephalic and atraumatic.       Eyes: EOMI. No scleral icterus. PERRL.       Ears: Normal external ears and auditory canals bilaterally. Normal TM bilat.       Nose: No discharge. Normal turbinates.       Mouth/Throat: Normal voice. Mucous membranes are moist. Oropharynx normal. No peritonsilar mass or swelling.    Neck: No cervical lymphadenopathy. Neck supple.  Cardiovascular: Regular rate and rhythm. No murmur. Good distal peripheral pulses. No LE edema.  Pulmonary/Chest: Normal respiratory effort. Lungs CTAB with no rales, rhonchi, or wheezes. Good air movement.  Gastrointestinal: + BS.  Tenderness to right lateral abdomen with voluntary guarding. No palpable masses.   Musculoskeletal: Nontender, good ROM.  Skin: Skin is warm, dry and intact. No rash noted.  Neurological: Normal speech and language. No gross focal neurologic deficits. Motor 5/5. Sensation intact. CN II-XII grossly intact. No cerebellar signs.   Psychiatric: Normal mood and affect. Speech and behavior are normal.    EKG       RADIOLOGY    CT Abdomen Pelvis W Contrast    Result Date: 08/26/2020  CLINICAL DATA:  Right lower quadrant abdominal pain. EXAM: CT ABDOMEN AND PELVIS WITH CONTRAST TECHNIQUE: Multidetector CT imaging of the abdomen and pelvis was performed using the standard protocol following bolus administration of intravenous contrast. CONTRAST:  75 cc Omnipaque 300 intravenously. COMPARISON:  August 20, 2005 FINDINGS: Lower chest: No acute abnormality.  Large hiatal hernia. Hepatobiliary: No focal liver abnormality is seen. No gallstones, gallbladder wall thickening, or biliary dilatation. Pancreas: Unremarkable. No pancreatic ductal dilatation or surrounding inflammatory changes. Spleen: Normal in size without focal abnormality. Adrenals/Urinary Tract: Adrenal glands are unremarkable. Kidneys are normal, without renal calculi, focal lesion, or hydronephrosis. Bladder is unremarkable. Stomach/Bowel: Stomach is within normal limits. The appendix is not identified. There are no secondary signs of appendicitis. No evidence of bowel wall thickening, distention, or inflammatory changes. Vascular/Lymphatic: Aortic atherosclerosis. No enlarged abdominal or pelvic lymph nodes. Reproductive: Status post hysterectomy. No adnexal masses. Other: No abdominal wall hernia or abnormality. No abdominopelvic ascites. Musculoskeletal: 9 mm anterolisthesis of L4 on L5.     1. No evidence of acute abnormalities within  the abdomen or pelvis. 2. Large hiatal hernia. 3. 9 mm anterolisthesis of L4 on L5. 4. Aortic atherosclerosis. Aortic Atherosclerosis (ICD10-I70.0). Electronically Signed   By: Ted Mcalpine M.D.   On: 08/26/2020 18:31       LABS  Results for orders placed or performed during the hospital encounter of 08/26/20   Urinalysis   Result Value Ref Range    Color, UA Yellow     Clarity, UA Clear     Specific Gravity, UA >=1.030 1.005 - 1.030    pH, UA 5.5 5.0 - 7.0    Leukocyte Esterase, UA Negative Negative    Nitrite, UA Negative Negative    Protein, UA Negative Negative    Glucose, UA Negative Negative    Ketones, UA Trace (A) Negative    Urobilinogen, UA 0.2 mg/dL     Bilirubin, UA Negative Negative    Blood, UA Negative Negative    RBC, UA 0 0 - 3 /HPF    WBC, UA 1 0 - 3 /HPF    Squam Epithel, UA 2 0 - 5 /HPF    Bacteria, UA Rare (A) None Seen /HPF   Comprehensive Metabolic Panel   Result Value Ref Range    Sodium 138 135 - 145 mmol/L    Potassium 3.9 3.5 - 5.0 mmol/L    Chloride 114 (H) 98 - 107 mmol/L    Anion Gap <1 (L) 7 - 15 mmol/L    CO2 24.0 22.0 - 32.0 mmol/L    BUN 22 (H) 7 - 21 mg/dL    Creatinine 6.57 8.46 - 1.00 mg/dL    BUN/Creatinine Ratio 31     EGFR CKD-EPI Non-African American, Female >90 >=60 mL/min/1.81m2    EGFR CKD-EPI African American, Female >90 >=60 mL/min/1.64m2    Glucose 99 74 - 106 mg/dL    Calcium 9.7 8.5 - 96.2 mg/dL    Albumin 4.0 3.4 - 5.0 g/dL    Total Protein 8.0 6.5 - 8.3 g/dL    Total Bilirubin 0.5 0.1 - 1.2 mg/dL    AST 35 14 - 38 U/L    ALT 17 <35 U/L    Alkaline Phosphatase 72 38 - 126 U/L   CBC w/ Differential   Result Value Ref Range    WBC 7.4 3.4 - 10.8 10*9/L    RBC 3.70 (L) 3.77 - 5.28 10*12/L    HGB 11.7 11.1 - 15.9 g/dL    HCT 95.2 84.1 - 32.4 %    MCV 94.9 79.0 - 97.0 fL    MCH 31.6 27.0 - 33.0 pg    MCHC 33.3 31.5 - 35.7 g/dL    RDW 40.1 (H) 02.7 - 15.4 %    MPV 8.5 (L) 9.0 - 12.0 fL    Platelet 377 155 - 379 10*9/L    Neutrophils % 63.1 %    Lymphocytes % 25.9 %    Monocytes % 10.0 %    Eosinophils % 0.5 %    Basophils % 0.5 %    Absolute Neutrophils 4.7 1.4 - 7.0 10*9/L    Absolute Lymphocytes 1.9 0.7 - 3.1 10*9/L    Absolute Monocytes 0.7 0.1 - 0.9 10*9/L    Absolute Eosinophils 0.0 0.0 - 0.4 10*9/L    Absolute Basophils 0.0 0.0 - 0.2 10*9/L       Pertinent labs & imaging results that were available during my care of the patient were reviewed by me (see chart for details).    ATTESTATIONS  Documentation assistance was provided by Andrey Farmer, Scribe on August 26, 2020 at 4:33 PM for Alric Seton, DO    August 27, 2020 8:54 AM. Documentation assistance provided by the Scribe. I was present during the time the encounter was recorded. The information recorded by the Scribe was done at my direction and has been reviewed and validated by me.         Antonieta Pert Tobey Lippard, DO  08/27/20 [phone]

## 2020-08-26 NOTE — Unmapped (Signed)
I went to see my PCP on 4/5 and I thought I had a UTi and I had a kidney infection. C/O pain on right side flank area and rib area. The pain is unbearable now. I have been taking antibiotic every 12 hours since I got it on Wednesday 4/6.

## 2020-09-06 ENCOUNTER — Other Ambulatory Visit: Payer: Self-pay | Admitting: Orthopedic Surgery

## 2020-09-11 MED ORDER — TRAMADOL 50 MG TABLET
ORAL_TABLET | Freq: Four times a day (QID) | ORAL | 0 refills | 3 days | PRN
Start: 2020-09-11 — End: ?

## 2020-09-11 NOTE — Unmapped (Signed)
Patient Requests Medication Refill    ??? Name of medication: tramadol 50mg    ??? Is there a preferred amount requested (e.g. 30 pills, 3 months worth - if no leave blank): 90 days  ??? Desired pharmacy (defaults to patient's preferred pharmacy list - confirm or change):    [id number] - [place], [place] - [address]  [address]  [place] [address]  Phone: [phone] Fax: [phone]      ??? Best callback number if any questions (defaults to patient's preferred phone - confirm or change): [phone]  ??? PCP: Karrie Doffing, PA  ??? Last encounter in department: 08/22/2020 (If more than a year, offer an appointment.)

## 2020-09-11 NOTE — Unmapped (Signed)
Refill request for Tramadol. Unable to refill. Routing to [facility], [place]

## 2020-09-14 MED FILL — HYDROXYZINE HCL 10 MG TABLET: ORAL | 24 days supply | Qty: 70 | Fill #1

## 2020-09-14 MED FILL — DICYCLOMINE 20 MG TABLET: ORAL | 25 days supply | Qty: 100 | Fill #2

## 2020-09-17 DIAGNOSIS — M0579 Rheumatoid arthritis with rheumatoid factor of multiple sites without organ or systems involvement: Principal | ICD-10-CM

## 2020-09-20 ENCOUNTER — Other Ambulatory Visit: Payer: Self-pay

## 2020-09-20 NOTE — Unmapped (Signed)
Have been thinking again about stopping.  She says it helps some, but continued marijuana use (though seems less). What do you think?

## 2020-09-22 NOTE — Unmapped (Signed)
I reviewed the controlled substance database.  Pt is receiving hydrocodone and tramadol from two different providers in [place] as well as tramadol here from myself.  I will decline tramadol at this time given multiple providers, cannabinoids in urine and lack of clinical response.  She and I can discuss more formal pain management again in the future, ie our pain service.    Bethany Meyer

## 2020-09-29 NOTE — Unmapped (Signed)
Complex Case Management  SUMMARY NOTE    [facility] Care Coordinator spoke with patient and verified correct patient using two identifiers today to introduce the Complex Case Management program.     Discussed the following:  Program Services, Expectations of participation and Verified Demographics    Program status: Declined  Additional items discussed: Patient states that she doesn't need the services of the Complex Case Management program at this time. Care Coordinator has voiced understanding and will send our contact information to patient via her [place] My Chart shall she need to utilize our services in the future as patient has voiced understanding.         CC Note     This patient has declined Complex Case Management services on 09/29/20. To have this patent reevaluated for Complex Case Management please send an AMB Referral to Case Management to the [facility] Department.       Deliah Goody - High Risk Care Coordinator   [facility]  [address], [address] [place], [place] [address]  P: [phone] F: [phone]  [email or web address]

## 2020-10-03 ENCOUNTER — Other Ambulatory Visit: Payer: Self-pay

## 2020-10-03 ENCOUNTER — Encounter: Payer: Self-pay | Admitting: Vascular Surgery

## 2020-10-03 ENCOUNTER — Ambulatory Visit (INDEPENDENT_AMBULATORY_CARE_PROVIDER_SITE_OTHER): Payer: No Typology Code available for payment source | Admitting: Vascular Surgery

## 2020-10-03 DIAGNOSIS — M5442 Lumbago with sciatica, left side: Secondary | ICD-10-CM | POA: Diagnosis not present

## 2020-10-03 DIAGNOSIS — G8929 Other chronic pain: Secondary | ICD-10-CM | POA: Diagnosis not present

## 2020-10-03 DIAGNOSIS — M5441 Lumbago with sciatica, right side: Secondary | ICD-10-CM
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
## 2020-10-06 NOTE — Progress Notes (Addendum)
Surgical Instructions    Your procedure is scheduled on May 25  Report to [facility] Main Entrance "A" at 0530 A.M., then check in with the Admitting office.  Call this number if you have problems the morning of surgery:  [phone]   If you have any questions prior to your surgery date call [phone]: Open Monday-Friday 8am-4pm    Remember:  Do not eat  after midnight the night before your surgery  You may drink clear liquids until 0430 am the morning of your surgery.   Clear liquids allowed are: Water, Non-Citrus Juices (without pulp), Carbonated Beverages, Clear Tea, Black Coffee Only, and Gatorade   Enhanced Recovery after Surgery for Orthopedics Enhanced Recovery after Surgery is a protocol used to improve the stress on your body and your recovery after surgery.  Patient Instructions  . The day of surgery (if you do NOT have diabetes):  o Drink ONE (1) Pre-Surgery Clear Ensure by __0430___ am the morning of surgery   o This drink was given to you during your hospital  pre-op appointment visit. o Nothing else to drink after completing the  Pre-Surgery Clear Ensure.          If you have questions, please contact your surgeon's office.     Take these medicines the morning of surgery with A SIP OF WATER  acetaminophen (TYLENOL) if needed Baclofen  If needed buPROPion (WELLBUTRIN XL) DULoxetine (CYMBALTA) gabapentin (NEURONTIN)  omeprazole (PRILOSEC) predniSONE (DELTASONE)   As of today, STOP taking any Aspirin (unless otherwise instructed by your surgeon) Aleve, Naproxen, Ibuprofen, Motrin, Advil, Goody's, BC's, all herbal medications, fish oil, and all vitamins.                     Do not wear jewelry, make up, or nail polish            Do not wear lotions, powders, perfumes, or deodorant.            Do not shave 48 hours prior to surgery.              Do not bring valuables to the hospital.            [facility] is not responsible for any belongings or  valuables.  Do NOT Smoke (Tobacco/Vaping) or drink Alcohol 24 hours prior to your procedure If you use a CPAP at night, you may bring all equipment for your overnight stay.   Contacts, glasses, dentures or bridgework may not be worn into surgery, please bring cases for these belongings   For patients admitted to the hospital, discharge time will be determined by your treatment team.   Patients discharged the day of surgery will not be allowed to drive home, and someone needs to stay with them for 24 hours.    Special instructions:    Oral Hygiene is also important to reduce your risk of infection.  Remember - BRUSH YOUR TEETH THE MORNING OF SURGERY WITH YOUR REGULAR TOOTHPASTE   [place]- Preparing For Surgery  Before surgery, you can play an important role. Because skin is not sterile, your skin needs to be as free of germs as possible. You can reduce the number of germs on your skin by washing with CHG (chlorahexidine gluconate) Soap before surgery.  CHG is an antiseptic cleaner which kills germs and bonds with the skin to continue killing germs even after washing.     Please do not use if you have an allergy  to CHG or antibacterial soaps. If your skin becomes reddened/irritated stop using the CHG.  Do not shave (including legs and underarms) for at least 48 hours prior to first CHG shower. It is OK to shave your face.  Please follow these instructions carefully.    1.  Shower the NIGHT BEFORE SURGERY and the MORNING OF SURGERY with CHG Soap.   If you chose to wash your hair, wash your hair first as usual with your normal shampoo. After you shampoo, rinse your hair and body thoroughly to remove the shampoo.  Then [employer] and genitals (private parts) with your normal soap and rinse thoroughly to remove soap.  2. After that Use CHG Soap as you would any other liquid soap. You can apply CHG directly to the skin and wash gently with a scrungie or a clean washcloth.   3. Apply the  CHG Soap to your body ONLY FROM THE NECK DOWN.  Do not use on open wounds or open sores. Avoid contact with your eyes, ears, mouth and genitals (private parts). Wash Face and genitals (private parts)  with your normal soap.   4. Wash thoroughly, paying special attention to the area where your surgery will be performed.  5. Thoroughly rinse your body with warm water from the neck down.  6. DO NOT shower/wash with your normal soap after using and rinsing off the CHG Soap.  7. Pat yourself dry with a CLEAN TOWEL.  8. Wear CLEAN PAJAMAS to bed the night before surgery  9. Place CLEAN SHEETS on your bed the night before your surgery  10. DO NOT SLEEP WITH PETS.   Day of Surgery:  Take a shower with CHG soap. Wear Clean/Comfortable clothing the morning of surgery Do not apply any deodorants/lotions.   Remember to brush your teeth WITH YOUR REGULAR TOOTHPASTE.   Please read over the following fact sheets that you were given.

## 2020-10-09 ENCOUNTER — Other Ambulatory Visit: Payer: Self-pay

## 2020-10-09 ENCOUNTER — Encounter (HOSPITAL_COMMUNITY)
Admission: RE | Admit: 2020-10-09 | Discharge: 2020-10-09 | Disposition: A | Discharge status: Home / Self Care | Payer: No Typology Code available for payment source | Source: Ambulatory Visit | Attending: Orthopedic Surgery | Admitting: Orthopedic Surgery

## 2020-10-09 ENCOUNTER — Encounter (HOSPITAL_COMMUNITY): Payer: Self-pay

## 2020-10-09 DIAGNOSIS — Z803 Family history of malignant neoplasm of breast: Secondary | ICD-10-CM

## 2020-10-09 DIAGNOSIS — K219 Gastro-esophageal reflux disease without esophagitis: Secondary | ICD-10-CM | POA: Diagnosis present

## 2020-10-09 DIAGNOSIS — M5116 Intervertebral disc disorders with radiculopathy, lumbar region: Principal | ICD-10-CM | POA: Diagnosis present

## 2020-10-09 DIAGNOSIS — F32A Depression, unspecified: Secondary | ICD-10-CM | POA: Diagnosis present

## 2020-10-09 DIAGNOSIS — Z01818 Encounter for other preprocedural examination: Secondary | ICD-10-CM | POA: Insufficient documentation

## 2020-10-09 DIAGNOSIS — F17200 Nicotine dependence, unspecified, uncomplicated: Secondary | ICD-10-CM | POA: Diagnosis present

## 2020-10-09 DIAGNOSIS — M069 Rheumatoid arthritis, unspecified: Secondary | ICD-10-CM | POA: Diagnosis present

## 2020-10-09 DIAGNOSIS — I4892 Unspecified atrial flutter: Secondary | ICD-10-CM | POA: Diagnosis present

## 2020-10-09 DIAGNOSIS — Z20822 Contact with and (suspected) exposure to covid-19: Secondary | ICD-10-CM | POA: Insufficient documentation

## 2020-10-09 DIAGNOSIS — I4891 Unspecified atrial fibrillation: Secondary | ICD-10-CM | POA: Diagnosis present

## 2020-10-09 DIAGNOSIS — G9340 Encephalopathy, unspecified: Secondary | ICD-10-CM | POA: Diagnosis present

## 2020-10-09 DIAGNOSIS — Z79899 Other long term (current) drug therapy: Secondary | ICD-10-CM

## 2020-10-09 DIAGNOSIS — M4316 Spondylolisthesis, lumbar region: Secondary | ICD-10-CM | POA: Diagnosis present

## 2020-10-09 DIAGNOSIS — I700 Atherosclerosis of aorta: Secondary | ICD-10-CM | POA: Diagnosis present

## 2020-10-09 DIAGNOSIS — M48061 Spinal stenosis, lumbar region without neurogenic claudication: Secondary | ICD-10-CM | POA: Diagnosis present

## 2020-10-09 DIAGNOSIS — Z833 Family history of diabetes mellitus: Secondary | ICD-10-CM

## 2020-10-09 LAB — CBC WITH DIFFERENTIAL/PLATELET
Abs Immature Granulocytes: 0.04 10*3/uL (ref 0.00–0.07)
Basophils Absolute: 0.1 10*3/uL (ref 0.0–0.1)
Basophils Relative: 1 %
Eosinophils Absolute: 0.1 10*3/uL (ref 0.0–0.5)
Eosinophils Relative: 1 %
HCT: 40.7 % (ref 36.0–46.0)
Hemoglobin: 13.4 g/dL (ref 12.0–15.0)
Immature Granulocytes: 1 %
Lymphocytes Relative: 36 %
Lymphs Abs: 2 10*3/uL (ref 0.7–4.0)
MCH: 33.9 pg (ref 26.0–34.0)
MCHC: 32.9 g/dL (ref 30.0–36.0)
MCV: 103 fL — ABNORMAL HIGH (ref 80.0–100.0)
Monocytes Absolute: 0.5 10*3/uL (ref 0.1–1.0)
Monocytes Relative: 9 %
Neutro Abs: 2.8 10*3/uL (ref 1.7–7.7)
Neutrophils Relative %: 52 %
Platelets: 349 10*3/uL (ref 150–400)
RBC: 3.95 MIL/uL (ref 3.87–5.11)
RDW: 14.7 % (ref 11.5–15.5)
WBC: 5.4 10*3/uL (ref 4.0–10.5)
nRBC: 0 % (ref 0.0–0.2)

## 2020-10-09 LAB — URINALYSIS, ROUTINE W REFLEX MICROSCOPIC
Bilirubin Urine: NEGATIVE
Glucose, UA: NEGATIVE mg/dL
Hgb urine dipstick: NEGATIVE
Ketones, ur: NEGATIVE mg/dL
Nitrite: NEGATIVE
Protein, ur: NEGATIVE mg/dL
Specific Gravity, Urine: 1.032 — ABNORMAL HIGH (ref 1.005–1.030)
pH: 5 (ref 5.0–8.0)

## 2020-10-09 LAB — COMPREHENSIVE METABOLIC PANEL
ALT: 17 U/L (ref 0–44)
AST: 22 U/L (ref 15–41)
Albumin: 4.2 g/dL (ref 3.5–5.0)
Alkaline Phosphatase: 62 U/L (ref 38–126)
Anion gap: 8 (ref 5–15)
BUN: 31 mg/dL — ABNORMAL HIGH (ref 8–23)
CO2: 23 mmol/L (ref 22–32)
Calcium: 9.7 mg/dL (ref 8.9–10.3)
Chloride: 108 mmol/L (ref 98–111)
Creatinine, Ser: 1.26 mg/dL — ABNORMAL HIGH (ref 0.44–1.00)
GFR, Estimated: 48 mL/min — ABNORMAL LOW (ref >60)
Glucose, Bld: 55 mg/dL — ABNORMAL LOW (ref 70–99)
Potassium: 3.5 mmol/L (ref 3.5–5.1)
Sodium: 139 mmol/L (ref 135–145)
Total Bilirubin: 0.5 mg/dL (ref 0.3–1.2)
Total Protein: 7.8 g/dL (ref 6.5–8.1)

## 2020-10-09 LAB — APTT: aPTT: 30 seconds (ref 24–36)

## 2020-10-09 LAB — TYPE AND SCREEN
ABO/RH(D): A POS
Antibody Screen: NEGATIVE

## 2020-10-09 LAB — SURGICAL PCR SCREEN
MRSA, PCR: NEGATIVE
Staphylococcus aureus: NEGATIVE

## 2020-10-09 LAB — PROTIME-INR
INR: 1 (ref 0.8–1.2)
Prothrombin Time: 13.3 seconds (ref 11.4–15.2)

## 2020-10-09 LAB — SARS CORONAVIRUS 2 (TAT 6-24 HRS): SARS Coronavirus 2: NEGATIVE

## 2020-10-09 NOTE — Progress Notes (Addendum)
PCP - Dr. Gabrielle Dare Cardiologist - denies  Chest x-ray - 11/18/19 EKG - 10/09/20 Stress Test - DENIES ECHO - 02/13/16-CE Cardiac Cath - DENIES  Sleep Study - denies CPAP - denies  Blood Thinner Instructions: N/A Aspirin Instructions: N/A  ERAS Protcol -Clear liquids until 0430 DOS PRE-SURGERY Ensure or G2- (1) pre-surgical ensure provided.  COVID TEST- 10/09/20; pending.   Anesthesia review: yes, hx of A. Fib, EKG review  Patient denies shortness of breath, fever, cough and chest pain at PAT appointment   All instructions explained to the patient, with a verbal understanding of the material. Patient agrees to go over the instructions while at home for a better understanding. Patient also instructed to self quarantine after being tested for COVID-19. The opportunity to ask questions was provided.

## 2020-10-09 NOTE — Progress Notes (Signed)
Surgical Instructions    Your procedure is scheduled on Wednesday, May 25th.  Report to [facility] Main Entrance "A" at 5:30 A.M., then check in with the Admitting office.  Call this number if you have problems the morning of surgery:  [phone]   If you have any questions prior to your surgery date call [phone]: Open Monday-Friday 8am-4pm    Remember:  Do not eat after midnight the night before your surgery  You may drink clear liquids until 4:30 am the morning of your surgery.   Clear liquids allowed are: Water, Non-Citrus Juices (without pulp), Carbonated Beverages, Clear Tea, Black Coffee Only, and Gatorade   Enhanced Recovery after Surgery for Orthopedics Enhanced Recovery after Surgery is a protocol used to improve the stress on your body and your recovery after surgery.  Patient Instructions  . The day of surgery (if you do NOT have diabetes):  o Drink ONE (1) Pre-Surgery Clear Ensure by 0430 am the morning of surgery   o This drink was given to you during your hospital  pre-op appointment visit. o Nothing else to drink after completing the  Pre-Surgery Clear Ensure.          If you have questions, please contact your surgeon's office.     Take these medicines the morning of surgery with A SIP OF WATER  buPROPion (WELLBUTRIN XL) gabapentin (NEURONTIN)  haloperidol (HALDOL)   Take these medications AS NEEDED acetaminophen (TYLENOL)-if needed dicyclomine (BENTYL) -if needed hydrOXYzine (ATARAX/VISTARIL)  traMADol (ULTRAM)   As of today, STOP taking any Aspirin (unless otherwise instructed by your surgeon) Aleve, Naproxen, Ibuprofen, Motrin, Advil, Goody's, BC's, all herbal medications, fish oil, and all vitamins.                     Do not wear jewelry, make up, or nail polish            Do not wear lotions, powders, perfumes, or deodorant.            Do not shave 48 hours prior to surgery.              Do not bring valuables to the hospital.             [facility] is not responsible for any belongings or valuables.  Do NOT Smoke (Tobacco/Vaping) or drink Alcohol 24 hours prior to your procedure If you use a CPAP at night, you may bring all equipment for your overnight stay.   Contacts, glasses, dentures or bridgework may not be worn into surgery, please bring cases for these belongings   For patients admitted to the hospital, discharge time will be determined by your treatment team.   Patients discharged the day of surgery will not be allowed to drive home, and someone needs to stay with them for 24 hours.    Special instructions:    Oral Hygiene is also important to reduce your risk of infection.  Remember - BRUSH YOUR TEETH THE MORNING OF SURGERY WITH YOUR REGULAR TOOTHPASTE   [place]- Preparing For Surgery  Before surgery, you can play an important role. Because skin is not sterile, your skin needs to be as free of germs as possible. You can reduce the number of germs on your skin by washing with CHG (chlorahexidine gluconate) Soap before surgery.  CHG is an antiseptic cleaner which kills germs and bonds with the skin to continue killing germs even after washing.     Please do  not use if you have an allergy to CHG or antibacterial soaps. If your skin becomes reddened/irritated stop using the CHG.  Do not shave (including legs and underarms) for at least 48 hours prior to first CHG shower. It is OK to shave your face.  Please follow these instructions carefully.    1.  Shower the NIGHT BEFORE SURGERY and the MORNING OF SURGERY with CHG Soap.   If you chose to wash your hair, wash your hair first as usual with your normal shampoo. After you shampoo, rinse your hair and body thoroughly to remove the shampoo.  Then [employer] and genitals (private parts) with your normal soap and rinse thoroughly to remove soap.  2. After that Use CHG Soap as you would any other liquid soap. You can apply CHG directly to the skin and wash gently  with a scrungie or a clean washcloth.   3. Apply the CHG Soap to your body ONLY FROM THE NECK DOWN.  Do not use on open wounds or open sores. Avoid contact with your eyes, ears, mouth and genitals (private parts). Wash Face and genitals (private parts)  with your normal soap.   4. Wash thoroughly, paying special attention to the area where your surgery will be performed.  5. Thoroughly rinse your body with warm water from the neck down.  6. DO NOT shower/wash with your normal soap after using and rinsing off the CHG Soap.  7. Pat yourself dry with a CLEAN TOWEL.  8. Wear CLEAN PAJAMAS to bed the night before surgery  9. Place CLEAN SHEETS on your bed the night before your surgery  10. DO NOT SLEEP WITH PETS.   Day of Surgery:  Take a shower with CHG soap. Wear Clean/Comfortable clothing the morning of surgery Do not apply any deodorants/lotions.   Remember to brush your teeth WITH YOUR REGULAR TOOTHPASTE.   Please read over the following fact sheets that you were given.

## 2020-10-10 NOTE — Progress Notes (Signed)
Anesthesia Chart Review:   Case: [id number] Date/Time: 10/11/20 0715   Procedures:      ANTERIOR LUMBAR INTERBODY FUSION LUMBAR 4 - LUMBAR 5 WITH POSTERIOR SPINAL FUSION WITH INSTRUMENTATION AND ALLOGRAFT (N/A )     POSTERIOR LUMBAR FUSION 1 LEVEL (N/A )     ABDOMINAL EXPOSURE (N/A )   Anesthesia type: General   Pre-op diagnosis: CONSTANT RIGHT LEG PAIN AND WEAKNESS, MRI NOTABLE FOR SEVERE SPINAL STENOSIS AT LUMBAR 4- LUMBAR 5   Location: [facility] ROOM 05 / [facility] OR   Surgeons: Estill Bamberg, MD; Cephus Shelling, MD      DISCUSSION: Pt is 65 years old with hx atrial flutter (s/p ablation 12/26/15), RA. Current smoker.   Per notes from PCP (see care everywhere note dated 03/20/16), pt saw cardiology in 2017 for atrial flutter; a flutter resolved and after normal echo and ziopatch (see below), xarelto was stopped.    VS: BP (!) 143/81   Pulse 65   Temp 36.4 C (Oral)   Resp 18   Ht 5' 6.5" (1.689 m)   Wt 48.2 kg   SpO2 100%   BMI 16.89 kg/m   PROVIDERS: - PCP is Colford, Moshe Salisbury, MD   LABS: Labs reviewed: Acceptable for surgery.  - I notified Lupita Leash in Dr. Marshell Levan office about UA results  (all labs ordered are listed, but only abnormal results are displayed)  Labs Reviewed  CBC WITH DIFFERENTIAL/PLATELET - Abnormal; Notable for the following components:      Result Value   MCV 103.0 (*)    All other components within normal limits  COMPREHENSIVE METABOLIC PANEL - Abnormal; Notable for the following components:   Glucose, Bld 55 (*)    BUN 31 (*)    Creatinine, Ser 1.26 (*)    GFR, Estimated 48 (*)    All other components within normal limits  URINALYSIS, ROUTINE W REFLEX MICROSCOPIC - Abnormal; Notable for the following components:   APPearance HAZY (*)    Specific Gravity, Urine 1.032 (*)    Leukocytes,Ua TRACE (*)    Bacteria, UA FEW (*)    All other components within normal limits  SURGICAL PCR SCREEN  SARS CORONAVIRUS 2 (TAT 6-24 HRS)  PROTIME-INR  APTT   TYPE AND SCREEN     IMAGES: CT abd/pelvis 08/26/20 (care everywhere):  1. No evidence of acute abnormalities within the abdomen or pelvis.  2. Large hiatal hernia.  3. 9 mm anterolisthesis of L4 on L5.  4. Aortic atherosclerosis.   CT chest 05/24/20 (care everywhere):   Mild diffuse bronchial wall thickening with centrilobular groundglass nodules in bilateral upper lobe; may represent respiratory bronchiolitis versus smoking-related lung disease.   Interval resolution of previously described ground glass opacities asclinically questioned.   Interval increase in size sliding-type hiatal hernia, now moderate.   CXR 11/18/19:  - The heart size and mediastinal contours are within normal limits. Both lungs are clear. The visualized skeletal structures are unremarkable. No pneumothorax   EKG 10/09/20: Sinus rhythm with occasional PVCs   CV: Cardiac event monitor 03/04/16 (care everywhere): - Analysis time: 13 days 14 hours  - Indication: Unspecified atrial flutter  Findings:  - Patient had a min HR of 57 bpm, max HR of 226 bpm, and avg HR of 90 bpm  - Predominant underlying rhythm was Sinus Rhythm  - 19 Supraventricular Tachycardia runs occurred, the run with the fastest interval lasting 4 beats with a max rate of 226 bpm, the longest lasting 20  beats with an avg rate of 150 bpm  - Longest episode of Supraventricular Tachycardia may be possibleAtrial Tachycardia with variable block  - Possible Ectopic Atrial Rhythm was present  - Isolated SVEs were rare (<1.0%), SVE Couplets were rare (<1.0%), and SVE Triplets were rare (<1.0%)  - Isolated VEs were rare (<1.0%), and no VE Couplets or VE Triplets were present.  - Number of Triggered Events: 6  - Findings within  45 sec of Triggers: Sinus Rhythm, Supraventricular Ectopic beat(s)    Echo 02/13/16 (care everywhere):   Normal left ventricular systolic function, ejection fraction > 55%   Normal right ventricular systolic function    No significant valvular abnormalities   Past Medical History:  Diagnosis Date  . A-fib (HCC)   . Depression   . GERD (gastroesophageal reflux disease)   . RA (rheumatoid arthritis) (HCC)   . Rheumatoid arthritis [facility])     Past Surgical History:  Procedure Laterality Date  . ATRIAL FIBRILLATION ABLATION    . OVARIAN CYST REMOVAL      MEDICATIONS: . acetaminophen (TYLENOL) 325 MG tablet  . buPROPion (WELLBUTRIN XL) 150 MG 24 hr tablet  . Cholecalciferol (D3 PO)  . cyclobenzaprine (FLEXERIL) 5 MG tablet  . dicyclomine (BENTYL) 20 MG tablet  . folic acid (FOLVITE) 1 MG tablet  . gabapentin (NEURONTIN) 300 MG capsule  . haloperidol (HALDOL) 0.5 MG tablet  . hydrOXYzine (ATARAX/VISTARIL) 10 MG tablet  . OTREXUP 25 MG/0.4ML SOAJ  . polyethylene glycol (MIRALAX / GLYCOLAX) packet  . traMADol (ULTRAM) 50 MG tablet   No current facility-administered medications for this encounter.    If no changes, I anticipate pt can proceed with surgery as scheduled.   Rica Mast, PhD, FNP-BC [facility] Short Stay Surgical Center/Anesthesiology Phone: [phone] 10/10/2020 9:57 AM

## 2020-10-10 NOTE — Anesthesia Preprocedure Evaluation (Addendum)
Anesthesia Evaluation  Patient identified by MRN, date of birth, ID band Patient awake    Reviewed: Allergy & Precautions, NPO status , Patient's Chart, lab work & pertinent test results  Airway Mallampati: II  TM Distance: >3 FB Neck ROM: Full    Dental  (+) Teeth Intact   Pulmonary neg pulmonary ROS, Current Smoker and Patient abstained from smoking.,    Pulmonary exam normal        Cardiovascular negative cardio ROS  + dysrhythmias Atrial Fibrillation  Rhythm:Regular Rate:Normal     Neuro/Psych Depression negative neurological ROS     GI/Hepatic Neg liver ROS, GERD  ,  Endo/Other  negative endocrine ROS  Renal/GU negative Renal ROS  negative genitourinary   Musculoskeletal  (+) Arthritis , Rheumatoid disorders,    Abdominal (+)  Abdomen: soft. Bowel sounds: normal.  Peds  Hematology negative hematology ROS (+)   Anesthesia Other Findings   Reproductive/Obstetrics                           Anesthesia Physical Anesthesia Plan  ASA: II  Anesthesia Plan: General   Post-op Pain Management:    Induction: Intravenous  PONV Risk Score and Plan: 2 and Ondansetron, Dexamethasone, Midazolam and Treatment may vary due to age or medical condition  Airway Management Planned: Mask and Oral ETT  Additional Equipment: None  Intra-op Plan:   Post-operative Plan: Extubation in OR  Informed Consent: I have reviewed the patients History and Physical, chart, labs and discussed the procedure including the risks, benefits and alternatives for the proposed anesthesia with the patient or authorized representative who has indicated his/her understanding and acceptance.     Dental advisory given  Plan Discussed with: CRNA  Anesthesia Plan Comments: (See APP note by Joslyn Hy, FNP  Lab Results      Component                Value               Date                      WBC                       5.4                 10/09/2020                HGB                      13.4                10/09/2020                HCT                      40.7                10/09/2020                MCV                      103.0 (H)           10/09/2020                PLT  349                 10/09/2020           Lab Results      Component                Value               Date                      NA                       139                 10/09/2020                K                        3.5                 10/09/2020                CO2                      23                  10/09/2020                GLUCOSE                  55 (L)              10/09/2020                BUN                      31 (H)              10/09/2020                CREATININE               1.26 (H)            10/09/2020                CALCIUM                  9.7                 10/09/2020                GFRNONAA                 48 (L)              10/09/2020                GFRAA                    59 (L)              05/23/2018          )      Anesthesia Quick Evaluation

## 2020-10-11 ENCOUNTER — Inpatient Hospital Stay (HOSPITAL_COMMUNITY): Payer: No Typology Code available for payment source | Admitting: Emergency Medicine

## 2020-10-11 ENCOUNTER — Inpatient Hospital Stay (HOSPITAL_COMMUNITY)
Admission: RE | Admit: 2020-10-11 | Discharge: 2020-10-12 | DRG: 460 | Disposition: A | Discharge status: Home Health | Payer: No Typology Code available for payment source | Attending: Orthopedic Surgery | Admitting: Orthopedic Surgery

## 2020-10-11 ENCOUNTER — Inpatient Hospital Stay (HOSPITAL_COMMUNITY): Payer: No Typology Code available for payment source | Admitting: Certified Registered"

## 2020-10-11 ENCOUNTER — Inpatient Hospital Stay (HOSPITAL_COMMUNITY): Payer: No Typology Code available for payment source

## 2020-10-11 ENCOUNTER — Inpatient Hospital Stay (HOSPITAL_COMMUNITY)
Admission: RE | Disposition: A | Discharge status: Home / Self Care | Payer: Self-pay | Source: Home / Self Care | Attending: Orthopedic Surgery

## 2020-10-11 DIAGNOSIS — M5116 Intervertebral disc disorders with radiculopathy, lumbar region: Secondary | ICD-10-CM | POA: Diagnosis present

## 2020-10-11 DIAGNOSIS — F32A Depression, unspecified: Secondary | ICD-10-CM | POA: Diagnosis present

## 2020-10-11 DIAGNOSIS — I700 Atherosclerosis of aorta: Secondary | ICD-10-CM | POA: Diagnosis present

## 2020-10-11 DIAGNOSIS — M545 Low back pain, unspecified: Secondary | ICD-10-CM | POA: Diagnosis not present

## 2020-10-11 DIAGNOSIS — Z803 Family history of malignant neoplasm of breast: Secondary | ICD-10-CM | POA: Diagnosis not present

## 2020-10-11 DIAGNOSIS — G9340 Encephalopathy, unspecified: Secondary | ICD-10-CM | POA: Diagnosis present

## 2020-10-11 DIAGNOSIS — M48061 Spinal stenosis, lumbar region without neurogenic claudication: Secondary | ICD-10-CM | POA: Diagnosis present

## 2020-10-11 DIAGNOSIS — M069 Rheumatoid arthritis, unspecified: Secondary | ICD-10-CM | POA: Diagnosis present

## 2020-10-11 DIAGNOSIS — Z419 Encounter for procedure for purposes other than remedying health state, unspecified: Secondary | ICD-10-CM

## 2020-10-11 DIAGNOSIS — M4316 Spondylolisthesis, lumbar region: Secondary | ICD-10-CM | POA: Diagnosis present

## 2020-10-11 DIAGNOSIS — R4182 Altered mental status, unspecified: Secondary | ICD-10-CM

## 2020-10-11 DIAGNOSIS — Z20822 Contact with and (suspected) exposure to covid-19: Secondary | ICD-10-CM | POA: Diagnosis present

## 2020-10-11 DIAGNOSIS — M5410 Radiculopathy, site unspecified: Secondary | ICD-10-CM | POA: Diagnosis present

## 2020-10-11 DIAGNOSIS — G8929 Other chronic pain: Secondary | ICD-10-CM | POA: Diagnosis present

## 2020-10-11 DIAGNOSIS — K219 Gastro-esophageal reflux disease without esophagitis: Secondary | ICD-10-CM | POA: Diagnosis present

## 2020-10-11 DIAGNOSIS — I4892 Unspecified atrial flutter: Secondary | ICD-10-CM | POA: Diagnosis present

## 2020-10-11 DIAGNOSIS — Z833 Family history of diabetes mellitus: Secondary | ICD-10-CM | POA: Diagnosis not present

## 2020-10-11 DIAGNOSIS — I4891 Unspecified atrial fibrillation: Secondary | ICD-10-CM | POA: Diagnosis present

## 2020-10-11 DIAGNOSIS — Z79899 Other long term (current) drug therapy: Secondary | ICD-10-CM | POA: Diagnosis not present

## 2020-10-11 DIAGNOSIS — F17200 Nicotine dependence, unspecified, uncomplicated: Secondary | ICD-10-CM | POA: Diagnosis present

## 2020-10-11 HISTORY — PX: ANTERIOR LUMBAR FUSION: SHX1170

## 2020-10-11 HISTORY — PX: ABDOMINAL EXPOSURE: SHX5708

## 2020-10-11 LAB — GLUCOSE, CAPILLARY: Glucose-Capillary: 270 mg/dL — ABNORMAL HIGH (ref 70–99)

## 2020-10-11 LAB — ABO/RH: ABO/RH(D): A POS

## 2020-10-11 SURGERY — ANTERIOR LUMBAR FUSION 1 LEVEL
Anesthesia: General

## 2020-10-11 MED ORDER — DEXAMETHASONE SODIUM PHOSPHATE 10 MG/ML IJ SOLN
INTRAMUSCULAR | Status: DC | PRN
  Administered 2020-10-11: 10 mg via INTRAVENOUS

## 2020-10-11 MED ORDER — HYDROCODONE-ACETAMINOPHEN 5-325 MG PO TABS
1.0000 | ORAL_TABLET | ORAL | Status: DC | PRN
  Administered 2020-10-12: 1 via ORAL
  Filled 2020-10-11: qty 1

## 2020-10-11 MED ORDER — OXYCODONE HCL 5 MG PO TABS: 5.0000 mg | ORAL_TABLET | Freq: Once | ORAL | Status: DC | PRN

## 2020-10-11 MED ORDER — CHOLECALCIFEROL 50 MCG (2000 UT) PO TABS: ORAL_TABLET | Freq: Every day | ORAL | Status: DC

## 2020-10-11 MED ORDER — PROPOFOL 10 MG/ML IV BOLUS
INTRAVENOUS | Status: AC
  Filled 2020-10-11: qty 20

## 2020-10-11 MED ORDER — SUGAMMADEX SODIUM 200 MG/2ML IV SOLN
INTRAVENOUS | Status: DC | PRN
  Administered 2020-10-11: 100 mg via INTRAVENOUS

## 2020-10-11 MED ORDER — PROPOFOL 10 MG/ML IV BOLUS
INTRAVENOUS | Status: DC | PRN
  Administered 2020-10-11: 140 mg via INTRAVENOUS

## 2020-10-11 MED ORDER — BUPIVACAINE-EPINEPHRINE (PF) 0.25% -1:200000 IJ SOLN
INTRAMUSCULAR | Status: DC | PRN
  Administered 2020-10-11 (×2): 12.5 mL via PERINEURAL

## 2020-10-11 MED ORDER — DICYCLOMINE HCL 20 MG PO TABS
20.0000 mg | ORAL_TABLET | Freq: Four times a day (QID) | ORAL | Status: DC | PRN
  Administered 2020-10-12: 20 mg via ORAL
  Filled 2020-10-11 (×2): qty 1

## 2020-10-11 MED ORDER — FENTANYL CITRATE (PF) 100 MCG/2ML IJ SOLN
INTRAMUSCULAR | Status: AC
  Filled 2020-10-11: qty 2

## 2020-10-11 MED ORDER — ZOLPIDEM TARTRATE 5 MG PO TABS: 5.0000 mg | ORAL_TABLET | Freq: Every evening | ORAL | Status: DC | PRN

## 2020-10-11 MED ORDER — KETAMINE HCL 50 MG/5ML IJ SOSY
PREFILLED_SYRINGE | INTRAMUSCULAR | Status: AC
  Filled 2020-10-11: qty 5

## 2020-10-11 MED ORDER — POTASSIUM CHLORIDE IN NACL 20-0.9 MEQ/L-% IV SOLN: INTRAVENOUS | Status: DC

## 2020-10-11 MED ORDER — METHYLENE BLUE 0.5 % INJ SOLN
INTRAVENOUS | Status: AC
  Filled 2020-10-11: qty 10

## 2020-10-11 MED ORDER — BUPIVACAINE LIPOSOME 1.3 % IJ SUSP
INTRAMUSCULAR | Status: AC
  Filled 2020-10-11: qty 20

## 2020-10-11 MED ORDER — LIDOCAINE 2% (20 MG/ML) 5 ML SYRINGE
INTRAMUSCULAR | Status: DC | PRN
  Administered 2020-10-11: 60 mg via INTRAVENOUS

## 2020-10-11 MED ORDER — FENTANYL CITRATE (PF) 250 MCG/5ML IJ SOLN
INTRAMUSCULAR | Status: AC
  Filled 2020-10-11: qty 5

## 2020-10-11 MED ORDER — ALUM & MAG HYDROXIDE-SIMETH 200-200-20 MG/5ML PO SUSP: 30.0000 mL | Freq: Four times a day (QID) | ORAL | Status: DC | PRN

## 2020-10-11 MED ORDER — HYDROMORPHONE HCL 1 MG/ML IJ SOLN
0.5000 mg | INTRAMUSCULAR | Status: DC | PRN
  Administered 2020-10-12: 1 mg via INTRAVENOUS
  Filled 2020-10-11: qty 1

## 2020-10-11 MED ORDER — ROCURONIUM BROMIDE 10 MG/ML (PF) SYRINGE
PREFILLED_SYRINGE | INTRAVENOUS | Status: AC
  Filled 2020-10-11: qty 10

## 2020-10-11 MED ORDER — CHLORHEXIDINE GLUCONATE 4 % EX LIQD: 60.0000 mL | Freq: Once | CUTANEOUS | Status: DC

## 2020-10-11 MED ORDER — SODIUM CHLORIDE 0.9 % IV SOLN
250.0000 mL | INTRAVENOUS | Status: DC
  Administered 2020-10-11: 250 mL via INTRAVENOUS

## 2020-10-11 MED ORDER — DOCUSATE SODIUM 100 MG PO CAPS
100.0000 mg | ORAL_CAPSULE | Freq: Two times a day (BID) | ORAL | Status: DC
  Administered 2020-10-11 – 2020-10-12 (×2): 100 mg via ORAL
  Filled 2020-10-11 (×2): qty 1

## 2020-10-11 MED ORDER — PROMETHAZINE HCL 25 MG/ML IJ SOLN: 6.2500 mg | INTRAMUSCULAR | Status: DC | PRN

## 2020-10-11 MED ORDER — PHENOL 1.4 % MT LIQD: 1.0000 | OROMUCOSAL | Status: DC | PRN

## 2020-10-11 MED ORDER — HYDROMORPHONE HCL 1 MG/ML IJ SOLN
2.0000 mg | Freq: Once | INTRAMUSCULAR | Status: AC
  Administered 2020-10-11: 1 mg via INTRAVENOUS
  Filled 2020-10-11: qty 2

## 2020-10-11 MED ORDER — BUPIVACAINE-EPINEPHRINE (PF) 0.25% -1:200000 IJ SOLN
INTRAMUSCULAR | Status: AC
  Filled 2020-10-11: qty 30

## 2020-10-11 MED ORDER — ACETAMINOPHEN 650 MG RE SUPP: 650.0000 mg | RECTAL | Status: DC | PRN

## 2020-10-11 MED ORDER — METHOCARBAMOL 500 MG PO TABS
500.0000 mg | ORAL_TABLET | Freq: Four times a day (QID) | ORAL | Status: DC | PRN
  Administered 2020-10-12 (×2): 500 mg via ORAL
  Filled 2020-10-11 (×2): qty 1

## 2020-10-11 MED ORDER — POLYETHYLENE GLYCOL 3350 17 G PO PACK: 17.0000 g | PACK | Freq: Every day | ORAL | Status: DC | PRN

## 2020-10-11 MED ORDER — SODIUM CHLORIDE 0.9% FLUSH: 3.0000 mL | INTRAVENOUS | Status: DC | PRN

## 2020-10-11 MED ORDER — CEFAZOLIN SODIUM-DEXTROSE 2-4 GM/100ML-% IV SOLN
2.0000 g | INTRAVENOUS | Status: AC
  Administered 2020-10-11: 2 g via INTRAVENOUS
  Filled 2020-10-11: qty 100

## 2020-10-11 MED ORDER — OXYCODONE HCL 5 MG/5ML PO SOLN
5.0000 mg | Freq: Once | ORAL | Status: DC | PRN
Start: 2020-10-11 — End: 2020-10-11

## 2020-10-11 MED ORDER — HALOPERIDOL LACTATE 5 MG/ML IJ SOLN
5.0000 mg | Freq: Four times a day (QID) | INTRAMUSCULAR | Status: DC | PRN
  Administered 2020-10-11: 5 mg via INTRAVENOUS
  Filled 2020-10-11 (×2): qty 1

## 2020-10-11 MED ORDER — HALOPERIDOL 0.5 MG PO TABS
0.5000 mg | ORAL_TABLET | Freq: Every morning | ORAL | Status: DC
  Administered 2020-10-12: 0.5 mg via ORAL
  Filled 2020-10-11: qty 1

## 2020-10-11 MED ORDER — 0.9 % SODIUM CHLORIDE (POUR BTL) OPTIME
TOPICAL | Status: DC | PRN
  Administered 2020-10-11 (×2): 1000 mL

## 2020-10-11 MED ORDER — SODIUM CHLORIDE 0.9% FLUSH
3.0000 mL | Freq: Two times a day (BID) | INTRAVENOUS | Status: DC
  Administered 2020-10-11: 3 mL via INTRAVENOUS

## 2020-10-11 MED ORDER — PROPOFOL 500 MG/50ML IV EMUL
INTRAVENOUS | Status: DC | PRN
  Administered 2020-10-11: 75 ug/kg/min via INTRAVENOUS

## 2020-10-11 MED ORDER — HALOPERIDOL LACTATE 5 MG/ML IJ SOLN
2.0000 mg | Freq: Four times a day (QID) | INTRAMUSCULAR | Status: DC | PRN
  Filled 2020-10-11: qty 0.4

## 2020-10-11 MED ORDER — KETAMINE HCL 10 MG/ML IJ SOLN
INTRAMUSCULAR | Status: DC | PRN
  Administered 2020-10-11 (×2): 10 mg via INTRAVENOUS
  Administered 2020-10-11: 20 mg via INTRAVENOUS
  Administered 2020-10-11: 10 mg via INTRAVENOUS

## 2020-10-11 MED ORDER — ROCURONIUM BROMIDE 10 MG/ML (PF) SYRINGE
PREFILLED_SYRINGE | INTRAVENOUS | Status: DC | PRN
  Administered 2020-10-11: 40 mg via INTRAVENOUS
  Administered 2020-10-11: 60 mg via INTRAVENOUS
  Administered 2020-10-11: 100 mg via INTRAVENOUS

## 2020-10-11 MED ORDER — THROMBIN (RECOMBINANT) 20000 UNITS EX SOLR
CUTANEOUS | Status: AC
  Filled 2020-10-11: qty 20000

## 2020-10-11 MED ORDER — METHOCARBAMOL 1000 MG/10ML IJ SOLN
500.0000 mg | Freq: Four times a day (QID) | INTRAVENOUS | Status: DC | PRN
  Filled 2020-10-11: qty 5

## 2020-10-11 MED ORDER — CEFAZOLIN SODIUM-DEXTROSE 2-4 GM/100ML-% IV SOLN
2.0000 g | Freq: Three times a day (TID) | INTRAVENOUS | Status: AC
  Administered 2020-10-11 (×2): 2 g via INTRAVENOUS
  Filled 2020-10-11 (×2): qty 100

## 2020-10-11 MED ORDER — FOLIC ACID 1 MG PO TABS
1.0000 mg | ORAL_TABLET | Freq: Every day | ORAL | Status: DC
  Administered 2020-10-12: 1 mg via ORAL
  Filled 2020-10-11: qty 1

## 2020-10-11 MED ORDER — ONDANSETRON HCL 4 MG/2ML IJ SOLN
INTRAMUSCULAR | Status: AC
  Filled 2020-10-11: qty 2

## 2020-10-11 MED ORDER — MENTHOL 3 MG MT LOZG: 1.0000 | LOZENGE | OROMUCOSAL | Status: DC | PRN

## 2020-10-11 MED ORDER — GABAPENTIN 300 MG PO CAPS
900.0000 mg | ORAL_CAPSULE | Freq: Three times a day (TID) | ORAL | Status: DC
  Administered 2020-10-11 – 2020-10-12 (×2): 900 mg via ORAL
  Filled 2020-10-11 (×2): qty 3

## 2020-10-11 MED ORDER — FLEET ENEMA 7-19 GM/118ML RE ENEM: 1.0000 | ENEMA | Freq: Once | RECTAL | Status: DC | PRN

## 2020-10-11 MED ORDER — MORPHINE SULFATE (PF) 2 MG/ML IV SOLN
1.0000 mg | INTRAVENOUS | Status: DC | PRN
  Administered 2020-10-11: 2 mg via INTRAVENOUS
  Filled 2020-10-11 (×2): qty 1

## 2020-10-11 MED ORDER — MORPHINE SULFATE (PF) 2 MG/ML IV SOLN
2.0000 mg | Freq: Once | INTRAVENOUS | Status: AC
Start: 2020-10-11 — End: 2020-10-11
  Administered 2020-10-11: 2 mg via INTRAVENOUS

## 2020-10-11 MED ORDER — MIDAZOLAM HCL 2 MG/2ML IJ SOLN
INTRAMUSCULAR | Status: AC
  Filled 2020-10-11: qty 2

## 2020-10-11 MED ORDER — ACETAMINOPHEN 325 MG PO TABS: 650.0000 mg | ORAL_TABLET | ORAL | Status: DC | PRN

## 2020-10-11 MED ORDER — MIDAZOLAM HCL 2 MG/2ML IJ SOLN
INTRAMUSCULAR | Status: DC | PRN
  Administered 2020-10-11: 2 mg via INTRAVENOUS

## 2020-10-11 MED ORDER — OXYCODONE-ACETAMINOPHEN 5-325 MG PO TABS
1.0000 | ORAL_TABLET | ORAL | Status: DC | PRN
  Administered 2020-10-12 (×2): 2 via ORAL
  Filled 2020-10-11 (×2): qty 2

## 2020-10-11 MED ORDER — ACETAMINOPHEN 10 MG/ML IV SOLN: 1000.0000 mg | Freq: Once | INTRAVENOUS | Status: DC | PRN

## 2020-10-11 MED ORDER — OXYCODONE HCL 5 MG PO TABS
5.0000 mg | ORAL_TABLET | Freq: Once | ORAL | Status: AC
Start: 2020-10-11 — End: 2020-10-11
  Administered 2020-10-11: 5 mg via ORAL
  Filled 2020-10-11: qty 1

## 2020-10-11 MED ORDER — BUPIVACAINE LIPOSOME 1.3 % IJ SUSP
INTRAMUSCULAR | Status: DC | PRN
  Administered 2020-10-11: 12.5 mL

## 2020-10-11 MED ORDER — BISACODYL 5 MG PO TBEC: 5.0000 mg | DELAYED_RELEASE_TABLET | Freq: Every day | ORAL | Status: DC | PRN

## 2020-10-11 MED ORDER — ONDANSETRON HCL 4 MG PO TABS: 4.0000 mg | ORAL_TABLET | Freq: Four times a day (QID) | ORAL | Status: DC | PRN

## 2020-10-11 MED ORDER — DEXAMETHASONE SODIUM PHOSPHATE 10 MG/ML IJ SOLN
INTRAMUSCULAR | Status: AC
  Filled 2020-10-11: qty 1

## 2020-10-11 MED ORDER — BUPIVACAINE-EPINEPHRINE 0.25% -1:200000 IJ SOLN
INTRAMUSCULAR | Status: DC | PRN
  Administered 2020-10-11: 8 mL

## 2020-10-11 MED ORDER — HYDROXYZINE HCL 10 MG PO TABS
10.0000 mg | ORAL_TABLET | Freq: Three times a day (TID) | ORAL | Status: DC | PRN
  Filled 2020-10-11: qty 1

## 2020-10-11 MED ORDER — POVIDONE-IODINE 7.5 % EX SOLN: Freq: Once | CUTANEOUS | Status: DC

## 2020-10-11 MED ORDER — FENTANYL CITRATE (PF) 250 MCG/5ML IJ SOLN
INTRAMUSCULAR | Status: DC | PRN
  Administered 2020-10-11 (×2): 50 ug via INTRAVENOUS
  Administered 2020-10-11: 100 ug via INTRAVENOUS
  Administered 2020-10-11 (×6): 50 ug via INTRAVENOUS

## 2020-10-11 MED ORDER — CYCLOBENZAPRINE HCL 5 MG PO TABS
5.0000 mg | ORAL_TABLET | Freq: Every evening | ORAL | Status: DC | PRN
Start: 2020-10-11 — End: 2020-10-12

## 2020-10-11 MED ORDER — ONDANSETRON HCL 4 MG/2ML IJ SOLN
INTRAMUSCULAR | Status: DC | PRN
  Administered 2020-10-11: 4 mg via INTRAVENOUS

## 2020-10-11 MED ORDER — ONDANSETRON HCL 4 MG/2ML IJ SOLN: 4.0000 mg | Freq: Four times a day (QID) | INTRAMUSCULAR | Status: DC | PRN

## 2020-10-11 MED ORDER — SENNOSIDES-DOCUSATE SODIUM 8.6-50 MG PO TABS: 1.0000 | ORAL_TABLET | Freq: Every evening | ORAL | Status: DC | PRN

## 2020-10-11 MED ORDER — FENTANYL CITRATE (PF) 100 MCG/2ML IJ SOLN
25.0000 ug | INTRAMUSCULAR | Status: DC | PRN
  Administered 2020-10-11 (×2): 50 ug via INTRAVENOUS

## 2020-10-11 MED ORDER — CHLORHEXIDINE GLUCONATE 0.12 % MT SOLN
15.0000 mL | Freq: Once | OROMUCOSAL | Status: AC
  Administered 2020-10-11: 15 mL via OROMUCOSAL
  Filled 2020-10-11: qty 15

## 2020-10-11 MED ORDER — SODIUM CHLORIDE 0.9 % IV SOLN: INTRAVENOUS | Status: DC | PRN

## 2020-10-11 MED ORDER — EPHEDRINE SULFATE-NACL 50-0.9 MG/10ML-% IV SOSY
PREFILLED_SYRINGE | INTRAVENOUS | Status: DC | PRN
  Administered 2020-10-11: 10 mg via INTRAVENOUS

## 2020-10-11 MED ORDER — THROMBIN 20000 UNITS EX SOLR
CUTANEOUS | Status: DC | PRN
  Administered 2020-10-11: 20000 [IU] via TOPICAL

## 2020-10-11 MED ORDER — ALBUMIN HUMAN 5 % IV SOLN: INTRAVENOUS | Status: DC | PRN

## 2020-10-11 MED ORDER — ORAL CARE MOUTH RINSE: 15.0000 mL | Freq: Once | OROMUCOSAL | Status: AC

## 2020-10-11 MED ORDER — LACTATED RINGERS IV SOLN: INTRAVENOUS | Status: DC

## 2020-10-11 MED ORDER — PHENYLEPHRINE HCL-NACL 10-0.9 MG/250ML-% IV SOLN
INTRAVENOUS | Status: DC | PRN
  Administered 2020-10-11: 50 ug/min via INTRAVENOUS

## 2020-10-11 MED ORDER — BUPROPION HCL ER (XL) 150 MG PO TB24
150.0000 mg | ORAL_TABLET | Freq: Every morning | ORAL | Status: DC
  Administered 2020-10-12: 150 mg via ORAL
  Filled 2020-10-11: qty 1

## 2020-10-11 SURGICAL SUPPLY — 148 items
ADH SKN CLS APL DERMABOND .7 (GAUZE/BANDAGES/DRESSINGS) ×1
AGENT HMST KT MTR STRL THRMB (HEMOSTASIS)
APL SKNCLS STERI-STRIP NONHPOA (GAUZE/BANDAGES/DRESSINGS) ×1
APPLIER CLIP 11 MED OPEN (CLIP) ×6
APR CLP MED 11 20 MLT OPN (CLIP) ×3
BENZOIN TINCTURE PRP APPL 2/3 (GAUZE/BANDAGES/DRESSINGS) ×2 IMPLANT
BLADE CLIPPER SURG (BLADE) IMPLANT
BLADE SURG 10 STRL SS (BLADE) ×2 IMPLANT
BONE VIVIGEN FORMABLE 10CC (Bone Implant) ×2 IMPLANT
BUR PRESCISION 1.7 ELITE (BURR) ×2 IMPLANT
BUR ROUND FLUTED 5 RND (BURR) ×2 IMPLANT
BUR ROUND PRECISION 4.0 (BURR) IMPLANT
BUR SABER RD CUTTING 3.0 (BURR) IMPLANT
CARTRIDGE OIL MAESTRO DRILL (MISCELLANEOUS) ×1 IMPLANT
CLIP APPLIE 11 MED OPEN (CLIP) ×2 IMPLANT
CLIP LIGATING EXTRA MED SLVR (CLIP) ×2 IMPLANT
CLIP LIGATING EXTRA SM BLUE (MISCELLANEOUS) ×2 IMPLANT
CLSR STERI-STRIP ANTIMIC 1/2X4 (GAUZE/BANDAGES/DRESSINGS) ×1 IMPLANT
CNTNR URN SCR LID CUP LEK RST (MISCELLANEOUS) ×1 IMPLANT
CONT SPEC 4OZ STRL OR WHT (MISCELLANEOUS) ×2
CORD BIPOLAR FORCEPS 12FT (ELECTRODE) ×2 IMPLANT
COVER MAYO STAND STRL (DRAPES) ×4 IMPLANT
COVER SURGICAL LIGHT HANDLE (MISCELLANEOUS) ×6 IMPLANT
COVER WAND RF STERILE (DRAPES) ×6 IMPLANT
DERMABOND ADVANCED (GAUZE/BANDAGES/DRESSINGS) ×1
DERMABOND ADVANCED .7 DNX12 (GAUZE/BANDAGES/DRESSINGS) ×1 IMPLANT
DIFFUSER DRILL AIR PNEUMATIC (MISCELLANEOUS) ×2 IMPLANT
DRAIN CHANNEL 15F RND FF W/TCR (WOUND CARE) IMPLANT
DRAPE C-ARM 42X72 X-RAY (DRAPES) ×6 IMPLANT
DRAPE C-ARMOR (DRAPES) IMPLANT
DRAPE POUCH INSTRU U-SHP 10X18 (DRAPES) ×4 IMPLANT
DRAPE SURG 17X23 STRL (DRAPES) ×14 IMPLANT
DRSG MEPILEX BORDER 4X12 (GAUZE/BANDAGES/DRESSINGS) ×2 IMPLANT
DURAPREP 26ML APPLICATOR (WOUND CARE) ×4 IMPLANT
ELECT BLADE 4.0 EZ CLEAN MEGAD (MISCELLANEOUS) ×8
ELECT CAUTERY BLADE 6.4 (BLADE) ×4 IMPLANT
ELECT REM PT RETURN 9FT ADLT (ELECTROSURGICAL) ×4
ELECTRODE BLDE 4.0 EZ CLN MEGD (MISCELLANEOUS) ×3 IMPLANT
ELECTRODE REM PT RTRN 9FT ADLT (ELECTROSURGICAL) ×2 IMPLANT
EVACUATOR SILICONE 100CC (DRAIN) IMPLANT
FILTER STRAW FLUID ASPIR (MISCELLANEOUS) ×2 IMPLANT
GAUZE 4X4 16PLY RFD (DISPOSABLE) ×2 IMPLANT
GAUZE SPONGE 4X4 12PLY STRL (GAUZE/BANDAGES/DRESSINGS) ×2 IMPLANT
GLOVE BIO SURGEON STRL SZ7 (GLOVE) ×4 IMPLANT
GLOVE BIO SURGEON STRL SZ7.5 (GLOVE) ×2 IMPLANT
GLOVE BIO SURGEON STRL SZ8 (GLOVE) ×4 IMPLANT
GLOVE SRG 8 PF TXTR STRL LF DI (GLOVE) ×4 IMPLANT
GLOVE SS BIOGEL STRL SZ 7.5 (GLOVE) ×1 IMPLANT
GLOVE SUPERSENSE BIOGEL SZ 7.5 (GLOVE) ×1
GLOVE SURG UNDER POLY LF SZ7 (GLOVE) ×4 IMPLANT
GLOVE SURG UNDER POLY LF SZ8 (GLOVE) ×8
GOWN STRL REUS W/ TWL LRG LVL3 (GOWN DISPOSABLE) ×4 IMPLANT
GOWN STRL REUS W/ TWL XL LVL3 (GOWN DISPOSABLE) ×3 IMPLANT
GOWN STRL REUS W/TWL LRG LVL3 (GOWN DISPOSABLE) ×8
GOWN STRL REUS W/TWL XL LVL3 (GOWN DISPOSABLE) ×8
GRAFT BNE MATRIX VG FRMBL L 10 (Bone Implant) IMPLANT
GUIDEWIRE SHARP VIPER II (WIRE) ×4 IMPLANT
HEMOSTAT SNOW SURGICEL 2X4 (HEMOSTASIS) IMPLANT
HEMOSTAT SURGICEL 2X14 (HEMOSTASIS) IMPLANT
INSERT FOGARTY 61MM (MISCELLANEOUS) IMPLANT
INSERT FOGARTY SM (MISCELLANEOUS) IMPLANT
IV CATH 14GX2 1/4 (CATHETERS) ×3 IMPLANT
KIT ALARA NEURO ACCESS (KITS) ×3 IMPLANT
KIT BASIN OR (CUSTOM PROCEDURE TRAY) ×4 IMPLANT
KIT POSITION SURG JACKSON T1 (MISCELLANEOUS) ×2 IMPLANT
KIT TURNOVER KIT B (KITS) ×4 IMPLANT
LOOP VESSEL MAXI BLUE (MISCELLANEOUS) IMPLANT
LOOP VESSEL MINI RED (MISCELLANEOUS) IMPLANT
MARKER SKIN DUAL TIP RULER LAB (MISCELLANEOUS) ×4 IMPLANT
MODULE EMG NDL SSEP NVM5 (NEEDLE) IMPLANT
MODULE EMG NEEDLE SSEP NVM5 (NEEDLE) ×2 IMPLANT
MODULE NVM5 NEXT GEN EMG (NEEDLE) ×1 IMPLANT
NDL 18GX1X1/2 (RX/OR ONLY) (NEEDLE) ×1 IMPLANT
NDL HYPO 25GX1X1/2 BEV (NEEDLE) ×2 IMPLANT
NDL SPNL 18GX3.5 QUINCKE PK (NEEDLE) ×3 IMPLANT
NEEDLE 18GX1X1/2 (RX/OR ONLY) (NEEDLE) ×2 IMPLANT
NEEDLE 22X1 1/2 (OR ONLY) (NEEDLE) ×4 IMPLANT
NEEDLE HYPO 25GX1X1/2 BEV (NEEDLE) ×4 IMPLANT
NEEDLE SPNL 18GX3.5 QUINCKE PK (NEEDLE) ×6 IMPLANT
NS IRRIG 1000ML POUR BTL (IV SOLUTION) ×4 IMPLANT
OIL CARTRIDGE MAESTRO DRILL (MISCELLANEOUS) ×2
PACK LAMINECTOMY ORTHO (CUSTOM PROCEDURE TRAY) ×4 IMPLANT
PACK UNIVERSAL I (CUSTOM PROCEDURE TRAY) ×6 IMPLANT
PAD ARMBOARD 7.5X6 YLW CONV (MISCELLANEOUS) ×12 IMPLANT
PATTIES SURGICAL .5 X1 (DISPOSABLE) ×2 IMPLANT
PATTIES SURGICAL .5X1.5 (GAUZE/BANDAGES/DRESSINGS) ×2 IMPLANT
PENCIL BUTTON HOLSTER BLD 10FT (ELECTRODE) ×2 IMPLANT
PROBE BALL TIP NVM5 SNG USE (BALLOONS) ×2 IMPLANT
ROD VIPER II LORDOSED 5.5X40 (Rod) ×1 IMPLANT
ROD VIPER2 5.5X45 PRE LARDOSED (Rod) ×1 IMPLANT
SCREW SET SINGLE INNER MIS (Screw) ×4 IMPLANT
SCREW XTAB POLY VIPER  7X45 (Screw) ×8 IMPLANT
SCREW XTAB POLY VIPER 7X45 (Screw) IMPLANT
SLEEVE SURGEON STRL (DRAPES) ×1 IMPLANT
SOL ANTI FOG 6CC (MISCELLANEOUS) IMPLANT
SOLUTION ANTI FOG 6CC (MISCELLANEOUS) ×1
SPACER ALIF MAGNIFY 25X31 8D (Spacer) ×1 IMPLANT
SPONGE INTESTINAL PEANUT (DISPOSABLE) ×10 IMPLANT
SPONGE LAP 18X18 RF (DISPOSABLE) IMPLANT
SPONGE LAP 4X18 RFD (DISPOSABLE) IMPLANT
SPONGE SURGIFOAM ABS GEL 100 (HEMOSTASIS) ×6 IMPLANT
STAPLER VISISTAT 35W (STAPLE) IMPLANT
STRIP CLOSURE SKIN 1/2X4 (GAUZE/BANDAGES/DRESSINGS) ×9 IMPLANT
SURGIFLO W/THROMBIN 8M KIT (HEMOSTASIS) IMPLANT
SUT MNCRL AB 3-0 PS2 27 (SUTURE) ×3 IMPLANT
SUT MNCRL AB 4-0 PS2 18 (SUTURE) ×6 IMPLANT
SUT PDS AB 1 CT  36 (SUTURE) ×6
SUT PDS AB 1 CT 36 (SUTURE) IMPLANT
SUT PDS AB 1 CTX 36 (SUTURE) ×6 IMPLANT
SUT PROLENE 4 0 RB 1 (SUTURE) ×4
SUT PROLENE 4-0 RB1 .5 CRCL 36 (SUTURE) IMPLANT
SUT PROLENE 5 0 C 1 24 (SUTURE) ×2 IMPLANT
SUT PROLENE 5 0 CC1 (SUTURE) ×1 IMPLANT
SUT PROLENE 6 0 C 1 30 (SUTURE) ×2 IMPLANT
SUT PROLENE 6 0 CC (SUTURE) IMPLANT
SUT SILK 0 TIES 10X30 (SUTURE) ×2 IMPLANT
SUT SILK 1 TIES 10X30 (SUTURE) ×2 IMPLANT
SUT SILK 2 0 TIES 10X30 (SUTURE) ×6 IMPLANT
SUT SILK 2 0SH CR/8 30 (SUTURE) IMPLANT
SUT SILK 3 0 TIES 10X30 (SUTURE) ×2 IMPLANT
SUT SILK 3 0 TIES 17X18 (SUTURE) ×2
SUT SILK 3 0SH CR/8 30 (SUTURE) IMPLANT
SUT SILK 3-0 18XBRD TIE BLK (SUTURE) ×1 IMPLANT
SUT VIC AB 0 CT1 18XCR BRD 8 (SUTURE) ×1 IMPLANT
SUT VIC AB 0 CT1 8-18 (SUTURE) ×2
SUT VIC AB 1 CT1 18XBRD ANBCTR (SUTURE) IMPLANT
SUT VIC AB 1 CT1 18XCR BRD 8 (SUTURE) ×1 IMPLANT
SUT VIC AB 1 CT1 27 (SUTURE) ×4
SUT VIC AB 1 CT1 27XBRD ANBCTR (SUTURE) ×2 IMPLANT
SUT VIC AB 1 CT1 8-18 (SUTURE) ×6
SUT VIC AB 1 CTX 36 (SUTURE) ×4
SUT VIC AB 1 CTX36XBRD ANBCTR (SUTURE) ×2 IMPLANT
SUT VIC AB 2-0 CT1 18 (SUTURE) ×2 IMPLANT
SUT VIC AB 2-0 CT2 18 VCP726D (SUTURE) ×10 IMPLANT
SUT VIC AB PLUS 45CM 1-MO-4 (SUTURE) ×2 IMPLANT
SYR 20ML LL LF (SYRINGE) ×4 IMPLANT
SYR BULB IRRIG 60ML STRL (SYRINGE) ×4 IMPLANT
SYR CONTROL 10ML LL (SYRINGE) ×5 IMPLANT
SYR TB 1ML LUER SLIP (SYRINGE) ×2 IMPLANT
TAP CANN VIPER2 DL 6.0 (TAP) ×2 IMPLANT
TAPE CLOTH SURG 4X10 WHT LF (GAUZE/BANDAGES/DRESSINGS) ×1 IMPLANT
TOWEL GREEN STERILE (TOWEL DISPOSABLE) ×4 IMPLANT
TOWEL GREEN STERILE FF (TOWEL DISPOSABLE) ×2 IMPLANT
TRAY FOLEY MTR SLVR 16FR STAT (SET/KITS/TRAYS/PACK) ×2 IMPLANT
TRAY FOLEY W/BAG SLVR 16FR (SET/KITS/TRAYS/PACK) ×2
TRAY FOLEY W/BAG SLVR 16FR ST (SET/KITS/TRAYS/PACK) ×1 IMPLANT
WATER STERILE IRR 1000ML POUR (IV SOLUTION) ×4 IMPLANT
YANKAUER SUCT BULB TIP NO VENT (SUCTIONS) ×4 IMPLANT

## 2020-10-11 NOTE — Op Note (Signed)
Date: Oct 11, 2020  Preoperative diagnosis: Chronic lower back pain  Postoperative diagnosis: Same  Procedure: Anterior spine exposure at the L4-L5 disc space for L4-L5 ALIF via anterior retroperitoneal approach  Surgeon: Dr. Cephus Shelling, MD  Co-surgeon: Dr. Estill Bamberg, MD  Indications: Patient is a 65 year old female who was seen with chronic lower back pain and has failed conservative management.  She was evaluated by Dr. Yevette Edwards and recommended an L4-L5 ALIF with additional posterior fusion.  Vascular surgery was asked to assist with abdominal exposure.  She presents today after risk benefits discussed.  Findings: Paramedian incision over the left rectus muscle where the L4-L5 disc space was marked.  Subsequently mobilized the left rectus medially and entered the retroperitoneum and mobilized peritoneum and left ureter to the midline.  Several iliolumbar branches were ligated between 2-0 silk ties and divided and a segment branch was divided between clips.  Ultimately the left iliac vessels were mobilized to the midline including the aorta.  A fixed retractor was placed and confirmed we were at the correct level on lateral fluoroscopy.  Anesthesia: General  Details: Patient was taken to the operating room after informed consent was obtained.  Placed on the operative table in supine position.  General endotracheal anesthesia was induced.  Fluroscopic C-arm was brought in in the lateral position and the L4-L5 disc space was marked over the left rectus muscle.  The abdominal wall was then prepped and draped in usual sterile fashion.  Timeout was performed.  Antibiotics were given.  Ultimately made a longitudinal incision over the left rectus muscle where the L4-L5 disc space was marked.  I then dissected down with Bovie cautery to encountered the anterior rectus sheath that was then opened longitudinally as well.  The left rectus muscle was then mobilized to the midline and the  peritoneum and left ureter were identified in the retroperitoneum and mobilized to the midline.  I did take down some of the posterior rectus sheath above arcuate line.  Ultimately the left iliac artery was identified and the left psoas muscle.  The left iliac artery was mobilized to the midline and Dr. Yevette Edwards used hand-held Wiley retractors to pull the vessels medially.  I did see the left iliac vein that was then mobilized as well and I had to ligate iliolumbar branch between 2-0 silk ties and this was divided.  In the process of mobilizing to the midline we continued to use blunt dissection with a Kd and suction.  Balfour retractor was placed in the wound for added visualization with a wet lap pad cranial.  Ultimately in the process of mobilizing to the midline we did encounter some bleeding on the right lateral side of the disc space.  I suspected this was from a small segmental branch.  I was able to put several clips on this vessel with better vascular control and then we used thrombin Gelfoam for added hemostasis.  Ultimately I then placed fixed Thompson retractor on the field.  Once we brought C-arm in the lateral position we placed a spinal needle in the disc space.  It was apparent that we were at the correct L4-L5 disc space.  The left iliac vein was very scarred into the recess of the L4-L5 anteriorlithesis.  I then had to continue mobilizing the left iliac vein across the midline for more exposure at the L5 body.  This required ligating one additional iliolumbar branches between 2-0 silk ties and dividing them.  I then gently retracted the  left iliac vein with blunt dissection to the midline until I finally had an of working room inferior to the disc base.  We repositioned the retractors with 150 reverse lip on the right side the disc space and a 120 reverse lip on the left side of the disc base and fixed malleable tractors cranial caudal.  Case was turned over to Dr. Yevette Edwards.  I was called back  into the OR at the end of the case after the implant had been placed anteriorly at L4-L5 and retractor removed.  There was some ongoing bleeding from the right side of the disc space above the L4-L5 level.  Using hand held wiley retractors appeared to be a segmental branch that was visualized and this was controlled with vessel clip.  I did place a 5-0 Prolene repair stitch in some venous plexus adjacent to the disc space as well.  Thrombin gelfoam was packed.  This looked much more hemostatic.  The case was turned back over to Dr. Yevette Edwards.  Complication: None  Condition: Stable  Cephus Shelling, MD [facility] Office: [phone]   Cephus Shelling

## 2020-10-11 NOTE — Op Note (Signed)
PATIENT NAME: Jenna Davis   MEDICAL RECORD NO.:   [id number]    DATE OF BIRTH: Nov 28, 1955   DATE OF PROCEDURE: 10/11/2020                               OPERATIVE REPORT    PREOPERATIVE DIAGNOSES: 1. Right-sided lumbar radiculopathy. 2. Severe L4-5 degenerative disc disease and spinal stenosis 3. L4-5 spondylolisthesis   POSTOPERATIVE DIAGNOSES: 1. Right-sided lumbar radiculopathy. 2. Severe L4-5 degenerative disc disease and spinal stenosis 3. L4-5 spondylolisthesis   PROCEDURE: 1. Anterior lumbar interbody fusion, L4-5 (expsure peformed by Dr. Clotilde Dieter) 2. Insertion of interbody device x 1 (Globus Magnify expandable spacer). 3. Posterior spinal fusion, L4-5 4. Placement of posterior instrumentation L4, L5, bilaterally (DePuy) 5. Intraoperative use of fluoroscopy. 6. Use of morselized allograft - Vivigen 7. Co-surgeon with Dr. Clotilde Dieter for retroperitoneal exposure   SURGEON:  Estill Bamberg, MD   ASSISTANT:  Jason Coop, PA-C.   ANESTHESIA:  General endotracheal anesthesia.   COMPLICATIONS:  None.   DISPOSITION:  Stable.   ESTIMATED BLOOD LOSS:  Minimal.   INDICATIONS FOR SURGERY:  Briefly, Jenna Davis is a pleasant 65 year old female, who has been having progressive debilitating pain in the right leg and low back status post a work injury.  Her imaging studies did reveal severe instability and disc degeneration, and severe spinal stenosis at L4-5. The patient did fail appropriate nonoperative measures, but did continue to have significant pain. Given her ongoing pain and dysfunction, we did discuss proceeding with the procedure noted above.  The patient was fully aware of the risks and limitations associated with surgery, and did wish to proceed.      OPERATIVE DETAILS:  On 10/11/2020, the patient was brought to surgery and general endotracheal anesthesia was administered.  The patient was placed supine on the hospital bed.  The patient's abdomen was  prepped and draped in the usual sterile fashion.  An anterior retroperitoneal approach was then performed by Dr. Clotilde Dieter.  I did function as his co-surgeon during the approach.  Once the anterior lumbar spine was noted, we did focus our attention on the L4-5 intervertebral space.  I then performed a thorough and complete L4-L5 intervertebral diskectomy to the level of the posterior longitudinal ligament.  I was very pleased with the diskectomy that I was able to accomplish.  The endplates were then appropriately prepared and the appropriate sized anterior intervertebral spacer was packed with Vivigen and tamped into position. The implant was inserted at 8 mm, and was expanded to approximately 51mm. I was very pleased with the press-fit of the implant. I did liberally use AP and lateral fluoroscopy to ensure that the implants and anterior fixation was in the appropriate position, and was very pleased with the radiographs.  The wound was copiously irrigated.  The fascia was closed using #1 PDS.  The subcutaneous layer was closed using 0 Vicryl followed by 2-0 Vicryl, and the skin was closed using 4-0 Monocryl. Benzoin and Steri-Strips were applied followed by sterile dressing. All instrument counts were correct at the termination of the procedure.   The patient was then rolled prone onto a Jackson spinal bed.  The back was then prepped and draped in the usual sterile fashion.  I then made paramedian incisions on the right and left sides, just lateral to the lateral borders of the pedicles from L4-L5.  On the left side, the posterolateral gutter  and posterior elements were identified and exposed and decorticated.  The remainder of the Vivigen was packed into the posterolateral gutter on the left side to aid in the success of the posterior fusion.  I then tapped the L4 and L5 pedicles bilaterally up to a 6 mm tap.  Of note, I did use neurologic monitoring and I did test each of the taps using  triggered EMG.  There was no tap that tested below 12 milliamps.  I then placed 7 x 45 mm screws bilaterally at L4 and L5.   Rods were then secured into the tulip heads of the screws bilaterally.  Caps were then placed and a final locking procedure was performed.  I was very pleased with the final AP and lateral fluoroscopic images.  The wound was then copiously irrigated.  On the right and left sides, the fascia was closed using #1 Vicryl.  The subcutaneous layer was closed using 0 Vicryl followed by 2-0 Vicryl, and the skin was then closed using 3-0 Monocryl. Benzoin and Steri-Strips were applied followed by sterile dressing.  All instrument counts were correct at the termination of the procedure. I did use neurologic monitoring throughout the surgery, and there was no abnormal EMG activity noted throughout the surgery.    Of note, Jason Coop was my assistant throughout surgery, and did aid in retraction, suctioning, placement of the hardware, and closure for both the anterior and posterior portions of the procedure.       Estill Bamberg, MD

## 2020-10-11 NOTE — Progress Notes (Signed)
Patient was stable at admission to the unit, she was alert and oriented x4. Patient taking to the bathroom, vitals  taking and stable, Patient drinking coffee and sitting up in bed comfortable. Patient called for help after being on the unit for an hour wanting to go to the bathroom again, and while in the bathroom patient started screaming for pain and and getting agitated about having a BM. But no success. After getting patient back in the bed, she still continue to get agitated and combative. Still c/o severe pain . RR nurse at bedside. MD was notified and at bedside .Will continue to monitor

## 2020-10-11 NOTE — Anesthesia Procedure Notes (Signed)
Procedure Name: Intubation Date/Time: 10/11/2020 7:45 AM Performed by: Rosiland Oz, CRNA Pre-anesthesia Checklist: Patient identified, Emergency Drugs available, Suction available, Patient being monitored and Timeout performed Patient Re-evaluated:Patient Re-evaluated prior to induction Oxygen Delivery Method: Circle system utilized Preoxygenation: Pre-oxygenation with 100% oxygen Induction Type: IV induction Ventilation: Mask ventilation without difficulty Laryngoscope Size: Miller and 3 Grade View: Grade I Tube type: Oral Tube size: 7.0 mm Number of attempts: 1 Airway Equipment and Method: Stylet Placement Confirmation: ETT inserted through vocal cords under direct vision,  positive ETCO2 and breath sounds checked- equal and bilateral Secured at: 21 cm Tube secured with: Tape Dental Injury: Teeth and Oropharynx as per pre-operative assessment

## 2020-10-11 NOTE — H&P (Signed)
PREOPERATIVE H&P  Chief Complaint: Right leg pain  HPI: Jenna Davis is a 65 y.o. female who presents with ongoing pain in the right leg  MRI reveals severe stenosis at L4/5 and instability  Patient has failed multiple forms of conservative care and continues to have pain (see office notes for additional details regarding the patient's full course of treatment)  Past Medical History:  Diagnosis Date  . A-fib (HCC)   . Depression   . GERD (gastroesophageal reflux disease)   . RA (rheumatoid arthritis) (HCC)   . Rheumatoid arthritis [facility])    Past Surgical History:  Procedure Laterality Date  . ATRIAL FIBRILLATION ABLATION    . OVARIAN CYST REMOVAL     Social History   Socioeconomic History  . Marital status: Single    Spouse name: Not on file  . Number of children: Not on file  . Years of education: Not on file  . Highest education level: Not on file  Occupational History  . Not on file  Tobacco Use  . Smoking status: Current Every Day Smoker    Packs/day: 0.50  . Smokeless tobacco: Never Used  Vaping Use  . Vaping Use: Never used  Substance and Sexual Activity  . Alcohol use: No  . Drug use: No  . Sexual activity: Not on file  Other Topics Concern  . Not on file  Social History Narrative  . Not on file   Social Determinants of Health   Financial Resource Strain: Not on file  Food Insecurity: Not on file  Transportation Needs: Not on file  Physical Activity: Not on file  Stress: Not on file  Social Connections: Not on file   Family History  Problem Relation Age of Onset  . Diabetes Mother   . Breast cancer Mother   . Diabetes Father    No Known Allergies Prior to Admission medications   Medication Sig Start Date End Date Taking? Authorizing Provider  acetaminophen (TYLENOL) 325 MG tablet Take 2 tablets (650 mg total) by mouth every 6 (six) hours as needed for mild pain (or Fever >/= 101). 01/27/16  Yes Enid Baas, MD  buPROPion  (WELLBUTRIN XL) 150 MG 24 hr tablet Take 150 mg by mouth every morning. 08/25/20  Yes [provider]  Cholecalciferol (D3 PO) Take 1 capsule by mouth daily.   Yes [provider]  cyclobenzaprine (FLEXERIL) 5 MG tablet Take 5-10 mg by mouth at bedtime as needed for pain. 09/06/20  Yes [provider]  dicyclomine (BENTYL) 20 MG tablet Take 20 mg by mouth 4 (four) times daily as needed for other. Stomach cramps 09/11/20  Yes [provider]  folic acid (FOLVITE) 1 MG tablet Take 1 mg by mouth daily. 02/03/15  Yes [provider]  gabapentin (NEURONTIN) 300 MG capsule Take 900 mg by mouth 3 (three) times daily. 04/06/18  Yes [provider]  haloperidol (HALDOL) 0.5 MG tablet Take 0.5 mg by mouth every morning. 09/19/20  Yes [provider]  hydrOXYzine (ATARAX/VISTARIL) 10 MG tablet Take 10 mg by mouth 3 (three) times daily as needed for itching. 09/11/20  Yes [provider]  OTREXUP 25 MG/0.4ML SOAJ Inject 25 mg into the skin once a week. 07/27/20  Yes [provider]  polyethylene glycol (MIRALAX / GLYCOLAX) packet Take 17 g by mouth daily as needed for mild constipation.   Yes [provider]  traMADol (ULTRAM) 50 MG tablet Take 50 mg by mouth every 8 (  eight) hours as needed for pain. 09/29/20  Yes [provider]     All other systems have been reviewed and were otherwise negative with the exception of those mentioned in the HPI and as above.  Physical Exam: Vitals:   10/11/20 0551  BP: (!) 143/83  Pulse: 69  Resp: 18  Temp: 98.1 F (36.7 C)  SpO2: 98%    Body mass index is 17.43 kg/m.  General: Alert, no acute distress Cardiovascular: No pedal edema Respiratory: No cyanosis, no use of accessory musculature Skin: No lesions in the area of chief complaint Neurologic: Sensation intact distally Psychiatric: Patient is competent for consent with normal mood and affect Lymphatic: No axillary  or cervical lymphadenopathy   Assessment/Plan: RIGHT LEG PAIN AND WEAKNESS, MRI NOTABLE FOR SEVERE SPINAL STENOSIS AT LUMBAR 4- LUMBAR 5 AND INSTABILITY  Plan for Procedure(s): ANTERIOR LUMBAR INTERBODY FUSION LUMBAR 4 - LUMBAR 5 WITH POSTERIOR SPINAL FUSION WITH INSTRUMENTATION AND ALLOGRAFT   Jackelyn Hoehn, MD 10/11/2020 6:37 AM

## 2020-10-11 NOTE — Progress Notes (Signed)
I evaluated patient at bedside, where she is noted to be having significant mental status changes. She was previously reportedly sitting with her sister and talking normally, drinking coffee, and now she is extremely combative and is moving about the bed continuously, often arching her back violently and reaching out and pulling on her care providers in her vicinity and is groaning continuously. She is complaining of abdominal pain and back pain, which is expected after her surgery. I have ordered 2mg  of morphine and 5mg  of haldol and and ordered a blood glucose level, and am also in the process of consulting medicine for additional guidance.

## 2020-10-11 NOTE — Significant Event (Signed)
Rapid Response Event Note   Reason for Call :  Pt combative, in sever pain  Initial Focused Assessment:  Patient is writhing in pain, constantly twisting and turning in the bed, arching back and pressing her back onto the side rails.  Extreme agitation and combative.  Complains of the need to have a BM or void.   BP 160/90  Manual  HR 105  RR 28  O2 sat 100% on RA  No evidence of bleeding from either incision.  Dr Yevette Edwards at bedside to assess patient Has received 2mg  Morphine   Interventions:  Additional 2mg  Morphine IV 5mg  Haldol IV,  Patient briefly calm then climbing out of bed, combative and writhing in pain.  Dr at bedside 1mg  Dilaudid given IV  Patient now completely relaxed and sleeping BP 164/87  HR 92  RR 12  O2 sat 100% on RA  Plan of Care:     Event Summary:   MD Notified: Dr Call Time: 1618 Arrival Time: 1620 End Time:  1730  , RN

## 2020-10-11 NOTE — Consult Note (Signed)
Consult Note  Jenna Davis OIN:[id number] DOB: September 15, 1955 DOA: 10/11/2020  Referring MD/NP/PA: Estill Bamberg, MD PCP: Patterson Hammersmith, MD  Chief Complaint/Reason For Consult: Mental status changes  HPI: Jenna Davis is a 65 y.o. female with a history of depression, chronic back pain and AFib s/p ablation who presented for elective operative management of severe L4-5 degenerative disc disease with spinal stenosis and right lumbar radiculopathy with spondylolisthesis. She underwent anterior lumbar fusion, interbody spacer, posterior fusion with assistance from Dr. Chestine Spore for retroperitoneal exposure. This was uncomplicated with plans for routine postoperative care when the patient abruptly developed writhing movement reporting uncontrolled, severe abdominal and back pain. She became increasingly combative not redirectable which did not appreciably improve with morphine 2mg  IV x1. Hospitalists consulted.   Pt's mental status does limit history, though her sister does report the patient being expressive with pain, arching the back, often grimacing expressively, appearing restless at time PTA. The patient has no history of stroke or any reports of focal weakness. No personal or known family history of anesthesia complications. During this episode Vital signs have been largely hypertensive without hypotension. CBG confirmed to be mildly elevated, not low. No bleeding reported by several RNs who have been at the bedside for some time keeping the patient in bed.    Review of Systems: Unable to obtain full ROS due to altered mentation.   Past Medical History:  Diagnosis Date  . A-fib (HCC)   . Depression   . GERD (gastroesophageal reflux disease)   . RA (rheumatoid arthritis) (HCC)   . Rheumatoid arthritis [facility])    Past Surgical History:  Procedure Laterality Date  . ATRIAL FIBRILLATION ABLATION    . OVARIAN CYST REMOVAL     - +Tobacco use history. No mentions of alcohol or illicit substance  use/abuse history.   No Known Allergies Family History  Problem Relation Age of Onset  . Diabetes Mother   . Breast cancer Mother   . Diabetes Father    - Family history otherwise reviewed and not pertinent.  Prior to Admission medications   Medication Sig Start Date End Date Taking? Authorizing Provider  acetaminophen (TYLENOL) 325 MG tablet Take 2 tablets (650 mg total) by mouth every 6 (six) hours as needed for mild pain (or Fever >/= 101). 01/27/16  Yes Enid Baas, MD  buPROPion (WELLBUTRIN XL) 150 MG 24 hr tablet Take 150 mg by mouth every morning. 08/25/20  Yes [provider]  Cholecalciferol (D3 PO) Take 1 capsule by mouth daily.   Yes [provider]  cyclobenzaprine (FLEXERIL) 5 MG tablet Take 5-10 mg by mouth at bedtime as needed for pain. 09/06/20  Yes [provider]  dicyclomine (BENTYL) 20 MG tablet Take 20 mg by mouth 4 (four) times daily as needed for other. Stomach cramps 09/11/20  Yes [provider]  folic acid (FOLVITE) 1 MG tablet Take 1 mg by mouth daily. 02/03/15  Yes [provider]  gabapentin (NEURONTIN) 300 MG capsule Take 900 mg by mouth 3 (three) times daily. 04/06/18  Yes [provider]  haloperidol (HALDOL) 0.5 MG tablet Take 0.5 mg by mouth every morning. 09/19/20  Yes [provider]  hydrOXYzine (ATARAX/VISTARIL) 10 MG tablet Take 10 mg by mouth 3 (three) times daily as needed for itching. 09/11/20  Yes [provider]  OTREXUP 25 MG/0.4ML SOAJ Inject 25 mg into the skin once a week. 07/27/20  Yes [provider]  polyethylene glycol (MIRALAX / GLYCOLAX) packet Take 17  g by mouth daily as needed for mild constipation.   Yes [provider]  traMADol (ULTRAM) 50 MG tablet Take 50 mg by mouth every 8 (eight) hours as needed for pain. 09/29/20  Yes [provider]    Physical Exam: Vitals:   10/11/20 1402 10/11/20 1435 10/11/20 1450 10/11/20 1513  BP: (!) 155/99  (!) 141/97 (!) 160/93 (!) 168/105  Pulse:    78  Resp: 11 19 (!) 25 20  Temp:   97.6 F (36.4 C) (!) 97.5 F (36.4 C)  TempSrc:      SpO2:   99% 100%  Weight:      Height:       Constitutional: 65 y.o. female initially seen writhing in bed requiring 3 staff members to hold extremities for patient and staff safety.  Eyes: Lids and conjunctivae normal, PERRL ENMT: Mucous membranes are moist. Posterior pharynx clear of any exudate or lesions. Fair dentition.  Neck: normal, supple, no masses, no thyromegaly Respiratory: Non-labored breathing room air without accessory muscle use. Clear breath sounds to auscultation bilaterally Cardiovascular: Regular rate and rhythm, no murmurs, rubs, or gallops. No carotid bruits. No JVD. No LE edema. Palpable pedal pulses. Abdomen: Normoactive bowel sounds. Midline abdominal incision dressing c/d/i. Not focally tender, no bruising/settling fluid to palpation or bladder palpated. No guarding or rebound.  GU: No indwelling catheter Musculoskeletal: No clubbing / cyanosis. No joint deformity upper and lower extremities. Good ROM, no contractures. Normal muscle tone.  Skin: Warm, dry. Lumbar posterior incision dressing c/d/i as well. No significant lesions noted.  Neurologic: CN II-XII grossly intact. Speech normal. Moves all extremities without restriction, full strength.  Psychiatric: Alert and restless.   Labs on Admission: I have personally reviewed following labs and imaging studies  CBC: Recent Labs  Lab 10/09/20 1037  WBC 5.4  NEUTROABS 2.8  HGB 13.4  HCT 40.7  MCV 103.0*  PLT 349   Basic Metabolic Panel: Recent Labs  Lab 10/09/20 1037  NA 139  K 3.5  CL 108  CO2 23  GLUCOSE 55*  BUN 31*  CREATININE 1.26*  CALCIUM 9.7   GFR: Estimated Creatinine Clearance: 35.4 mL/min (A) (by C-G formula based on SCr of 1.26 mg/dL (H)). Liver Function Tests: Recent Labs  Lab 10/09/20 1037  AST 22  ALT 17  ALKPHOS 62  BILITOT 0.5  PROT 7.8   ALBUMIN 4.2   No results for input(s): LIPASE, AMYLASE in the last 168 hours. No results for input(s): AMMONIA in the last 168 hours. Coagulation Profile: Recent Labs  Lab 10/09/20 1037  INR 1.0   Cardiac Enzymes: No results for input(s): CKTOTAL, CKMB, CKMBINDEX, TROPONINI in the last 168 hours. BNP (last 3 results) No results for input(s): PROBNP in the last 8760 hours. HbA1C: No results for input(s): HGBA1C in the last 72 hours. CBG: No results for input(s): GLUCAP in the last 168 hours. Lipid Profile: No results for input(s): CHOL, HDL, LDLCALC, TRIG, CHOLHDL, LDLDIRECT in the last 72 hours. Thyroid Function Tests: No results for input(s): TSH, T4TOTAL, FREET4, T3FREE, THYROIDAB in the last 72 hours. Anemia Panel: No results for input(s): VITAMINB12, FOLATE, FERRITIN, TIBC, IRON, RETICCTPCT in the last 72 hours. Urine analysis:    Component Value Date/Time   COLORURINE YELLOW 10/09/2020 1037   APPEARANCEUR HAZY (A) 10/09/2020 1037   LABSPEC 1.032 (H) 10/09/2020 1037   PHURINE 5.0 10/09/2020 1037   GLUCOSEU NEGATIVE 10/09/2020 1037   HGBUR NEGATIVE 10/09/2020 1037   BILIRUBINUR NEGATIVE 10/09/2020  1037   KETONESUR NEGATIVE 10/09/2020 1037   PROTEINUR NEGATIVE 10/09/2020 1037   NITRITE NEGATIVE 10/09/2020 1037   LEUKOCYTESUR TRACE (A) 10/09/2020 1037    Recent Results (from the past 240 hour(s))  SARS CORONAVIRUS 2 (TAT 6-24 HRS) Nasopharyngeal Nasopharyngeal Swab     Status: None   Collection Time: 10/09/20 10:26 AM   Specimen: Nasopharyngeal Swab  Result Value Ref Range Status   SARS Coronavirus 2 NEGATIVE NEGATIVE Final    Comment: (NOTE) SARS-CoV-2 target nucleic acids are NOT DETECTED.  The SARS-CoV-2 RNA is generally detectable in upper and lower respiratory specimens during the acute phase of infection. Negative results do not preclude SARS-CoV-2 infection, do not rule out co-infections with other pathogens, and should not be used as the sole basis for  treatment or other patient management decisions. Negative results must be combined with clinical observations, patient history, and epidemiological information. The expected result is Negative.  Fact Sheet for Patients: HairSlick.no  Fact Sheet for Healthcare Providers: quierodirigir.com  This test is not yet approved or cleared by the [place] FDA and  has been authorized for detection and/or diagnosis of SARS-CoV-2 by FDA under an Emergency Use Authorization (EUA). This EUA will remain  in effect (meaning this test can be used) for the duration of the COVID-19 declaration under Se ction 564(b)(1) of the Act, 21 U.S.C. section 360bbb-3(b)(1), unless the authorization is terminated or revoked sooner.  Performed at [facility], [address], [place], [place] [address]   Surgical pcr screen     Status: None   Collection Time: 10/09/20 10:37 AM   Specimen: Nasal Mucosa; Nasal Swab  Result Value Ref Range Status   MRSA, PCR NEGATIVE NEGATIVE Final   Staphylococcus aureus NEGATIVE NEGATIVE Final    Comment: (NOTE) The Xpert SA Assay (FDA approved for NASAL specimens in patients 44 years of age and older), is one component of a comprehensive surveillance program. It is not intended to diagnose infection nor to guide or monitor treatment. Performed at [facility], [address] [place], [place] [address]      Radiological Exams on Admission: DG Lumbar Spine 2-3 Views  Result Date: 10/11/2020 CLINICAL DATA:  Provided history: Surgery, elective. Additional history provided: L4-5 ALIF and posterior hardware. Provided fluoroscopy time 2 minutes, 28 seconds (20.68 mGy). EXAM: LUMBAR SPINE - 2-3 VIEW; DG C-ARM 1-60 MIN COMPARISON:  CT of the lumbar spine 08/14/2020. FINDINGS: PA and lateral view intraoperative fluoroscopic images of the lumbar spine are submitted, 4 images total. The lowest well-formed  intervertebral disc space is designated L5-S1. On the most recent provided images, an interbody device is present at L4-L5. A posterior spinal fusion construct is also present at L4-L5 (bilateral pedicle screws and vertical interconnecting rods). Persistent although improved L4-L5 grade 1 anterolisthesis. IMPRESSION: Four intraoperative fluoroscopic images of the lumbar spine from L4-L5 fusion, as described. Electronically Signed   By: Jackey Loge DO   On: 10/11/2020 13:04   DG C-Arm 1-60 Min  Result Date: 10/11/2020 CLINICAL DATA:  Provided history: Surgery, elective. Additional history provided: L4-5 ALIF and posterior hardware. Provided fluoroscopy time 2 minutes, 28 seconds (20.68 mGy). EXAM: LUMBAR SPINE - 2-3 VIEW; DG C-ARM 1-60 MIN COMPARISON:  CT of the lumbar spine 08/14/2020. FINDINGS: PA and lateral view intraoperative fluoroscopic images of the lumbar spine are submitted, 4 images total. The lowest well-formed intervertebral disc space is designated L5-S1. On the most recent provided images, an interbody device is present  at L4-L5. A posterior spinal fusion construct is also present at L4-L5 (bilateral pedicle screws and vertical interconnecting rods). Persistent although improved L4-L5 grade 1 anterolisthesis. IMPRESSION: Four intraoperative fluoroscopic images of the lumbar spine from L4-L5 fusion, as described. Electronically Signed   By: Jackey Loge DO   On: 10/11/2020 13:04   DG OR LOCAL ABDOMEN  Result Date: 10/11/2020 CLINICAL DATA:  Postop ALIF.  Instrument count. EXAM: OR LOCAL ABDOMEN COMPARISON:  Lumbar CT 08/14/2020 FINDINGS: Metal fusion device overlies the L4-5 disc space presumably from an anterior approach. There are surgical clips in the surrounding soft tissues. No retained surgical instrument. IMPRESSION: Anterior fusion L4-5. Surgical clips in the surrounding soft tissues. No retained instrument. These results were called by telephone at the time of interpretation on  10/11/2020 at 11:43 am to provider Rosanna, who verbally acknowledged these results. Electronically Signed   By: Marlan Palau M.D.   On: 10/11/2020 11:44   Assessment/Plan Active Problems:   Radiculopathy   Right-sided lumbar radiculopathy, severe L4-5 degenerative disc disease and spinal stenosis, L4-5 spondylolisthesis: s/p operative management per orthopedics/VVS on 5/25:  - Augment postoperative pain control as pt most directly responded to dilaudid  IV this afternoon. Odd that morphine did not appreciably help as she does not appear to be terribly opioid tolerant based on PTA meds (tramadol only) - Other postoperative care per orthopedics - Suspicion for occult bleed/immediate postoperative complication is currently low with benign exam and stable vital signs.   Acute encephalopathy: No focal deficits or seizure-like movements noted. Occurring in the postoperative period makes anesthesia, pain control most likely precipitants. Does not appear to be withdrawing from toxic substance, no report of receiving toxic substance after surgery. CBG ok. Not consistent with metabolic/lethargic encephalopathy.  - Bowel regimen important. Had foley in as recently as a couple hours ago, no palpable bladder to suggest urinary retention as etiology at this time. Would monitor I/O closely to confirm this does not arise.  - If not improved on 5/26, consider further work up.  - Continue dilaudid 0.5 -  IV q3h prn moderate - severe pain. Monitor closely overnight, may apply restraints as needed.  - Continue home medications including haldol, will also make lower dose (  IV) available as needed for agitation.  - Delirium precautions.  Tyrone Nine, MD [facility] [email or web address] 10/11/2020, 5:05 PM
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
## 2020-10-11 NOTE — Transfer of Care (Signed)
Immediate Anesthesia Transfer of Care Note  Patient: Jenna Davis  Procedure(s) Performed: ANTERIOR LUMBAR INTERBODY FUSION LUMBAR 4 - LUMBAR 5 WITH POSTERIOR SPINAL FUSION WITH INSTRUMENTATION AND ALLOGRAFT (N/A ) POSTERIOR LUMBAR FUSION 1 LEVEL (N/A ) ABDOMINAL EXPOSURE (N/A )  Patient Location: PACU  Anesthesia Type:General  Level of Consciousness: drowsy and patient cooperative  Airway & Oxygen Therapy: Patient Spontanous Breathing  Post-op Assessment: Report given to RN and Post -op Vital signs reviewed and stable  Post vital signs: Reviewed and stable  Last Vitals:  Vitals Value Taken Time  BP 163/92 10/11/20 1303  Temp    Pulse 124 10/11/20 1304  Resp 14 10/11/20 1306  SpO2 97 % 10/11/20 1304  Vitals shown include unvalidated device data.  Last Pain:  Vitals:   10/11/20 0657  TempSrc:   PainSc: 10-Worst pain ever      Patients Stated Pain Goal: 3 (10/11/20 0657)  Complications: No complications documented.

## 2020-10-12 DIAGNOSIS — M5410 Radiculopathy, site unspecified: Secondary | ICD-10-CM | POA: Diagnosis not present

## 2020-10-12 MED ORDER — METHOCARBAMOL 500 MG PO TABS: 500.0000 mg | ORAL_TABLET | Freq: Four times a day (QID) | ORAL | 1 refills | Status: DC | PRN

## 2020-10-12 MED ORDER — OXYCODONE-ACETAMINOPHEN 5-325 MG PO TABS: 1.0000 | ORAL_TABLET | ORAL | 0 refills | Status: DC | PRN

## 2020-10-12 MED FILL — Thrombin (Recombinant) For Soln 20000 Unit: CUTANEOUS | Qty: 1 | Status: AC

## 2020-10-12 NOTE — Plan of Care (Signed)
Adequately Ready For Discharge 

## 2020-10-12 NOTE — Evaluation (Signed)
Physical Therapy Evaluation Patient Details Name: Jenna Davis MRN: [id number] DOB: 03/24/1956 Today's Date: 10/12/2020   History of Present Illness  Pt is a 65 y/o female presenting s/p L4-5 ALIF on 10/11/2020. PMH significant for A-fib s/p ablation, RA.  Clinical Impression  Pt admitted with above diagnosis. At the time of PT eval, pt was able to demonstrate transfers and ambulation with up to mod assist without an AD, and min guard assist with a RW for support. Pt reports increased pain by end of session and was not able to tolerate sitting up in the chair. Pt was returned to bed at end of session and RN was notified that pt was requesting pain meds. Pt was educated on precautions, brace application/wearing schedule, appropriate activity progression, and car transfer. Pt currently with functional limitations due to the deficits listed below (see PT Problem List). Pt will benefit from skilled PT to increase their independence and safety with mobility to allow discharge to the venue listed below.     Follow Up Recommendations Home health PT;Supervision for mobility/OOB    Equipment Recommendations  Rolling walker with 5" wheels;3in1 (PT)    Recommendations for Other Services       Precautions / Restrictions Precautions Precautions: Back Precaution Booklet Issued: Yes (comment) Precaution Comments: Pt was cued for precautions throughout functional mobility. Required Braces or Orthoses: Spinal Brace Spinal Brace: Thoracolumbosacral orthotic;Applied in sitting position Restrictions Weight Bearing Restrictions: No      Mobility  Bed Mobility Overal bed mobility: Needs Assistance Bed Mobility: Rolling;Sit to Sidelying Rolling: Modified independent (Device/Increase time) Sidelying to sit: Supervision     Sit to sidelying: Supervision General bed mobility comments: VC's for proper log roll technique. HOB flat and use of rails required.    Transfers Overall transfer level: Needs  assistance Equipment used: 1 person hand held assist Transfers: Sit to/from Stand Sit to Stand: Min guard         General transfer comment: Hands on guarding provided for safety. Increased time to gain/maintain standing balance without assist.  Ambulation/Gait Ambulation/Gait assistance: Mod assist;Min guard Gait Distance (Feet): 150 Feet Assistive device: Rolling walker (2 wheeled);None Gait Pattern/deviations: Step-through pattern;Decreased stride length;Drifts right/left;Trunk flexed Gait velocity: Decreased Gait velocity interpretation: <1.31 ft/sec, indicative of household ambulator General Gait Details: Initially without AD and requiring up to mod assist due to unsteadiness and LOB to the R. With RW, progressed to min guard assist.  Stairs Stairs: Yes Stairs assistance: Min guard Stair Management: Two rails;Step to pattern;Forwards Number of Stairs: 4 General stair comments: VC's for sequencing and general safety. No assist required however close, hands on guarding provided.  Wheelchair Mobility    Modified Rankin (Stroke Patients Only)       Balance Overall balance assessment: Mild deficits observed, not formally tested                                           Pertinent Vitals/Pain Pain Assessment: Faces Pain Score: 10-Worst pain ever Faces Pain Scale: Hurts even more Pain Location: Low back (incisional) Pain Descriptors / Indicators: Aching;Sore;Operative site guarding Pain Intervention(s): Limited activity within patient's tolerance;Monitored during session;Repositioned    Home Living Family/patient expects to be discharged to:: Private residence Living Arrangements: Other relatives (Grandson (works 3rd shift)) Available Help at Discharge: Family;Available 24 hours/day (Sister to stay when grandson is at work.) Type of Home: Mobile home Home Access:  Stairs to enter Entrance Stairs-Rails: Right;Left;Can reach both Entrance Stairs-Number  of Steps: 3 Home Layout: One level Home Equipment: Cane - quad;Shower seat - built in      Prior Function Level of Independence: Independent         Comments: Working prior to [employer] Dominance   Dominant Hand: Right    Extremity/Trunk Assessment   Upper Extremity Assessment Upper Extremity Assessment: Generalized weakness    Lower Extremity Assessment Lower Extremity Assessment: Generalized weakness    Cervical / Trunk Assessment Cervical / Trunk Assessment: Other exceptions Cervical / Trunk Exceptions: s/p lumbar surgery  Communication   Communication: No difficulties  Cognition Arousal/Alertness: Awake/alert Behavior During Therapy: WFL for tasks assessed/performed Overall Cognitive Status: Within Functional Limits for tasks assessed                                        General Comments      Exercises     Assessment/Plan    PT Assessment Patient needs continued PT services  PT Problem List Decreased strength;Decreased activity tolerance;Decreased balance;Decreased mobility;Decreased knowledge of use of DME;Decreased safety awareness;Decreased knowledge of precautions;Pain       PT Treatment Interventions DME instruction;Gait training;Stair training;Functional mobility training;Therapeutic activities;Therapeutic exercise;Neuromuscular re-education;Patient/family education;Balance training    PT Goals (Current goals can be found in the Care Plan section)  Acute Rehab PT Goals Patient Stated Goal: To return home. PT Goal Formulation: With patient Time For Goal Achievement: 10/19/20 Potential to Achieve Goals: Good    Frequency Min 5X/week   Barriers to discharge        Co-evaluation               AM-PAC PT "6 Clicks" Mobility  Outcome Measure Help needed turning from your back to your side while in a flat bed without using bedrails?: None Help needed moving from lying on your back to sitting on the side of a flat bed  without using bedrails?: A Little Help needed moving to and from a bed to a chair (including a wheelchair)?: A Little Help needed standing up from a chair using your arms (e.g., wheelchair or bedside chair)?: A Little Help needed to walk in hospital room?: A Little Help needed climbing 3-5 steps with a railing? : A Little 6 Click Score: 19    End of Session Equipment Utilized During Treatment: Gait belt;Back brace Activity Tolerance: Patient limited by pain Patient left: in bed;with call bell/phone within reach Nurse Communication: Mobility status;Patient requests pain meds PT Visit Diagnosis: Unsteadiness on feet (R26.81);Pain Pain - part of body:  (incision site/abdomen)    Time: 7106-2694 PT Time Calculation (min) (ACUTE ONLY): 21 min   Charges:   PT Evaluation $PT Eval Low Complexity: 1 Low          Conni Slipper, PT, DPT Acute Rehabilitation Services Pager: [phone] Office: [phone]   Marylynn Pearson 10/12/2020, 8:46 AM

## 2020-10-12 NOTE — Progress Notes (Addendum)
  Progress Note    10/12/2020 6:57 AM 1 Day Post-Op  Subjective:  Pt up with NT walking to the bathroom.  Says she feels better and can now feel her feet.   Tm 99.1  Vitals:   10/11/20 2039 10/12/20 0534  BP: (!) 120/92 108/74  Pulse: 99 96  Resp: 18 18  Temp: 98.9 F (37.2 C) 99.1 F (37.3 C)  SpO2: 97% 96%    Physical Exam: General:  No distress Lungs:  Non labored   CBC    Component Value Date/Time   WBC 5.4 10/09/2020 1037   RBC 3.95 10/09/2020 1037   HGB 13.4 10/09/2020 1037   HGB 14.8 01/09/2014 0033   HCT 40.7 10/09/2020 1037   HCT 44.1 01/09/2014 0033   PLT 349 10/09/2020 1037   PLT 299 01/09/2014 0033   MCV 103.0 (H) 10/09/2020 1037   MCV 91 01/09/2014 0033   MCH 33.9 10/09/2020 1037   MCHC 32.9 10/09/2020 1037   RDW 14.7 10/09/2020 1037   RDW 13.7 01/09/2014 0033   LYMPHSABS 2.0 10/09/2020 1037   MONOABS 0.5 10/09/2020 1037   EOSABS 0.1 10/09/2020 1037   BASOSABS 0.1 10/09/2020 1037    BMET    Component Value Date/Time   NA 139 10/09/2020 1037   NA 139 01/09/2014 0033   K 3.5 10/09/2020 1037   K 4.1 01/09/2014 0033   CL 108 10/09/2020 1037   CL 108 (H) 01/09/2014 0033   CO2 23 10/09/2020 1037   CO2 23 01/09/2014 0033   GLUCOSE 55 (L) 10/09/2020 1037   GLUCOSE 104 (H) 01/09/2014 0033   BUN 31 (H) 10/09/2020 1037   BUN 23 (H) 01/09/2014 0033   CREATININE 1.26 (H) 10/09/2020 1037   CREATININE 1.05 01/09/2014 0033   CALCIUM 9.7 10/09/2020 1037   CALCIUM 9.0 01/09/2014 0033   GFRNONAA 48 (L) 10/09/2020 1037   GFRNONAA 59 (L) 01/09/2014 0033   GFRAA 59 (L) 05/23/2018 0603   GFRAA >60 01/09/2014 0033    INR    Component Value Date/Time   INR 1.0 10/09/2020 1037     Intake/Output Summary (Last 24 hours) at 10/12/2020 0657 Last data filed at 10/12/2020 0500 Gross per 24 hour  Intake 3390 ml  Output 600 ml  Net 2790 ml     Assessment/Plan:  65 y.o. female is s/p:  Anterior spine exposure at the L4-L5 disc space for L4-L5 ALIF  via anterior retroperitoneal approach  1 Day Post-Op   -pt doing well this morning with improvement of numbness in her feet -pt will f/u with vascular as needed   Doreatha Massed, PA-C [facility] [phone] 10/12/2020 6:57 AM  I have seen and evaluated the patient. I agree with the PA note as documented above.  Postop day 1 status post L4-L5 ALIF.  She has incisional pain over her left paramedian incision but otherwise no significant abdominal away from the incision.  She has no significant pain or guarding.  Both of her femoral pulses and both pedal pulses are palpable.  She tolerated breakfast. Medicine was consulted last night for acute mental status changes that is hopefully improving this morning.  Hemodynamics stable overnight.  Looks ok for discharge if pain controlled.  Cephus Shelling, MD [facility] [place] Office: [phone]

## 2020-10-12 NOTE — Progress Notes (Signed)
    Patient substantially improved this morning She has minimal back pain, and states that her legs and feet are substantially improved She has been ambulating, and feels that she is walking more upright The patient has no recollection of her screaming and flailing about the bed that occurred last night   Physical Exam: Vitals:   10/12/20 0534 10/12/20 0717  BP: 108/74 117/75  Pulse: 96 99  Resp: 18 18  Temp: 99.1 F (37.3 C) 99.8 F (37.7 C)  SpO2: 96% 99%   Patient is sitting in recliner, and appears very comfortable, eating breakfast Dressing in place NVI  POD #1 s/p L4-5 anterior/posterior fusion, doing very well.  The patient's mental status has dramatically improved.  - up with PT/OT, encourage ambulation - Percocet for pain, Robaxin for muscle spasms - d/c home today with f/u in 2 weeks

## 2020-10-12 NOTE — Progress Notes (Signed)
PROGRESS NOTE  Brief Narrative: Jenna Davis is a 65 y.o. female with a history of AFib/flutter s/p ablation in NSR since, depression, and chronic back pain who underwent spinal fusion for spinal stenosis with radiculopathy on 5/25. Surgery, itself, was uneventful, though postoperative course complicated briefly by severe uncontrolled pain resulting in mental status changes for which medicine was consulted. This has dramatically improved.   Subjective: Patient has worked with PT and OT this AM, seen by vascular surgery and primary service with plans to DC home. Pain is well-controlled.   Objective: BP 117/75 (BP Location: Right Arm)   Pulse 99   Temp 99.8 F (37.7 C) (Oral)   Resp 18   Ht 5\' 6"  (1.676 m)   Wt 49 kg   SpO2 99%   BMI 17.43 kg/m   Gen: No distress, resting quietly Pulm: Clear and nonlabored  CV: RRR, no murmur, no JVD, no edema GI: Soft, not tender, ND, +BS  Neuro: Alert and oriented. Motor and sensory function improved/intact BL LE's Skin: Abdominal and spinal incision sistes with dressings that are c/d/i appropriately only mildly tender.   Assessment & Plan: Pain control significantly improved from immediately postoperative, now controlled enough to facilitate mobility and stable for discharge per primary service. I see plans for pain management that appear appropriate. Please reach out to the hospitalist service if we can be of any assistance for this patient.   , MD Pager on amion 10/12/2020, 9:11 AM

## 2020-10-12 NOTE — Progress Notes (Signed)
Patient alert and oriented, voiding adequately, skin clean, dry and intact without evidence of skin break down, or symptoms of complications - no redness or edema noted, only slight tenderness at site.  Patient states pain is manageable at time of discharge. Patient has an appointment with MD in 2 weeks. Patient and sister stated understanding of instructions given and also DME's given for home use.

## 2020-10-12 NOTE — Evaluation (Signed)
Occupational Therapy Evaluation Patient Details Name: Jenna Davis MRN: [id number] DOB: May 09, 1956 Today's Date: 10/12/2020    History of Present Illness 65 y.o. female presenting s/p L4-5 ALIF by Dr. Chestine Spore. PMHx significant for A-fib, RA, and GERD.   Clinical Impression   PTA patient was living with her grandson in a mobile home with 3 STE and was grossly I with ADLs/IADLs without AD. Patient currently functioning slightly below baseline demonstrating observed ADLs including grooming, UB/LB dressing and ADL transfers with supervision A for safety. OT provided education on spinal precautions, home set-up to maximize safety and independence with self-care tasks, and acquisition/use of AE. Patient expressed verbal understanding. Patient would benefit from continued acute OT services in prep for safe return home. No follow-up OT needs at this time.     Follow Up Recommendations  No OT follow up;Supervision/Assistance - 24 hour (initially)    Equipment Recommendations  3 in 1 bedside commode    Recommendations for Other Services       Precautions / Restrictions Precautions Precautions: Back Precaution Booklet Issued: Yes (comment) Precaution Comments: Patient able to recall 3/3 back preacutions after education. Good recall of precautions during functional activities. Required Braces or Orthoses: Spinal Brace Spinal Brace: Thoracolumbosacral orthotic Restrictions Weight Bearing Restrictions: No      Mobility Bed Mobility Overal bed mobility: Needs Assistance Bed Mobility: Rolling;Sidelying to Sit Rolling: Modified independent (Device/Increase time) Sidelying to sit: Supervision       General bed mobility comments: +bed rail. Supervision A for safety.    Transfers Overall transfer level: Needs assistance Equipment used: 1 person hand held assist Transfers: Sit to/from Stand Sit to Stand: Supervision;Min guard         General transfer comment: Min guard initially for  steadying progressing to supervision A .    Balance Overall balance assessment: Mild deficits observed, not formally tested                                         ADL either performed or assessed with clinical judgement   ADL Overall ADL's : Needs assistance/impaired     Grooming: Supervision/safety;Standing Grooming Details (indicate cue type and reason): Supervision A for safety.         Upper Body Dressing : Set up;Sitting   Lower Body Dressing: Supervision/safety;Sit to/from stand Lower Body Dressing Details (indicate cue type and reason): Supervision A for safety. Toilet Transfer: Radiographer, therapeutic Details (indicate cue type and reason): Simulated with transfer to recliner. Supervision A for safety.         Functional mobility during ADLs: Supervision/safety       Vision Baseline Vision/History: Wears glasses Wears Glasses: At all times Patient Visual Report: No change from baseline       Perception     Praxis      Pertinent Vitals/Pain Pain Assessment: 0-10 Pain Score: 7  Pain Location: Low back (incisional) Pain Descriptors / Indicators: Aching;Sore Pain Intervention(s): Limited activity within patient's tolerance;Monitored during session;Premedicated before session;Repositioned     Hand Dominance Right   Extremity/Trunk Assessment Upper Extremity Assessment Upper Extremity Assessment: Generalized weakness   Lower Extremity Assessment Lower Extremity Assessment: Defer to PT evaluation   Cervical / Trunk Assessment Cervical / Trunk Assessment: Other exceptions Cervical / Trunk Exceptions: s/p lumbar surgery   Communication Communication Communication: No difficulties   Cognition Arousal/Alertness: Awake/alert Behavior During Therapy: WFL for tasks assessed/performed  Overall Cognitive Status: Within Functional Limits for tasks assessed                                     General Comments        Exercises     Shoulder Instructions      Home Living Family/patient expects to be discharged to:: Private residence Living Arrangements: Other relatives (Grandson (works 3rd shift)) Available Help at Discharge: Family;Available 24 hours/day (Sister to stay when grandson is at work.) Type of Home: Mobile home Home Access: Stairs to enter Secretary/administrator of Steps: 3 Entrance Stairs-Rails: Right;Left;Can reach both Home Layout: One level     Bathroom Shower/Tub: Producer, television/film/video: Standard     Home Equipment: Cane - quad;Shower seat - built in          Prior Functioning/Environment Level of Independence: Independent        Comments: Working prior to [employer]        OT Problem List: Decreased strength;Impaired balance (sitting and/or standing);Decreased knowledge of use of DME or AE      OT Treatment/Interventions: Self-care/ADL training;Therapeutic exercise;Energy conservation;DME and/or AE instruction;Therapeutic activities;Patient/family education;Balance training    OT Goals(Current goals can be found in the care plan section) Acute Rehab OT Goals Patient Stated Goal: To return home. OT Goal Formulation: With patient Time For Goal Achievement: 10/26/20 Potential to Achieve Goals: Good ADL Goals Additional ADL Goal #1: Patient will recall 3/3 back precautions in prep for ADLs/IADLs. Additional ADL Goal #2: Patient will don TLSO brace independently in prep for safe return home.  OT Frequency: Min 2X/week   Barriers to D/C:            Co-evaluation              AM-PAC OT "6 Clicks" Daily Activity     Outcome Measure Help from another person eating meals?: None Help from another person taking care of personal grooming?: A Little Help from another person toileting, which includes using toliet, bedpan, or urinal?: A Little Help from another person bathing (including washing, rinsing, drying)?: A Little Help from another person to  put on and taking off regular upper body clothing?: A Little Help from another person to put on and taking off regular lower body clothing?: A Little 6 Click Score: 19   End of Session Equipment Utilized During Treatment: Back brace Nurse Communication: Mobility status  Activity Tolerance: Patient tolerated treatment well Patient left: in chair;with call bell/phone within reach  OT Visit Diagnosis: Unsteadiness on feet (R26.81);Muscle weakness (generalized) (M62.81)                Time: 7494-4967 OT Time Calculation (min): 19 min Charges:  OT General Charges $OT Visit: 1 Visit OT Evaluation $OT Eval Low Complexity: 1 Low  Caelen Reierson H. OTR/L Supplemental OT, Department of rehab services [phone]  Demontez Novack R H. 10/12/2020, 8:00 AM

## 2020-10-12 NOTE — Anesthesia Postprocedure Evaluation (Signed)
Anesthesia Post Note  Patient: Jenna Davis  Procedure(s) Performed: ANTERIOR LUMBAR INTERBODY FUSION LUMBAR 4 - LUMBAR 5 WITH POSTERIOR SPINAL FUSION WITH INSTRUMENTATION AND ALLOGRAFT (N/A ) POSTERIOR LUMBAR FUSION 1 LEVEL (N/A ) ABDOMINAL EXPOSURE (N/A )     Patient location during evaluation: PACU Anesthesia Type: General Level of consciousness: awake and alert Pain management: pain level controlled Vital Signs Assessment: post-procedure vital signs reviewed and stable Respiratory status: spontaneous breathing, nonlabored ventilation, respiratory function stable and patient connected to nasal cannula oxygen Cardiovascular status: blood pressure returned to baseline and stable Postop Assessment: no apparent nausea or vomiting Anesthetic complications: no   No complications documented.  Last Vitals:  Vitals:   10/12/20 0534 10/12/20 0717  BP: 108/74 117/75  Pulse: 96 99  Resp: 18 18  Temp: 37.3 C 37.7 C  SpO2: 96% 99%    Last Pain:  Vitals:   10/12/20 0717  TempSrc: Oral  PainSc:                  [name]

## 2020-10-12 NOTE — TOC Initial Note (Addendum)
Transition of Care [facility]) - Initial/Assessment Note    Patient Details  Name: Jenna Davis MRN: [id number] Date of Birth: 1956/02/03  Transition of Care [facility]) CM/SW Contact:    Jenna Chars, LCSW Phone Number: 10/12/2020, 9:54 AM  Clinical Narrative:   CSW met with pt regarding recommendation for [facility].  Permission given to speak with pt sisters Jenna Davis and Jenna Davis.  Pt lives alone but reports her sister and grandson are going to be staying with her at [place].  Pt agreeable to [facility], aware that this will need to be set up by workers comp.  Her case Freight forwarder for workers comp is Jenna Davis, [phone].  Pt is vaccinated for covid and boosted.  CSW spoke with Case manager Jenna Davis who is requesting pt H and P, op note, HH order, and DC summary be faxed to [phone].  She can arrange [facility] once she has all this.    1255: [place] orders, op note, Hand P faxed to worker comp/Jenna Davis.  CSW confirmed receipt by phone, per Jenna Davis, after [facility] is authorized she will reach out to pt and schedule the first visit.      1330: Pt recommended for walker and 3n1, per Jenna Davis, she would like [employer] to provide and they will reimburse.  DME orders faxed to [facility].  CSW spoke with Jenna Davis from [facility] and he is going to research to make sure they can work with workers comp.          Expected Discharge Plan: Jenna Davis Barriers to Discharge: No Barriers Identified   Patient Goals and CMS Choice Patient states their goals for this hospitalization and ongoing recovery are:: get back to work [employer] Medicare.gov Compare Post Acute Care list provided to::  (NA-workers comp is payer)    Expected Discharge Plan and Services Expected Discharge Plan: [place] In-house Referral: Clinical Social Work   Post Acute Care Choice: Jenna Davis arrangements for the past 2 months: [place] Expected Discharge Date: 10/12/20                                    Prior Living  Arrangements/Services Living arrangements for the past 2 months: Single Family Home Lives with:: Self Patient language and need for interpreter reviewed:: Yes        Need for Family Participation in Patient Care: Yes (Comment) Care giver support system in place?: Yes (comment) Current home services: Other (comment) (none) Criminal Activity/Legal Involvement Pertinent to Current Situation/Hospitalization: No - Comment as needed  Activities of Daily Living      Permission Sought/Granted Permission sought to share information with : Family Supports,Other (comment) Permission granted to share information with : Yes, Verbal Permission Granted  Share Information with NAME: sisters Jenna Davis and Jenna Davis granted to share info w AGENCY: Workers Comp case manager: Jenna Davis, [phone]        Emotional Assessment Appearance:: Appears stated age Attitude/Demeanor/Rapport: Engaged Affect (typically observed): Appropriate,Pleasant Orientation: : Oriented to Self,Oriented to Place,Oriented to  Time,Oriented to Situation Alcohol / Substance Use: Not Applicable Psych Involvement: No (comment)  Admission diagnosis:  Radiculopathy [M54.10] Patient Active Problem List   Diagnosis Date Noted  . Radiculopathy 10/11/2020  . Chronic back pain 10/03/2020  . Osteomyelitis ([place]) 01/26/2016  . RA (rheumatoid arthritis) ([place]) 01/26/2016  . GERD (gastroesophageal reflux disease) 01/26/2016  . A-fib ([place]) 01/26/2016  . Depression  01/26/2016   PCP:  Jenna Cockayne, MD Pharmacy:   [id number] [id number]: [phone]: [phone]    Social Determinants of Health (SDOH) Interventions    Readmission Risk Interventions No flowsheet data found.

## 2020-10-13 ENCOUNTER — Encounter (HOSPITAL_COMMUNITY): Payer: Self-pay | Admitting: Orthopedic Surgery

## 2020-10-13 MED FILL — Sodium Chloride IV Soln 0.9%: INTRAVENOUS | Qty: 1000 | Status: AC

## 2020-10-13 MED FILL — Heparin Sodium (Porcine) Inj 1000 Unit/ML: INTRAMUSCULAR | Qty: 30 | Status: AC

## 2020-10-16 ENCOUNTER — Emergency Department (HOSPITAL_COMMUNITY): Payer: BC Managed Care – PPO

## 2020-10-16 ENCOUNTER — Emergency Department (HOSPITAL_COMMUNITY)
Admission: EM | Admit: 2020-10-16 | Discharge: 2020-10-16 | Disposition: A | Discharge status: Home / Self Care | Payer: BC Managed Care – PPO | Attending: Emergency Medicine | Admitting: Emergency Medicine

## 2020-10-16 DIAGNOSIS — R1110 Vomiting, unspecified: Secondary | ICD-10-CM | POA: Insufficient documentation

## 2020-10-16 DIAGNOSIS — F17210 Nicotine dependence, cigarettes, uncomplicated: Secondary | ICD-10-CM | POA: Insufficient documentation

## 2020-10-16 DIAGNOSIS — L02212 Cutaneous abscess of back [any part, except buttock]: Secondary | ICD-10-CM | POA: Diagnosis not present

## 2020-10-16 DIAGNOSIS — Z5189 Encounter for other specified aftercare: Secondary | ICD-10-CM

## 2020-10-16 DIAGNOSIS — Z4801 Encounter for change or removal of surgical wound dressing: Secondary | ICD-10-CM | POA: Diagnosis not present

## 2020-10-16 DIAGNOSIS — R1084 Generalized abdominal pain: Secondary | ICD-10-CM | POA: Diagnosis not present

## 2020-10-16 DIAGNOSIS — R509 Fever, unspecified: Secondary | ICD-10-CM | POA: Diagnosis not present

## 2020-10-16 DIAGNOSIS — M549 Dorsalgia, unspecified: Secondary | ICD-10-CM | POA: Diagnosis present

## 2020-10-16 LAB — URINALYSIS, ROUTINE W REFLEX MICROSCOPIC
Bacteria, UA: NONE SEEN
Glucose, UA: NEGATIVE mg/dL
Hgb urine dipstick: NEGATIVE
Ketones, ur: 5 mg/dL — AB
Leukocytes,Ua: NEGATIVE
Nitrite: NEGATIVE
Protein, ur: 30 mg/dL — AB
Specific Gravity, Urine: 1.031 — ABNORMAL HIGH (ref 1.005–1.030)
pH: 5 (ref 5.0–8.0)

## 2020-10-16 LAB — COMPREHENSIVE METABOLIC PANEL
ALT: 17 U/L (ref 0–44)
AST: 32 U/L (ref 15–41)
Albumin: 3.5 g/dL (ref 3.5–5.0)
Alkaline Phosphatase: 59 U/L (ref 38–126)
Anion gap: 12 (ref 5–15)
BUN: 21 mg/dL (ref 8–23)
CO2: 19 mmol/L — ABNORMAL LOW (ref 22–32)
Calcium: 9.1 mg/dL (ref 8.9–10.3)
Chloride: 106 mmol/L (ref 98–111)
Creatinine, Ser: 1.03 mg/dL — ABNORMAL HIGH (ref 0.44–1.00)
GFR, Estimated: >60 mL/min (ref >60)
Glucose, Bld: 131 mg/dL — ABNORMAL HIGH (ref 70–99)
Potassium: 3.5 mmol/L (ref 3.5–5.1)
Sodium: 137 mmol/L (ref 135–145)
Total Bilirubin: 1 mg/dL (ref 0.3–1.2)
Total Protein: 7.6 g/dL (ref 6.5–8.1)

## 2020-10-16 LAB — CBC WITH DIFFERENTIAL/PLATELET
Abs Immature Granulocytes: 0.07 10*3/uL (ref 0.00–0.07)
Basophils Absolute: 0 10*3/uL (ref 0.0–0.1)
Basophils Relative: 0 %
Eosinophils Absolute: 0 10*3/uL (ref 0.0–0.5)
Eosinophils Relative: 0 %
HCT: 31 % — ABNORMAL LOW (ref 36.0–46.0)
Hemoglobin: 10.5 g/dL — ABNORMAL LOW (ref 12.0–15.0)
Immature Granulocytes: 1 %
Lymphocytes Relative: 11 %
Lymphs Abs: 1.2 10*3/uL (ref 0.7–4.0)
MCH: 34.2 pg — ABNORMAL HIGH (ref 26.0–34.0)
MCHC: 33.9 g/dL (ref 30.0–36.0)
MCV: 101 fL — ABNORMAL HIGH (ref 80.0–100.0)
Monocytes Absolute: 0.8 10*3/uL (ref 0.1–1.0)
Monocytes Relative: 7 %
Neutro Abs: 8.9 10*3/uL — ABNORMAL HIGH (ref 1.7–7.7)
Neutrophils Relative %: 81 %
Platelets: 340 10*3/uL (ref 150–400)
RBC: 3.07 MIL/uL — ABNORMAL LOW (ref 3.87–5.11)
RDW: 14.4 % (ref 11.5–15.5)
WBC: 10.9 10*3/uL — ABNORMAL HIGH (ref 4.0–10.5)
nRBC: 0.2 % (ref 0.0–0.2)

## 2020-10-16 LAB — LIPASE, BLOOD: Lipase: 35 U/L (ref 11–51)

## 2020-10-16 MED ORDER — MAGNESIUM CITRATE PO SOLN
1.0000 | Freq: Once | ORAL | Status: AC
  Administered 2020-10-16: 1 via ORAL
  Filled 2020-10-16: qty 296

## 2020-10-16 MED ORDER — ONDANSETRON 4 MG PO TBDP
8.0000 mg | ORAL_TABLET | Freq: Once | ORAL | Status: AC
  Administered 2020-10-16: 8 mg via ORAL
  Filled 2020-10-16: qty 2

## 2020-10-16 MED ORDER — POLYETHYLENE GLYCOL 3350 17 G PO PACK: 17.0000 g | PACK | Freq: Three times a day (TID) | ORAL | 0 refills | Status: DC

## 2020-10-16 MED ORDER — HYDROMORPHONE HCL 1 MG/ML IJ SOLN
1.0000 mg | Freq: Once | INTRAMUSCULAR | Status: AC
  Administered 2020-10-16: 1 mg via INTRAMUSCULAR
  Filled 2020-10-16: qty 1
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
## 2020-10-16 NOTE — ED Provider Notes (Signed)
[facility] EMERGENCY DEPARTMENT Provider Note   CSN: [id number] Arrival date & time: 10/16/20  1815     History Chief Complaint  Patient presents with  . Back Pain  . Abdominal Pain    Jenna Davis is a 65 y.o. female.  HPI She presents for evaluation of back pain present and worsening for 2 days.  She is unable to tolerate her pain medicine.  She has been vomiting today.  She feels like she is having chills and fever.  She is able to walk.  She cannot remember when she last had a bowel movement.  She had a surgical procedure, done 3 days ago, anterior and posterior spinal fusion.  She denies cough, shortness of breath, chest pain, weakness or dizziness.  She reports pain in her anterior thighs bilaterally.  No other known active modifying factors    Past Medical History:  Diagnosis Date  . A-fib (HCC)   . Depression   . GERD (gastroesophageal reflux disease)   . RA (rheumatoid arthritis) (HCC)   . Rheumatoid arthritis [facility])     Patient Active Problem List   Diagnosis Date Noted  . Radiculopathy 10/11/2020  . Chronic back pain 10/03/2020  . Osteomyelitis (HCC) 01/26/2016  . RA (rheumatoid arthritis) (HCC) 01/26/2016  . GERD (gastroesophageal reflux disease) 01/26/2016  . A-fib (HCC) 01/26/2016  . Depression 01/26/2016    Past Surgical History:  Procedure Laterality Date  . ABDOMINAL EXPOSURE N/A 10/11/2020   Procedure: ABDOMINAL EXPOSURE;  Surgeon: Cephus Shelling, MD;  Location: [facility] OR;  Service: Vascular;  Laterality: N/A;  . ANTERIOR LUMBAR FUSION N/A 10/11/2020   Procedure: ANTERIOR LUMBAR INTERBODY FUSION LUMBAR 4 - LUMBAR 5 WITH POSTERIOR SPINAL FUSION WITH INSTRUMENTATION AND ALLOGRAFT;  Surgeon: Estill Bamberg, MD;  Location: [facility] OR;  Service: Orthopedics;  Laterality: N/A;  . ATRIAL FIBRILLATION ABLATION    . OVARIAN CYST REMOVAL       OB History   No obstetric history on file.     Family History  Problem Relation Age of Onset   . Diabetes Mother   . Breast cancer Mother   . Diabetes Father     Social History   Tobacco Use  . Smoking status: Current Every Day Smoker    Packs/day: 0.50  . Smokeless tobacco: Never Used  Vaping Use  . Vaping Use: Never used  Substance Use Topics  . Alcohol use: No  . Drug use: No    Home Medications Prior to Admission medications   Medication Sig Start Date End Date Taking? Authorizing Provider  polyethylene glycol (MIRALAX / GLYCOLAX) 17 g packet Take 17 g by mouth 3 (three) times daily. 10/16/20  Yes Mancel Bale, MD  acetaminophen (TYLENOL) 325 MG tablet Take 2 tablets (650 mg total) by mouth every 6 (six) hours as needed for mild pain (or Fever >/= 101). 01/27/16   Enid Baas, MD  buPROPion (WELLBUTRIN XL) 150 MG 24 hr tablet Take 150 mg by mouth every morning. 08/25/20   [provider]  Cholecalciferol (D3 PO) Take 1 capsule by mouth daily.    [provider]  cyclobenzaprine (FLEXERIL) 5 MG tablet Take 5-10 mg by mouth at bedtime as needed for pain. 09/06/20   [provider]  dicyclomine (BENTYL) 20 MG tablet Take 20 mg by mouth 4 (four) times daily as needed for other. Stomach cramps 09/11/20   [provider]  folic acid (FOLVITE) 1 MG tablet Take 1 mg by mouth daily.  02/03/15   [provider]  gabapentin (NEURONTIN) 300 MG capsule Take 900 mg by mouth 3 (three) times daily. 04/06/18   [provider]  haloperidol (HALDOL) 0.5 MG tablet Take 0.5 mg by mouth every morning. 09/19/20   [provider]  hydrOXYzine (ATARAX/VISTARIL) 10 MG tablet Take 10 mg by mouth 3 (three) times daily as needed for itching. 09/11/20   [provider]  methocarbamol (ROBAXIN) 500 MG tablet Take 1 tablet (500 mg total) by mouth every 6 (six) hours as needed for muscle spasms. 10/12/20   Dumonski, Loraine Leriche, MD  OTREXUP 25 MG/0.4ML SOAJ Inject 25 mg into the skin once a week. 07/27/20   [provider]   oxyCODONE-acetaminophen (PERCOCET/ROXICET) 5-325 MG tablet Take 1-2 tablets by mouth every 4 (four) hours as needed for severe pain. 10/12/20   Estill Bamberg, MD  traMADol (ULTRAM) 50 MG tablet Take 50 mg by mouth every 8 (eight) hours as needed for pain. 09/29/20   [provider]    Allergies    Patient has no known allergies.  Review of Systems   Review of Systems  All other systems reviewed and are negative.   Physical Exam Updated Vital Signs BP 123/70   Pulse 89   Temp 98.3 F (36.8 C) (Oral)   Resp 13   SpO2 99%   Physical Exam Vitals and nursing note reviewed.  Constitutional:      General: She is not in acute distress.    Appearance: She is well-developed. She is not ill-appearing, toxic-appearing or diaphoretic.  HENT:     Head: Normocephalic and atraumatic.     Right Ear: External ear normal.     Left Ear: External ear normal.  Eyes:     Conjunctiva/sclera: Conjunctivae normal.     Pupils: Pupils are equal, round, and reactive to light.  Neck:     Trachea: Phonation normal.  Cardiovascular:     Rate and Rhythm: Normal rate.  Pulmonary:     Effort: Pulmonary effort is normal.  Abdominal:     General: There is no distension.     Comments: Healing anterior surgical wounds, without bleeding, drainage or induration.  Musculoskeletal:        General: Normal range of motion.     Cervical back: Normal range of motion and neck supple.  Skin:    General: Skin is warm and dry.     Comments: Healing posterior lumbar wounds without bleeding, drainage or fluctuance  Neurological:     Mental Status: She is alert and oriented to person, place, and time.     Cranial Nerves: No cranial nerve deficit.     Sensory: No sensory deficit.     Motor: No abnormal muscle tone.     Coordination: Coordination normal.  Psychiatric:        Mood and Affect: Mood normal.        Behavior: Behavior normal.        Thought Content: Thought content normal.        Judgment:  Judgment normal.     ED Results / Procedures / Treatments   Labs (all labs ordered are listed, but only abnormal results are displayed) Labs Reviewed  COMPREHENSIVE METABOLIC PANEL - Abnormal; Notable for the following components:      Result Value   CO2 19 (*)    Glucose, Bld 131 (*)    Creatinine, Ser 1.03 (*)    All other components within normal limits  URINALYSIS, ROUTINE W  REFLEX MICROSCOPIC - Abnormal; Notable for the following components:   APPearance HAZY (*)    Specific Gravity, Urine 1.031 (*)    Bilirubin Urine MODERATE (*)    Ketones, ur 5 (*)    Protein, ur 30 (*)    All other components within normal limits  CBC WITH DIFFERENTIAL/PLATELET - Abnormal; Notable for the following components:   WBC 10.9 (*)    RBC 3.07 (*)    Hemoglobin 10.5 (*)    HCT 31.0 (*)    MCV 101.0 (*)    MCH 34.2 (*)    Neutro Abs 8.9 (*)    All other components within normal limits  LIPASE, BLOOD  CBC WITH DIFFERENTIAL/PLATELET    EKG None  Radiology CT Abdomen Pelvis Wo Contrast  Result Date: 10/16/2020 CLINICAL DATA:  65 year old female with abdominal pain after spinal surgery on 10/11/2020. EXAM: CT ABDOMEN AND PELVIS WITHOUT CONTRAST TECHNIQUE: Multidetector CT imaging of the abdomen and pelvis was performed following the standard protocol without IV contrast. COMPARISON:  CT abdomen pelvis dated 08/26/2020. FINDINGS: Evaluation of this exam is limited in the absence of intravenous contrast as well as paucity of intra-abdominal fat and streak artifact caused by spinal hardware. Lower chest: The visualized lung bases are clear. No intra-abdominal free air or free fluid. Hepatobiliary: The liver is unremarkable. No intrahepatic biliary dilatation. The gallbladder is distended. No calcified gallstone or pericholecystic fluid. Pancreas: The pancreas is not visualized and not evaluated with certainty. Spleen: Normal in size without focal abnormality. Adrenals/Urinary Tract: The adrenal  glands are unremarkable. Nonobstructing bilateral renal calculi measure up to 4 mm in the interpolar right kidney. There is no hydronephrosis either side. The urinary bladder is grossly unremarkable. Stomach/Bowel: Small hiatal hernia. There is large amount of stool throughout the colon. There is no bowel obstruction. The appendix is not identified. Vascular/Lymphatic: There is moderate aortoiliac atherosclerotic disease. The IVC is poorly visualized. No portal venous gas. Evaluation for adenopathy is very limited. Reproductive: The uterus is not well visualized. Other: There is diffuse subcutaneous edema and anasarca. No fluid collection. There is loss of subcutaneous and intra-abdominal fat and cachexia. Musculoskeletal: There is L4-L5 disc spacer and posterior fusion. There is grade 1 L4-L5 anterolisthesis. Age indeterminate fracture of the posterior right eleventh rib, new since the prior CT. IMPRESSION: 1. Constipation. No bowel obstruction. 2. Nonobstructing bilateral renal calculi. No hydronephrosis. 3. L4-L5 disc spacer and posterior fusion and grade 1 anterolisthesis. 4. Age indeterminate fracture of the posterior right eleventh rib, new since the prior CT. 5. Aortic Atherosclerosis (ICD10-I70.0). Electronically Signed   By: Elgie Collard M.D.   On: 10/16/2020 22:42    Procedures Procedures   Medications Ordered in ED Medications  magnesium citrate solution 1 Bottle (has no administration in time range)  HYDROmorphone (DILAUDID) injection 1 mg (1 mg Intramuscular Given 10/16/20 2236)  ondansetron (ZOFRAN-ODT) disintegrating tablet 8 mg (8 mg Oral Given 10/16/20 2233)    ED Course  I have reviewed the triage vital signs and the nursing notes.  Pertinent labs & imaging results that were available during my care of the patient were reviewed by me and considered in my medical decision making (see chart for details).    MDM Rules/Calculators/A&P                           Patient Vitals  for the past 24 hrs:  BP Temp Temp src Pulse Resp SpO2  10/16/20 2256  123/70 -- -- 89 13 99 %  10/16/20 1823 (!) 149/94 98.3 F (36.8 C) Oral (!) 110 (!) 22 97 %    10:59 PM Reevaluation with update and discussion. After initial assessment and treatment, an updated evaluation reveals she is more comfortable now has no further complaints.  Findings discussed with the patient and all questions were answered. Mancel Bale   Medical Decision Making:  This patient is presenting for evaluation of back pain and abdominal pain with chills and fever, which does require a range of treatment options, and is a complaint that involves a high risk of morbidity and mortality. The differential diagnoses include postoperative pain, constipation, surgical complication. I decided to review old records, and in summary elderly female presenting with pain after operative intervention lower back fusion, 3 days ago..  I did not require additional historical information from anyone.  Clinical Laboratory Tests Ordered, included CBC, Metabolic panel and Urinalysis. Review indicates Normal except white count high, hemoglobin low, MCV high, CO2 low, glucose high, creatinine high, urinalysis with elevated bilirubin, specific gravity high, ketones high, proteins present. Radiologic Tests Ordered, included CT abdomen pelvis.  I independently Visualized: Radiographic images, which show no acute complications from surgery or intra-abdominal processes with the exception of likely constipation    Critical Interventions-clinical evaluation, laboratory testing, CT imaging, medication treatment, observation and reassessment.  Additional medicines given.  After These Interventions, the Patient was reevaluated and was found stable for discharge.  I suspect her abdominal pain is related to constipation, likely multifactorial.  Medication for surgical complication or severe infection.  Doubt metabolic instability.  Patient instructed  on treatment of constipation and discharged.  CRITICAL CARE-no Performed by: Mancel Bale  Nursing Notes Reviewed/ Care Coordinated Applicable Imaging Reviewed Interpretation of Laboratory Data incorporated into ED treatment  The patient appears reasonably screened and/or stabilized for discharge and I doubt any other medical condition or other [facility] requiring further screening, evaluation, or treatment in the ED at this time prior to discharge.  Plan: Home Medications-continue usual; Home Treatments-plenty of fluids and increase fiber in diet; return here if the recommended treatment, does not improve the symptoms; Recommended follow up-neurologic surgery, and PCP, as needed     Final Clinical Impression(s) / ED Diagnoses Final diagnoses:  Back abscess  Generalized abdominal pain  Visit for wound check   *Note that patient does not have back abscess.  This was entered as a possible cause, requiring CT imaging.  Rx / DC Orders ED Discharge Orders         Ordered    polyethylene glycol (MIRALAX / GLYCOLAX) 17 g packet  3 times daily        10/16/20 2304           Mancel Bale, MD 10/16/20 2308
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
## 2020-10-16 NOTE — ED Notes (Signed)
Patient verbalized understanding of discharge instructions. Opportunity for questions and answers.  

## 2020-10-16 NOTE — ED Notes (Signed)
Patient transported to CT 

## 2020-10-16 NOTE — ED Provider Notes (Signed)
Emergency Medicine Provider Triage Evaluation Note  Alivya Wegman , a 65 y.o. female  was evaluated in triage.  Pt complains of abd pain and back pain that started today. She had spinal surgery on 5/25. Reports vomiting, sweats and chills.  Review of Systems  Positive: Back pain, vomiting, sweats, chills Negative: Chest pain  Physical Exam  BP (!) 149/94 (BP Location: Left Arm)   Pulse (!) 110   Temp 98.3 F (36.8 C) (Oral)   Resp (!) 22   SpO2 97%  Gen:   Awake Resp:  Normal effort  MSK:   Moves extremities without difficulty  Other:  Hunched over moaning, diffuse abd tenderness  Medical Decision Making  Medically screening exam initiated at 6:33 PM.  Appropriate orders placed.  Ireoluwa Schartz was informed that the remainder of the evaluation will be completed by another provider, this initial triage assessment does not replace that evaluation, and the importance of remaining in the ED until their evaluation is complete.     Rayne Du 10/16/20 1833    Tegeler, Canary Brim, MD 10/16/20 [id number]

## 2020-10-16 NOTE — ED Notes (Signed)
Pt walked out of bed; rn and Jesus informed pt that she cannot walk to the bathroom; she will fall. Jesus getting pt a wheel chair
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
## 2020-10-16 NOTE — ED Triage Notes (Signed)
Pt c/o back & abdominal pain after spinal surgery 5/25. Pt bent over wheelchair in triage, groaning in pain. States she's vomited for a day, denies passing blood/diarrhea. Pain getting worse since, states she's "freezing, then hot, freezing, then hot."

## 2020-10-16 NOTE — ED Notes (Addendum)
Pt walked out to nurse's station. This RN asked if pt needed anything and reminded her that she needs to stay in her room and use the call button. Pt began to walk back to her room and stated to this RN, "Yeah that's fine, keep watching me, I got your name." This RN stated that water will be brought to her after CT and that this RN was watching to make sure that pt safely returned to her room.

## 2020-10-20 NOTE — Unmapped (Signed)
appt

## 2020-10-23 DIAGNOSIS — N952 Postmenopausal atrophic vaginitis: Principal | ICD-10-CM

## 2020-10-23 DIAGNOSIS — N390 Urinary tract infection, site not specified: Principal | ICD-10-CM

## 2020-10-23 MED ORDER — ESTRADIOL 0.01% (0.1 MG/GRAM) VAGINAL CREAM
11 refills | 0 days
Start: 2020-10-23 — End: ?

## 2020-10-23 NOTE — Unmapped (Signed)
Prescriber not at this practice. LOV 06/08/2019 with Marina Goodell, NP. Last filled 6 months ago by Laural Benes, PA. She will need appointment with our office to continue medication refill.

## 2020-10-23 NOTE — Unmapped (Signed)
The [facility] has made a third and final attempt to reach this patient to refill the following medication:Otrexup.      We have been unable to leave messages on the following phone numbers: [phone] and have sent a MyChart message.    Dates contacted: 5/20, 6/6  Last scheduled delivery: 3/10    The patient may be at risk of non-compliance with this medication. The patient should call the [facility] at [phone] (option 4) to refill medication.    Olga Millers   [facility] Specialty Technician

## 2020-10-24 ENCOUNTER — Ambulatory Visit: Admit: 2020-10-24 | Payer: PRIVATE HEALTH INSURANCE

## 2020-10-26 NOTE — Discharge Summary (Signed)
Patient ID: Jenna Davis MRN: [id number] DOB/AGE: Jan 06, 1956 65 y.o.  Admit date: 10/11/2020 Discharge date: 10/12/2020  Admission Diagnoses:  Active Problems:   Radiculopathy   Discharge Diagnoses:  Same  Past Medical History:  Diagnosis Date   A-fib (HCC)    Depression    GERD (gastroesophageal reflux disease)    RA (rheumatoid arthritis) (HCC)    Rheumatoid arthritis (HCC)     Surgeries: Procedure(s): ANTERIOR LUMBAR INTERBODY FUSION LUMBAR 4 - LUMBAR 5 WITH POSTERIOR SPINAL FUSION WITH INSTRUMENTATION AND ALLOGRAFT POSTERIOR LUMBAR FUSION 1 LEVEL ABDOMINAL EXPOSURE on 10/11/2020   Consultants: Treatment Team:  Cephus Shelling, MD Tyrone Nine, MD  Discharged Condition: Improved  Hospital Course: Jenna Davis is an 65 y.o. female who was admitted 10/11/2020 for operative treatment of radiculopathy. Patient has severe unremitting pain that affects sleep, daily activities, and work/hobbies. After pre-op clearance the patient was taken to the operating room on 10/11/2020 and underwent  Procedure(s): ANTERIOR LUMBAR INTERBODY FUSION LUMBAR 4 - LUMBAR 5 WITH POSTERIOR SPINAL FUSION WITH INSTRUMENTATION AND ALLOGRAFT POSTERIOR LUMBAR FUSION 1 LEVEL ABDOMINAL EXPOSURE.    Patient was given perioperative antibiotics:  Anti-infectives (From admission, onward)    Start     Dose/Rate Route Frequency Ordered Stop   10/11/20 1630  ceFAZolin (ANCEF) IVPB 2g/100 mL premix        2 g 200 mL/hr over 30 Minutes Intravenous Every 8 hours 10/11/20 1522 10/12/20 0027   10/11/20 0615  ceFAZolin (ANCEF) IVPB 2g/100 mL premix        2 g 200 mL/hr over 30 Minutes Intravenous On call to O.R. 10/11/20 [id number] 10/11/20 0750        Patient was given sequential compression devices, early ambulation to prevent DVT.  Patient benefited maximally from hospital stay and there were no complications.    Recent vital signs: BP 108/83 (BP Location: Right Arm)   Pulse (!) 105   Temp  98.4 F (36.9 C) (Oral)   Resp 18   Ht  (1.676 m)   Wt 49 kg   SpO2 100%   BMI 17.43 kg/m    Discharge Medications:   Allergies as of 10/12/2020   No Known Allergies      Medication List     TAKE these medications    acetaminophen 325 MG tablet Commonly known as: TYLENOL Take 2 tablets (650 mg total) by mouth every 6 (six) hours as needed for mild pain (or Fever >/= 101).   buPROPion 150 MG 24 hr tablet Commonly known as: WELLBUTRIN XL Take 150 mg by mouth every morning.   cyclobenzaprine 5 MG tablet Commonly known as: FLEXERIL Take 5-10 mg by mouth at bedtime as needed for pain.   D3 PO Take 1 capsule by mouth daily.   dicyclomine 20 MG tablet Commonly known as: BENTYL Take 20 mg by mouth 4 (four) times daily as needed for other. Stomach cramps   folic acid 1 MG tablet Commonly known as: FOLVITE Take 1 mg by mouth daily.   gabapentin 300 MG capsule Commonly known as: NEURONTIN Take 900 mg by mouth 3 (three) times daily.   haloperidol 0.5 MG tablet Commonly known as: HALDOL Take 0.5 mg by mouth every morning.   hydrOXYzine 10 MG tablet Commonly known as: ATARAX/VISTARIL Take 10 mg by mouth 3 (three) times daily as needed for itching.   methocarbamol 500 MG tablet Commonly known as: ROBAXIN Take 1 tablet (500 mg total) by mouth every 6 (six)  hours as needed for muscle spasms.   Otrexup 25 MG/0.4ML Soaj Generic drug: Methotrexate (PF) Inject 25 mg into the skin once a week.   oxyCODONE-acetaminophen 5-325 MG tablet Commonly known as: PERCOCET/ROXICET Take 1-2 tablets by mouth every 4 (four) hours as needed for severe pain.   traMADol 50 MG tablet Commonly known as: ULTRAM Take 50 mg by mouth every 8 (eight) hours as needed for pain.        Diagnostic Studies: CT Abdomen Pelvis Wo Contrast  Result Date: 10/16/2020 CLINICAL DATA:  65 year old female with abdominal pain after spinal surgery on 10/11/2020. EXAM: CT ABDOMEN AND PELVIS WITHOUT  CONTRAST TECHNIQUE: Multidetector CT imaging of the abdomen and pelvis was performed following the standard protocol without IV contrast. COMPARISON:  CT abdomen pelvis dated 08/26/2020. FINDINGS: Evaluation of this exam is limited in the absence of intravenous contrast as well as paucity of intra-abdominal fat and streak artifact caused by spinal hardware. Lower chest: The visualized lung bases are clear. No intra-abdominal free air or free fluid. Hepatobiliary: The liver is unremarkable. No intrahepatic biliary dilatation. The gallbladder is distended. No calcified gallstone or pericholecystic fluid. Pancreas: The pancreas is not visualized and not evaluated with certainty. Spleen: Normal in size without focal abnormality. Adrenals/Urinary Tract: The adrenal glands are unremarkable. Nonobstructing bilateral renal calculi measure up to 4 mm in the interpolar right kidney. There is no hydronephrosis either side. The urinary bladder is grossly unremarkable. Stomach/Bowel: Small hiatal hernia. There is large amount of stool throughout the colon. There is no bowel obstruction. The appendix is not identified. Vascular/Lymphatic: There is moderate aortoiliac atherosclerotic disease. The IVC is poorly visualized. No portal venous gas. Evaluation for adenopathy is very limited. Reproductive: The uterus is not well visualized. Other: There is diffuse subcutaneous edema and anasarca. No fluid collection. There is loss of subcutaneous and intra-abdominal fat and cachexia. Musculoskeletal: There is L4-L5 disc spacer and posterior fusion. There is grade 1 L4-L5 anterolisthesis. Age indeterminate fracture of the posterior right eleventh rib, new since the prior CT. IMPRESSION: 1. Constipation. No bowel obstruction. 2. Nonobstructing bilateral renal calculi. No hydronephrosis. 3. L4-L5 disc spacer and posterior fusion and grade 1 anterolisthesis. 4. Age indeterminate fracture of the posterior right eleventh rib, new since the  prior CT. 5. Aortic Atherosclerosis (ICD10-I70.0). Electronically Signed   By: Elgie Collard M.D.   On: 10/16/2020 22:42   DG Lumbar Spine 2-3 Views  Result Date: 10/11/2020 CLINICAL DATA:  Provided history: Surgery, elective. Additional history provided: L4-5 ALIF and posterior hardware. Provided fluoroscopy time 2 minutes, 28 seconds (20.68 mGy). EXAM: LUMBAR SPINE - 2-3 VIEW; DG C-ARM 1-60 MIN COMPARISON:  CT of the lumbar spine 08/14/2020. FINDINGS: PA and lateral view intraoperative fluoroscopic images of the lumbar spine are submitted, 4 images total. The lowest well-formed intervertebral disc space is designated L5-S1. On the most recent provided images, an interbody device is present at L4-L5. A posterior spinal fusion construct is also present at L4-L5 (bilateral pedicle screws and vertical interconnecting rods). Persistent although improved L4-L5 grade 1 anterolisthesis. IMPRESSION: Four intraoperative fluoroscopic images of the lumbar spine from L4-L5 fusion, as described. Electronically Signed   By: Jackey Loge DO   On: 10/11/2020 13:04   CT L-SPINE NO CHARGE  Result Date: 10/16/2020 CLINICAL DATA:  Initial evaluation for acute back and abdominal pain after recent spinal surgery. EXAM: CT LUMBAR SPINE WITHOUT CONTRAST TECHNIQUE: Multidetector CT imaging of the lumbar spine was performed without intravenous contrast administration. Multiplanar CT image  reconstructions were also generated. COMPARISON:  Prior radiograph from 10/11/2020. FINDINGS: Segmentation: Standard. Lowest well-formed disc space labeled the L5-S1 level. Alignment: 8 mm anterolisthesis of L4 on L5, slightly improved as compared to previous exam. Trace 3 mm anterolisthesis of L3 on L4, with 2 mm retrolisthesis of L1 on L2, otherwise stable. Vertebrae: Postoperative changes from interval PLIF at L4-5. Hardware appears well positioned and intact. No abnormal periprosthetic lucency. There is an acute nondisplaced fracture  extending through the right transverse process of L5 (series 10, image 49), new from prior. No other acute fracture. Mild height loss at the superior endplate of L1 is stable and chronic in nature. Visualized sacrum and pelvis intact. SI joints symmetric and normal. No worrisome osseous lesions. Paraspinal and other soft tissues: Normal expected postoperative changes within the posterior paraspinous soft tissues. No visible collections. Aorto bi-iliac atherosclerotic disease noted. Bilateral nonobstructive nephrolithiasis. Prominent stool within the colon, suggesting constipation. Disc levels: T12-L1: Mild disc bulge with annular calcification. No spinal stenosis. Foramina remain patent. L1-2: Trace retrolisthesis. Degenerative intervertebral disc space narrowing with mild disc bulge. Moderate facet hypertrophy. No spinal stenosis. Mild bilateral L1 foraminal narrowing. L2-3: Left foraminal to extraforaminal disc protrusion (series 8, image 52), which could potentially affect the exiting left L2 nerve root. Moderate bilateral facet hypertrophy. No significant spinal stenosis. Foramina remain patent. L3-4: 3 mm anterolisthesis. Degenerative intervertebral disc space narrowing with mild disc bulge. Superimposed broad right foraminal disc protrusion (series 9, image 66), closely approximating the exiting right L3 nerve root. Moderate facet hypertrophy. No more than mild canal with bilateral lateral recess stenosis. Mild right L3 foraminal narrowing. Left neural foramina remains patent. L4-5: 8 mm anterolisthesis. Interval PLIF. Moderate facet hypertrophy. No obvious abnormality about the spinal canal or significant residual stenosis. Foramina appear patent. L5-S1: Advanced degenerative intervertebral disc space narrowing with disc desiccation and diffuse disc bulge, eccentric to the right. Associated reactive endplate spurring. Mild facet hypertrophy. No significant spinal stenosis. Mild bilateral L5 foraminal  narrowing. IMPRESSION: 1. Acute nondisplaced fracture extending through the right transverse process of L5. 2. Postoperative changes from interval PLIF at L4-5 with no other complication. 3. Right foraminal disc protrusion at L3-4, closely approximating the exiting right L3 nerve root. 4. Left foraminal to extraforaminal disc protrusion at L2-3, which could potentially affect the exiting left L2 nerve root. 5. Bilateral nonobstructive nephrolithiasis. 6. Aortic atherosclerosis. Aortic Atherosclerosis (ICD10-I70.0). Electronically Signed   By: Rise Mu M.D.   On: 10/16/2020 23:26   DG C-Arm 1-60 Min  Result Date: 10/11/2020 CLINICAL DATA:  Provided history: Surgery, elective. Additional history provided: L4-5 ALIF and posterior hardware. Provided fluoroscopy time 2 minutes, 28 seconds (20.68 mGy). EXAM: LUMBAR SPINE - 2-3 VIEW; DG C-ARM 1-60 MIN COMPARISON:  CT of the lumbar spine 08/14/2020. FINDINGS: PA and lateral view intraoperative fluoroscopic images of the lumbar spine are submitted, 4 images total. The lowest well-formed intervertebral disc space is designated L5-S1. On the most recent provided images, an interbody device is present at L4-L5. A posterior spinal fusion construct is also present at L4-L5 (bilateral pedicle screws and vertical interconnecting rods). Persistent although improved L4-L5 grade 1 anterolisthesis. IMPRESSION: Four intraoperative fluoroscopic images of the lumbar spine from L4-L5 fusion, as described. Electronically Signed   By: Jackey Loge DO   On: 10/11/2020 13:04   DG OR LOCAL ABDOMEN  Result Date: 10/11/2020 CLINICAL DATA:  Postop ALIF.  Instrument count. EXAM: OR LOCAL ABDOMEN COMPARISON:  Lumbar CT 08/14/2020 FINDINGS: Metal fusion device overlies  the L4-5 disc space presumably from an anterior approach. There are surgical clips in the surrounding soft tissues. No retained surgical instrument. IMPRESSION: Anterior fusion L4-5. Surgical clips in the  surrounding soft tissues. No retained instrument. These results were called by telephone at the time of interpretation on 10/11/2020 at 11:43 am to provider Rosanna, who verbally acknowledged these results. Electronically Signed   By: Marlan Palau M.D.   On: 10/11/2020 11:44    Disposition: Discharge disposition: 01-Home or Self Care        POD #1 s/p L4-5 anterior/posterior fusion, doing very well.  The patient's mental status has dramatically improved -Scripts for pain sent to pharmacy electronically  -D/C instructions sheet printed and in chart -D/C today  -F/U in office 2 weeks   Signed: Georga Bora 10/26/2020, 10:17 AM

## 2020-11-06 ENCOUNTER — Ambulatory Visit: Admit: 2020-11-06 | Payer: PRIVATE HEALTH INSURANCE

## 2020-11-06 NOTE — Unmapped (Unsigned)
Assessment/Plan:   Bethany Meyer is a 65 y.o. 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    1. Seropositive (+CCP) non erosive RA   Patient with adequate control of disease, she does note severe pain in her legs -- suspect that this is secondary to her spinal stenosis given improvement with leaning forward.   - continue  MTX 25mg  weekly subcutaneous  - Continue folic acid 1mg  daily  - had ritux 1g q14 days x2 doses with improvement in symptoms (12/30/19 and 01/13/20), was due (06/2020)     2.  Spinal stenosis  Patient with an MRI from 12/24/19 demonstrating severe spinal canal stenosis and foraminal narrowing at L4-5.  She has previously seen spinal center and had an injection, but she notes did not seem to help dramatically.  She is currently enrolled in physical therapy which she notes has helped. She did see neurosurgery and would like more time to consider.     3. Paresthesias   She has had prior work up including vitamin B12 729 from 06/24/2018, SPEP within normal limits on immunofixation and RPR negative. The paresthesias started in her feet with numbness but now can be felt throughout her body.  It is also this could be secondary to underlying pain from her stenosis although that would not explain her whole body paresthesias.     4. Tobacco use  She is smoking at least a pack per day.   -- smoking cessation encouraged      Health Care Maintenance:  Routine health maintenance discussed  Immunization History   Administered Date(s) Administered Comments   ??? COVID-19 VACC,MRNA,(PFIZER)(PF)(IM) 08/06/2019 [facility]   ???  08/27/2019 [facility]   ??? COVID-19 VACCINE,MRNA(MODERNA)(PF)(IM) 06/29/2019    ???  07/27/2019    ???  04/07/2020    ??? Hepatitis B, Adult 01/03/2015    ???  03/13/2015    ??? Influenza Vaccine Quad (IIV4 PF) 12mo+ injectable 02/10/2013    ???  05/03/2014    ???  03/13/2015 ???  03/20/2016    ???  07/12/2017    ???  03/11/2018    ???  02/14/2020    ??? Influenza Virus Vaccine, unspecified formulation 02/10/2013    ???  05/03/2014    ???  03/13/2015    ???  03/20/2016    ???  07/12/2017    ???  03/11/2018    ??? Influenza, High Dose (IIV4) 65 yrs & older 03/03/2019    ??? PNEUMOCOCCAL POLYSACCHARIDE 23 01/11/2015    ??? Pneumococcal conjugate -PCV7 02/18/2012    ??? TdaP 01/01/2011    Deferred Date(s) Deferred Comments   ??? Influenza Vaccine Quad (IIV4 PF) 38mo+ injectable 05/17/2019    ???  06/24/2019 Pt remember she had a dose already last year.  See immunization record.           -COVID-19: has had two (finished in March) and booster           - PCV 7: 02/18/12 -- listed as PCV7 -- at next visit will determine if she has had PCV 13.           - PCV 23: 01/11/15          -Influenza: will obtain today (although she has had recently and will try to get booster and potentially a second dose of influenza vaccine prior to next Ritux infusion 05/2020).     CDC  recommends all immunosuppressed adults receive vaccination against pneumonococcus.. Adults 19 years or older who have not received any pneumococcal vaccine, should get a dose of PCV13 first and should also continue to receive the recommended doses of PPSV23. Adults 19 years or older who have previously received one or more doses of PPSV23, should also receive a dose of PCV13 and should continue to receive the remaining recommended doses of PPSV23.    Diagnoses and all orders for this visit:  ***   RTC in No follow-ups on file.    I appreciate the opportunity to participate and collaborate in the care of this patient.      History of Present Illness:     Primary Care Provider: Karrie Doffing, PA    HPI:  Bethany Meyer is a 65 y.o.  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  ??  As background, Ms. Verbeke's joint pain first started around May/June 2016 with right knee pain and swelling, then right foot and ankle swelling. She is RF negative, CCP positive. She was started on methotrexate but had persistent joint pain and swelling so was started on Humira in March 2017. She is on methotrexate 20mg  weekly po (have been unable to get SQ approved by insurance). ??She was increased to weekly Humira but continued to have joint pain and swelling. ??Enbrel was not approved by insurance so prescription was sent for [name] instead in 2018. [name] was initially effective but the patient experienced several flares while on this medication. This was discontinued in May 2019 and she was started on Cimzia in June 2019. Cimzia was discontinued in summer 2020 due to lack of perceived benefit from medication.    Since fall 2021 she has been switched to Rasuvo for ease of injection.     06/2020 appointment discussed continuing with Rituximab -- patient has not been seen for infusion.     Today ***       Balance of 10 systems was reviewed and is negative except for that mentioned in the HPI.    Review of records: I have reviewed labs/images/clinic notes per the computerized medical record.   X-ray: IMPRESSION:  -Subtle cortical irregularity at the second middle phalanx at the PIP joint, which is only visualized on oblique views and may be artifactual. Recommend correlation with point tenderness if fracture in this region is suspected.    Allergies:  Patient has no known allergies.    Medications:     Current Outpatient Medications:   ???  acetaminophen (TYLENOL) 325 MG tablet, Take 325 mg by mouth two (2) times a day as needed for pain., Disp: , Rfl:   ???  arm brace Misc, Boxer splint, metacarpal wrist brace, Disp: 1 each, Rfl: 0  ???  aspirin 81 MG chewable tablet, CHEW 1 TABLET BY MOUTH DAILY, Disp: 36 tablet, Rfl: 3  ???  buPROPion (WELLBUTRIN XL) 150 MG 24 hr tablet, Take 1 tablet (150 mg total) by mouth every morning., Disp: 90 tablet, Rfl: 3  ???  cefdinir (OMNICEF) 300 MG capsule, Take 1 capsule (300 mg total) by mouth every twelve (12) hours., Disp: 14 capsule, Rfl: 0  ???  cyclobenzaprine (FLEXERIL) 5 MG tablet, Take 5-10 mg by mouth nightly as needed. (Patient not taking: Reported on 08/26/2020), Disp: , Rfl:   ???  dicyclomine (BENTYL) 20 mg tablet, Take 1 tablet (20 mg total) by mouth four times a day  as needed (stomach cramps)., Disp: 360 tablet, Rfl: 3  ???  empty container Misc, Use as directed, Disp: 1 each, Rfl: 2  ???  estradioL (ESTRACE) 0.01 % (0.1 mg/gram) vaginal cream, Insert 2 g into the vagina Two (2) times a week. Insert 2 g inside vagina and apply a pea-sized amount around urethra twice weekly, Disp: , Rfl: 0  ???  folic acid (FOLVITE) 1 MG tablet, Take 1 tablet (1 mg total) by mouth daily., Disp: 100 tablet, Rfl: 3  ???  gabapentin (NEURONTIN) 300 MG capsule, Take 3 capsules (900 mg total) by mouth Three times a day., Disp: 270 capsule, Rfl: 3  ???  haloperidoL (HALDOL) 0.5 MG tablet, Take 1 tablet (0.5 mg total) by mouth every morning before breakfast., Disp: 90 tablet, Rfl: 3  ???  hydrOXYzine (ATARAX) 10 MG tablet, Take 1 tablet (10 mg total) by mouth Three times a day as needed for itching., Disp: 90 tablet, Rfl: 1  ???  leg brace (KNEE SUPPORT BRACE) Misc, 1 each by Miscellaneous route daily as needed., Disp: 1 each, Rfl: 0  ???  methocarbamoL (ROBAXIN) 500 MG tablet, Take 1 tablet (500 mg total) by mouth Three times a day., Disp: 90 tablet, Rfl: 1  ???  methotrexate, PF, (OTREXUP, PF,) 25 mg/0.4 mL [name], Inject the contents of 1 pen (25mg ) under the skin every 7 days, Disp: 4.8 mL, Rfl: 1  ???  mirtazapine (REMERON) 15 MG tablet, Take 2 tablets (30 mg total) by mouth nightly., Disp: 180 tablet, Rfl: 1  ???  needle, disp, 25 gauge 25 gauge x 5/8 Ndle, For methotrexate injections, Disp: 100 each, Rfl: 0  ???  nicotine (NICODERM CQ) 14 mg/24 hr patch, Place 1 patch on the skin daily., Disp: 28 patch, Rfl: 1  ???  pantoprazole (PROTONIX) 40 MG tablet, Take 1 tablet (40 mg total) by mouth daily. (Patient not taking: Reported on 08/26/2020), Disp: 90 tablet, Rfl: 3  ???  sucralfate (CARAFATE) 100 mg/mL suspension, Take 10 mL (1 g total) by mouth Four times a day (before meals and nightly)., Disp: 420 mL, Rfl: 11  ???  traMADoL (ULTRAM) 50 mg tablet, Take 1 tablet (50 mg total) by mouth every six (6) hours as needed for pain for up to 10 doses., Disp: 10 tablet, Rfl: 0  ???  VITAMIN B-1 100 mg tablet, Take 1 tablet (100 mg total) by mouth daily., Disp: 100 tablet, Rfl: 3    Medical History:  Past Medical History:   Diagnosis Date   ??? Arthritis     RA   ??? Cataract    ??? Functional dyspepsia    ??? GERD (gastroesophageal reflux disease)    ??? Gout    ??? RA (rheumatoid arthritis) (CMS-HCC)    ??? Smoking    ??? Stage 1 mild COPD by GOLD classification (CMS-HCC) 02/25/2017       Surgical History:  Past Surgical History:   Procedure Laterality Date   ??? ovarian cyst removed     ??? OVARY SURGERY     ??? PR COLONOSCOPY FLX DX W/COLLJ SPEC WHEN PFRMD N/A 03/05/2013    Procedure: COLONOSCOPY, FLEXIBLE, PROXIMAL TO SPLENIC FLEXURE; DIAGNOSTIC, W/WO COLLECTION SPECIMEN BY BRUSH OR WASH;  Surgeon: Trudie Buckler, MD;  Location: GI PROCEDURES [facility];  Service: Gastroenterology   ??? PR COMPRE EP EVAL ABLTJ 3D MAPG TX SVT N/A 12/26/2015    Procedure: A-Flutter Ablation;  Surgeon: Theresia Majors, MD;  Location: [facility] EP;  Service: Cardiology   ???  PR UP GI ENDOSCOPY,BALL DIL,30MM N/A 06/03/2019    Procedure: UGI ENDO; W/BALLOON DILAT ESOPHAGUS (<30MM DIAM);  Surgeon: Neysa Hotter, MD;  Location: [facility];  Service: Gastroenterology   ??? PR UPPER GI ENDOSCOPY,BIOPSY N/A 09/16/2012    Procedure: UGI ENDOSCOPY; WITH BIOPSY, SINGLE OR MULTIPLE;  Surgeon: Brown Human, MD;  Location: [facility];  Service: Gastroenterology   ??? PR UPPER GI ENDOSCOPY,BIOPSY N/A 06/03/2019    Procedure: UGI ENDOSCOPY; WITH BIOPSY, SINGLE OR MULTIPLE;  Surgeon: Neysa Hotter, MD;  Location: [facility] PROCEDURES [facility];  Service: Gastroenterology   ??? PR UPPER GI ENDOSCOPY,DIAGNOSIS N/A 06/03/2019    Procedure: UGI ENDO, INCLUDE ESOPHAGUS, STOMACH, & DUODENUM &/OR JEJUNUM; DX W/WO COLLECTION SPECIMN, BY BRUSH OR WASH;  Surgeon: Neysa Hotter, MD;  Location: [facility] GI PROCEDURES [facility];  Service: Gastroenterology       Social History:  Social History     Tobacco Use   ??? Smoking status: Current Every Day Smoker     Packs/day: 1.00     Years: 35.00     Pack years: 35.00     Types: Cigarettes   ??? Smokeless tobacco: Never Used   ??? Tobacco comment: Started smoking at 33,    Vaping Use   ??? Vaping Use: Some days   Substance Use Topics   ??? Alcohol use: No     Alcohol/week: 0.0 standard drinks   ??? Drug use: Yes     Types: Marijuana     Comment: once a month       Family History:  Family History   Problem Relation Age of Onset   ??? Diabetes Mother         type 2   ??? Breast cancer Mother    ??? Diabetes Father         type 2   ??? Cataracts Father    ??? Macular degeneration Neg Hx    ??? Retinal detachment Neg Hx    ??? Colon cancer Neg Hx    ??? Endometrial cancer Neg Hx    ??? Ovarian cancer Neg Hx    ??? Glaucoma Neg Hx    ??? Blindness Neg Hx            Objective   There were no vitals filed for this visit.    Physical Exam:  General: Well-appearing.  HEENT: Sclera anicteric;   Pulmonary: Unremarkable respiratory effort. CTAB.  Cardiac: S1 and S2 present, RRR. No murmurs/rubs/gallops.  Neuro: Moving all four extremities, able to walk to the exam table independently although with a limp. Unable to tell which way big toe is pointing on left foot for proprioception.    Psych: Euthymic affect.  MSK: No overt bony deformities or gross abnormalities in range of motion. Patient with increased pain with lying back and improved pain when leaning forward.    ??? Hands: No swelling or tenderness to palpation of the MCPs, PIPs, or DIPs.  ??? Wrists: No swelling or tenderness to palpation of the wrists or deficit in range of motion.  ??? Elbows: No swelling or tenderness to palpation of the elbows.  ??? Shoulders: No swelling or tenderness to palpation of the bilateral shoulders.Marland Kitchen  ??? Hips: No swelling or tenderness to palpation of the lateral hips or over the femur.   ??? Knees: No swelling or tenderness to palpation of the bilateral knees.  ??? Ankles: No swelling or tenderness to palpation of the bilateral  ankles.  ??? Feet: No swelling or tenderness to palpation of the ankle or MTPs.  Skin: No evidence of erythema or rash.  Extremities: Warm, no LE edema.    Test Results  Lab Results   Component Value Date    ANA Negative 11/30/2014       Lab Results   Component Value Date    CKTOTAL 208.0 (H) 02/14/2020    CRP <4.0 06/19/2020    ESR 55 (H) 06/19/2020       Lab Results   Component Value Date    CREATUR 180.6 07/02/2019    PROTEINUR <4.0 08/27/2019    PCRATIOUR 0.087 07/02/2019       No results found for: EJO1, ERNP, ESCL, ESMA, ESSA, ESSB    Lab Results   Component Value Date    HBSAG Nonreactive 12/23/2014    HEPBSAB Reactive (A) 06/23/2019    QFTTBGOLD Negative 10/01/2017    HEPCAB Nonreactive 06/23/2019       Lab Results   Component Value Date    NITRITE Negative 08/26/2020    PROTEINUA Negative 08/26/2020    RBCUA 0 08/26/2020    WBC 7.4 08/26/2020    BLOODU Negative 08/26/2020    KETONESU Trace (A) 08/26/2020       No results found for: ANCA, ANCAIFA, MPO, MPOQT, PR3ELISA

## 2020-11-24 MED FILL — METHOCARBAMOL 500 MG TABLET: ORAL | 30 days supply | Qty: 90 | Fill #1

## 2020-11-24 MED FILL — DICYCLOMINE 20 MG TABLET: ORAL | 25 days supply | Qty: 100 | Fill #3

## 2020-11-24 MED FILL — GABAPENTIN 300 MG CAPSULE: ORAL | 30 days supply | Qty: 270 | Fill #2

## 2020-11-24 MED FILL — HYDROXYZINE HCL 10 MG TABLET: ORAL | 14 days supply | Qty: 40 | Fill #2

## 2020-11-29 DIAGNOSIS — M0579 Rheumatoid arthritis with rheumatoid factor of multiple sites without organ or systems involvement: Principal | ICD-10-CM

## 2020-11-29 NOTE — Unmapped (Signed)
[facility] Refill Coordination Note    Specialty Medication(s) to be Shipped:   Inflammatory Disorders: methotrexate (injectable)    Other medication(s) to be shipped: No additional medications requested for fill at this time     Bethany Meyer, DOB: 09/14/1955  Phone: [phone] (home)       All above HIPAA information was verified with patient.     Was a Nurse, learning disability used for this call? No    Completed refill call assessment today to schedule patient's medication shipment from the [facility] [phone]).  All relevant notes have been reviewed.     Specialty medication(s) and dose(s) confirmed: Regimen is correct and unchanged.   Changes to medications: Bethany Meyer reports no changes at this time.  Changes to insurance: No  New side effects reported not previously addressed with a pharmacist or physician: None reported  Questions for the pharmacist: No    Confirmed patient received a Conservation officer, historic buildings and a Surveyor, mining with first shipment. The patient will receive a drug information handout for each medication shipped and additional FDA Medication Guides as required.       DISEASE/MEDICATION-SPECIFIC INFORMATION        For patients on injectable medications: Patient currently has 2 doses left.  Next injection is scheduled for 12/01/2020.    SPECIALTY MEDICATION ADHERENCE     Medication Adherence    Patient reported X missed doses in the last month: 0  Specialty Medication: methotrexate (PF): [name] [name]  Patient is on additional specialty medications: No        Were doses missed due to medication being on hold? No    REFERRAL TO PHARMACIST     Referral to the pharmacist: Not needed      Bethany Meyer     Shipping address confirmed in Epic.     Delivery Scheduled: Yes, Expected medication delivery date: 12/06/2020.  However, Rx request for refills was sent to the provider as there are none remaining.     Medication will be delivered via UPS to the prescription address in Epic WAM.    Bethany Meyer [facility] Specialty Technician

## 2020-11-29 NOTE — Unmapped (Signed)
[facility] RHEUMATOLOGY CLINIC - PHARMACIST NOTES    Ms. Bethany Meyer is currently filling Otrexup through the [facility]. The [facility] have made 3 unsuccessful attempts to refill her medication(s) the past few weeks.  Last shipment went out on 07/27/20 for a 3 month supply.      Attempt to reach patient from clinic for follow up today. Spoke with patient and transferred call to [facility] pharmacy for medication refill.     Wynonia Hazard  PharmD Candidate    DKG [id number] Need to sign note in order to close encounter.

## 2020-11-29 NOTE — Unmapped (Signed)
Methotrexate refill  Last Visit Date: 06/19/2020  Next Visit Date: Visit date not found    Lab Results   Component Value Date    ALT 17 08/26/2020    AST 35 08/26/2020    ALBUMIN 4.0 08/26/2020    CREATININE 0.70 08/26/2020     Lab Results   Component Value Date    WBC 7.4 08/26/2020    HGB 11.7 08/26/2020    HCT 35.1 08/26/2020    PLT 377 08/26/2020     Lab Results   Component Value Date    NEUTROPCT 63.1 08/26/2020    LYMPHOPCT 25.9 08/26/2020    MONOPCT 10.0 08/26/2020    EOSPCT 0.5 08/26/2020    BASOPCT 0.5 08/26/2020

## 2020-11-30 DIAGNOSIS — M0579 Rheumatoid arthritis with rheumatoid factor of multiple sites without organ or systems involvement: Principal | ICD-10-CM

## 2020-11-30 MED ORDER — OTREXUP (PF) 25 MG/0.4 ML SUBCUTANEOUS AUTO-INJECTOR
SUBCUTANEOUS | 1 refills | 84.00000 days | Status: CP
Start: 2020-11-30 — End: ?
  Filled 2020-12-05: qty 1.6, 28d supply, fill #0

## 2020-12-13 ENCOUNTER — Encounter
Admit: 2020-12-13 | Discharge: 2020-12-13 | Disposition: A | Discharge status: Home / Self Care | Payer: PRIVATE HEALTH INSURANCE | Attending: Emergency Medicine

## 2020-12-13 DIAGNOSIS — S72111A Displaced fracture of greater trochanter of right femur, initial encounter for closed fracture: Principal | ICD-10-CM

## 2020-12-13 DIAGNOSIS — K21 Gastroesophageal reflux disease with esophagitis without hemorrhage: Principal | ICD-10-CM

## 2020-12-13 DIAGNOSIS — M069 Rheumatoid arthritis, unspecified: Principal | ICD-10-CM

## 2020-12-13 DIAGNOSIS — M48062 Spinal stenosis, lumbar region with neurogenic claudication: Principal | ICD-10-CM

## 2020-12-13 MED ORDER — MORPHINE 15 MG IMMEDIATE RELEASE TABLET
ORAL_TABLET | ORAL | 0 refills | 3 days | Status: CP | PRN
Start: 2020-12-13 — End: 2020-12-18

## 2020-12-13 MED ADMIN — oxyCODONE (ROXICODONE) immediate release tablet 5 mg: 5 mg | ORAL | @ 14:00:00 | Stop: 2020-12-13

## 2020-12-13 MED ADMIN — oxyCODONE (ROXICODONE) immediate release tablet 5 mg: 5 mg | ORAL | @ 20:00:00 | Stop: 2020-12-13

## 2020-12-13 MED ADMIN — MORPhine injection 8 mg: 8 mg | INTRAMUSCULAR | @ 21:00:00 | Stop: 2020-12-13

## 2020-12-13 NOTE — Unmapped (Addendum)
Pt tripped and fell last night.and landed on right lig on hard wood floor.  C/o right upper leg and hip pain and is unable to bare weight. No head injury or LOC.    Pt had back Sx in May and is wearing a back brace.

## 2020-12-13 NOTE — Unmapped (Signed)
[facility]  Emergency Department Progress Note    December 13, 2020 3:06 PM    Recent VS:  Recent Patient Data       12/13/2020  0933 12/13/2020  0935 12/13/2020  1413       BP: -- 135/82 161/94     Pulse: 79 -- 81     Temp: 36.7 ??C (98.1 ??F) -- 37.1 ??C (98.8 ??F)     Resp: 18 -- 18     SpO2: 100 % -- 100 %         Bethany Meyer is a 65 y.o. female with a PMH of rheumatoid arthritis, gout, and stage I COPD presenting after a mechanical fall last night and woke up this morning with right leg pain, now with the inability to bear any weight. No LOC, or head strike.     Her XR showed an angulated, mildly displaced fracture of the right proximal femur greater trochanter. Will page Orthopedics for further workup and recommendations. She has been given two 5 mg doses of oxycodone to manage her pain.     Waiting on a CT right lower extremity without contrast. Plan to consult PT/OT.    16:25  CT confirms fracture, discussed with ortho, no change to plan. PT/OT has evaluted pt and pt would like to go home. Case management has set up home health. The patient has good family support. Will give additional pain meds for transport home. Will discharge with a prescription for morphine and advise to follow up with PCP for pain management. Plan for ortho follow up and return precautions given.           Additional Medical Decision Making      I have reviewed the patient's vital signs and the nursing notes. Any pertinent labs & imaging results which were available during my care of the patient were reviewed by me.     I independently visualized the radiology images.   I reviewed the patient's prior medical records (ED 10/16/2020).     Portions of this record have been created using Scientist, clinical (histocompatibility and immunogenetics). Dictation errors have been sought, but may not have been identified and corrected.        ED Clinical Impression       Final diagnoses:   Closed avulsion fracture of greater trochanter of femur, right, initial encounter (CMS-HCC) (Primary)         Documentation assistance was provided by Jillyn Hidden, Scribes on December 13, 2020 at 3:06 PM Shaune Leeks, MD.    December 13, 2020 4:54 PM. Documentation assistance provided by the scribe. I was present during the time the encounter was recorded. The information recorded by the scribe was done at my direction and has been reviewed and validated by me.

## 2020-12-13 NOTE — Unmapped (Signed)
Patient got up and walked to the bathroom with assistance from a walker.

## 2020-12-13 NOTE — Unmapped (Signed)
Emergency Department Provider Note    ED Clinical Impression     Final diagnoses:   None       ED Assessment/Plan     Bethany Meyer is a 65 y.o. female with PMH of rheumatoid arthritis, gout, and stage I COPD presenting after a mechanical fall last night and woke up this morning with R leg pain, now with the inability to bear any weight.  What is reassuring is that the patient has had normal straight leg raise.  She does have a lateral tenderness to palpation of the greater trochanter, which suggest to me that she might actually have a greater trochanter fracture.    11:16 AM  XR shows angulated, mildly displaced fracture of the right proximal femur greater trochanter. Will page Orthopedics for recommendations.  Orthopedics consultant recommends CT of the R lower extremity for further visualization of the fracture.  His recommendation is toe-touch weightbearing.    Update:  I am worried by the patient's mobility given that she also had a recent back surgery, and her mobility is limited as it is.  I obtained a physical therapy and Occupational Therapy consultation in the ED, and they felt the patient asked demonstrate good mobility and would be able to go home with home PT.  Of note, the patient has multiple family members who live on the same block with her, so she does have that support at home.  She also has what ever DME that she needs at home.    3:00 PM  At the time of shift change, care of the patient was transferred to the oncoming provider; patient is pending establishment of home PT, callback from orthopedics regarding the CT read, and eventual discharge home.  Pain control with p.o. narcotics as needed.      History     Chief Complaint   Patient presents with   ??? Fall     HPI  Bethany Meyer is a 65 y.o. female with PMH of rheumatoid arthritis, gout, and stage I COPD presenting today for evaluation after a fall. The patient reports a mechanical fall last night and woke up this morning with R leg pain. The patient landed on her R hip. No LOC, or head strike. She has not been able to ambulate since the fall. She denies any new numbness in her R foot. The patient has taken Tylenol with minimal relief. She had a prior back surgery and is currently wearing a brace. She denies any back pain or abdominal pain.     Past Medical History:   Diagnosis Date   ??? Arthritis     RA   ??? Cataract    ??? Functional dyspepsia    ??? GERD (gastroesophageal reflux disease)    ??? Gout    ??? RA (rheumatoid arthritis) (CMS-HCC)    ??? Smoking    ??? Stage 1 mild COPD by GOLD classification (CMS-HCC) 02/25/2017       Past Surgical History:   Procedure Laterality Date   ??? ovarian cyst removed     ??? OVARY SURGERY     ??? PR COLONOSCOPY FLX DX W/COLLJ SPEC WHEN PFRMD N/A 03/05/2013    Procedure: COLONOSCOPY, FLEXIBLE, PROXIMAL TO SPLENIC FLEXURE; DIAGNOSTIC, W/WO COLLECTION SPECIMEN BY BRUSH OR WASH;  Surgeon: Trudie Buckler, MD;  Location: [facility];  Service: Gastroenterology   ??? PR COMPRE EP EVAL ABLTJ 3D MAPG TX SVT N/A 12/26/2015    Procedure: A-Flutter Ablation;  Surgeon: Theresia Majors,  MD;  Location: [facility] EP;  Service: Cardiology   ??? PR UP GI ENDOSCOPY,BALL DIL,30MM N/A 06/03/2019    Procedure: UGI ENDO; W/BALLOON DILAT ESOPHAGUS (<30MM DIAM);  Surgeon: Neysa Hotter, MD;  [facility]: [facility];  Service: Gastroenterology   ??? PR UPPER GI ENDOSCOPY,BIOPSY N/A 09/16/2012    Procedure: UGI ENDOSCOPY; WITH BIOPSY, SINGLE OR MULTIPLE;  Surgeon: Brown Human, MD;  [facility]: [facility];  Service: Gastroenterology   ??? PR UPPER GI ENDOSCOPY,BIOPSY N/A 06/03/2019    Procedure: UGI ENDOSCOPY; WITH BIOPSY, SINGLE OR MULTIPLE;  Surgeon: Neysa Hotter, MD;  Location: [facility];  Service: Gastroenterology   ??? PR UPPER GI ENDOSCOPY,DIAGNOSIS N/A 06/03/2019    Procedure: UGI ENDO, INCLUDE ESOPHAGUS, STOMACH, & DUODENUM &/OR JEJUNUM; DX W/WO COLLECTION SPECIMN, BY BRUSH OR WASH; Surgeon: Neysa Hotter, MD;  Location: [facility];  Service: Gastroenterology       Family History   Problem Relation Age of Onset   ??? Diabetes Mother         type 2   ??? Breast cancer Mother    ??? Diabetes Father         type 2   ??? Cataracts Father    ??? Macular degeneration Neg Hx    ??? Retinal detachment Neg Hx    ??? Colon cancer Neg Hx    ??? Endometrial cancer Neg Hx    ??? Ovarian cancer Neg Hx    ??? Glaucoma Neg Hx    ??? Blindness Neg Hx        Social History     Socioeconomic History   ??? Marital status: Single     Spouse name: None   ??? Number of children: None   ??? Years of education: None   ??? Highest education level: None   Tobacco Use   ??? Smoking status: Current Every Day Smoker     Packs/day: 1.00     Years: 35.00     Pack years: 35.00     Types: Cigarettes   ??? Smokeless tobacco: Never Used   ??? Tobacco comment: Started smoking at 33,    Vaping Use   ??? Vaping Use: Some days   Substance and Sexual Activity   ??? Alcohol use: No     Alcohol/week: 0.0 standard drinks   ??? Drug use: Yes     Types: Marijuana     Comment: once a month   ??? Sexual activity: Never   Other Topics Concern   ??? Exercise No   ??? Living Situation Yes       Review of Systems   Musculoskeletal:        R leg pain.    All other systems reviewed and are negative.      Physical Exam     BP 135/82  - Pulse 79  - Temp 36.7 ??C (98.1 ??F) (Oral)  - Resp 18  - Ht 168.9 cm (5' 6.5)  - Wt 53.1 kg (117 lb)  - LMP 05/20/2005  - SpO2 100%  - BMI 18.60 kg/m??     Physical Exam  Vitals and nursing note reviewed.   Constitutional:       General: She is not in acute distress.     Appearance: Normal appearance. She is well-developed.   HENT:      Head: Normocephalic and atraumatic.   Eyes:      Extraocular Movements: Extraocular movements intact.  Conjunctiva/sclera: Conjunctivae normal.   Pulmonary:      Effort: Pulmonary effort is normal.   Musculoskeletal:         General: Normal range of motion.      Cervical back: Normal range of motion. Comments: Tenderness palpation of the right greater trochanter  Straight leg raise intact on the right leg   Skin:     General: Skin is dry.      Coloration: Skin is not jaundiced or pale.   Neurological:      General: No focal deficit present.      Mental Status: She is alert and oriented to person, place, and time.      Comments:   Motor IP/Q/TA/EHL/GS/FHL intact       L2/L3 - IP (iliopsoas): flex hip       L3 - Q (quadriceps): extend knee       L4 - TA (tibialis anterior): dorsiflex foot       L5 - EHL (extensor hallucis longus), dorsiflex big toe       S1 - GS (gastrosoleus): plantarflex foot       S2 - FHL (flexor hallucis longus): plantarflex big toe    SILT s/s/sp/dp/ta       Sural: lateral foot       Saphenous: medial foot       Superficial peroneal: top of foot       Deep peroneal: web space between 1st and 2nd toes       Tibial: bottom of foot     Psychiatric:         Behavior: Behavior normal.         ED Course            MDM  Reviewed: previous chart, nursing note and vitals  Interpretation: labs and x-ray        Documentation assistance was provided by Caro Hight, Scribe on December 13, 2020 at 9:58 AM for Herbie Drape, MD, MHS.    -------------------------------------------------------------------------------------  Documentation assistance was provided by the scribe in my presence.  The documentation recorded by the scribe has been reviewed by me and accurately reflects the services I personally performed.    Shalayna Caligiuri was evaluated in Emergency Department at the time of this visit for the symptoms described in the history of present illness. she was evaluated in the context of the global COVID-19 pandemic, which necessitated consideration that the patient might be at risk for infection with the SARS-CoV-2 virus that causes COVID-19. Institutional protocols and algorithms that pertain to the evaluation of patients at risk for COVID-19 were followed during the patient's care in the ED.      Please note that portions of this record have been created using Scientist, clinical (histocompatibility and immunogenetics). Dictation errors have been sought, but may not have been identified and corrected.         Jorene Minors, MD  12/13/20 [phone]

## 2020-12-13 NOTE — Unmapped (Addendum)
OCCUPATIONAL THERAPY  Evaluation (12/13/20 1417)    Patient Name:  Bethany Meyer       Medical Record Number: [id number]   Date of Birth: [birth year]  Sex: Female          OT Treatment Diagnosis:  Decreased strength resulting in decreased (I) in self care and functional mobility    Assessment  Problem List: Decreased range of motion, Decreased strength, Fall Risk, Decreased mobility, Impaired balance, Pain, Orthopedic restrictions    Assessment: Patient presents to OT s/p mechanical fall at home impacting pt's independence with ADLs and functional mobility/transfers compared to baseline. X-ray (+) for mildly displaced fracture of the right proximal femur greater trochanter. Pt had back surgery 10/11/20; donning back brace this date with spinal precautions. Patient educated on purpose and goal of OT. Patient completing bed mobility this date with SBA and min vc for safe technique. Pt completing functional transfers with SBA using RW. Pt demonstrating good safety and understanding of WB precautions. Pt completed face washing task with set-up seated EOB. Anticipate pt will require Mod A for LB dressing 2/2 spinal precautions and RLE pain. Pt exhibiting increased pain and decreased strength resulting in decreased (I) in self care and functional mobility. Patient to benefit from continued OT to address above mentioned deficits. Pt reports she will have her sisters 24/7 assistance s/p discharge.    After review of the patient's occupational profile and history, assessment of occupational performance, clinical decision making, and development of POC, the patient presents as a low complexity case. Based on the daily activity AM-PAC raw score of 19/24, the patient is considered to be 42.30% impaired with self care. Recommend post acute 3x with 24/7 assist WJX:BJYN    Today's Interventions: ADL retraining, Balance activities, Bed mobility, Education - Patient, Education - Family / caregiver, Endurance activities, Functional mobility, Safety education, Transfer training    Today's Interventions: Role of OT and POC, self care, standing balance, bed mobility, pt education, family education, endurance, safety awareness, transfers    Activity Tolerance During Today's Session  Tolerated treatment well    Plan  Planned Frequency of Treatment:  1-2x per day for: 3-4x week       Planned Interventions:  Adaptive equipment, ADL retraining, Balance activities, Bed mobility, Compensatory tech. training, Conservation, Education - Patient, Functional mobility, Functional cognition, Endurance activities, Education - Family / caregiver, Range of motion, Therapeutic exercise, Positioning, Safety education, UE Strength / coordination exercise, Transfer training    Post-Discharge Occupational Therapy Recommendations:   3x weekly (with 24/7 assist')   OT DME Recommendations: None -        GOALS:   Patient and Family Goals: To return home      Short Term:  Pt to complete LB dressing with supervision using AE PRN with LRAD   Time Frame : 1 week  Pt to complete toilet t/f, clothing management,toilet hygiene with supervision using LRAD   Time Frame : 1 week  Pt to complete +2 grooming tasks standing at sink with supervision using LRAD         Barriers to Discharge: Decreased caregiver support, Pain    Subjective  Current Status Pt received/left sitting up in bed, sister in room, RN aware  Prior Functional Status Pt has hx of back surgery on 10/11/20. Pt reports she been doing well since her recovery and has been (I) in ADLs/IADLs and functional mobility. Pt lives alone and says her sisters check in on her daily. Pt reporting her recent fall  was due to her slipping on a plastic shopping bag on her hardwood floor.    Medical Tests / Procedures: reviewed in EPIC  Services patient receives: OT, PT    Patient / Caregiver reports: RN and pt agreeable to OT    Past Medical History:   Diagnosis Date   ??? Arthritis     RA   ??? Cataract    ??? Functional dyspepsia ??? GERD (gastroesophageal reflux disease)    ??? Gout    ??? RA (rheumatoid arthritis) (CMS-HCC)    ??? Smoking    ??? Stage 1 mild COPD by GOLD classification (CMS-HCC) 02/25/2017    Social History     Tobacco Use   ??? Smoking status: Current Every Day Smoker     Packs/day: 1.00     Years: 35.00     Pack years: 35.00     Types: Cigarettes   ??? Smokeless tobacco: Never Used   ??? Tobacco comment: Started smoking at 49,    Substance Use Topics   ??? Alcohol use: No     Alcohol/week: 0.0 standard drinks      Past Surgical History:   Procedure Laterality Date   ??? ovarian cyst removed     ??? OVARY SURGERY     ??? PR COLONOSCOPY FLX DX W/COLLJ SPEC WHEN PFRMD N/A 03/05/2013    Procedure: COLONOSCOPY, FLEXIBLE, PROXIMAL TO SPLENIC FLEXURE; DIAGNOSTIC, W/WO COLLECTION SPECIMEN BY BRUSH OR WASH;  Surgeon: Trudie Buckler, MD;  Location: [facility];  Service: Gastroenterology   ??? PR COMPRE EP EVAL ABLTJ 3D MAPG TX SVT N/A 12/26/2015    Procedure: A-Flutter Ablation;  Surgeon: Theresia Majors, MD;  Location: [facility] EP;  Service: Cardiology   ??? PR UP GI ENDOSCOPY,BALL DIL,30MM N/A 06/03/2019    Procedure: UGI ENDO; W/BALLOON DILAT ESOPHAGUS (<30MM DIAM);  Surgeon: Neysa Hotter, MD;  Location: [facility];  Service: Gastroenterology   ??? PR UPPER GI ENDOSCOPY,BIOPSY N/A 09/16/2012    Procedure: UGI ENDOSCOPY; WITH BIOPSY, SINGLE OR MULTIPLE;  Surgeon: Brown Human, MD;  [facility]: [facility];  Service: Gastroenterology   ??? PR UPPER GI ENDOSCOPY,BIOPSY N/A 06/03/2019    Procedure: UGI ENDOSCOPY; WITH BIOPSY, SINGLE OR MULTIPLE;  Surgeon: Neysa Hotter, MD;  Location: [facility];  Service: Gastroenterology   ??? PR UPPER GI ENDOSCOPY,DIAGNOSIS N/A 06/03/2019    Procedure: UGI ENDO, INCLUDE ESOPHAGUS, STOMACH, & DUODENUM &/OR JEJUNUM; DX W/WO COLLECTION SPECIMN, BY BRUSH OR WASH;  Surgeon: Neysa Hotter, MD;  Location: [facility];  Service: Gastroenterology    Family History   Problem Relation Age of Onset   ??? Diabetes Mother         type 2   ??? Breast cancer Mother    ??? Diabetes Father         type 2   ??? Cataracts Father    ??? Macular degeneration Neg Hx    ??? Retinal detachment Neg Hx    ??? Colon cancer Neg Hx    ??? Endometrial cancer Neg Hx    ??? Ovarian cancer Neg Hx    ??? Glaucoma Neg Hx    ??? Blindness Neg Hx         Patient has no known allergies.     Objective Findings  Precautions / Restrictions  Falls precautions, Spinal precautions (Spinal precautions from back surgery on 10/11/20)    Weight Bearing  RLE  TTWB - Toe touch weight bearing    Required Braces or Orthoses  LSO - Lumbar, Sacral Orthosis    Communication Preference  Verbal    Pain  Pt reporting 8/10 pain in R hip    Equipment / Environment  Patient wearing mask for full session, Caregiver wearing mask for full session    Living Situation  Living Environment: Physiological scientist  Lives With: Alone  Home Living: One level home, Tub/shower unit, Stairs to alternate level with rails, Bedside commode, Standard height toilet  Number of Stairs to Enter (outside): 4 (4 steps for back door entrance; 3 steps for front door entrance)  Rail placement (inside): Bilateral rails  Equipment available at home: [employer], Bedside commode     Cognition   Orientation Level:  Oriented x 4   Arousal/Alertness:  Appropriate responses to stimuli   Attention Span:  Appears intact   Memory:  Appears intact   Following Commands:  Follows all commands and directions without difficulty   Safety Judgment:  Good awareness of safety precautions   Awareness of Errors:  Good awareness of safety precautions   Problem Solving:  Able to problem solve independently   Comments:      Vision / Hearing   Vision: Wears glasses all the time, Glasses not present     Hearing: No deficit identified         Hand Function:  Right Hand Function: Right hand grip strength, ROM and coordination WNL  Left Hand Function: Left hand grip strength, ROM and coordination WNL  Hand Dominance: Right    Skin Inspection:  Skin Inspection: Intact where visualized    ROM / Strength:  UE ROM/Strength: Left WFL, Right WFL  LE ROM/Strength: Left WFL, Right Impaired/Limited  RLE Impairment: Reduced strength, Limited AROM, Pain with movement    Coordination:  Coordination: WFL    Sensation:  RUE Sensation: RUE intact  LUE Sensation: LUE intact  RLE Sensation: RLE impaired  RLE Sensation Impairment: Numbness  LLE Sensation: LLE impaired  LLE Sensation Impairment: Numbness  Sensory/ Proprioception/ Stereognosis comments: Pt reporting chronic toe numbness    Balance:  Static/dynamic sitting: Mod I, Static/Dyanmic syanding: SBA    Functional Mobility  Transfer Assistance Needed: Yes  Transfers - Needs Assistance: Standby assist  Bed Mobility Assistance Needed: Yes  Bed Mobility - Needs Assistance: Standby assist  Ambulation: Pt walked 10 ft in patient room with SBA using RW; pt demonstrating good safety and understanding of RLE WB precautions       ADLs  ADLs: Needs assistance with ADLs, Supervision  ADLs - Needs Assistance: LB dressing, UB dressing, Grooming, Toileting  Grooming - Needs Assistance: Set Up Assist, Performed seated  Toileting - Needs Assistance: Physical assistance required, Requires additional structure (NT; anticipate SBA for standing tasks)  UB Dressing - Needs Assistance: Set Up Assist, Performed seated (NT; anticipate set-up while seated)  LB Dressing - Needs Assistance: Mod assist (NT: anticipate Mod A for LB dressing secondary to spinal precautions and pain in RLE)  IADLs: NT      Vitals / Orthostatics  At Rest: NAD  With Activity: NAD  Orthostatics: asymptomatic      Medical Staff Made Aware: RN aware      Occupational Therapy Session Duration  OT Individual [mins]: 25         I attest that I have reviewed the above information.  Signed: Brain Hilts, OT  Filed 12/13/2020

## 2020-12-13 NOTE — Unmapped (Signed)
PHYSICAL THERAPY  Evaluation (12/13/20 1518)          Patient Name:?? Bethany Meyer????????   Medical Record Number: [id number]   Date of Birth: 1955/07/18  Sex: Female??  ??    Treatment Diagnosis: mobility assessment s/p fall resulting mildly displaced fracture of the right proximal femur greater trochanter     Activity Tolerance: Tolerated treatment well     ASSESSMENT  Problem List: Decreased mobility, Gait deviation, Pain, Decreased endurance, Fall Risk, Impaired sensation, Impaired balance, Decreased strength, Orthopedic restrictions, Impaired ADLs      Assessment : 65 y.o. female with a history of AFib/flutter s/p ablation in NSR since, depression, rheumatoid arthritis, gout and chronic back pain who underwent spinal fusion for spinal stenosis with radiculopathy on 5/25 (s/p L4-5 anterior/posterior fusion with spinal precautions and thoracolumbosacral orthotic). Pt presents to [facility] ED s/p mechanical fall last night and woke up with right leg pain and inability to bear weight. XR shows angulated, mildly displaced fracture of the right proximal femur greater trochanter.     Pt presents to skilled PT Services with R hip pain and toe touch weight bearing orders impacting her balance and functional mobility. Pt able to perform functional transfers and ambulation with RW CGA-SBA with VC to extend R LE pre transfers and sequencing while ambulating to maintain TTWB R LE. Pt would benefit from continued skilled PT services to address safety, education and deficits. Based on the AM-PAC 6 item raw score of 20/24, the patient is considered to be 33.32% impaired with basic mobility. This indicates 3x/wk post acute stay. Highly rec 24/7 assist at home (pt reporting her family can provide 24/7 assist at dc)       Moderate complexity evaluation completed based on patients PMHx, current deficits and clinical judgement.     Today's Interventions: AMPAC 20/24; Educ rE: PT role, toe touch weight bearing R LE orders, transfer and gait trials with RW to maintain toe touch weight bearing R LE, positioning in bed, review of spinal precautions and use of log roll technique for bed mobility towards patients left side to minimize weight bearing through R greater trochanter, review avoiding hyper abduction 2/2 greater trochanter fracture, review of appropriate footwear (pt currently in slides- rec closed heel shoes or nonslid socks), review of short distance ambulation for safety, continued use of RW, post acute recs of HHPT with 24/7 assist from family, review of step navigation to maintain toe touch weight bearing; bed mobility, transfers, gait, stairs                     Clinical Decision Making: Moderate      PLAN  Planned Frequency of Treatment:?? 1-2x per day for: 4-5x week       Planned Interventions: Balance activities, Education - Patient, Education - Family / caregiver, Investment banker, operational, Home exercise program, Therapeutic activity, Transfer training, Therapeutic exercise, Stair training, Self-care / Home training, Functional mobility, Endurance activities     Post-Discharge Physical Therapy Recommendations:?? 3x weekly (with 24/7 assist from family)     PT DME Recommendations:  (pt owns RW and BSC)??????????       Goals:                  SHORT GOAL #1: Pt will perform all transfers with LRAD mod I while maintaining TTWB R LE  ?????????????????????? Time Frame : 2 weeks  SHORT GOAL #2: Pt will ambulate 100 ft with LRAD mod I while maintaining TTWB R LE  ??????????????????????  Time Frame : 2 weeks  SHORT GOAL #3: Pt will navigate 4 steps with bilat rails mod I while maintaining TTWB R LE  ?????????????????????? Time Frame : 2 weeks     ??????????????????????       ??????????????????????       Prognosis:?? Good  Positive Indicators: motivated, family support  Barriers to Discharge: Decreased caregiver support, Pain (pt reports she lives alone, but states her family will provide 24/7)     SUBJECTIVE  Patient reports: Pt/RN agreeable to therapy session; I want to go home if I can  Current Functional Status: received/session ended with pt supine in bed with HOB/FOB elevated- RN aware  Services patient receives: OT, PT  Prior Functional Status: Pt reports indep with functional mobility without any device; reports fall last night and woke up this am unable to bear any weight  Equipment available at home: Levan Hurst, Bedside commode      Past Medical History:   Diagnosis Date   ??? Arthritis     RA   ??? Cataract    ??? Functional dyspepsia    ??? GERD (gastroesophageal reflux disease)    ??? Gout    ??? RA (rheumatoid arthritis) (CMS-HCC)    ??? Smoking    ??? Stage 1 mild COPD by GOLD classification (CMS-HCC) 02/25/2017            Social History     Tobacco Use   ??? Smoking status: Current Every Day Smoker     Packs/day: 1.00     Years: 35.00     Pack years: 35.00     Types: Cigarettes   ??? Smokeless tobacco: Never Used   ??? Tobacco comment: Started smoking at 80,    Substance Use Topics   ??? Alcohol use: No     Alcohol/week: 0.0 standard drinks       Past Surgical History:   Procedure Laterality Date   ??? ovarian cyst removed     ??? OVARY SURGERY     ??? PR COLONOSCOPY FLX DX W/COLLJ SPEC WHEN PFRMD N/A 03/05/2013    Procedure: COLONOSCOPY, FLEXIBLE, PROXIMAL TO SPLENIC FLEXURE; DIAGNOSTIC, W/WO COLLECTION SPECIMEN BY BRUSH OR WASH;  Surgeon: Trudie Buckler, MD;  Location: GI PROCEDURES [facility];  Service: Gastroenterology   ??? PR COMPRE EP EVAL ABLTJ 3D MAPG TX SVT N/A 12/26/2015    Procedure: A-Flutter Ablation;  Surgeon: Theresia Majors, MD;  [facility];  Service: Cardiology   ??? PR UP GI ENDOSCOPY,BALL DIL,30MM N/A 06/03/2019    Procedure: UGI ENDO; W/BALLOON DILAT ESOPHAGUS (<30MM DIAM);  Surgeon: Neysa Hotter, MD;  [facility];  Service: Gastroenterology   ??? PR UPPER GI ENDOSCOPY,BIOPSY N/A 09/16/2012    Procedure: UGI ENDOSCOPY; WITH BIOPSY, SINGLE OR MULTIPLE;  Surgeon: Brown Human, MD;  [facility];  Service: Gastroenterology   ??? PR UPPER GI ENDOSCOPY,BIOPSY N/A 06/03/2019    Procedure: UGI ENDOSCOPY; WITH BIOPSY, SINGLE OR MULTIPLE;  Surgeon: Neysa Hotter, MD;  Location: [facility];  Service: Gastroenterology   ??? PR UPPER GI ENDOSCOPY,DIAGNOSIS N/A 06/03/2019    Procedure: UGI ENDO, INCLUDE ESOPHAGUS, STOMACH, & DUODENUM &/OR JEJUNUM; DX W/WO COLLECTION SPECIMN, BY BRUSH OR WASH;  Surgeon: Neysa Hotter, MD;  Location: [facility];  Service: Gastroenterology             Family History   Problem Relation Age of Onset   ???  Diabetes Mother         type 2   ??? Breast cancer Mother    ??? Diabetes Father         type 2   ??? Cataracts Father    ??? Macular degeneration Neg Hx    ??? Retinal detachment Neg Hx    ??? Colon cancer Neg Hx    ??? Endometrial cancer Neg Hx    ??? Ovarian cancer Neg Hx    ??? Glaucoma Neg Hx    ??? Blindness Neg Hx         Allergies: Patient has no known allergies.                  Objective Findings  Precautions / Restrictions  Precautions: Falls precautions, Spinal precautions (Spinal precautions from back surgery on 10/11/20)  Weight Bearing Status:  (R LE toe touch weight bearing)  Required Braces or Orthoses:  (aspen brace)     Communication Preference: Verbal          Pain Comments: reports 6-7/10 pain at right hip  Medical Tests / Procedures: chart review per epic; XRAY: Angulated, mildly displaced fracture of the right proximal femur greater trochanter. CT: Acute moderately comminuted and minimally displaced fracture of the greater trochanter of the right proximal femur. Small adjacent soft tissue hematoma/fluid. No additional fractures. Mild pelvic subcutaneous anasarca with small amount of free fluid within the pelvis.  Equipment / Environment: Patient wearing mask for full session, Caregiver wearing mask for full session     At Rest: VSS per epic, RN cleared  With Activity: NAD           Living Situation  Living Environment: Physiological scientist  Lives With: Alone (reports her sister lives next door; reports her family will provide her 24/7 assist)  Home Living: One level home, Tub/shower unit, Bedside commode, Standard height toilet, Stairs to enter with rails (4 steps for back door entrance; 3 steps for front door entrance with bilat rails)  Number of Stairs to Enter (outside): 4  Rail placement (inside): Bilateral rails      Cognition comment: Aox4  Visual/Perception: Within Functional Limits     Skin Inspection: Intact where visualized     Upper Extremities  UE ROM: Right WFL, Left WFL  UE Strength: Right WFL, Left WFL    Lower Extremities  LE ROM: Left WFL, Right Impaired/Limited  RLE ROM Impairment: Pain with movement  LE Strength: Left WFL, Right Impaired/Limited  RLE Strength Impairment: Reduced strength, Pain with movement  LE comment: did not resist     Sensation comment: SILT  Balance comment: sitting EOB indep; standing with RW SBA      Bed Mobility: supine<>short sit towards patients left side modified indep via log roll technique- therapist emphasized continued bed mobility towards patients left size to minimize weight bearing through R greater trochanter fracture     Transfer comments: sit<>stand with RW from EOB SBA with vc to extend R LE pre transfer to maintain toe touch weight bearing      Gait: Pt ambulated 2x20 ft with RW via step to gait pattern with VC for appropriate sequencing, emphasized use of B UE during R LE stance phase of gait and maintaining toe touch weight bearing -CGA     Stairs: Pt negotiated 2 steps with bilat rails with VC for step to pattern and maintaining NWB R LE during steps to ensure safety and minimize weight bearing more than toe touch -min-CGA provided for  safety                  Physical Therapy Session Duration  PT Individual [mins]: 36     Medical Staff Made Aware: RN aware     I attest that I have reviewed the above information.  Signed: Lonzo Candy, PT  Filed 12/13/2020

## 2020-12-13 NOTE — Unmapped (Signed)
Care Management  Initial Transition Planning Assessment              General  Care Manager assessed the patient by : Medical record review, In person interview with patient, In person interview with family, Discussion with Clinical Care team  Orientation Level: Oriented X4  Reason for referral: Discharge Planning    Contact/Decision Maker  Extended Emergency Contact Information  Primary Emergency Contact: Cleon Gustin  Mobile Phone: [phone]  Relation: Daughter  Preferred language: ENGLISH  Interpreter needed? No  Secondary Emergency Contact: Adah Salvage [place]  Home Phone: [phone]  Mobile Phone: [phone]  Relation: Sister    Legal Next of Kin / Guardian / POA / Advance Directives     HCDM (patient stated preference): Cleon Gustin - Daughter - [phone]    HCDM (patient stated preference): [employer] - Sister - [phone]    Advance Directive (Medical Treatment)  Does patient have an advance directive covering medical treatment?: Patient does not have advance directive covering medical treatment.  Reason patient does not have an advance directive covering medical treatment:: Patient does not wish to complete one at this time.    Health Care Decision Maker [HCDM] (Medical & Mental Health Treatment)  Healthcare Decision Maker: Patient does not wish to appoint a Health Care Decision Maker at this time  Information offered on HCDM, Medical & Mental Health advance directives:: Patient declined information.    Advance Directive (Mental Health Treatment)  Does patient have an advance directive covering mental health treatment?: Patient does not have advance directive covering mental health treatment.  Reason patient does not have an advance directive covering mental health treatment:: Patient does not wish to complete one at this time.    Readmission Information    Have you been hospitalized in the last 30 days?: No       Did the following happen with your discharge? Patient Information  Lives with: Family members    Type of Residence: Private residence        Location/Detail: [address] [place] [address] phone [phone]    Support Systems/Concerns: Family Members    Responsibilities/Dependents at home?: No    Home Care services in place prior to admission?: No          Outpatient/Community Resources in place prior to admission: Clinic  Agency detail (Name/Phone #): PCP Laural Benes    Equipment Currently Used at Home: walker, rolling, commode chair       Currently receiving outpatient dialysis?: No       Financial Information       Need for financial assistance?: No       Social Determinants of Health  Social Determinants of Health were addressed in provider documentation.  Please refer to patient history.  Social Determinants of Health     Tobacco Use: High Risk   ??? Smoking Tobacco Use: Current Every Day Smoker   ??? Smokeless Tobacco Use: Never Used   Alcohol Use: Not At Risk   ??? How often do you have a drink containing alcohol?: Never   ??? How many drinks containing alcohol do you have on a typical day when you are drinking?: 1 - 2   ??? How often do you have 5 or more drinks on one occasion?: Never   Financial Resource Strain: Low Risk    ??? Difficulty of Paying Living Expenses: Not very hard   Food Insecurity: No Food Insecurity   ??? Worried About Running Out  of Food in the Last Year: Never true   ??? Ran Out of Food in the Last Year: Never true   Transportation Needs: No Transportation Needs   ??? Lack of Transportation (Medical): No   ??? Lack of Transportation (Non-Medical): No   Physical Activity: Not on file   Stress: Not on file   Social Connections: Not on file   Intimate Partner Violence: Not At Risk   ??? Fear of Current or Ex-Partner: No   ??? Emotionally Abused: No   ??? Physically Abused: No   ??? Sexually Abused: No   Depression: At risk   ??? PHQ-2 Score: 5   Housing/Utilities: Low Risk    ??? Within the past 12 months, have you ever stayed: outside, in a car, in a tent, in an overnight shelter, or temporarily in someone else's home (i.e. couch-surfing)?: No   ??? Are you worried about losing your housing?: No   ??? Within the past 12 months, have you been unable to get utilities (heat, electricity) when it was really needed?: No   Substance Use: Low Risk    ??? Taken prescription drugs for non-medical reasons: Never   ??? Taken illegal drugs: Never   ??? Patient indicated they have taken drugs in the past year for non-medical reasons: Yes, [positive answer(s)]: Not on file   Health Literacy: Not on file       Complex Discharge Information    Is patient identified as a difficult/complex discharge?: No    Interventions:       Discharge Needs Assessment  Concerns to be Addressed: home safety, medication, cognitive/perceptual, adjustment to diagnosis/illness, discharge planning    Clinical Risk Factors: Principal Diagnosis: Cancer, Stroke, COPD, Heart Failure, AMI, Pneumonia, Joint Replacment, Multiple Diagnoses (Chronic), Functional Limitations, History of Falls, Lives Alone or Absence of Caregiver to Assist with Discharge and Home Care    Barriers to taking medications: No ([facility] [place] [facility] [address]    Prior overnight hospital stay or ED visit in last 90 days: Yes              Anticipated Changes Related to Illness: none    Equipment Needed After Discharge: none    Discharge Facility/Level of Care Needs: other (see comments) (home with home health)    Readmission  Risk of Unplanned Readmission Score:  %  Predictive Model Details   No score data available for [facility] Risk of Unplanned Readmission     Readmitted Within the Last 30 Days? (No if blank)   Patient at risk for readmission?: No    Discharge Plan  Screen findings are: Discharge planning needs identified or anticipated (Comment). (securing home health for PT/OT/HHA)    Expected Discharge Date:     Expected Transfer from Critical Care:      Quality data for continuing care services shared with patient and/or representative?: Yes  Patient and/or family were provided with choice of facilities / services that are available and appropriate to meet post hospital care needs?: Yes   List choices in order highest to lowest preferred, if applicable. : choice offered, no preference    Initial Assessment complete?: Yes

## 2020-12-14 ENCOUNTER — Encounter: Admit: 2020-12-14 | Payer: PRIVATE HEALTH INSURANCE

## 2020-12-14 NOTE — Unmapped (Signed)
This is a [facility] emergency department patient for whom I provided remote consultation.  This consultation was performed by synthesizing information from the emergency department provider over phone and reviewing patient's imaging.  In brief, this is a patient who sustained a fall onto her right hip and was found on x-ray to have sustained a greater trochanteric fracture with minimal displacement.  Patient reportedly was neurovascularly intact and just had pain with ambulation on presentation.  We recommended further evaluation with CT to confirm greater trochanteric fracture without further involvement.  At this time, we recommend toe-touch weightbearing with a walker.    Wyn Forster, MD, PhD  [facility] Orthopaedic Surgery, PGY-2

## 2020-12-18 NOTE — Unmapped (Signed)
Dr. Leda Min    Your patient Bethany Meyer is requesting a start of care date of 12/18/2020 with [facility]. Per regulatory standards we need your permission to start care on 12/18/2020. Please confirm if you are in agreement with this.    V.O received from Dr. Leda Min to change start of care date to 12/18/2020 at 630 PM on 12/17/2020.     Bethany Meyer BSN, MSN, FNP-BC  [facility]  [address]  [place], [place] [address]  [phone] Phone  [phone] Fax  [email or web address]

## 2020-12-20 ENCOUNTER — Ambulatory Visit: Admit: 2020-12-20 | Discharge: 2020-12-21 | Payer: PRIVATE HEALTH INSURANCE

## 2020-12-20 LAB — CBC W/ AUTO DIFF
BASOPHILS ABSOLUTE COUNT: 0.1 10*9/L (ref 0.0–0.1)
BASOPHILS RELATIVE PERCENT: 1.6 %
EOSINOPHILS ABSOLUTE COUNT: 0.3 10*9/L (ref 0.0–0.5)
EOSINOPHILS RELATIVE PERCENT: 3.5 %
HEMATOCRIT: 32.2 % — ABNORMAL LOW (ref 34.0–44.0)
HEMOGLOBIN: 10.8 g/dL — ABNORMAL LOW (ref 11.3–14.9)
LYMPHOCYTES ABSOLUTE COUNT: 1.6 10*9/L (ref 1.1–3.6)
LYMPHOCYTES RELATIVE PERCENT: 22.4 %
MEAN CORPUSCULAR HEMOGLOBIN CONC: 33.6 g/dL (ref 32.0–36.0)
MEAN CORPUSCULAR HEMOGLOBIN: 31.8 pg (ref 25.9–32.4)
MEAN CORPUSCULAR VOLUME: 94.9 fL (ref 77.6–95.7)
MEAN PLATELET VOLUME: 7.8 fL (ref 6.8–10.7)
MONOCYTES ABSOLUTE COUNT: 0.7 10*9/L (ref 0.3–0.8)
MONOCYTES RELATIVE PERCENT: 10 %
NEUTROPHILS ABSOLUTE COUNT: 4.5 10*9/L (ref 1.8–7.8)
NEUTROPHILS RELATIVE PERCENT: 62.5 %
PLATELET COUNT: 416 10*9/L (ref 150–450)
RED BLOOD CELL COUNT: 3.4 10*12/L — ABNORMAL LOW (ref 3.95–5.13)
RED CELL DISTRIBUTION WIDTH: 16.6 % — ABNORMAL HIGH (ref 12.2–15.2)
WBC ADJUSTED: 7.2 10*9/L (ref 3.6–11.2)

## 2020-12-20 MED ADMIN — diphenhydrAMINE (BENADRYL) injection 25 mg: 25 mg | INTRAVENOUS | @ 13:00:00 | Stop: 2020-12-20

## 2020-12-20 MED ADMIN — acetaminophen (TYLENOL) tablet 650 mg: 650 mg | ORAL | @ 13:00:00 | Stop: 2020-12-20

## 2020-12-20 MED ADMIN — methylPREDNISolone sodium succinate (PF) (Solu-MEDROL) injection 60 mg: 60 mg | INTRAVENOUS | @ 13:00:00 | Stop: 2020-12-20

## 2020-12-20 MED ADMIN — riTUXimab-abbs (TRUXIMA) 1,000 mg in sodium chloride (NS) 0.9 % 500 mL IVPB: 1000 mg | INTRAVENOUS | @ 14:00:00 | Stop: 2020-12-20

## 2020-12-20 NOTE — Unmapped (Signed)
Pt presents for Rituxan infusion.  Pt denies any recent infection, VSS.  IV placed, labs drawn, premeds administered.  Pt aware of potential reaction/side effects, call bell within reach.    WBC: 7.2  Platelets: 416    0937 Rituxan 1000mg  started at the following rates:    50mg /hr for 30 min  100mg /hr for 30 min  150mg /hr for 30 min  200mg /hr for 30 min  250mg /hr for 30 min  300mg /hr for 30 min  350mg /hr for 30 min  400mg /hr until complete    1031 Patient up to the bathroom.    1124 Patient sleeping    1300 Patient sleeping    1400 Infusion complete.     1430 Pt tolerated without complication, VSS.  IV flushed per policy.  IV d/c'd, gauze and coban applied.  Pt left clinic in no acute distress.

## 2020-12-21 DIAGNOSIS — D649 Anemia, unspecified: Principal | ICD-10-CM

## 2020-12-22 ENCOUNTER — Encounter: Admit: 2020-12-22 | Discharge: 2020-12-22 | Payer: PRIVATE HEALTH INSURANCE

## 2020-12-22 DIAGNOSIS — M06071 Rheumatoid arthritis without rheumatoid factor, right ankle and foot: Principal | ICD-10-CM

## 2020-12-22 DIAGNOSIS — M06072 Rheumatoid arthritis without rheumatoid factor, left ankle and foot: Principal | ICD-10-CM

## 2020-12-22 DIAGNOSIS — M0579 Rheumatoid arthritis with rheumatoid factor of multiple sites without organ or systems involvement: Principal | ICD-10-CM

## 2020-12-22 DIAGNOSIS — Z5181 Encounter for therapeutic drug level monitoring: Principal | ICD-10-CM

## 2020-12-22 DIAGNOSIS — F32A Depression, unspecified depression type: Principal | ICD-10-CM

## 2020-12-22 DIAGNOSIS — D849 Immunodeficiency, unspecified: Principal | ICD-10-CM

## 2020-12-22 DIAGNOSIS — M8580 Other specified disorders of bone density and structure, unspecified site: Principal | ICD-10-CM

## 2020-12-22 LAB — FERRITIN: FERRITIN: 68.5 ng/mL

## 2020-12-22 LAB — IRON & TIBC
IRON SATURATION: 7 % — ABNORMAL LOW (ref 20–55)
IRON: 23 ug/dL — ABNORMAL LOW
TOTAL IRON BINDING CAPACITY: 327 ug/dL (ref 250–425)

## 2020-12-22 LAB — COMPREHENSIVE METABOLIC PANEL
ALBUMIN: 3.7 g/dL (ref 3.4–5.0)
ALKALINE PHOSPHATASE: 96 U/L (ref 46–116)
ALT (SGPT): <7 U/L — ABNORMAL LOW (ref 10–49)
ANION GAP: 3 mmol/L — ABNORMAL LOW (ref 5–14)
AST (SGOT): 16 U/L (ref <=34)
BILIRUBIN TOTAL: 0.2 mg/dL — ABNORMAL LOW (ref 0.3–1.2)
BLOOD UREA NITROGEN: 18 mg/dL (ref 9–23)
BUN / CREAT RATIO: 19
CALCIUM: 9.3 mg/dL (ref 8.7–10.4)
CHLORIDE: 115 mmol/L — ABNORMAL HIGH (ref 98–107)
CO2: 24 mmol/L (ref 20.0–31.0)
CREATININE: 0.96 mg/dL — ABNORMAL HIGH
EGFR CKD-EPI (2021) FEMALE: 67 mL/min/{1.73_m2} (ref >=60)
GLUCOSE RANDOM: 81 mg/dL (ref 70–99)
POTASSIUM: 4.2 mmol/L (ref 3.4–4.8)
PROTEIN TOTAL: 8 g/dL (ref 5.7–8.2)
SODIUM: 142 mmol/L (ref 135–145)

## 2020-12-22 MED ORDER — HALOPERIDOL 0.5 MG TABLET
ORAL_TABLET | Freq: Every day | ORAL | 2 refills | 90 days
Start: 2020-12-22 — End: 2021-08-22

## 2020-12-22 NOTE — Unmapped (Unsigned)
Assessment/Plan:   Bethany Meyer is a 65 y.o. 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    1. Seropositive (+CCP) non erosive RA   Patient with adequate control of disease, she does note severe pain in her right thigh from the trochanteric femur fracture.   - continue  MTX 25mg  weekly subcutaneous  - Continue folic acid 1mg  daily  - Rituximab (last given (12/20/20) prior to that was 1g q14 days x2 doses with improvement in symptoms (12/30/19 and 01/13/20)), will plan to decrease to 1 g every 6 months     2.  Spinal stenosis  Patient with an MRI from 12/24/19 demonstrating severe spinal canal stenosis and foraminal narrowing at L4-5. Now with dramatic improvement after spinal surgery.     3.  Fall resulting in right proximal femur greater trochanteric fracture  Discussed with the patient obtaining a DEXA scan.  In the past she has had osteopenia however by FRAX calculation did not meet qualification for treatment.     4. Tobacco use  She is smoking at least a pack per day. She is now down to 6 a day and 2 at night.   -- smoking cessation encouraged -- we discussed nicotine patch and lozenge.     5. Normocytic anemia   --hemoglobin 10.8 from 12/20/20  -- add on iron and ferritin     Patient discussed with attending physician, Dr.Snyder    Health Care Maintenance:  Routine health maintenance discussed  Immunization History   Administered Date(s) Administered Comments   ??? COVID-19 VACC,MRNA,(PFIZER)(PF)(IM) 08/06/2019 [facility]   ???  08/27/2019 [facility]   ??? COVID-19 VACCINE,MRNA(MODERNA)(PF)(IM) 06/29/2019    ???  07/27/2019    ???  04/07/2020    ??? Hepatitis B, Adult 01/03/2015    ???  03/13/2015    ??? Influenza Vaccine Quad (IIV4 PF) 30mo+ injectable 02/10/2013    ???  05/03/2014    ???  03/13/2015    ???  03/20/2016    ???  07/12/2017    ???  03/11/2018    ???  02/14/2020    ??? Influenza Virus Vaccine, unspecified formulation 02/10/2013    ???  05/03/2014    ???  03/13/2015    ???  03/20/2016    ???  07/12/2017    ???  03/11/2018    ??? Influenza, High Dose (IIV4) 65 yrs & older 03/03/2019    ??? PNEUMOCOCCAL POLYSACCHARIDE 23 01/11/2015    ??? Pneumococcal conjugate -PCV7 02/18/2012    ??? TdaP 01/01/2011    Deferred Date(s) Deferred Comments   ??? Influenza Vaccine Quad (IIV4 PF) 38mo+ injectable 05/17/2019    ???  06/24/2019 Pt remember she had a dose already last year.  See immunization record.           -COVID-19: has had two (finished in March) and booster; ordered every shield and discussed with patient today    Diagnoses and all orders for this visit:  Rheumatoid arthritis involving both feet with negative rheumatoid factor (CMS-HCC)  - Iron Profile  - Ferritin  - Comprehensive Metabolic Panel  - 25 OH Vit D    Medication monitoring encounter  - Iron Profile  - Ferritin  - Comprehensive Metabolic Panel  - 25 OH Vit D    Osteopenia, unspecified location  - Dexa Bone Density Skeletal; Future    Immunosuppressed status (CMS-HCC)    Rheumatoid arthritis involving  multiple sites with positive rheumatoid factor (CMS-HCC)    I appreciate the opportunity to participate and collaborate in the care of this patient.      History of Present Illness:     Primary Care Provider: Karrie Doffing, PA    HPI:  Bethany Meyer is a 65 y.o.  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  ??  As background, Bethany Meyer's joint pain first started around May/June 2016 with right knee pain and swelling, then right foot and ankle swelling. She is RF negative, CCP positive. She was started on methotrexate but had persistent joint pain and swelling so was started on Humira in March 2017. She is on methotrexate 20mg  weekly po (have been unable to get SQ approved by insurance). ??She was increased to weekly Humira but continued to have joint pain and swelling. ??Enbrel was not approved by insurance so prescription was sent for [name] instead in 2018. [name] was initially effective but the patient experienced several flares while on this medication. This was discontinued in May 2019 and she was started on Cimzia in June 2019. Cimzia was discontinued in summer 2020 due to lack of perceived benefit from medication.    Last visit was fall 2021, since then she has been switched to Rasuvo for ease of injection.     She did back surgery on 5/27 approximately and she notes night and day difference!!!      She fell at home and fractured her hip on the right -- she had a displaced fracture of the proximal femur.  She has been told that she did not need a surgery and now will have a therapist that sees her. She does have a walker. She does have a full prong cane.     She notes morning stiffness lasting about an hour.     Balance of 10 systems was reviewed and is negative except for that mentioned in the HPI.    Review of records: I have reviewed labs/images/clinic notes per the computerized medical record.   X-ray: IMPRESSION:  -Subtle cortical irregularity at the second middle phalanx at the PIP joint, which is only visualized on oblique views and may be artifactual. Recommend correlation with point tenderness if fracture in this region is suspected.    Allergies:  Patient has no known allergies.    Medications:     Current Outpatient Medications:   ???  acetaminophen (TYLENOL) 325 MG tablet, Take 325 mg by mouth two (2) times a day as needed for pain., Disp: , Rfl:   ???  arm brace Misc, Boxer splint, metacarpal wrist brace, Disp: 1 each, Rfl: 0  ???  aspirin 81 MG chewable tablet, CHEW 1 TABLET BY MOUTH DAILY, Disp: 36 tablet, Rfl: 3  ???  buPROPion (WELLBUTRIN XL) 150 MG 24 hr tablet, Take 1 tablet (150 mg total) by mouth every morning., Disp: 90 tablet, Rfl: 3  ???  cefdinir (OMNICEF) 300 MG capsule, Take 1 capsule (300 mg total) by mouth every twelve (12) hours., Disp: 14 capsule, Rfl: 0  ???  cyclobenzaprine (FLEXERIL) 5 MG tablet, Take 5-10 mg by mouth nightly as needed., Disp: , Rfl:   ???  dicyclomine (BENTYL) 20 mg tablet, Take 1 tablet (20 mg total) by mouth four times a day as needed (stomach cramps)., Disp: 360 tablet, Rfl: 3  ???  empty container Misc, Use as directed, Disp: 1 each, Rfl: 2  ???  estradioL (ESTRACE) 0.01 % (0.1 mg/gram) vaginal cream, Insert 2 g into the vagina Two (2) times a week. Insert 2 g inside vagina and apply a pea-sized amount around urethra twice weekly, Disp: , Rfl: 0  ???  folic acid (FOLVITE) 1 MG tablet, Take 1 tablet (1 mg total) by mouth daily., Disp: 100 tablet, Rfl: 3  ???  gabapentin (NEURONTIN) 300 MG capsule, Take 3 capsules (900 mg total) by mouth Three times a day., Disp: 270 capsule, Rfl: 3  ???  hydrOXYzine (ATARAX) 10 MG tablet, Take 1 tablet (10 mg total) by mouth Three times a day as needed for itching., Disp: 90 tablet, Rfl: 1  ???  leg brace (KNEE SUPPORT BRACE) Misc, 1 each by Miscellaneous route daily as needed., Disp: 1 each, Rfl: 0  ???  methocarbamoL (ROBAXIN) 500 MG tablet, Take 1 tablet (500 mg total) by mouth Three times a day., Disp: 90 tablet, Rfl: 1  ???  methotrexate, PF, (OTREXUP, PF,) 25 mg/0.4 mL [name], Inject the contents of 1 pen (25mg ) under the skin every 7 days, Disp: 4.8 mL, Rfl: 1  ???  needle, disp, 25 gauge 25 gauge x 5/8 Ndle, For methotrexate injections, Disp: 100 each, Rfl: 0  ???  nicotine (NICODERM CQ) 14 mg/24 hr patch, Place 1 patch on the skin daily., Disp: 28 patch, Rfl: 1  ???  pantoprazole (PROTONIX) 40 MG tablet, Take 1 tablet (40 mg total) by mouth daily., Disp: 90 tablet, Rfl: 3  ???  sucralfate (CARAFATE) 100 mg/mL suspension, Take 10 mL (1 g total) by mouth Four times a day (before meals and nightly)., Disp: 420 mL, Rfl: 11  ???  traMADoL (ULTRAM) 50 mg tablet, Take 1 tablet (50 mg total) by mouth every six (6) hours as needed for pain for up to 10 doses., Disp: 10 tablet, Rfl: 0  ???  VITAMIN B-1 100 mg tablet, Take 1 tablet (100 mg total) by mouth daily., Disp: 100 tablet, Rfl: 3  ???  haloperidoL (HALDOL) 0.5 MG tablet, Take 1 tablet (0.5 mg total) by mouth every morning before breakfast., Disp: 90 tablet, Rfl: 3  ???  mirtazapine (REMERON) 15 MG tablet, Take 2 tablets (30 mg total) by mouth nightly., Disp: 180 tablet, Rfl: 1    Medical History:  Past Medical History:   Diagnosis Date   ??? Arthritis     RA   ??? Cataract    ??? Functional dyspepsia    ??? GERD (gastroesophageal reflux disease)    ??? Gout    ??? RA (rheumatoid arthritis) (CMS-HCC)    ??? Smoking    ??? Stage 1 mild COPD by GOLD classification (CMS-HCC) 02/25/2017       Surgical History:  Past Surgical History:   Procedure Laterality Date   ??? ovarian cyst removed     ??? OVARY SURGERY     ??? PR COLONOSCOPY FLX DX W/COLLJ SPEC WHEN PFRMD N/A 03/05/2013    Procedure: COLONOSCOPY, FLEXIBLE, PROXIMAL TO SPLENIC FLEXURE; DIAGNOSTIC, W/WO COLLECTION SPECIMEN BY BRUSH OR WASH;  Surgeon: Trudie Buckler, MD;  Location: GI PROCEDURES [facility];  Service: Gastroenterology   ??? PR COMPRE EP EVAL ABLTJ 3D MAPG TX SVT N/A 12/26/2015    Procedure: A-Flutter Ablation;  Surgeon: Theresia Majors, MD;  Location: [facility] EP;  Service: Cardiology   ??? PR UP GI ENDOSCOPY,BALL DIL,30MM N/A 06/03/2019    Procedure: UGI ENDO; W/BALLOON DILAT ESOPHAGUS (<30MM DIAM);  Surgeon: Neysa Hotter, MD;  Location: [facility]  PROCEDURES [facility];  Service: Gastroenterology   ??? PR UPPER GI ENDOSCOPY,BIOPSY N/A 09/16/2012    Procedure: UGI ENDOSCOPY; WITH BIOPSY, SINGLE OR MULTIPLE;  Surgeon: Brown Human, MD;  [facility];  Service: Gastroenterology   ??? PR UPPER GI ENDOSCOPY,BIOPSY N/A 06/03/2019    Procedure: UGI ENDOSCOPY; WITH BIOPSY, SINGLE OR MULTIPLE;  Surgeon: Neysa Hotter, MD;  Location: [facility]ED[facility];  Service: Gastroenterology   ??? PR UPPER GI ENDOSCOPY,DIAGNOSIS N/A 06/03/2019    Procedure: UGI ENDO, INCLUDE ESOPHAGUS, STOMACH, & DUODENUM &/OR JEJUNUM; DX W/WO COLLECTION SPECIMN, BY BRUSH OR WASH;  Surgeon: Neysa Hotter, MD;  Location: [facility] PROCEDURES [facility];  Service: Gastroenterology       Social History:  Social History     Tobacco Use   ??? Smoking status: Current Every Day Smoker     Packs/day: 1.00     Years: 35.00     Pack years: 35.00     Types: Cigarettes   ??? Smokeless tobacco: Never Used   ??? Tobacco comment: Started smoking at 33,    Vaping Use   ??? Vaping Use: Some days   Substance Use Topics   ??? Alcohol use: No     Alcohol/week: 0.0 standard drinks   ??? Drug use: Yes     Types: Marijuana     Comment: once a month       Family History:  Family History   Problem Relation Age of Onset   ??? Diabetes Mother         type 2   ??? Breast cancer Mother    ??? Diabetes Father         type 2   ??? Cataracts Father    ??? Macular degeneration Neg Hx    ??? Retinal detachment Neg Hx    ??? Colon cancer Neg Hx    ??? Endometrial cancer Neg Hx    ??? Ovarian cancer Neg Hx    ??? Glaucoma Neg Hx    ??? Blindness Neg Hx            Objective   Vitals:    12/22/20 1316   BP: 126/66   BP Site: L Arm   BP Position: Sitting   BP Cuff Size: Medium   Pulse: 77   Temp: 37 ??C (98.6 ??F)   TempSrc: Temporal   Weight: 52.8 kg (116 lb 6.4 oz)       Physical Exam:  General: Well-appearing.  HEENT: Sclera anicteric;   Pulmonary: Unremarkable respiratory effort.   Neuro: Moving all four extremities, able to walk to the exam table independently although with a limp. Psych: Euthymic affect.  MSK:     ??? Hands: No swelling or tenderness to palpation of the MCPs, PIPs, or DIPs.  ??? Wrists: No swelling or tenderness to palpation of the wrists or deficit in range of motion.  ??? Elbows: No swelling or tenderness to palpation of the elbows.  ??? Shoulders: No swelling or tenderness to palpation of the bilateral shoulders.Marland Kitchen  ??? Hips: Right lateral hip tenderness -- did not assess ROM.   ??? Knees: No swelling or tenderness to palpation of the bilateral knees.  ??? Ankles: No swelling or tenderness to palpation of the bilateral ankles.  ??? Feet: No swelling or tenderness to palpation of the ankle or MTPs.  Skin: No evidence of erythema or rash.  Extremities: Warm, no LE edema.    Test Results  Lab Results  Component Value Date    ANA Negative 11/30/2014       Lab Results   Component Value Date    CKTOTAL 208.0 (H) 02/14/2020    CRP <4.0 06/19/2020    ESR 55 (H) 06/19/2020       Lab Results   Component Value Date    CREATUR 180.6 07/02/2019    PROTEINUR <4.0 08/27/2019    PCRATIOUR 0.087 07/02/2019       Lab Results   Component Value Date    HBSAG Nonreactive 12/23/2014    HEPBSAB Reactive (A) 06/23/2019    QFTTBGOLD Negative 10/01/2017    HEPCAB Nonreactive 06/23/2019       Lab Results   Component Value Date    NITRITE Negative 08/26/2020    PROTEINUA Negative 08/26/2020    RBCUA 0 08/26/2020    WBC 7.2 12/20/2020    BLOODU Negative 08/26/2020    KETONESU Trace (A) 08/26/2020 07/02/2019       No results found for: EJO1, ERNP, ESCL, ESMA, ESSA, ESSB    Lab Results   Component Value Date    HBSAG Nonreactive 12/23/2014    HEPBSAB Reactive (A) 06/23/2019    QFTTBGOLD Negative 10/01/2017    HEPCAB Nonreactive 06/23/2019       Lab Results   Component Value Date    NITRITE Negative 08/26/2020    PROTEINUA Negative 08/26/2020    RBCUA 0 08/26/2020    WBC 7.2 12/20/2020    BLOODU Negative 08/26/2020    KETONESU Trace (A) 08/26/2020       No results found for: ANCA, ANCAIFA, MPO, MPOQT, PR3ELISA

## 2020-12-22 NOTE — Unmapped (Signed)
Dear Bethany Meyer    It was nice to see you!     Today in clinic we discussed the following:     We are going to get blood work today   Cancel your 8/17 infusion appointment -- we are going to try treating you with Rituximab but at a lower dose.   DEXA scan to make sure that you do not have osteoporosis.     Please know you can reach out to me if you have any issues getting a medication (availability or cost) that I prescribe.    MyChart messages may take up to 48-72 hours for response and should not be used in an emergency.     Have a nice day!     Johnanna Schneiders MD   [facility]  [address]  [place], [place] [address]  Phone: [phone]  Fax: [phone]

## 2020-12-22 NOTE — Unmapped (Signed)
Pharmacy requesting refill.

## 2020-12-24 NOTE — Unmapped (Signed)
Error

## 2020-12-25 NOTE — Unmapped (Signed)
Per Dr. Tomasa Rand  Nov 7th at 11:30AM or 4PM or Nov 21st at 11:30AM or 4PM  ck to see which is still available before scheduling.mailbox full

## 2020-12-26 ENCOUNTER — Encounter: Admit: 2020-12-26 | Discharge: 2020-12-27 | Payer: PRIVATE HEALTH INSURANCE

## 2020-12-26 ENCOUNTER — Ambulatory Visit: Admit: 2020-12-26 | Discharge: 2020-12-27 | Payer: PRIVATE HEALTH INSURANCE

## 2020-12-26 LAB — VITAMIN D 25 HYDROXY: VITAMIN D, TOTAL (25OH): 27.9 ng/mL (ref 20.0–80.0)

## 2020-12-26 NOTE — Unmapped (Signed)
ORTHOPAEDIC NOTE     Bethany Meyer    MRN: [id number]  DOB: 1956/04/30    Date of visit: 12/26/2020    Clinic location: [place]     ASSESSMENT:     Stable right greater trochanteric fracture sustained on 12/12/2020     PLAN:     Patient understands that her fracture is stable amenable to nonoperative treatment  Prescription for formal physical therapy was provided  She will continue with walker until follow-up avoiding abduction and extension of the hip  -Advised OTC analgesic PRN pain  -Discussed treatment options and patient was amenable to the above plan and was instructed to call and be seen if there is any increasing pain or concerns.     Follow up: 4 weeks, AP of the pelvis lateral of the right hip       Chief Complaint:     Right hip injury     SUBJECTIVE:     HPI: Bethany Meyer is a  65 y.o. with a PMHx RA, GERD, gout, COPD, chronic pain under the care of of pain management, recently status post lumbar surgery presenting to Clinic for evaluation of right hip injury sustained on 12/12/2020 when she fell from a ground-level height.  She presented to [facility] emergency department x-rays and CT scan of the right hip showed minimally displaced comminuted right greater trochanteric fracture.  She has been weightbearing as tolerated using a walker.  She has pain in the evening time when she tries to lay on her hip.       Allergies  No Known Allergies       Past Medical History  Past Medical History:   Diagnosis Date   ??? Arthritis     RA   ??? Cataract    ??? Functional dyspepsia    ??? GERD (gastroesophageal reflux disease)    ??? Gout    ??? RA (rheumatoid arthritis) (CMS-HCC)    ??? Smoking    ??? Stage 1 mild COPD by GOLD classification (CMS-HCC) 02/25/2017          PHYSICAL EXAM:     MSK: Right hip  Inspection: Edema and ecchymosis along the greater trochanter  Palpation: Tenderness on the greater trochanter  ROM: Full range of motion of the hip pain with external rotation  Strength: Adequate strength each plane of range of motion of the hip  normal sensation RLE  Dorsalis pedal pulses easily palpable      Imaging   Two views of the right Hip independently reviewed and interpreted by myself show Minimally displaced right greater trochanteric fracture with incompletely visualized lumbar hardware and what appears to be spine stimulator. No other obvious fractures, lucencies, dislocations, or abnormalities.    MEDICAL DECISION MAKING (level of service defined by 2/3 elements)     Number/Complexity of Problems Addressed 1 acute, uncomplicated illness or injury (99203/99213)   Amount/Complexity of Data to be Reviewed/Analyzed Independent interpretation of a test performed by another physician/other qualified health care professional (99204/99214)   Risk of Complications/Morbidity/Mortality of Management Closed Fracture Treatment WITHOUT Manipulation (99204/99214)       cc:  Karrie Doffing, PA  *Patient note was created using Dragon Dictation sotware. Errors in syntax or grammar may not have been identified and edited on initial review.

## 2020-12-27 DIAGNOSIS — F32A Depression, unspecified depression type: Principal | ICD-10-CM

## 2020-12-27 DIAGNOSIS — M06071 Rheumatoid arthritis without rheumatoid factor, right ankle and foot: Principal | ICD-10-CM

## 2020-12-27 DIAGNOSIS — M06072 Rheumatoid arthritis without rheumatoid factor, left ankle and foot: Principal | ICD-10-CM

## 2020-12-27 MED ORDER — PREDNISONE 5 MG TABLET
ORAL_TABLET | ORAL | 0 refills | 21 days | Status: CP
Start: 2020-12-27 — End: 2021-01-17

## 2020-12-27 MED ORDER — HALOPERIDOL 0.5 MG TABLET
ORAL_TABLET | Freq: Every day | ORAL | 2 refills | 90.00000 days | Status: CP
Start: 2020-12-27 — End: 2021-08-27

## 2020-12-27 NOTE — Unmapped (Signed)
Reason for call: PT called in stating that she was seen on Friday and that Dr. Blase Mess advised her to call us if her joint pain increased. PT is requesting for rx for joint to please be sent in for her.     Thanks       Last ov: 12/22/2020  Next ov: 12/28/2020

## 2020-12-27 NOTE — Unmapped (Signed)
Addended by: Johnanna Schneiders R on: 12/27/2020 12:12 PM     Modules accepted: Orders

## 2020-12-27 NOTE — Unmapped (Signed)
She notes that on Saturday she usually takes the methotrexate on Saturday. She notes that she has had a lot more pain in her hands and in her ankles along with her elbows. No swelling of her hands. She notes pain in her elbows. She has not noted any fevers or chills. No radiating pain in her hands.     She notes that she is also having some pain at her right hip although more recently with has been limiting her mobility has been the pain in her hands.  No precipitating factor that she can think of.    I discussed with her that the multiple pains that she is having in her joints could be consistent with a flare of her rheumatoid arthritis although it sounds that these joints are not also swelling.  We discussed a small trial of prednisone to see if this improves her symptoms.  I do realize that she has a recent fracture so I am going to use the shortest amount possible and let her know to keep me closely informed as to if this has helped.    We also discussed that should she notice increased joint swelling or fevers that she should seek medical evaluation as this could represent an infection.     She expressed understanding.

## 2020-12-28 ENCOUNTER — Encounter: Admit: 2020-12-28 | Payer: PRIVATE HEALTH INSURANCE

## 2020-12-29 DIAGNOSIS — F32A Depression, unspecified depression type: Principal | ICD-10-CM

## 2020-12-29 MED ORDER — BUPROPION HCL XL 150 MG 24 HR TABLET, EXTENDED RELEASE
ORAL_TABLET | Freq: Every morning | ORAL | 2 refills | 90 days | Status: CP
Start: 2020-12-29 — End: 2021-09-16

## 2021-01-02 NOTE — Unmapped (Signed)
[facility] Clinical Assessment & Refill Coordination Note    Patient has 4 autoinjectors of Otrexup left over from back surgery in May, denies any missed dosing.    She is doing much better from recent prednisone.  My hands and everything are so much better.      Patient mentioned joints are tight at night and she has trouble getting up in the morning until she soaks her joints in hot water.  She states mirtazapine is not helping with her pain at night. Explained mirtazepine is more likely for anxiety/insomnia vs treating her joint pain     Bethany Meyer, DOB: Apr 06, 1956  Phone: [phone] (home)     All above HIPAA information was verified with patient.     Was a Nurse, learning disability used for this call? No    Specialty Medication(s):   Inflammatory Disorders: Otrexup     Current Outpatient Medications   Medication Sig Dispense Refill   ??? acetaminophen (TYLENOL) 325 MG tablet Take 325 mg by mouth two (2) times a day as needed for pain.     ??? arm brace Misc Boxer splint, metacarpal wrist brace 1 each 0   ??? aspirin 81 MG chewable tablet CHEW 1 TABLET BY MOUTH DAILY 36 tablet 3   ??? buPROPion (WELLBUTRIN XL) 150 MG 24 hr tablet Take 1 tablet (150 mg total) by mouth every morning. 90 tablet 2   ??? cefdinir (OMNICEF) 300 MG capsule Take 1 capsule (300 mg total) by mouth every twelve (12) hours. 14 capsule 0   ??? cyclobenzaprine (FLEXERIL) 5 MG tablet Take 5-10 mg by mouth nightly as needed.     ??? dicyclomine (BENTYL) 20 mg tablet Take 1 tablet (20 mg total) by mouth four times a day as needed (stomach cramps). 360 tablet 3   ??? empty container Misc Use as directed 1 each 2   ??? estradioL (ESTRACE) 0.01 % (0.1 mg/gram) vaginal cream Insert 2 g into the vagina Two (2) times a week. Insert 2 g inside vagina and apply a pea-sized amount around urethra twice weekly  0   ??? folic acid (FOLVITE) 1 MG tablet Take 1 tablet (1 mg total) by mouth daily. 100 tablet 3   ??? gabapentin (NEURONTIN) 300 MG capsule Take 3 capsules (900 mg total) by mouth Three times a day. 270 capsule 3   ??? haloperidoL (HALDOL) 0.5 MG tablet Take 1 tablet (0.5 mg total) by mouth daily before breakfast. 90 tablet 3   ??? hydrOXYzine (ATARAX) 10 MG tablet Take 1 tablet (10 mg total) by mouth Three times a day as needed for itching. 90 tablet 1   ??? leg brace (KNEE SUPPORT BRACE) Misc 1 each by Miscellaneous route daily as needed. 1 each 0   ??? methocarbamoL (ROBAXIN) 500 MG tablet Take 1 tablet (500 mg total) by mouth Three times a day. 90 tablet 1   ??? methotrexate, PF, (OTREXUP, PF,) 25 mg/0.4 mL [name] Inject the contents of 1 pen (25mg ) under the skin every 7 days 4.8 mL 1   ??? mirtazapine (REMERON) 15 MG tablet Take 2 tablets (30 mg total) by mouth nightly. 180 tablet 1   ??? needle, disp, 25 gauge 25 gauge x 5/8 Ndle For methotrexate injections 100 each 0   ??? nicotine (NICODERM CQ) 14 mg/24 hr patch Place 1 patch on the skin daily. 28 patch 1   ??? pantoprazole (PROTONIX) 40 MG tablet Take 1 tablet (40 mg total) by mouth daily. 90  tablet 3   ??? predniSONE (DELTASONE) 5 MG tablet Take 2 tablets (10 mg total) by mouth daily for 7 days, THEN 1.5 tablets (7.5 mg total) daily for 7 days, THEN 1 tablet (5 mg total) daily for 7 days. 32 tablet 0   ??? sucralfate (CARAFATE) 100 mg/mL suspension Take 10 mL (1 g total) by mouth Four times a day (before meals and nightly). 420 mL 11   ??? traMADoL (ULTRAM) 50 mg tablet Take 1 tablet (50 mg total) by mouth every six (6) hours as needed for pain for up to 10 doses. 10 tablet 0   ??? VITAMIN B-1 100 mg tablet Take 1 tablet (100 mg total) by mouth daily. 100 tablet 3     No current facility-administered medications for this visit.        Changes to medications: Bethany Meyer reports no changes at this time.    No Known Allergies    Changes to allergies: No    SPECIALTY MEDICATION ADHERENCE       Medication Adherence    Specialty Medication: Otrexup 25mg /0.20ml Q 7d          Specialty medication(s) dose(s) confirmed: Regimen is correct and unchanged.     Are there any concerns with adherence? No    Adherence counseling provided? Not needed    CLINICAL MANAGEMENT AND INTERVENTION      Clinical Benefit Assessment:    Do you feel the medicine is effective or helping your condition? Had recent flare.  Joints are tight at night and has trouble getting up in the AM until she gets in boiling hot water    Clinical Benefit counseling provided? Progress note from 12/22/20 shows evidence of clinical benefit    Adverse Effects Assessment:    Are you experiencing any side effects? No    Are you experiencing difficulty administering your medicine? No    Quality of Life Assessment:    Quality of Life    Rheumatology  On a scale of 1 - 10 with 1 representing not at all and 10 representing completely - how has your rheumatologic condition affected your:  Daily pain level?: 6  Ability to complete your regular daily tasks (prepare meals, get dressed, etc.)?: 8  Ability to participate in social or family activities?: 8  Oncology  Dermatology              Have you discussed this with your provider? Yes    Acute Infection Status:    Acute infections noted within Epic:  No active infections  Patient reported infection: None    Therapy Appropriateness:    Is therapy appropriate? Yes, therapy is appropriate and should be continued    DISEASE/MEDICATION-SPECIFIC INFORMATION      For patients on injectable medications: Patient currently has 4 doses left.  Next injection is scheduled for 8/21.    PATIENT SPECIFIC NEEDS     - Does the patient have any physical, cognitive, or cultural barriers? No    - Is the patient high risk? No    - Does the patient require a Care Management Plan? No     - Does the patient require physician intervention or other additional services (i.e. nutrition, smoking cessation, social work)? No      SHIPPING     Specialty Medication(s) to be Shipped:   Inflammatory Disorders: Otrexup    Other medication(s) to be shipped: No additional medications requested for fill at this time     Changes to insurance: No  Delivery Scheduled: Yes, Expected medication delivery date: 8/30.     Medication will be delivered via UPS to the confirmed prescription address in [facility].    The patient will receive a drug information handout for each medication shipped and additional FDA Medication Guides as required.  Verified that patient has previously received a Conservation officer, historic buildings and a Surveyor, mining.    The patient or caregiver noted above participated in the development of this care plan and knows that they can request review of or adjustments to the care plan at any time.      All of the patient's questions and concerns have been addressed.    Julianne Rice   [facility] Specialty Pharmacist

## 2021-01-15 MED FILL — OTREXUP (PF) 25 MG/0.4 ML SUBCUTANEOUS AUTO-INJECTOR: SUBCUTANEOUS | 28 days supply | Qty: 1.6 | Fill #1

## 2021-02-09 MED FILL — GABAPENTIN 300 MG CAPSULE: ORAL | 30 days supply | Qty: 270 | Fill #3

## 2021-02-09 MED FILL — DICYCLOMINE 20 MG TABLET: ORAL | 25 days supply | Qty: 100 | Fill #4

## 2021-02-19 MED ORDER — FERROUS SULFATE 325 MG (65 MG IRON) TABLET,DELAYED RELEASE
ORAL_TABLET | ORAL | 3 refills | 90 days | Status: CP
Start: 2021-02-19 — End: 2022-02-19

## 2021-02-19 NOTE — Unmapped (Signed)
Addended by: Deatra James on: 02/19/2021 04:34 PM     Modules accepted: Orders

## 2021-02-19 NOTE — Unmapped (Signed)
Spoke with pt and advised she can get iron rx or use OTC. She verbalized understanding.

## 2021-02-19 NOTE — Unmapped (Signed)
Spoke with pt about unread [employer].     She reports she does not take an iron supplement and she will schedule her colonoscopy once she is off workers comp.

## 2021-02-20 NOTE — Unmapped (Signed)
[facility] Refill Coordination Note    Specialty Medication(s) to be Shipped:   Inflammatory Disorders: Otrexup    Other medication(s) to be shipped: No additional medications requested for fill at this time     Bethany Meyer, DOB: 12-27-1955  Phone: [phone] (home)       All above HIPAA information was verified with patient.     Was a Nurse, learning disability used for this call? No    Completed refill call assessment today to schedule patient's medication shipment from the [facility] [phone]).  All relevant notes have been reviewed.     Specialty medication(s) and dose(s) confirmed: Regimen is correct and unchanged.   Changes to medications: [facility] reports no changes at this time.  Changes to insurance: No  New side effects reported not previously addressed with a pharmacist or physician: None reported  Questions for the pharmacist: No    Confirmed patient received a Conservation officer, historic buildings and a Surveyor, mining with first shipment. The patient will receive a drug information handout for each medication shipped and additional FDA Medication Guides as required.       DISEASE/MEDICATION-SPECIFIC INFORMATION        For patients on injectable medications: Patient currently has 2 doses left.  Next injection is scheduled for 10/5.    SPECIALTY MEDICATION ADHERENCE     Medication Adherence    Patient reported X missed doses in the last month: 0  Specialty Medication: Otrexup  Patient is on additional specialty medications: No  Patient is on more than two specialty medications: No  Any gaps in refill history greater than 2 weeks in the last 3 months: no  Demonstrates understanding of importance of adherence: yes  Informant: patient              Were doses missed due to medication being on hold? No    Otrexup 25mg /0.65ml: Patient has 14 days of medication on hand     REFERRAL TO PHARMACIST     Referral to the pharmacist: Not needed      [facility]     Shipping address confirmed in Epic. Delivery Scheduled: Yes, Expected medication delivery date: 10/12.     Medication will be delivered via UPS to the prescription address in Epic [facility].    Olga Millers   [facility] Specialty Technician

## 2021-02-27 MED FILL — OTREXUP (PF) 25 MG/0.4 ML SUBCUTANEOUS AUTO-INJECTOR: SUBCUTANEOUS | 28 days supply | Qty: 1.6 | Fill #2

## 2021-03-12 ENCOUNTER — Ambulatory Visit: Admit: 2021-03-12 | Discharge: 2021-03-13 | Payer: PRIVATE HEALTH INSURANCE

## 2021-03-15 ENCOUNTER — Ambulatory Visit: Admit: 2021-03-15 | Discharge: 2021-03-16 | Payer: PRIVATE HEALTH INSURANCE

## 2021-03-15 DIAGNOSIS — H524 Presbyopia: Principal | ICD-10-CM

## 2021-03-15 DIAGNOSIS — H5213 Myopia, bilateral: Principal | ICD-10-CM

## 2021-03-15 DIAGNOSIS — M1990 Unspecified osteoarthritis, unspecified site: Principal | ICD-10-CM

## 2021-03-15 DIAGNOSIS — H52203 Unspecified astigmatism, bilateral: Principal | ICD-10-CM

## 2021-03-15 DIAGNOSIS — Z79899 Other long term (current) drug therapy: Principal | ICD-10-CM

## 2021-03-15 DIAGNOSIS — H2513 Age-related nuclear cataract, bilateral: Principal | ICD-10-CM

## 2021-03-15 NOTE — Unmapped (Signed)
1) PLAQUENIL 200MG  BID 2015-2020 for RA. No signs of toxicity. On Methotx now. Ok to stop screening.  2) syneresis OU- stable.  3) cataract- not visually significant. Observe.    INTERPRETATION EXTENDED OPHTHALMOSCOPT MACULA (90Dlens)  Macula - right:  syneresis  Macula - left:  Syneresis    rtc prn    Karrie Doffing, PA cc

## 2021-03-28 DIAGNOSIS — K21 Gastroesophageal reflux disease with esophagitis without hemorrhage: Principal | ICD-10-CM

## 2021-03-28 DIAGNOSIS — M5431 Sciatica, right side: Principal | ICD-10-CM

## 2021-03-28 MED ORDER — GABAPENTIN 300 MG CAPSULE
ORAL_CAPSULE | Freq: Three times a day (TID) | ORAL | 3 refills | 30.00000 days | Status: CP
Start: 2021-03-28 — End: ?
  Filled 2021-04-05: qty 270, 30d supply, fill #0

## 2021-03-28 MED ORDER — PANTOPRAZOLE 40 MG TABLET,DELAYED RELEASE
ORAL_TABLET | Freq: Every day | ORAL | 3 refills | 90.00000 days | Status: CP
Start: 2021-03-28 — End: ?

## 2021-03-28 MED ORDER — MIRTAZAPINE 30 MG TABLET
ORAL_TABLET | Freq: Every evening | ORAL | 2 refills | 180 days | Status: CP
Start: 2021-03-28 — End: 2022-03-28

## 2021-03-30 NOTE — Unmapped (Signed)
The [facility] has made a second and final attempt to reach this patient to refill the following medication:Otrexup.      We have been unable to leave messages on the following phone numbers: [phone] and have sent a MyChart message.    Dates contacted: 11/4, 11/11  Last scheduled delivery: 10/11    The patient may be at risk of non-compliance with this medication. The patient should call the [facility] at [phone]  Option 4, then Option 2 (all other specialty patients) to refill medication.    Olga Millers   [facility] Specialty Technician

## 2021-04-05 MED FILL — DICYCLOMINE 20 MG TABLET: ORAL | 25 days supply | Qty: 100 | Fill #5

## 2021-04-09 ENCOUNTER — Ambulatory Visit
Admit: 2021-04-09 | Discharge: 2021-04-09 | Payer: PRIVATE HEALTH INSURANCE | Attending: Student in an Organized Health Care Education/Training Program | Primary: Student in an Organized Health Care Education/Training Program

## 2021-04-09 ENCOUNTER — Ambulatory Visit: Admit: 2021-04-09 | Discharge: 2021-04-09 | Payer: PRIVATE HEALTH INSURANCE

## 2021-04-09 DIAGNOSIS — M0579 Rheumatoid arthritis with rheumatoid factor of multiple sites without organ or systems involvement: Principal | ICD-10-CM

## 2021-04-09 DIAGNOSIS — Z79631 Methotrexate, long term, current use: Principal | ICD-10-CM

## 2021-04-09 DIAGNOSIS — M059 Rheumatoid arthritis with rheumatoid factor, unspecified: Principal | ICD-10-CM

## 2021-04-09 LAB — CBC W/ AUTO DIFF
BASOPHILS ABSOLUTE COUNT: 0.1 10*9/L (ref 0.0–0.1)
BASOPHILS RELATIVE PERCENT: 1.2 %
EOSINOPHILS ABSOLUTE COUNT: 0.2 10*9/L (ref 0.0–0.5)
EOSINOPHILS RELATIVE PERCENT: 3.2 %
HEMATOCRIT: 37.2 % (ref 34.0–44.0)
HEMOGLOBIN: 12.4 g/dL (ref 11.3–14.9)
LYMPHOCYTES ABSOLUTE COUNT: 1.8 10*9/L (ref 1.1–3.6)
LYMPHOCYTES RELATIVE PERCENT: 28.5 %
MEAN CORPUSCULAR HEMOGLOBIN CONC: 33.2 g/dL (ref 32.0–36.0)
MEAN CORPUSCULAR HEMOGLOBIN: 33.3 pg — ABNORMAL HIGH (ref 25.9–32.4)
MEAN CORPUSCULAR VOLUME: 100.3 fL — ABNORMAL HIGH (ref 77.6–95.7)
MEAN PLATELET VOLUME: 7.2 fL (ref 6.8–10.7)
MONOCYTES ABSOLUTE COUNT: 0.5 10*9/L (ref 0.3–0.8)
MONOCYTES RELATIVE PERCENT: 7.8 %
NEUTROPHILS ABSOLUTE COUNT: 3.8 10*9/L (ref 1.8–7.8)
NEUTROPHILS RELATIVE PERCENT: 59.3 %
NUCLEATED RED BLOOD CELLS: 0 /100{WBCs} (ref <=4)
PLATELET COUNT: 320 10*9/L (ref 150–450)
RED BLOOD CELL COUNT: 3.7 10*12/L — ABNORMAL LOW (ref 3.95–5.13)
RED CELL DISTRIBUTION WIDTH: 20.3 % — ABNORMAL HIGH (ref 12.2–15.2)
WBC ADJUSTED: 6.4 10*9/L (ref 3.6–11.2)

## 2021-04-09 LAB — AST: AST (SGOT): 36 U/L — ABNORMAL HIGH (ref <=34)

## 2021-04-09 LAB — CREATININE
CREATININE: 0.97 mg/dL — ABNORMAL HIGH
EGFR CKD-EPI (2021) FEMALE: 65 mL/min/{1.73_m2} (ref >=60)

## 2021-04-09 LAB — SLIDE REVIEW

## 2021-04-09 LAB — ALT: ALT (SGPT): 21 U/L (ref 10–49)

## 2021-04-09 MED ORDER — ALENDRONATE 70 MG TABLET
ORAL_TABLET | ORAL | 11 refills | 28.00000 days | Status: CP
Start: 2021-04-09 — End: 2022-04-09

## 2021-04-10 NOTE — Unmapped (Signed)
Assessment/Plan:   Bethany Meyer is a 65 y.o. 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    1. Seropositive (+CCP) non erosive RA   Disease appears controlled today on Ritux and MTX. 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 Will plan to continue as below.  - continue  MTX 25mg  weekly subcutaneous  - Continue folic acid 1mg  daily  - Rituximab (last given (12/20/20) prior to that was 1g q14 days x2 doses with improvement in symptoms (12/30/19 and 01/13/20)), will plan to continue 1 g every 6 months (next due 06/2021)    2.  Spinal stenosis  Patient with an MRI from 12/24/19 demonstrating severe spinal canal stenosis and foraminal narrowing at L4-5. Now with dramatic improvement after spinal surgery.     3.  Osteoporosis - Fall resulting in right proximal femur greater trochanteric fracture  Dexa 02/2021 with left hip T score -2.7 c/w osteoporosis. Discussed therapeutic options today, will plan to start Fosamax. Reviewed dosing, side effect profile, and administration instructions. Vit D and Ca wnl.  - Start Fosamax qweekly    4. Tobacco use  She was smoking at least a pack per day, now down to ~8/day.   -- smoking cessation encouraged    5. Normocytic anemia   --hemoglobin 10.8 from 12/20/20  --iron 68.5 (12/2020)    Patient discussed with attending physician, Dr. Lorre Munroe    Health Care Maintenance:  Routine health maintenance discussed  Immunization History   Administered Date(s) Administered Comments   ??? COVID-19 VACC,MRNA,(PFIZER)(PF) 08/06/2019 [facility]   ???  08/27/2019 [facility]   ??? COVID-19 VACCINE,MRNA(MODERNA)(PF) 06/29/2019    ???  07/27/2019    ???  04/07/2020    ??? Hepatitis B, Adult 01/03/2015    ???  03/13/2015    ??? INFLUENZA QUAD HIGH DOSE 76YRS+(FLUZONE) 03/03/2019    ??? Influenza Vaccine Quad (IIV4 PF) 49mo+ injectable 02/10/2013    ???  05/03/2014    ???  03/13/2015    ???  03/20/2016    ???  07/12/2017    ???  03/11/2018    ???  02/14/2020    ??? Influenza Virus Vaccine, unspecified formulation 02/10/2013    ???  05/03/2014    ???  03/13/2015    ???  03/20/2016    ???  07/12/2017    ???  03/11/2018    ??? PNEUMOCOCCAL POLYSACCHARIDE 23 01/11/2015    ??? Pneumococcal conjugate -PCV7 02/18/2012    ??? TdaP 01/01/2011    Deferred Date(s) Deferred Comments   ??? Influenza Vaccine Quad (IIV4 PF) 56mo+ injectable 05/17/2019    ???  06/24/2019 Pt remember she had a dose already last year.  See immunization record.           -COVID-19: has had two (finished in March) and booster; ordered every shield and discussed with patient today    Diagnoses and all orders for this visit:  There are no diagnoses linked to this encounter.  I appreciate the opportunity to participate and collaborate in the care of this patient.      History of Present Illness:     Primary Care Provider: Karrie Doffing, PA    HPI:  Bethany Meyer is a 65 y.o.  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  ??  As background, Ms. Bomba's joint pain first started around May/June 2016 with right knee pain and swelling, then right foot and ankle swelling. She is RF negative, CCP positive. She was started on methotrexate but had persistent joint pain and swelling so was started on Humira in March 2017. She is on methotrexate 20mg  weekly po (have been unable to get SQ approved by insurance). ??She was increased to weekly Humira but continued to have joint pain and swelling. ??Enbrel was not approved by insurance so prescription was sent for [name] instead in 2018. [name] was initially effective but the patient experienced several flares while on this medication. This was discontinued in May 2019 and she was started on Cimzia in June 2019. Cimzia was discontinued in summer 2020 due to lack of perceived benefit from medication.    Last visit 12/2020, since then she has been restarted on Ritux.    She reports feeling much better overall and has continued to feel that her tox is been quite helpful.  She has not had any worsening swelling/stiffness in her hands, wrists, elbows, knees, ankles, feet.  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  Reports this is all over her hand both on the palm and back of it.  Says that this really to hold after her accident and then worsened a little after her surgery.  Overall though everything else has been quite improved with completion of her surgery in May and then restarting the rituximab.  Right hip still bothers her a little bit, although she feels that this is become less painful with time away from her fall.  No falls since her last visit.  Morning stiffness overall lasts less than an hour.    Balance of 10 systems was reviewed and is negative except for that mentioned in the HPI.    Review of records: I have reviewed labs/images/clinic notes per the computerized medical record.   X-ray: IMPRESSION:  -Subtle cortical irregularity at the second middle phalanx at the PIP joint, which is only visualized on oblique views and may be artifactual. Recommend correlation with point tenderness if fracture in this region is suspected.    Allergies:  Patient has no known allergies.    Medications:     Current Outpatient Medications:   ???  acetaminophen (TYLENOL) 325 MG tablet, Take 325 mg by mouth two (2) times a day as needed for pain., Disp: , Rfl:   ???  aspirin 81 MG chewable tablet, CHEW 1 TABLET BY MOUTH DAILY, Disp: 36 tablet, Rfl: 3  ???  buPROPion (WELLBUTRIN XL) 150 MG 24 hr tablet, Take 1 tablet (150 mg total) by mouth every morning., Disp: 90 tablet, Rfl: 2  ???  cefdinir (OMNICEF) 300 MG capsule, Take 1 capsule (300 mg total) by mouth every twelve (12) hours., Disp: 14 capsule, Rfl: 0  ???  cyclobenzaprine (FLEXERIL) 5 MG tablet, Take 5-10 mg by mouth nightly as needed., Disp: , Rfl:   ???  dicyclomine (BENTYL) 20 mg tablet, Take 1 tablet (20 mg total) by mouth four times a day as needed (stomach cramps)., Disp: 360 tablet, Rfl: 3  ???  empty container Misc, Use as directed, Disp: 1 each, Rfl: 2  ???  estradioL (ESTRACE) 0.01 % (0.1 mg/gram) vaginal cream, Insert 2 g into the vagina Two (2) times a week. Insert 2 g inside vagina and apply a pea-sized amount around urethra twice weekly, Disp: , Rfl: 0  ???  ferrous sulfate 325 (65 FE) MG EC tablet, Take 1 tablet (325 mg total) by mouth every other day., Disp: 45 tablet, Rfl: 3  ???  gabapentin (NEURONTIN) 300 MG capsule, Take 3 capsules (900 mg total) by mouth Three times a day., Disp: 270 capsule, Rfl: 3  ???  haloperidoL (HALDOL) 0.5 MG tablet, Take 1 tablet (0.5 mg total) by mouth daily before breakfast., Disp: 90 tablet, Rfl: 3  ???  hydrOXYzine (ATARAX) 10 MG tablet, Take 1 tablet (10 mg total) by mouth Three times a day as needed for itching., Disp: 90 tablet, Rfl: 1  ???  leg brace (KNEE SUPPORT BRACE) Misc, 1 each by Miscellaneous route daily as needed., Disp: 1 each, Rfl: 0  ???  methocarbamoL (ROBAXIN) 500 MG tablet, Take 1 tablet (500 mg total) by mouth Three times a day., Disp: 90 tablet, Rfl: 1  ???  methotrexate, PF, (OTREXUP, PF,) 25 mg/0.4 mL [name], Inject the contents of 1 pen (25mg ) under the skin every 7 days, Disp: 4.8 mL, Rfl: 1  ???  mirtazapine (REMERON) 30 MG tablet, Take 1 tablet (30 mg total) by mouth nightly., Disp: 90 tablet, Rfl: 2  ???  needle, disp, 25 gauge 25 gauge x 5/8 Ndle, For methotrexate injections, Disp: 100 each, Rfl: 0  ???  nicotine (NICODERM CQ) 14 mg/24 hr patch, Place 1 patch on the skin daily., Disp: 28 patch, Rfl: 1  ???  pantoprazole (PROTONIX) 40 MG tablet, Take 1 tablet (40 mg total) by mouth daily., Disp: 90 tablet, Rfl: 3  ???  sucralfate (CARAFATE) 100 mg/mL suspension, Take 10 mL (1 g total) by mouth Four times a day (before meals and nightly)., Disp: 420 mL, Rfl: 11  ???  traMADoL (ULTRAM) 50 mg tablet, Take 1 tablet (50 mg total) by mouth every six (6) hours as needed for pain for up to 10 doses., Disp: 10 tablet, Rfl: 0  ???  VITAMIN B-1 100 mg tablet, Take 1 tablet (100 mg total) by mouth daily., Disp: 100 tablet, Rfl: 3  ???  arm brace Misc, Boxer splint, metacarpal wrist brace (Patient not taking: Reported on 04/09/2021), Disp: 1 each, Rfl: 0    Medical History:  Past Medical History:   Diagnosis Date   ??? Arthritis     RA   ??? Cataract    ??? Functional dyspepsia    ??? GERD (gastroesophageal reflux disease)    ??? Gout    ??? RA (rheumatoid arthritis) (CMS-HCC)    ??? Smoking    ??? Stage 1 mild COPD by GOLD classification (CMS-HCC) 02/25/2017       Surgical History:  Past Surgical History:   Procedure Laterality Date   ??? ovarian cyst removed     ??? OVARY SURGERY     ??? PR COLONOSCOPY FLX DX W/COLLJ SPEC WHEN PFRMD N/A 03/05/2013    Procedure: COLONOSCOPY, FLEXIBLE, PROXIMAL TO SPLENIC FLEXURE; DIAGNOSTIC, W/WO COLLECTION SPECIMEN BY BRUSH OR WASH;  Surgeon: Trudie Buckler, MD;  Location: GI PROCEDURES [facility];  Service: Gastroenterology   ??? PR COMPRE EP EVAL ABLTJ 3D MAPG TX SVT N/A 12/26/2015    Procedure: A-Flutter Ablation;  Surgeon: Theresia Majors, MD;  Location: [facility] EP;  Service: Cardiology   ??? PR UP GI ENDOSCOPY,BALL DIL,30MM N/A 06/03/2019    Procedure: UGI ENDO; W/BALLOON DILAT ESOPHAGUS (<30MM DIAM);  Surgeon: Neysa Hotter, MD;  Location: [facility] PROCED[facility];  Service: Gastroenterology   ??? PR UPPER GI ENDOSCOPY,BIOPSY N/A 09/16/2012  Procedure: UGI ENDOSCOPY; WITH BIOPSY, SINGLE OR MULTIPLE;  Surgeon: Brown Human, MD;  [facility]: [facility];  Service: Gastroenterology   ??? PR UPPER GI ENDOSCOPY,BIOPSY N/A 06/03/2019    Procedure: UGI ENDOSCOPY; WITH BIOPSY, SINGLE OR MULTIPLE;  Surgeon: Neysa Hotter, MD;  Location: [facility];  Service: Gastroenterology   ??? PR UPPER GI ENDOSCOPY,DIAGNOSIS N/A 06/03/2019    Procedure: UGI ENDO, INCLUDE ESOPHAGUS, STOMACH, & DUODENUM &/OR JEJUNUM; DX W/WO COLLECTION SPECIMN, BY BRUSH OR WASH;  Surgeon: Neysa Hotter, MD;  Location: [facility] PROCEDURES [facility];  Service: Gastroenterology       Social History:  Social History     Tobacco Use   ??? Smoking status: Every Day     Packs/day: 1.00     Years: 35.00     Pack years: 35.00     Types: Cigarettes   ??? Smokeless tobacco: Never   ??? Tobacco comments:     Started smoking at 33,    Vaping Use   ??? Vaping Use: Some days   Substance Use Topics   ??? Alcohol use: No     Alcohol/week: 0.0 standard drinks   ??? Drug use: Yes     Types: Marijuana     Comment: once a month       Family History:  Family History   Problem Relation Age of Onset   ??? Diabetes Mother         type 2   ??? Breast cancer Mother    ??? Diabetes Father         type 2   ??? Cataracts Father    ??? Macular degeneration Neg Hx    ??? Retinal detachment Neg Hx    ??? Colon cancer Neg Hx    ??? Endometrial cancer Neg Hx    ??? Ovarian cancer Neg Hx    ??? Glaucoma Neg Hx    ??? Blindness Neg Hx            Objective   Vitals:    04/09/21 1542   BP: 129/70   BP Site: L Arm   BP Position: Sitting   BP Cuff Size: Small   Pulse: 76   Temp: 36.2 ??C (97.1 ??F)   TempSrc: Temporal   Weight: 59.5 kg (131 lb 3.2 oz)   Height: 167.6 cm (5' 6)       Physical Exam:  General: Well-appearing.  HEENT: Sclera anicteric;   Pulmonary: Unremarkable respiratory effort.   Neuro: Moving all four extremities spontaneously, able to walk to the exam table independently although with a slight limp favoring her R side.   Psych: Euthymic affect.  MSK:     ??? Hands: No swelling or tenderness to palpation of the MCPs, PIPs, or DIPs.  ??? Wrists: No swelling or tenderness to palpation of the wrists. Slight restriction with wrist extension.  ??? Elbows: No swelling or tenderness to palpation of the elbows.  ??? Shoulders: No swelling or tenderness to palpation of the bilateral shoulders.Marland Kitchen  ??? Hips: Right lateral hip tenderness -- did not assess ROM.   ??? Knees: No swelling or tenderness to palpation of the bilateral knees.  ??? Ankles: No swelling or tenderness to palpation of the bilateral ankles.  ??? Feet: No swelling or tenderness to palpation of the ankle or MTPs.  Skin: No evidence of erythema or rash.  Extremities: Warm, no LE edema.    Test Results  Lab Results  Component Value Date    ANA Negative 11/30/2014       Lab Results   Component Value Date    CKTOTAL 208.0 (H) 02/14/2020    CRP <4.0 06/19/2020    ESR 55 (H) 06/19/2020       Lab Results   Component Value Date    CREATUR 180.6 07/02/2019    PROTEINUR <4.0 08/27/2019    PCRATIOUR 0.087 07/02/2019       Lab Results   Component Value Date    HBSAG Nonreactive 12/23/2014    HEPBSAB Reactive (A) 06/23/2019    QFTTBGOLD Negative 10/01/2017    HEPCAB Nonreactive 06/23/2019       Lab Results   Component Value Date    NITRITE Negative 08/26/2020    PROTEINUA Negative 08/26/2020    RBCUA 0 08/26/2020    WBC 7.2 12/20/2020    BLOODU Negative 08/26/2020    KETONESU Trace (A) 08/26/2020

## 2021-04-10 NOTE — Unmapped (Signed)
Addended by: Denyce Robert on: 04/10/2021 08:31 AM     Modules accepted: Level of Service

## 2021-04-19 DIAGNOSIS — M0579 Rheumatoid arthritis with rheumatoid factor of multiple sites without organ or systems involvement: Principal | ICD-10-CM

## 2021-04-24 NOTE — Unmapped (Signed)
[facility] RHEUMATOLOGY CLINIC - PHARMACIST NOTES    Ms. Bethany Meyer is currently filling methotrexate through the [facility]. The [facility] have made 2 unsuccessful attempts to refill her medication(s) the past few weeks.  Last shipment went out on 10/11 for a 1 month supply.      Attempt to reach patient from clinic for follow up today. Unable to get in touch with patient, left voicemail requesting call back to [facility] at [phone], option 4.   Sandria Bales, PharmD, MPA - He/Him/His  [facility] Generalist  P: [phone]; F: [phone]

## 2021-05-03 ENCOUNTER — Ambulatory Visit: Admit: 2021-05-03 | Discharge: 2021-05-04 | Payer: PRIVATE HEALTH INSURANCE

## 2021-05-03 DIAGNOSIS — F32A Depression, unspecified depression type: Principal | ICD-10-CM

## 2021-05-03 DIAGNOSIS — D849 Immunodeficiency, unspecified: Principal | ICD-10-CM

## 2021-05-03 DIAGNOSIS — F17200 Nicotine dependence, unspecified, uncomplicated: Principal | ICD-10-CM

## 2021-05-03 DIAGNOSIS — M4316 Spondylolisthesis, lumbar region: Principal | ICD-10-CM

## 2021-05-03 DIAGNOSIS — K21 Gastroesophageal reflux disease with esophagitis without hemorrhage: Principal | ICD-10-CM

## 2021-05-03 DIAGNOSIS — M5431 Sciatica, right side: Principal | ICD-10-CM

## 2021-05-03 DIAGNOSIS — M069 Rheumatoid arthritis, unspecified: Principal | ICD-10-CM

## 2021-05-03 DIAGNOSIS — M48062 Spinal stenosis, lumbar region with neurogenic claudication: Principal | ICD-10-CM

## 2021-05-03 MED ORDER — METHOCARBAMOL 500 MG TABLET
ORAL_TABLET | Freq: Three times a day (TID) | ORAL | 1 refills | 30.00000 days | Status: CP
Start: 2021-05-03 — End: ?

## 2021-05-03 MED ORDER — PANTOPRAZOLE 40 MG TABLET,DELAYED RELEASE
ORAL_TABLET | Freq: Every day | ORAL | 3 refills | 90 days | Status: CN
Start: 2021-05-03 — End: ?

## 2021-05-03 MED ORDER — HYDROXYZINE HCL 10 MG TABLET
ORAL_TABLET | Freq: Three times a day (TID) | ORAL | 1 refills | 30 days | Status: CP | PRN
Start: 2021-05-03 — End: ?

## 2021-05-03 MED ORDER — OMEPRAZOLE 20 MG CAPSULE,DELAYED RELEASE
ORAL_CAPSULE | Freq: Every day | ORAL | 3 refills | 90 days | Status: CP
Start: 2021-05-03 — End: 2022-05-03

## 2021-05-03 MED ORDER — GABAPENTIN 300 MG CAPSULE
ORAL_CAPSULE | Freq: Three times a day (TID) | ORAL | 3 refills | 30.00000 days | Status: CP
Start: 2021-05-03 — End: ?

## 2021-05-03 MED ORDER — VITAMIN B-1 100 MG TABLET
ORAL_TABLET | Freq: Every day | ORAL | 3 refills | 100.00000 days | Status: CP
Start: 2021-05-03 — End: ?

## 2021-05-03 MED ORDER — MIRTAZAPINE 30 MG TABLET
ORAL_TABLET | Freq: Every evening | ORAL | 2 refills | 90 days | Status: CP
Start: 2021-05-03 — End: 2022-05-03

## 2021-05-03 NOTE — Unmapped (Signed)
So good to see you today   You are looking great!  Keep up the good work with PT  Get your pneumococcal 20 vaccine at your local pharmacy  You can also get your Shingles vaccine at your local pharmacy (separate this apart from other vaccines)

## 2021-05-03 NOTE — Unmapped (Signed)
Internal Medicine Clinic    Return Visit    Face to Face    Assessment and Plan:    1. Spinal stenosis of lumbar region with neurogenic claudication    2. Sciatica of right side    3. Gastroesophageal reflux disease with esophagitis without hemorrhage    4. Depression, unspecified depression type    5. Rheumatoid arthritis, involving unspecified site, unspecified whether rheumatoid factor present (CMS-HCC)    6. Spondylolisthesis of lumbar region    7. Immunosuppressed status (CMS-HCC)    8. Tobacco use disorder        HCM: had influenza, covid booster and meningococcal.  She can receive her shingles, pneumo20 and Tdap at her local pharmacy    GERD renewed omeprazole, helping    Difficulty sleeping:  Add back mirtazapine 30 mg at bedtime    Spinal stenosis of lumbar region with radiculopathy:  Much improved pain control after surgery in May.  Continues gabapentin and PT, doing well.  Weight up.    Tobacco abuse: working to decrease # cigarettes per day    Osteoporosis:  Newly on fosamax tolerating well    I spent a total of 40 min total including pre intra and post visit activities    Medication adherence and barriers to the treatment plan have been addressed. Opportunities to optimize healthy behaviors have been discussed. Patient / caregiver voiced understanding.      _____________________________________________________________________    Chief Complaint:  Follow up    Patient Active Problem List    Diagnosis Date Noted   ??? Slow transit constipation 09/18/2019   ??? Generalized abdominal pain 08/28/2019   ??? 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 Paraesophageal hernia 05/08/2019   ??? Severe protein-calorie malnutrition (CMS-HCC) 05/08/2019   ??? AKI (acute kidney injury) (CMS-HCC) 02/05/2019   ??? PAIN MEDICATION SIGNED WITH [facility] 12/01/2018   ??? Urinary tract infection 11/12/2018   ??? Spinal stenosis of lumbar region with neurogenic claudication 09/18/2018   ??? Spondylolisthesis of lumbar region 09/18/2018   ??? Nausea and vomiting 05/25/2018   ??? Piriformis syndrome of both sides 03/11/2018   ??? Intractable nausea and vomiting 10/23/2017   ??? Stage 1 mild COPD by GOLD classification (CMS-HCC) 02/25/2017   ??? Rheumatoid arthritis involving both feet (CMS-HCC)    ??? Pain of toe of left foot 01/27/2016   ??? Immunosuppressed status (CMS-HCC) 01/27/2016   ??? Osteomyelitis (CMS-HCC) 01/26/2016   ??? Atrial flutter (CMS-HCC) 12/25/2015   ??? Rheumatoid arthritis (CMS-HCC) 01/11/2015   ??? Anxiety 02/10/2013   ??? Depression 02/10/2013   ??? Functional Abdominal pain 08/29/2012   ??? Gastritis 08/26/2012   ??? GERD (gastroesophageal reflux disease) 08/24/2012   ??? Tobacco use disorder 01/01/2011       HPI:  Bethany Meyer is a pleasant 65 yo female here for follow up.    Had her spinal surgery in May, feeling much better.  Mobility and pain better.  Working with PT every week    Working with PT has hernia she htinks had CT    Sleep not good right.  Sleeping 3-4 hours.  Last night slept best she has in months.  Took omeprazole and this helps.  Would like to renew.    Weight up, more mobile and getting around better    Current Outpatient Medications on File Prior to Visit   Medication Sig   ??? acetaminophen (TYLENOL) 325 MG tablet Take 325 mg by mouth two (2) times a day as needed for  pain.   ??? alendronate (FOSAMAX) 70 MG tablet Take 1 tablet (70 mg total) by mouth every seven (7) days.   ??? arm brace Misc Boxer splint, metacarpal wrist brace (Patient not taking: Reported on 04/09/2021)   ??? aspirin 81 MG chewable tablet CHEW 1 TABLET BY MOUTH DAILY   ??? buPROPion (WELLBUTRIN XL) 150 MG 24 hr tablet Take 1 tablet (150 mg total) by mouth every morning.   ??? cefdinir (OMNICEF) 300 MG capsule Take 1 capsule (300 mg total) by mouth every twelve (12) hours.   ??? cyclobenzaprine (FLEXERIL) 5 MG tablet Take 5-10 mg by mouth nightly as needed.   ??? [EXPIRED] dicyclomine (BENTYL) 20 mg tablet Take 1 tablet (20 mg total) by mouth four times a day as needed (stomach cramps).   ??? empty container Misc Use as directed   ??? estradioL (ESTRACE) 0.01 % (0.1 mg/gram) vaginal cream Insert 2 g into the vagina Two (2) times a week. Insert 2 g inside vagina and apply a pea-sized amount around urethra twice weekly   ??? ferrous sulfate 325 (65 FE) MG EC tablet Take 1 tablet (325 mg total) by mouth every other day.   ??? gabapentin (NEURONTIN) 300 MG capsule Take 3 capsules (900 mg total) by mouth Three times a day.   ??? haloperidoL (HALDOL) 0.5 MG tablet Take 1 tablet (0.5 mg total) by mouth daily before breakfast.   ??? hydrOXYzine (ATARAX) 10 MG tablet Take 1 tablet (10 mg total) by mouth Three times a day as needed for itching.   ??? leg brace (KNEE SUPPORT BRACE) Misc 1 each by Miscellaneous route daily as needed.   ??? methocarbamoL (ROBAXIN) 500 MG tablet Take 1 tablet (500 mg total) by mouth Three times a day.   ??? methotrexate, PF, (OTREXUP, PF,) 25 mg/0.4 mL [name] Inject the contents of 1 pen (25mg ) under the skin every 7 days   ??? mirtazapine (REMERON) 30 MG tablet Take 1 tablet (30 mg total) by mouth nightly.   ??? needle, disp, 25 gauge 25 gauge x 5/8 Ndle For methotrexate injections   ??? nicotine (NICODERM CQ) 14 mg/24 hr patch Place 1 patch on the skin daily.   ??? pantoprazole (PROTONIX) 40 MG tablet Take 1 tablet (40 mg total) by mouth daily.   ??? sucralfate (CARAFATE) 100 mg/mL suspension Take 10 mL (1 g total) by mouth Four times a day (before meals and nightly).   ??? traMADoL (ULTRAM) 50 mg tablet Take 1 tablet (50 mg total) by mouth every six (6) hours as needed for pain for up to 10 doses.   ??? VITAMIN B-1 100 mg tablet Take 1 tablet (100 mg total) by mouth daily.     No current facility-administered medications on file prior to visit.        Review of Systems -  HPI    Exam:  BP 114/81 (BP Site: L Arm)  - Pulse 66  - Temp 36.3 ??C (97.3 ??F) (Temporal)  - Resp 18  - Ht 167.6 cm (5' 6)  - Wt 60.1 kg (132 lb 9.6 oz)  - LMP 05/20/2005  - SpO2 98%  - BMI 21.40 kg/m??   Gen:  Pleasant in NAD  PERRL  CV:  RRR no mrg  Pulm:  CTA bl      Laural Benes, PA-C  Supervising physician Gabrielle Dare, MD

## 2021-05-05 NOTE — Unmapped (Signed)
[facility] RHEUMATOLOGY CLINIC - PHARMACIST NOTES    [facility] unable to reach patient for refill of subcutaneous methotrexate. Last shipment went out on 02/27/21 for 1 month supply. Attempted to follow up with patient on 04/23/21 w/o success. Called again today and unable to leave voicemail.  No future appt with [facility]. Dr. Blase Mess notified.    [name]

## 2021-05-07 NOTE — Unmapped (Signed)
Unable to lv msg, mailbox full.  Per Dr. Blase Mess. Would you mind seeing if she could come to see me 2/27 at 8 am or 3/6 at 8 am or 4 pm?

## 2021-05-28 MED ORDER — HYDROXYZINE HCL 10 MG TABLET
ORAL_TABLET | ORAL | 1 refills | 0.00000 days | Status: CP
Start: 2021-05-28 — End: ?

## 2021-06-18 MED ORDER — SUCRALFATE 100 MG/ML ORAL SUSPENSION
Freq: Four times a day (QID) | ORAL | 11 refills | 11 days | Status: CN
Start: 2021-06-18 — End: 2022-06-18

## 2021-07-06 DIAGNOSIS — F32A Depression, unspecified depression type: Principal | ICD-10-CM

## 2021-07-06 MED ORDER — BUPROPION HCL XL 150 MG 24 HR TABLET, EXTENDED RELEASE
ORAL_TABLET | 2 refills | 0 days
Start: 2021-07-06 — End: ?

## 2021-07-09 DIAGNOSIS — R1084 Generalized abdominal pain: Principal | ICD-10-CM

## 2021-07-09 MED ORDER — DICYCLOMINE 20 MG TABLET
ORAL_TABLET | Freq: Four times a day (QID) | ORAL | 3 refills | 90 days | Status: CP | PRN
Start: 2021-07-09 — End: 2022-07-09
  Filled 2021-07-12: qty 360, 90d supply, fill #0

## 2021-07-09 MED ORDER — BUPROPION HCL XL 150 MG 24 HR TABLET, EXTENDED RELEASE
ORAL_TABLET | 2 refills | 0 days | Status: CP
Start: 2021-07-09 — End: ?

## 2021-07-27 NOTE — Unmapped (Signed)
Specialty Medication(s): Otrexup    Ms.Sauser has been dis-enrolled from the [facility] specialty pharmacy services due to multiple unsuccessful outreach attempts by the pharmacy.    Additional information provided to the patient: last filled 02/27/21 for 28 day supply    Laurene Footman Sequita Wise  [facility] Specialty Pharmacist

## 2021-07-29 DIAGNOSIS — M0579 Rheumatoid arthritis with rheumatoid factor of multiple sites without organ or systems involvement: Principal | ICD-10-CM

## 2021-08-26 DIAGNOSIS — M0579 Rheumatoid arthritis with rheumatoid factor of multiple sites without organ or systems involvement: Principal | ICD-10-CM

## 2021-10-21 DIAGNOSIS — M0579 Rheumatoid arthritis with rheumatoid factor of multiple sites without organ or systems involvement: Principal | ICD-10-CM

## 2021-10-31 DIAGNOSIS — M5431 Sciatica, right side: Principal | ICD-10-CM

## 2021-10-31 MED ORDER — GABAPENTIN 300 MG CAPSULE
ORAL_CAPSULE | Freq: Three times a day (TID) | ORAL | 3 refills | 30 days | Status: CN
Start: 2021-10-31 — End: ?

## 2021-11-01 ENCOUNTER — Ambulatory Visit: Admit: 2021-11-01 | Discharge: 2021-11-02 | Payer: PRIVATE HEALTH INSURANCE

## 2021-11-01 DIAGNOSIS — F32A Depression, unspecified depression type: Principal | ICD-10-CM

## 2021-11-01 DIAGNOSIS — Z1239 Encounter for other screening for malignant neoplasm of breast: Principal | ICD-10-CM

## 2021-11-01 DIAGNOSIS — M05771 Rheumatoid arthritis with rheumatoid factor of right ankle and foot without organ or systems involvement: Principal | ICD-10-CM

## 2021-11-01 DIAGNOSIS — G5602 Carpal tunnel syndrome, left upper limb: Principal | ICD-10-CM

## 2021-11-01 DIAGNOSIS — M5431 Sciatica, right side: Principal | ICD-10-CM

## 2021-11-01 DIAGNOSIS — M05772 Rheumatoid arthritis with rheumatoid factor of left ankle and foot without organ or systems involvement: Principal | ICD-10-CM

## 2021-11-01 DIAGNOSIS — L84 Corns and callosities: Principal | ICD-10-CM

## 2021-11-01 MED ORDER — AMITRIPTYLINE 25 MG TABLET
ORAL_TABLET | Freq: Every evening | ORAL | 3 refills | 90 days | Status: CP
Start: 2021-11-01 — End: 2022-11-01

## 2021-11-01 MED ORDER — METHOCARBAMOL 500 MG TABLET
ORAL_TABLET | Freq: Three times a day (TID) | ORAL | 1 refills | 30 days | Status: CP
Start: 2021-11-01 — End: ?

## 2021-11-01 MED ORDER — BUPROPION HCL XL 150 MG 24 HR TABLET, EXTENDED RELEASE
ORAL_TABLET | Freq: Every morning | ORAL | 2 refills | 90 days | Status: CP
Start: 2021-11-01 — End: ?

## 2021-11-01 MED ORDER — ALENDRONATE 70 MG TABLET
ORAL_TABLET | ORAL | 3 refills | 84 days | Status: CP
Start: 2021-11-01 — End: 2022-11-01

## 2021-11-01 MED ORDER — FERROUS SULFATE 325 MG (65 MG IRON) TABLET,DELAYED RELEASE
ORAL_TABLET | ORAL | 3 refills | 90 days | Status: CP
Start: 2021-11-01 — End: 2022-11-01

## 2021-11-01 MED ORDER — GABAPENTIN 300 MG CAPSULE
ORAL_CAPSULE | 3 refills | 0 days | Status: CP
Start: 2021-11-01 — End: ?

## 2021-11-01 MED ORDER — OMEPRAZOLE 20 MG CAPSULE,DELAYED RELEASE
ORAL_CAPSULE | Freq: Every day | ORAL | 3 refills | 90 days | Status: CP
Start: 2021-11-01 — End: 2022-11-01

## 2021-11-01 MED ORDER — HALOPERIDOL 0.5 MG TABLET
ORAL_TABLET | Freq: Every day | ORAL | 3 refills | 90 days | Status: CP
Start: 2021-11-01 — End: 2022-11-01

## 2021-11-01 MED FILL — DICYCLOMINE 20 MG TABLET: ORAL | 90 days supply | Qty: 360 | Fill #1

## 2021-11-15 ENCOUNTER — Ambulatory Visit: Admit: 2021-11-15 | Discharge: 2021-11-16 | Payer: PRIVATE HEALTH INSURANCE

## 2021-11-18 DIAGNOSIS — M0579 Rheumatoid arthritis with rheumatoid factor of multiple sites without organ or systems involvement: Principal | ICD-10-CM

## 2021-11-23 ENCOUNTER — Ambulatory Visit: Admit: 2021-11-23 | Discharge: 2021-11-24 | Payer: PRIVATE HEALTH INSURANCE

## 2021-11-23 DIAGNOSIS — D849 Immunodeficiency, unspecified: Principal | ICD-10-CM

## 2021-11-23 DIAGNOSIS — M0579 Rheumatoid arthritis with rheumatoid factor of multiple sites without organ or systems involvement: Principal | ICD-10-CM

## 2021-11-23 DIAGNOSIS — Z716 Tobacco abuse counseling: Principal | ICD-10-CM

## 2021-11-23 DIAGNOSIS — R61 Generalized hyperhidrosis: Principal | ICD-10-CM

## 2021-11-23 DIAGNOSIS — M059 Rheumatoid arthritis with rheumatoid factor, unspecified: Principal | ICD-10-CM

## 2021-11-23 DIAGNOSIS — Z79631 Methotrexate, long term, current use: Principal | ICD-10-CM

## 2021-11-23 MED ORDER — NICOTINE (POLACRILEX) 2 MG GUM
BUCCAL | 0 refills | 10 days | Status: CP | PRN
Start: 2021-11-23 — End: 2021-12-23

## 2021-11-23 MED ORDER — PREDNISONE 5 MG TABLET
ORAL_TABLET | ORAL | 0 refills | 29 days | Status: CP
Start: 2021-11-23 — End: 2021-12-22

## 2021-11-23 MED ORDER — NICOTINE 14 MG/24 HR DAILY TRANSDERMAL PATCH
MEDICATED_PATCH | TRANSDERMAL | 0 refills | 28 days | Status: CP
Start: 2021-11-23 — End: ?

## 2021-12-03 DIAGNOSIS — M0579 Rheumatoid arthritis with rheumatoid factor of multiple sites without organ or systems involvement: Principal | ICD-10-CM

## 2021-12-05 ENCOUNTER — Ambulatory Visit: Admit: 2021-12-05 | Payer: BLUE CROSS/BLUE SHIELD

## 2021-12-20 DIAGNOSIS — M059 Rheumatoid arthritis with rheumatoid factor, unspecified: Principal | ICD-10-CM

## 2021-12-20 DIAGNOSIS — D849 Immunodeficiency, unspecified: Principal | ICD-10-CM

## 2021-12-20 DIAGNOSIS — Z79631 Methotrexate, long term, current use: Principal | ICD-10-CM

## 2021-12-20 MED ORDER — PREDNISONE 5 MG TABLET
ORAL_TABLET | ORAL | 0 refills | 29 days
Start: 2021-12-20 — End: 2022-01-17

## 2021-12-20 MED ORDER — HYDROXYZINE HCL 10 MG TABLET
ORAL_TABLET | 1 refills | 0 days | Status: CP
Start: 2021-12-20 — End: ?

## 2021-12-23 DIAGNOSIS — M0579 Rheumatoid arthritis with rheumatoid factor of multiple sites without organ or systems involvement: Principal | ICD-10-CM

## 2021-12-25 ENCOUNTER — Emergency Department
Admit: 2021-12-25 | Discharge: 2021-12-26 | Disposition: A | Discharge status: Home / Self Care | Payer: Worker's Compensation

## 2021-12-25 ENCOUNTER — Ambulatory Visit
Admit: 2021-12-25 | Discharge: 2021-12-26 | Disposition: A | Discharge status: Home / Self Care | Payer: Worker's Compensation

## 2021-12-25 DIAGNOSIS — L03114 Cellulitis of left upper limb: Principal | ICD-10-CM

## 2021-12-25 MED ORDER — SULFAMETHOXAZOLE 800 MG-TRIMETHOPRIM 160 MG TABLET
ORAL_TABLET | Freq: Two times a day (BID) | ORAL | 0 refills | 10 days | Status: CP
Start: 2021-12-25 — End: 2022-01-04

## 2022-01-16 ENCOUNTER — Ambulatory Visit
Admit: 2022-01-16 | Discharge: 2022-01-17 | Attending: Student in an Organized Health Care Education/Training Program | Primary: Student in an Organized Health Care Education/Training Program

## 2022-01-16 DIAGNOSIS — M0579 Rheumatoid arthritis with rheumatoid factor of multiple sites without organ or systems involvement: Principal | ICD-10-CM

## 2022-01-16 DIAGNOSIS — M810 Age-related osteoporosis without current pathological fracture: Principal | ICD-10-CM

## 2022-01-16 DIAGNOSIS — M069 Rheumatoid arthritis, unspecified: Principal | ICD-10-CM

## 2022-01-16 LAB — HEPATIC FUNCTION PANEL
ALBUMIN: 3.1 g/dL — ABNORMAL LOW (ref 3.4–5.0)
ALKALINE PHOSPHATASE: 81 U/L (ref 46–116)
ALT (SGPT): 12 U/L (ref 10–49)
AST (SGOT): 22 U/L (ref <=34)
BILIRUBIN DIRECT: <0.1 mg/dL (ref 0.00–0.30)
BILIRUBIN TOTAL: <0.2 mg/dL — ABNORMAL LOW (ref 0.3–1.2)
PROTEIN TOTAL: 7.5 g/dL (ref 5.7–8.2)

## 2022-01-16 MED ORDER — ALENDRONATE 70 MG TABLET
ORAL_TABLET | ORAL | 3 refills | 84 days | Status: CP
Start: 2022-01-16 — End: 2023-01-16
  Filled 2022-01-23: qty 4, 28d supply, fill #0

## 2022-01-16 MED ORDER — METHOTREXATE SODIUM (CONTAINS PRESERVATIVES) 25 MG/ML INJECTION SOLUTION
SUBCUTANEOUS | 1 refills | 84 days | Status: CP
Start: 2022-01-16 — End: 2022-07-15

## 2022-01-16 MED ORDER — PREDNISONE 10 MG TABLET
ORAL_TABLET | ORAL | 0 refills | 48 days | Status: CP
Start: 2022-01-16 — End: 2022-03-05

## 2022-01-16 MED ORDER — MONOJECT TB SAFETY SYRINGE 1 ML 25 GAUGE X 5/8"
0 refills | 0 days | Status: CP
Start: 2022-01-16 — End: ?

## 2022-01-16 NOTE — Unmapped (Signed)
[facility] Specialty Medication Onboarding    Specialty Medication: methotrexate (CONTAINS PRESERVATIVES) 25 mg/mL injection solution  Prior Authorization: Not Required   Financial Assistance: No - copay card or gant not available   Final Copay/Day Supply: $45.31 / 27    Insurance Restrictions: None     Notes to Pharmacist: Patient needs active insurance or PAP /or patient can approve cash price.    The triage team has completed the benefits investigation and has determined that the patient is able to fill this medication at [facility]. Please contact the patient to complete the onboarding or follow up with the prescribing physician as needed.

## 2022-01-17 NOTE — Unmapped (Signed)
Assessment/Plan:   Bethany Meyer is a 66 y.o. 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    1. Seropositive (+CCP) non erosive RA   Active today with CDAI of 30 today with predominate L wrist > bilateral MCP/PIP involvement. This is in the setting of being off all medications (MTX stopped for at least 1 month and last Ritux 1 year ago). Interesting with asymmetric disease today, she does carry a Dx of gout although unclear circumstances of this and her uric acids have been <5 since 2017. Has been occurring for >75mo and no systemic signs/symptoms of infection. Most likely atypical pattern to RA flare. Restarting medications as below, will give pred course for now.  - Restart MTX 25mg  weekly SQ + FA daily  - Prednisone 40mg  x5d, 30mg  x5d, 20mg  x5d, 15mg  x5d, 10mg  x14d, 5mg  x14d  - Rituximab (last given (12/20/20) prior to that was 1g q14 days x2 doses with improvement in symptoms (12/30/19 and 01/13/20)), This was ordered at last visit but team had difficulty   - Reviewed labs at ED - no cytopenias, GFR stable from prior (>60), LFTs wnl    2.  Spinal stenosis  Patient with an MRI from 12/24/19 demonstrating severe spinal canal stenosis and foraminal narrowing at L4-5. Now with dramatic improvement after spinal surgery.     3.  Osteoporosis - Fall resulting in right proximal femur greater trochanteric fracture  Dexa 02/2021 with left hip T score -2.7 c/w osteoporosis. Discussed therapeutic options today, she had planned to start Fosamax but only took this for a couple weeks. She would like to restart. Again, reviewed dosing, side effect profile, and administration instructions. Vit D and Ca wnl.  - Restart Fosamax qweekly  - Vit D level    4. Tobacco use  She was smoking at least a pack per day, now down to ~8/day.   -- smoking cessation encouraged and prescribed NRT     5. Normocytic anemia   --will check CBC today.     I personally spent 43 minutes face-to-face and non-face-to-face in the care of this patient, which includes all pre, intra, and post visit time on the date of service.  All documented time was specific to the E/M visit and does not include any procedures that may have been performed.    Health Care Maintenance:  Routine health maintenance discussed  Immunization History   Administered Date(s) Administered Comments    COVID-19 VAC,BIVALENT(97YR UP),PFIZER 05/03/2021      11/01/2021     COVID-19 VACCINE,MRNA(MODERNA)(PF) 06/29/2019 Historical - Not administered in Epic     07/27/2019 Historical - Not administered in Epic     04/07/2020     DTP 12/22/1962 Historical - Not administered in Epic     02/04/1963 Historical - Not administered in Epic     07/22/1963 Historical - Not administered in Epic    HEPATITIS B VACCINE ADULT,IM(ENERGIX B, RECOMBIVAX) 01/03/2015      03/13/2015     Hepatitis B Vaccine, Unspecified Formulation 08/19/1996 Historical - Not administered in Epic     02/24/1997 Historical - Not administered in Epic    INFLUENZA QUAD HIGH DOSE 50YRS+(FLUZONE) 03/03/2019     Influenza Vaccine Quad (IIV4 PF) 31mo+ injectable 02/10/2013      05/03/2014      03/13/2015      03/20/2016      07/12/2017      03/11/2018  02/14/2020      05/03/2021     Influenza Virus Vaccine, unspecified formulation 02/10/2013      05/03/2014      03/13/2015      03/20/2016      07/12/2017      03/11/2018     MMR 12/07/1998 Historical - Not administered in Epic     12/20/1998 Historical - Not administered in Epic     04/04/1999 Historical - Not administered in Epic    Meningococcal Conjugate MCV4P 05/03/2021     OPV 07/19/1962 Historical - Not administered in Epic     09/13/1962 Historical - Not administered in Epic     12/22/1962 Historical - Not administered in Epic    PNEUMOCOCCAL POLYSACCHARIDE 23 01/11/2015     Pneumococcal Conjugate 20-valent 11/01/2021     Pneumococcal conjugate -PCV7 02/18/2012 TD(TDVAX),ADSORBED,2LF(IM)(PF) 12/07/1998 Historical - Not administered in Epic    TdaP 01/01/2011    Deferred Date(s) Deferred Comments    Influenza Vaccine Quad (IIV4 PF) 66mo+ injectable 05/17/2019      06/24/2019 Pt remember she had a dose already last year.  See immunization record.       Diagnoses and all orders for this visit:  Rheumatoid arthritis, involving unspecified site, unspecified whether rheumatoid factor present (CMS-HCC)  - methotrexate sodium (METHOTREXATE, CONTAINS PRESERVATIVES,) 25 mg/mL injection solution; Inject 1 mL (25 mg total) under the skin once a week.  - syringe with needle (MONOJECT TB SAFETY SYRINGE) 1 mL 25 gauge x 5/8 Syrg; Use as directed with methotrexate injections.  - predniSONE (DELTASONE) 10 MG tablet; Take 4 tablets (40 mg total) by mouth daily for 5 days, THEN 3 tablets (30 mg total) daily for 5 days, THEN 2 tablets (20 mg total) daily for 5 days, THEN 1.5 tablets (15 mg total) daily for 5 days, THEN 1 tablet (10 mg total) daily for 14 days, THEN 0.5 tablets (5 mg total) daily for 14 days.  - Hepatic Function Panel    Osteoporosis, unspecified osteoporosis type, unspecified pathological fracture presence  - 25 OH Vit D  - alendronate (FOSAMAX) 70 MG tablet; Take 1 tablet (70 mg total) by mouth every seven (7) days.    I appreciate the opportunity to participate and collaborate in the care of this patient.      History of Present Illness:     Primary Care Provider: Karrie Doffing, PA    HPI:  Bethany Meyer is a 66 y.o.  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    Today  Having a flare for at least the past month characterized L >> R wrist and hand swelling, pain, and stiffness. This correlates to her coming off her MTX and being unable to get RItux completed. She went to the ED and was given antibiotics for a possible cellulitis. This has not helped and she has continued to worsen. Otherwise well without SOB/cough, HA, visual changes, neck pain, chest pain..     As background  Ms. Rendall's joint pain first started around May/June 2016 with right knee pain and swelling, then right foot and ankle swelling. She is RF negative, CCP positive. She was started on methotrexate but had persistent joint pain and swelling so was started on Humira in March 2017. She is on methotrexate 20mg  weekly po (have been unable to get SQ approved by insurance).  She was increased to weekly Humira but continued to  have joint pain and swelling.  Enbrel was not approved by insurance so prescription was sent for [name] instead in 2018. [name] was initially effective but the patient experienced several flares while on this medication. This was discontinued in May 2019 and she was started on Cimzia in June 2019. Cimzia was discontinued in summer 2020 due to lack of perceived benefit from medication.    Weight Change Past 6 Months in Pounds (rounded): -1 at 01/17/2022  2:45 PM    Balance of 10 systems was reviewed and is negative except for that mentioned in the HPI.    Review of records: I have reviewed labs/images/clinic notes per the computerized medical record.   X-ray: IMPRESSION:  -Subtle cortical irregularity at the second middle phalanx at the PIP joint, which is only visualized on oblique views and may be artifactual. Recommend correlation with point tenderness if fracture in this region is suspected.    Allergies:  Patient has no known allergies.    Medications:     Current Outpatient Medications:     acetaminophen (TYLENOL) 325 MG tablet, Take 1 tablet (325 mg total) by mouth two (2) times a day as needed for pain., Disp: , Rfl:     amitriptyline (ELAVIL) 25 MG tablet, Take 1 tablet (25 mg total) by mouth nightly., Disp: 90 tablet, Rfl: 3    arm brace Misc, Boxer splint, metacarpal wrist brace, Disp: 1 each, Rfl: 0    aspirin 81 MG chewable tablet, CHEW 1 TABLET BY MOUTH DAILY, Disp: 36 tablet, Rfl: 3    buPROPion (WELLBUTRIN XL) 150 MG 24 hr tablet, Take 1 tablet (150 mg total) by mouth every morning., Disp: 90 tablet, Rfl: 2    dicyclomine (BENTYL) 20 mg tablet, Take 1 tablet (20 mg total) by mouth four times a day as needed (stomach cramps)., Disp: 360 tablet, Rfl: 3    empty container Misc, Use as directed, Disp: 1 each, Rfl: 2    estradioL (ESTRACE) 0.01 % (0.1 mg/gram) vaginal cream, Insert 2 g into the vagina Two (2) times a week. Insert 2 g inside vagina and apply a pea-sized amount around urethra twice weekly, Disp: , Rfl: 0    ferrous sulfate 325 (65 FE) MG EC tablet, Take 1 tablet (325 mg total) by mouth every other day., Disp: 45 tablet, Rfl: 3    gabapentin (NEURONTIN) 300 MG capsule, 3 capsules po morning and midday, 4 capsules po qhs, Disp: 360 capsule, Rfl: 3    haloperidoL (HALDOL) 0.5 MG tablet, Take 1 tablet (0.5 mg total) by mouth daily before breakfast., Disp: 90 tablet, Rfl: 3    hydrOXYzine (ATARAX) 10 MG tablet, TAKE 1 TABLET (10 MG TOTAL) BY MOUTH 3 TIMES A DAY AS NEEDED FOR ITCHING, Disp: 270 tablet, Rfl: 1    leg brace (KNEE SUPPORT BRACE) Misc, 1 each by Miscellaneous route daily as needed., Disp: 1 each, Rfl: 0    methocarbamoL (ROBAXIN) 500 MG tablet, Take 1 tablet (500 mg total) by mouth Three times a day., Disp: 90 tablet, Rfl: 1    nicotine (NICODERM CQ) 14 mg/24 hr patch, Place 1 patch on the skin daily., Disp: 28 patch, Rfl: 1    nicotine (NICODERM CQ) 14 mg/24 hr patch, Place 1 patch on the skin daily., Disp: 28 patch, Rfl: 0    VITAMIN B-1 100 mg tablet, Take 1 tablet (100 mg total) by mouth daily., Disp: 100 tablet, Rfl: 3    alendronate (FOSAMAX) 70 MG tablet, Take 1 tablet (  70 mg total) by mouth every seven (7) days., Disp: 12 tablet, Rfl: 3    methotrexate sodium (METHOTREXATE, CONTAINS PRESERVATIVES,) 25 mg/mL injection solution, Inject 1 mL (25 mg total) under the skin once a week., Disp: 12 mL, Rfl: 1    needle, disp, 25 gauge 25 gauge x 5/8 Ndle, For methotrexate injections (Patient not taking: Reported on 11/23/2021), Disp: 100 each, Rfl: 0    predniSONE (DELTASONE) 10 MG tablet, Take 4 tablets (40 mg total) by mouth daily for 5 days, THEN 3 tablets (30 mg total) daily for 5 days, THEN 2 tablets (20 mg total) daily for 5 days, THEN 1.5 tablets (15 mg total) daily for 5 days, THEN 1 tablet (10 mg total) daily for 14 days, THEN 0.5 tablets (5 mg total) daily for 14 days., Disp: 74 tablet, Rfl: 0    syringe with needle (MONOJECT TB SAFETY SYRINGE) 1 mL 25 gauge x 5/8 Syrg, Use as directed with methotrexate injections., Disp: 100 each, Rfl: 0    Medical History:  Past Medical History:   Diagnosis Date    Arthritis     RA    Cataract     Functional dyspepsia     GERD (gastroesophageal reflux disease)     Gout     RA (rheumatoid arthritis) (CMS-HCC)     Smoking     Stage 1 mild COPD by GOLD classification (CMS-HCC) 02/25/2017       Surgical History:  Past Surgical History:   Procedure Laterality Date    ovarian cyst removed      OVARY SURGERY      PR COLONOSCOPY FLX DX W/COLLJ SPEC WHEN PFRMD N/A 03/05/2013    Procedure: COLONOSCOPY, FLEXIBLE, PROXIMAL TO SPLENIC FLEXURE; DIAGNOSTIC, W/WO COLLECTION SPECIMEN BY BRUSH OR WASH;  Surgeon: Trudie Buckler, MD;  Location: GI PROCEDURES [facility];  Service: Gastroenterology    PR COMPRE EP EVAL ABLTJ 3D MAPG TX SVT N/A 12/26/2015    Procedure: A-Flutter Ablation;  Surgeon: Theresia Majors, MD;  Location: [facility] EP;  Service: Cardiology    PR UP GI ENDOSCOPY,BALL DIL,30MM N/A 06/03/2019    Procedure: UGI ENDO; W/BALLOON DILAT ESOPHAGUS (<30MM DIAM);  Surgeon: Neysa Hotter, MD;  Location: [facility];  Service: Gastroenterology    PR UPPER GI ENDOSCOPY,BIOPSY N/A 09/16/2012    Procedure: UGI ENDOSCOPY; WITH BIOPSY, SINGLE OR MULTIPLE;  Surgeon: Brown Human, MD;  [facility]: [facility]  Service: Gastroenterology    PR UPPER GI ENDOSCOPY,BIOPSY N/A 06/03/2019 Procedure: UGI ENDOSCOPY; WITH BIOPSY, SINGLE OR MULTIPLE;  Surgeon: Neysa Hotter, MD;  Location: [facility] PROCED[facility];  Service: Gastroenterology    PR UPPER GI ENDOSCOPY,DIAGNOSIS N/A 06/03/2019    Procedure: UGI ENDO, INCLUDE ESOPHAGUS, STOMACH, & DUODENUM &/OR JEJUNUM; DX W/WO COLLECTION SPECIMN, BY BRUSH OR WASH;  Surgeon: Neysa Hotter, MD;  Location: [facility] PROCEDURES [facility];  Service: Gastroenterology       Social History:  Social History     Tobacco Use    Smoking status: Every Day     Packs/day: 1.00     Years: 35.00     Additional pack years: 0.00     Total pack years: 35.00     Types: Cigarettes    Smokeless tobacco: Never    Tobacco comments:     Started smoking at 31,    Vaping Use    Vaping Use: Some days   Substance Use Topics  Alcohol use: No     Alcohol/week: 0.0 standard drinks of alcohol    Drug use: Yes     Types: Marijuana     Comment: once a month       Family History:  Family History   Problem Relation Age of Onset    Diabetes Mother         type 2    Breast cancer Mother     Diabetes Father         type 2    Cataracts Father     Macular degeneration Neg Hx     Retinal detachment Neg Hx     Colon cancer Neg Hx     Endometrial cancer Neg Hx     Ovarian cancer Neg Hx     Glaucoma Neg Hx     Blindness Neg Hx            Objective   Vitals:    01/16/22 1505   BP: 106/72   Pulse: 86   Temp: 36.4 ??C (97.6 ??F)   TempSrc: Temporal   Weight: 58.5 kg (129 lb)     Physical Exam:  General: Well-appearing.  HEENT: Sclera anicteric;   Pulmonary: Unremarkable respiratory effort.   Neuro: Moving all four extremities spontaneously, able to walk to the exam table independently   Psych: Euthymic affect.  MSK:     Hands: Swelling with bogginess/warmth of L wrist and 2-3 MCP on L>R. No other swelling or tenderness to palpation of the MCPs, PIPs, or DIPs.  Wrists: Left wrist swelling and pain as noted above. R wrist with some fullness but no bogginess.  Elbows: No swelling or tenderness to palpation of the elbows.  Shoulders: No swelling or tenderness to palpation of the bilateral shoulders.  Knees: No swelling or tenderness to palpation of the bilateral knees.  Ankles: No swelling or tenderness to palpation of the bilateral ankles.  Feet: Tenderness to palpation of the lateral hips.   Skin: No evidence of erythema or rash.  Extremities: Warm, no LE edema.  Swollen Joint Count (0-28): 7  Tender Joint Count (0-28): 7  Patient Global Assessment of Disease Activity (0-10): 9  Evaluator Global Assessment of Disease (0-10): 7  CDAI Score: 30  CDAI interpretation:  0.0-2.8 Remission   2.9-10.0 Low disease activity   10.1-22.0 Moderate disease activity   22.1-76.0 High disease activity

## 2022-01-17 NOTE — Unmapped (Unsigned)
**INCOMPLETE--Patient can apply for PAP?**    [facility]   Patient Onboarding/Medication Counseling    Bethany Meyer is a 66 y.o. female with RA who I am counseling today on {Blank:19197::initiation,continuation} of therapy.  I am speaking to {Blank:19197::the patient,the patient's caregiver, ***,the patient's family member, ***,***}.    Was a Nurse, learning disability used for this call? No    Verified patient's date of birth / HIPAA.    Specialty medication(s) to be sent: Inflammatory Disorders: methotrexate (injectable)  ($45.31 for 84 days)      Non-specialty medications/supplies to be sent: syringes      Medications not needed at this time: ***         methotrexate injection    Medication & Administration     Dosage: Inject  1 mL (25mg ) subcutaneously every 7 days      Lab tests required prior to treatment initiation:  ??? Pregnancy: Pregnancy status unconfirmed in patient chart but medication prescriber has indicated they are aware and wishing to initiate treatment at this time.      Administration:     Vials and syringes  1. Gather all supplies needed for injection on a clean, flat working surface: medication vial, syringe with needle, alcohol swabs, sharps container, etc.  2. Look at the medication label - look for correct medication, correct dose, and check the expiration date  3. Look at the medication - the liquid in the syringe should appear clear and yellow in color  4. Select injection site - you can use the front of your thigh or your belly (but not the area 2 inches around your belly button)  5. Prepare injection site - wash your hands and clean the skin at the injection site with an alcohol swab and let it air dry, do not touch the injection site again before the injection  6. Pop the protective cap off the top of the vial if it's the first use for that vial and wipe the rubber stopper with an alcohol wipe and allow to air dry  7. Open syringe and needle and carefully remove the needle cap, pull the plunger back to the volume you'll need to inject  8. With the methotrexate vial on a flat surface, insert the needle straight through the middle of the rubber stopper until the needle is all the way down, push the plunger until all the air is injected into the vial  9. Hold the vial and syringe and turn them upside down while keeping the needle in the vial, pull down the plunger to withdraw the desired volume of liquid from the vial, carefully remove the needle from the vial  10. Pinch the skin - with your hand not holding the syringe pinch up a fold of skin at the injection site using your forefinger and thumb  11. Insert the needle into the fold of skin at about a 45 degree angle - it's best to use a quick dart-like motion - with the needle inserted, release the pinch of skin and allow the skin to relax  12. Push the plunger down slowly as far as it will go until the syringe is empty  13. Check that the syringe is empty and carefully pull the needle out at the same angle as inserted; after the needle is removed completely from the skin  14. Dispose of the used syringe immediately in your sharps disposal container  15. If you see any blood at the injection site, press a cotton ball  or gauze on the site and maintain pressure until the bleeding stops, do not rub the injection site      Adherence/Missed dose instructions:  If your injection is given more than 2 days after your scheduled injection date - consult your pharmacist for additional instructions on how to adjust your dosing schedule.    Goals of Therapy     Polyarticular juvenile idiopathic arthritis  ??? Assuring normal growth and development  ??? Avoidance of long-term systemic glucocorticoid use  ??? Protection of remaining articular structures  ??? Maintenance of function  ??? Maintenance of effective psychosocial functioning    Systemic juvenile idiopathic arthritis  ??? Assuring normal growth and development  ??? Avoidance of long-term systemic glucocorticoid use  ??? Protection of remaining articular structures  ??? Maintenance of function  ??? Maintenance of effective psychosocial functioning    Rheumatoid arthritis  ??? Achieve symptom remission  ??? Slow disease progression  ??? Protection of remaining articular structures  ??? Maintenance of function  ??? Maintenance of effective psychosocial functioning    Dermatomyositis and polymyositis  ??? Improve muscle strength  ??? Avoid development of extramuscular complications  ??? Resolution of cutaneous disease manifestations  ??? Maintenance of effective psychosocial functioning    Plaque Psoriasis  ??? Minimize areas of skin involvement (% BSA)  ??? Avoidance of long term glucocorticoid use  ??? Maintenance of effective psychosocial functioning    Systemic lupus erythematosus  ??? Ensure long-term survival  ??? Achieve the lowest possible disease activity  ??? Prevent organ damage  ??? Improve quality of life  ??? Minimize drug toxicity    Sarcoidosis  ??? Prevent or lessen end organ damage  ??? Reduction or clearing of radiographic abnormalities  ??? Minimize long-term glucocorticoid use  ??? Maintenance of effective psychosocial functioning      Side Effects & Monitoring Parameters     ??? Upset stomach, diarrhea, nausea or throwing up  ??? Feeling dizzy, tired, or weak  ??? Hair thinning (reversible)  ??? Minor cold-like symptoms  ??? Photosensitivity - use sunscreen and avoid prolonged exposure    The following side effects should be reported to the provider:  ??? Signs of a hypersensitivity reaction - rash; hives; itching; red, swollen, blistered, or peeling skin; wheezing; tightness in the chest or throat; difficulty breathing, swallowing, or talking; swelling of the mouth, face, lips, tongue, or throat; etc.  ??? Reduced immune function - report signs of infection such as fever; chills; body aches; very bad sore throat; ear or sinus pain; cough; more sputum or change in color of sputum; pain with passing urine; wound that will not heal, etc.  Also at a slightly higher risk of some malignancies (mainly skin and blood cancers) due to this reduced immune function.  o In the case of signs of infection - the patient should hold the next dose of methotrexate and call your primary care provider to ensure adequate medical care.  Treatment may be resumed when infection is treated and patient is asymptomatic.  ??? Signs of bleeding - throwing up or coughing up blood; blood in the urine; black, red, or tarry stool; abnormal vaginal bleeding; bruises without a cause or that get bigger  ??? Signs of pancreatitis - sudden very bad stomach pain, unexplained vomiting  ??? Signs of kidney dysfunction - unable to pass urine; blood in the urine; sudden weight gain  ??? Signs of liver dysfunction - darkened urine; upset stomach; light-colored stool; yellow skin or eyes  ??? Signs of nerve  problems - burning; numbness; tingling  ??? Pinpoint red spots on the skin  ??? Changes in eyesight      Contraindications, Warnings, & Precautions     ??? Have your bloodwork checked as you have been told by your prescriber  ??? Talk with your doctor if you are pregnant, planning to become pregnant, or breastfeeding - women taking this medication must use birth control while taking this drug and for some time after the last dose  ??? Discuss the possible need for holding your dose(s) of methotrexate when a planned procedure is scheduled with the prescriber as it may delay healing/recovery timeline       Drug/Food Interactions     ??? Medication list reviewed in Epic. The patient was instructed to inform the care team before taking any new medications or supplements. {Blank single:19197::No drug interactions identified,***}.     Storage, Handling Precautions, & Disposal     ??? Store intact vials at room temperature  ??? Protect from light  ??? Dispose of used syringes in a sharps disposal container  ??? 50mg /27ml vials contain preservative and can be used as a multi-dose vial to be discarded 30 days after the first puncture 25 MG tablet Take 1 tablet (25 mg total) by mouth nightly. 90 tablet 3   ??? arm brace Misc Boxer splint, metacarpal wrist brace 1 each 0   ??? aspirin 81 MG chewable tablet CHEW 1 TABLET BY MOUTH DAILY 36 tablet 3   ??? buPROPion (WELLBUTRIN XL) 150 MG 24 hr tablet Take 1 tablet (150 mg total) by mouth every morning. 90 tablet 2   ??? dicyclomine (BENTYL) 20 mg tablet Take 1 tablet (20 mg total) by mouth four times a day as needed (stomach cramps). 360 tablet 3   ??? empty container Misc Use as directed 1 each 2   ??? estradioL (ESTRACE) 0.01 % (0.1 mg/gram) vaginal cream Insert 2 g into the vagina Two (2) times a week. Insert 2 g inside vagina and apply a pea-sized amount around urethra twice weekly  0   ??? ferrous sulfate 325 (65 FE) MG EC tablet Take 1 tablet (325 mg total) by mouth every other day. 45 tablet 3   ??? gabapentin (NEURONTIN) 300 MG capsule 3 capsules po morning and midday, 4 capsules po qhs 360 capsule 3   ??? haloperidoL (HALDOL) 0.5 MG tablet Take 1 tablet (0.5 mg total) by mouth daily before breakfast. 90 tablet 3   ??? hydrOXYzine (ATARAX) 10 MG tablet TAKE 1 TABLET (10 MG TOTAL) BY MOUTH 3 TIMES A DAY AS NEEDED FOR ITCHING 270 tablet 1   ??? leg brace (KNEE SUPPORT BRACE) Misc 1 each by Miscellaneous route daily as needed. 1 each 0   ??? methocarbamoL (ROBAXIN) 500 MG tablet Take 1 tablet (500 mg total) by mouth Three times a day. 90 tablet 1   ??? methotrexate sodium (METHOTREXATE, CONTAINS PRESERVATIVES,) 25 mg/mL injection solution Inject 1 mL (25 mg total) under the skin once a week. 12 mL 1   ??? needle, disp, 25 gauge 25 gauge x 5/8 Ndle For methotrexate injections (Patient not taking: Reported on 11/23/2021) 100 each 0   ??? nicotine (NICODERM CQ) 14 mg/24 hr patch Place 1 patch on the skin daily. 28 patch 1   ??? nicotine (NICODERM CQ) 14 mg/24 hr patch Place 1 patch on the skin daily. 28 patch 0   ??? predniSONE (DELTASONE) 10 MG tablet Take 4 tablets (40 mg total) by mouth daily for  5 days, THEN 3 tablets (30 mg total) daily for 5 days, THEN 2 tablets (20 mg total) daily for 5 days, THEN 1.5 tablets (15 mg total) daily for 5 days, THEN 1 tablet (10 mg total) daily for 14 days, THEN 0.5 tablets (5 mg total) daily for 14 days. 74 tablet 0   ??? syringe with needle (MONOJECT TB SAFETY SYRINGE) 1 mL 25 gauge x 5/8 Syrg Use as directed with methotrexate injections. 100 each 0   ??? VITAMIN B-1 100 mg tablet Take 1 tablet (100 mg total) by mouth daily. 100 tablet 3     No current facility-administered medications for this visit.       No Known Allergies    Patient Active Problem List   Diagnosis   ??? Tobacco use disorder   ??? GERD (gastroesophageal reflux disease)   ??? Gastritis   ??? Functional Abdominal pain   ??? Anxiety   ??? Depression   ??? Rheumatoid arthritis (CMS-HCC)   ??? Atrial flutter (CMS-HCC)   ??? Immunosuppressed status (CMS-HCC)   ??? Rheumatoid arthritis involving both feet (CMS-HCC)   ??? 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 Piriformis syndrome of both sides   ??? Spinal stenosis of lumbar region with neurogenic claudication   ??? Spondylolisthesis of lumbar region   ??? Duodenal ulcer disease   ??? Paraesophageal hernia   ??? Osteomyelitis (CMS-HCC)   ??? Slow transit constipation       Reviewed and up to date in Epic.    Appropriateness of Therapy     Acute infections noted within Epic:  No active infections  Patient reported infection: None    Is medication and dose appropriate based on diagnosis and infection status? Yes    Prescription has been clinically reviewed: Yes      Baseline Quality of Life Assessment      How many days over the past month did your RA  keep you from your normal activities? For example, brushing your teeth or getting up in the morning. having flare now, will get prednisone locally    Financial Information     Medication Assistance provided: None Required    Anticipated copay of $0/28 days through PAP reviewed with patient. Verified delivery address.    Delivery Information     Scheduled delivery date: 9/7    Expected start date: 9/7    Medication will be delivered via UPS to the prescription address in [facility].  This shipment will not require a signature.      Explained the services we provide at [facility] and that each month we would call to set up refills.  Stressed importance of returning phone calls so that we could ensure they receive their medications in time each month.  Informed patient that we should be setting up refills 7-10 days prior to when they will run out of medication.  A pharmacist will reach out to perform a clinical assessment periodically.  Informed patient that a welcome packet, containing information about our pharmacy and other support services, a Notice of Privacy Practices, and a drug information handout will be sent.      The patient or caregiver noted above participated in the development of this care plan and knows that they can request review of or adjustments to the care plan at any time.      Patient or caregiver verbalized understanding of the above information as well as how to contact the pharmacy  at [phone] option 4 with any questions/concerns.  The pharmacy is open Monday through Friday 8:30am-4:30pm.  A pharmacist is available 24/7 via pager to answer any clinical questions they may have.    Patient Specific Needs     - Does the patient have any physical, cognitive, or cultural barriers? No    - Does the patient have adequate living arrangements? (i.e. the ability to store and take their medication appropriately) Yes    - Did you identify any home environmental safety or security hazards? No    - Patient prefers to have medications discussed with  Patient     - Is the patient or caregiver able to read and understand education materials at a high school level or above? Yes    - Patient's primary language is  English     - Is the patient high risk? No    SOCIAL DETERMINANTS OF HEALTH     At the [facility], we have learned that life circumstances - like trouble affording food, housing, utilities, or transportation can affect the health of many of our patients.   That is why we wanted to ask: are you currently experiencing any life circumstances that are negatively impacting your health and/or quality of life? Patient declined to answer    Social Determinants of Health     Financial Resource Strain: Low Risk  (12/13/2020)    Overall Financial Resource Strain (CARDIA)    ??? Difficulty of Paying Living Expenses: Not very hard   Internet Connectivity: Not on file   Food Insecurity: No Food Insecurity (12/13/2020)    Hunger Vital Sign    ??? Worried About Running Out of Food in the Last Year: Never true    ??? Ran Out of Food in the Last Year: Never true   Tobacco Use: High Risk (01/16/2022)    Patient History    ??? Smoking Tobacco Use: Every Day    ??? Smokeless Tobacco Use: Never    ??? Passive Exposure: Not on file   Housing/Utilities: Unknown (12/16/2021)    Housing/Utilities    ??? Within the past 12 months, have you ever stayed: outside, in a car, in a tent, in an overnight shelter, or temporarily in someone else's home (i.e. couch-surfing)?: Patient refused    ??? Are you worried about losing your housing?: Not on file    ??? Within the past 12 months, have you been unable to get utilities (heat, electricity) when it was really needed?: Not on file   Alcohol Use: Not At Risk (07/11/2020)    Alcohol Use    ??? How often do you have a drink containing alcohol?: Never    ??? How many drinks containing alcohol do you have on a typical day when you are drinking?: 1 - 2    ??? How often do you have 5 or more drinks on one occasion?: Never   Transportation Needs: No Transportation Needs (12/13/2020)    PRAPARE - Transportation    ??? Lack of Transportation (Medical): No    ??? Lack of Transportation (Non-Medical): No   Substance Use: Low Risk  (07/11/2020)    Substance Use    ??? Taken prescription drugs for non-medical reasons: Never    ??? Taken illegal drugs: Never    ??? Patient indicated they have taken drugs in the past year for non-medical reasons: Yes, [positive answer(s)]: Not on file   Health Literacy: Not on file (08/24/2020)   Physical Activity: Unknown (02/02/2019)  Exercise Vital Sign    ??? Days of Exercise per Week: Patient refused    ??? Minutes of Exercise per Session: Patient refused   Interpersonal Safety: Not on file   Stress: Unknown (02/02/2019)    [employer] of Occupational Health - Occupational Stress Questionnaire    ??? Feeling of Stress : Patient refused   Intimate Partner Violence: Not At Risk (07/11/2020)    Humiliation, Afraid, Rape, and Kick questionnaire    ??? Fear of Current or Ex-Partner: No    ??? Emotionally Abused: No    ??? Physically Abused: No    ??? Sexually Abused: No   Depression: At risk (08/22/2020)    PHQ-2    ??? PHQ-2 Score: 5   Social Connections: Unknown (02/02/2019)    Social Connection and Isolation Panel [NHANES]    ??? Frequency of Communication with Friends and Family: Patient refused    ??? Frequency of Social Gatherings with Friends and Family: Patient refused    ??? Attends Religious Services: Patient refused    ??? Active Member of Clubs or Organizations: Patient refused    ??? Attends Banker Meetings: Patient refused    ??? Marital Status: Patient refused       Would you be willing to receive help with any of the needs that you have identified today? Not applicable       Julianne Rice  [facility] Specialty Pharmacist refused    ??? Frequency of Social Gatherings with Friends and Family: Patient refused    ??? Attends Religious Services: Patient refused    ??? Active Member of Clubs or Organizations: Patient refused    ??? Attends Banker Meetings: Patient refused    ??? Marital Status: Patient refused       Would you be willing to receive help with any of the needs that you have identified today? {Yes/No/Not applicable:93005}       Julianne Rice  [facility] Specialty Pharmacist

## 2022-01-22 DIAGNOSIS — M069 Rheumatoid arthritis, unspecified: Principal | ICD-10-CM

## 2022-01-22 MED ORDER — PREDNISONE 10 MG TABLET
ORAL_TABLET | ORAL | 0 refills | 48 days | Status: CP
Start: 2022-01-22 — End: 2022-03-11

## 2022-01-23 MED FILL — BD TUBERCULIN SYRINGE 1 ML 25 GAUGE X 5/8": 28 days supply | Qty: 4 | Fill #0

## 2022-01-23 MED FILL — METHOTREXATE SODIUM (CONTAINS PRESERVATIVES) 25 MG/ML INJECTION SOLUTION: SUBCUTANEOUS | 28 days supply | Qty: 4 | Fill #0

## 2022-01-24 LAB — VITAMIN D 25 HYDROXY: VITAMIN D, TOTAL (25OH): 25.7 ng/mL (ref 20.0–80.0)

## 2022-01-25 IMAGING — CT CT L SPINE W/O CM
3 of 4 series · 10 of 33 positions shown, 12 images · non-contrast
Comparison: Prior radiograph from 10/11/2020.

CLINICAL DATA: Initial evaluation for acute back and abdominal pain
after recent spinal surgery.

EXAM:
CT LUMBAR SPINE WITHOUT CONTRAST
TECHNIQUE: Multidetector CT imaging of the lumbar spine was performed without
intravenous contrast administration. Multiplanar CT image
reconstructions were also generated.

[Series 8: l-spine_ax · axial · 0.28mm/px · z∈[+1115,+1193]mm · 2 of 119 slices shown, 3 images]
[im 40/119  soft-tissue]
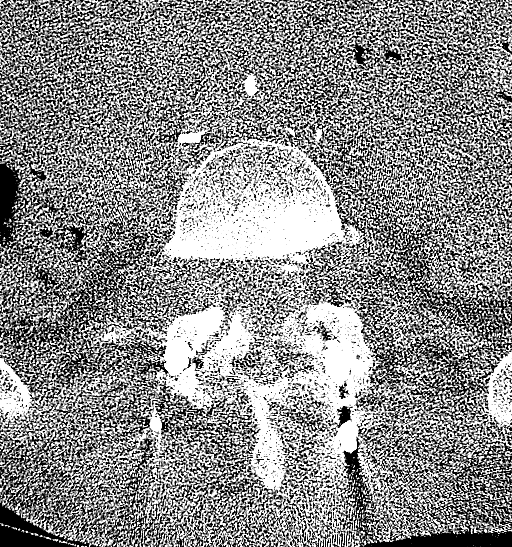
[im 40/119  bone]
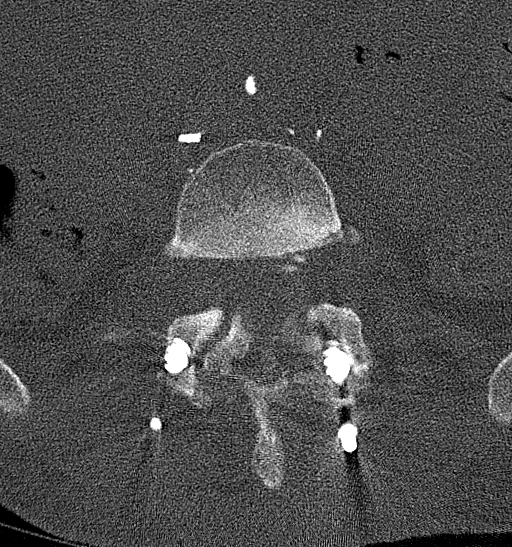
[im 79/119  bone]
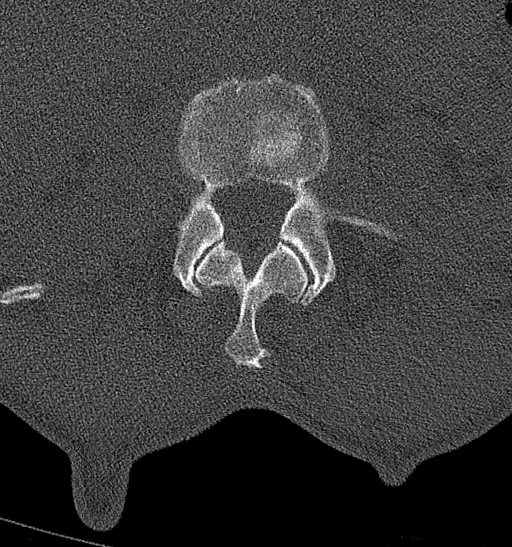

[Series 10: l-spine_ax cor · coronal · 0.27mm/px · 3 of 81 slices shown]
[im 17/81  bone]
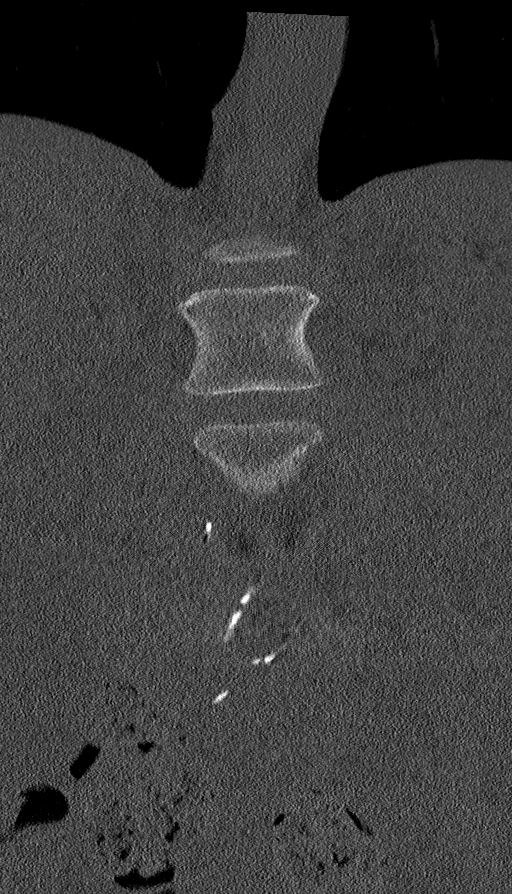
[im 33/81  bone]
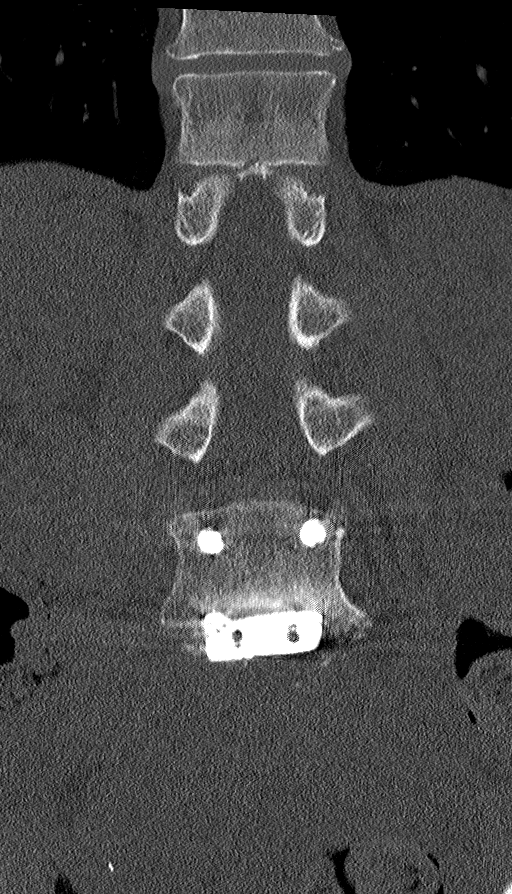
[im 49/81  bone]
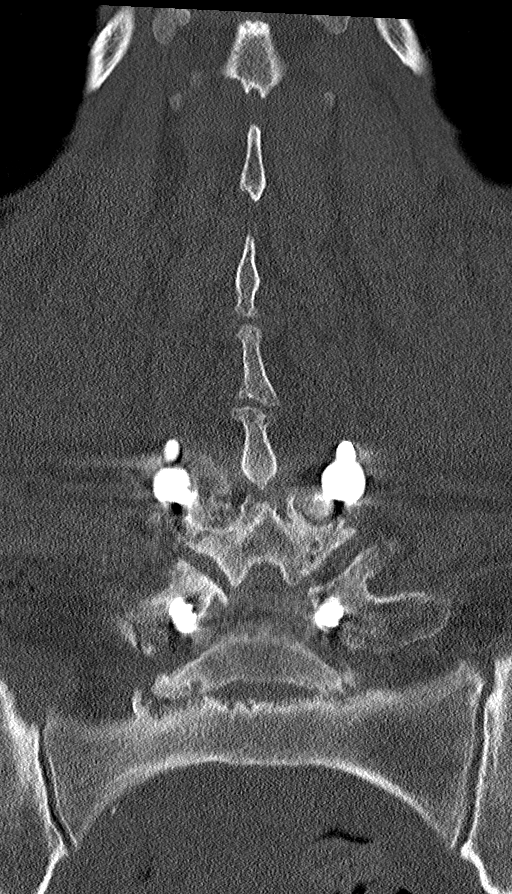

[Series 11: l-spine_ax sag · sagittal · 0.32mm/px · 5 of 69 slices shown, 6 images]
[im 23/69  bone]
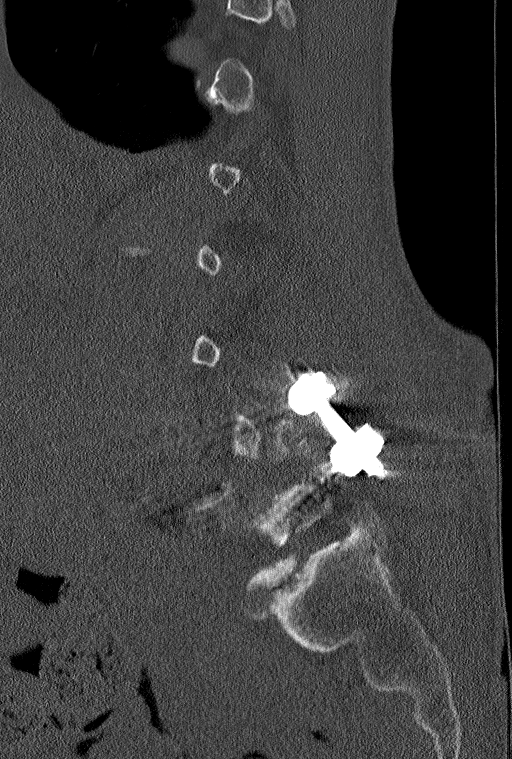
[im 29/69  bone]
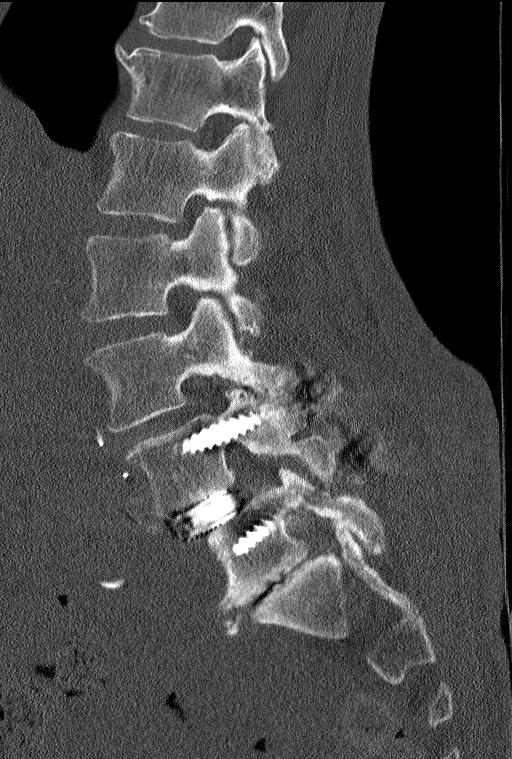
[im 35/69  soft-tissue]
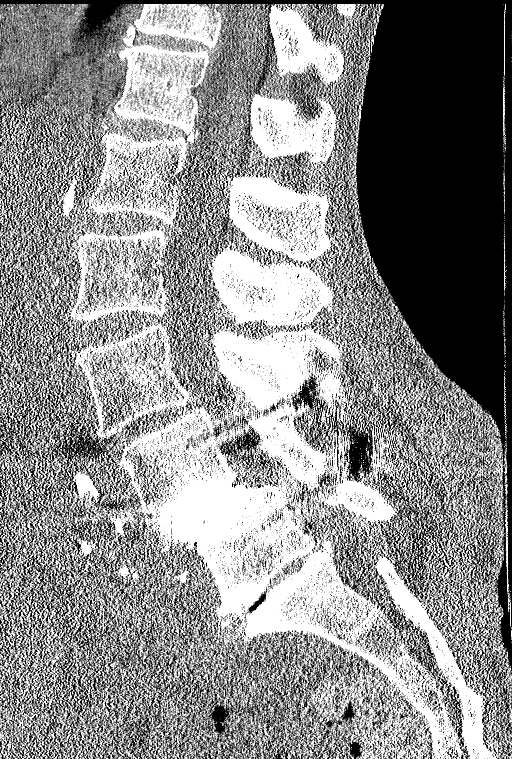
[im 35/69  bone]
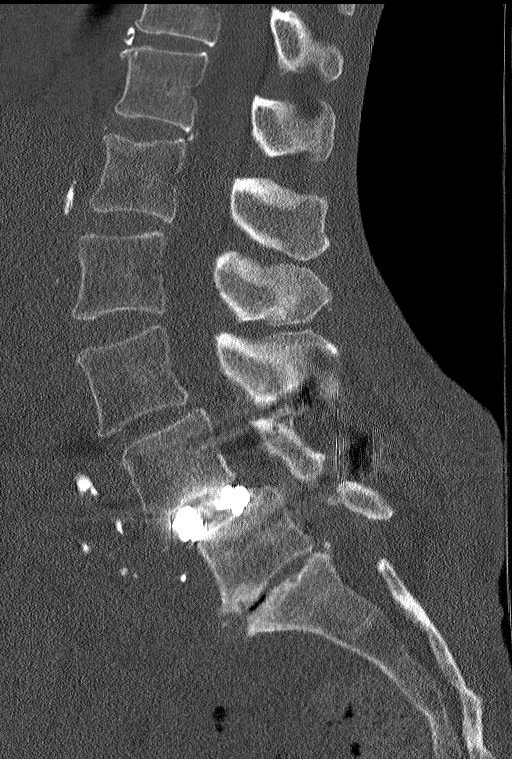
[im 40/69  bone]
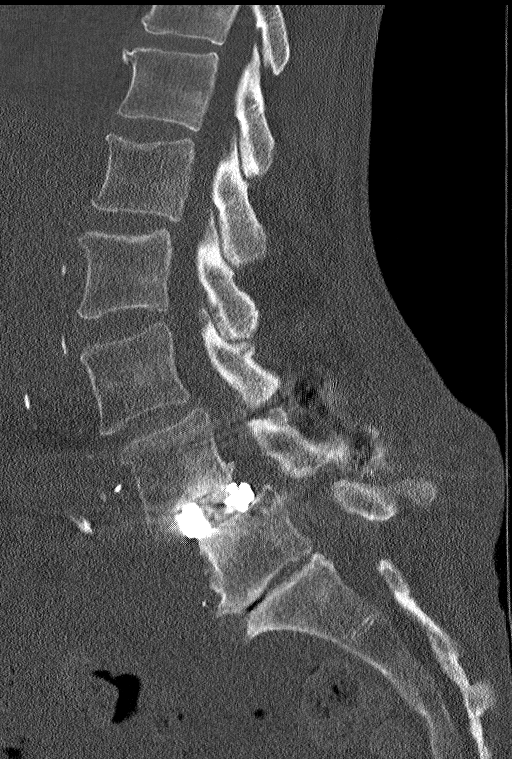
[im 46/69  bone]
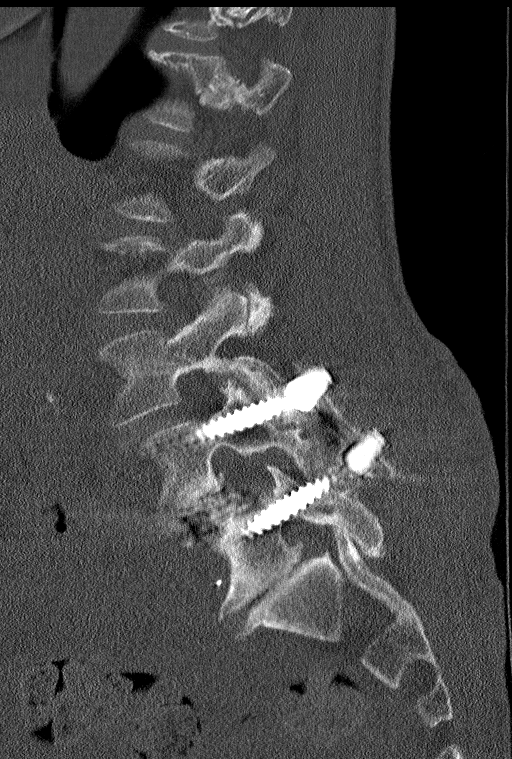

[10 of 33 positions shown; findings below may reference images not displayed]

FINDINGS: Segmentation: Standard. Lowest well-formed disc space labeled the
L5-S1 level.

Alignment: 8 mm anterolisthesis of L4 on L5, slightly improved as
compared to previous exam. Trace 3 mm anterolisthesis of L3 on L4,
with 2 mm retrolisthesis of L1 on L2, otherwise stable.

Vertebrae: Postoperative changes from interval PLIF at L4-5.
Hardware appears well positioned and intact. No abnormal
periprosthetic lucency. There is an acute nondisplaced fracture
extending through the right transverse process of L5 (series 10,
image 49), new from prior. No other acute fracture. Mild height loss
at the superior endplate of L1 is stable and chronic in nature.
Visualized sacrum and pelvis intact. SI joints symmetric and normal.
No worrisome osseous lesions.

Paraspinal and other soft tissues: Normal expected postoperative
changes within the posterior paraspinous soft tissues. No visible
collections. Aorto bi-iliac atherosclerotic disease noted. Bilateral
nonobstructive nephrolithiasis. Prominent stool within the colon,
suggesting constipation.

Disc levels:

T12-L1: Mild disc bulge with annular calcification. No spinal
stenosis. Foramina remain patent.

L1-2: Trace retrolisthesis. Degenerative intervertebral disc space
narrowing with mild disc bulge. Moderate facet hypertrophy. No
spinal stenosis. Mild bilateral L1 foraminal narrowing.

L2-3: Left foraminal to extraforaminal disc protrusion (series 8,
image 52), which could potentially affect the exiting left L2 nerve
root. Moderate bilateral facet hypertrophy. No significant spinal
stenosis. Foramina remain patent.

L3-4: 3 mm anterolisthesis. Degenerative intervertebral disc space
narrowing with mild disc bulge. Superimposed broad right foraminal
disc protrusion (series 9, image 66), closely approximating the
exiting right L3 nerve root. Moderate facet hypertrophy. No more
than mild canal with bilateral lateral recess stenosis. Mild right
L3 foraminal narrowing. Left neural foramina remains patent.

L4-5: 8 mm anterolisthesis. Interval PLIF. Moderate facet
hypertrophy. No obvious abnormality about the spinal canal or
significant residual stenosis. Foramina appear patent.

L5-S1: Advanced degenerative intervertebral disc space narrowing
with disc desiccation and diffuse disc bulge, eccentric to the
right. Associated reactive endplate spurring. Mild facet
hypertrophy. No significant spinal stenosis. Mild bilateral L5
foraminal narrowing.
IMPRESSION: 1. Acute nondisplaced fracture extending through the right
transverse process of L5.
2. Postoperative changes from interval PLIF at L4-5 with no other
complication.
3. Right foraminal disc protrusion at L3-4, closely approximating
the exiting right L3 nerve root.
4. Left foraminal to extraforaminal disc protrusion at L2-3, which
could potentially affect the exiting left L2 nerve root.
5. Bilateral nonobstructive nephrolithiasis.
6. Aortic atherosclerosis.

Aortic Atherosclerosis ([id number]-B30.0).

## 2022-02-01 ENCOUNTER — Ambulatory Visit: Admit: 2022-02-01 | Discharge: 2022-02-02

## 2022-02-01 DIAGNOSIS — Z1239 Encounter for other screening for malignant neoplasm of breast: Principal | ICD-10-CM

## 2022-02-01 DIAGNOSIS — Z23 Encounter for immunization: Principal | ICD-10-CM

## 2022-02-01 DIAGNOSIS — R200 Anesthesia of skin: Principal | ICD-10-CM

## 2022-02-01 DIAGNOSIS — M05771 Rheumatoid arthritis with rheumatoid factor of right ankle and foot without organ or systems involvement: Principal | ICD-10-CM

## 2022-02-01 DIAGNOSIS — G5793 Unspecified mononeuropathy of bilateral lower limbs: Principal | ICD-10-CM

## 2022-02-01 DIAGNOSIS — M05772 Rheumatoid arthritis with rheumatoid factor of left ankle and foot without organ or systems involvement: Principal | ICD-10-CM

## 2022-02-01 DIAGNOSIS — M069 Rheumatoid arthritis, unspecified: Principal | ICD-10-CM

## 2022-02-01 DIAGNOSIS — L84 Corns and callosities: Principal | ICD-10-CM

## 2022-02-01 NOTE — Unmapped (Signed)
[facility] Medicine Clinic    Return Visit    Face to Face    Assessment and Plan:    1. Hand numbness    2. Neuropathy of both feet    3. Rheumatoid arthritis, involving unspecified site, unspecified whether rheumatoid factor present (CMS-HCC)    4. Callus of foot    5. 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 Encounter for screening for malignant neoplasm of breast, unspecified screening modality      HCM mammography discussed and ordered.  covid booster at pharmacy.  Influenza vaccine today    RA back on methotrexate, stable    CTS bl, L>R  Wear wrist braces and elbow brace  continue gabapentin from 900 mg tid to 900 mg qam and midday, 1200 mg at bedtime  She has weakness and extensive numbness  Suspect she may be headed toward CTS surgery  Will message hand specialist  She will also need nerve conduction study most likely (already ordered by Ortho)    Callus of feet  Podiatry referral placed  Gave # to call    Insomnia  Remeron did not help, not currently using  Hasn't started amitriptyline.  Add this, 25 mg at bedtime.  Titrate if needed    Osteoporosis  Back on fosamax    Spine stenosis  Symptoms much better after surgery  She continues gabapentin and more rarely methocarbamol    Tobacco abuse  Using NRT some now    Fu 3-6 mo    I spent a total of 35 min total including pre intra and post visit activities    Medication adherence and barriers to the treatment plan have been addressed. Opportunities to optimize healthy behaviors have been discussed. Patient / caregiver voiced understanding.      _____________________________________________________________________    Chief Complaint:  Follow up    Patient Active Problem List    Diagnosis Date Noted    Slow transit constipation 09/18/2019    Duodenal ulcer disease 05/08/2019    Paraesophageal hernia 05/08/2019    Spinal stenosis of lumbar region with neurogenic claudication 09/18/2018    Spondylolisthesis of lumbar region 09/18/2018    Piriformis syndrome of both sides 03/11/2018    Stage 1 mild COPD by GOLD classification (CMS-HCC) 02/25/2017    Rheumatoid arthritis involving both feet (CMS-HCC)     Immunosuppressed status (CMS-HCC) 01/27/2016    Osteomyelitis (CMS-HCC) 01/26/2016    Atrial flutter (CMS-HCC) 12/25/2015    Rheumatoid arthritis (CMS-HCC) 01/11/2015    Anxiety 02/10/2013    Depression 02/10/2013    Functional Abdominal pain 08/29/2012    Gastritis 08/26/2012    GERD (gastroesophageal reflux disease) 08/24/2012    Tobacco use disorder 01/01/2011       HPI:  Bethany Meyer is a pleasant 66 yo female here for follow up.    Left hand steady numb for 6 months.  Seeing specialist.  Constant numb and tingling can't rest arm.  Has wrist braces and arm brace to wear during the day.      Def feels like strength is affected.  Grasping stuff, just slides right out of hand.    Thinks worse past month or two.      Prednisone helped with swelling in the hand    Feet bottom of feet feels like hard rock, feet feel numb on the floor    Feel it in every shoe    No sores    Continues low dose prednisone  for RA and that is helping some    Can't sleep well due to pain, but also has chronic insomnia    Feet feet numb at times but most bothersome due to these calluses      Current Outpatient Medications on File Prior to Visit   Medication Sig    acetaminophen (TYLENOL) 325 MG tablet Take 1 tablet (325 mg total) by mouth two (2) times a day as needed for pain.    alendronate (FOSAMAX) 70 MG tablet Take 1 tablet (70 mg total) by mouth every seven (7) days.    amitriptyline (ELAVIL) 25 MG tablet Take 1 tablet (25 mg total) by mouth nightly.    arm brace Misc Boxer splint, metacarpal wrist brace    aspirin 81 MG chewable tablet CHEW 1 TABLET BY MOUTH DAILY    buPROPion (WELLBUTRIN XL) 150 MG 24 hr tablet Take 1 tablet (150 mg total) by mouth every morning.    dicyclomine (BENTYL) 20 mg tablet Take 1 tablet (20 mg total) by mouth four times a day as needed (stomach cramps).    empty container Misc Use as directed    estradioL (ESTRACE) 0.01 % (0.1 mg/gram) vaginal cream Insert 2 g into the vagina Two (2) times a week. Insert 2 g inside vagina and apply a pea-sized amount around urethra twice weekly    ferrous sulfate 325 (65 FE) MG EC tablet Take 1 tablet (325 mg total) by mouth every other day.    gabapentin (NEURONTIN) 300 MG capsule 3 capsules po morning and midday, 4 capsules po qhs    haloperidoL (HALDOL) 0.5 MG tablet Take 1 tablet (0.5 mg total) by mouth daily before breakfast.    hydrOXYzine (ATARAX) 10 MG tablet TAKE 1 TABLET (10 MG TOTAL) BY MOUTH 3 TIMES A DAY AS NEEDED FOR ITCHING    leg brace (KNEE SUPPORT BRACE) Misc 1 each by Miscellaneous route daily as needed.    methocarbamoL (ROBAXIN) 500 MG tablet Take 1 tablet (500 mg total) by mouth Three times a day.    methotrexate sodium (METHOTREXATE, CONTAINS PRESERVATIVES,) 25 mg/mL injection solution Inject 1 mL (25 mg total) under the skin once a week.    needle, disp, 25 gauge 25 gauge x 5/8 Ndle For methotrexate injections (Patient not taking: Reported on 11/23/2021)    nicotine (NICODERM CQ) 14 mg/24 hr patch Place 1 patch on the skin daily.    nicotine (NICODERM CQ) 14 mg/24 hr patch Place 1 patch on the skin daily.    [EXPIRED] nicotine polacrilex (NICORETTE) 2 mg gum Apply 1 each (2 mg total) to cheek every two (2) hours as needed for smoking cessation.    predniSONE (DELTASONE) 10 MG tablet Take 4 tablets (40 mg total) by mouth daily for 5 days, THEN 3 tablets (30 mg total) daily for 5 days, THEN 2 tablets (20 mg total) daily for 5 days, THEN 1.5 tablets (15 mg total) daily for 5 days, THEN 1 tablet (10 mg total) daily for 14 days, THEN 0.5 tablets (5 mg total) daily for 14 days.    syringe with needle (MONOJECT TB SAFETY SYRINGE) 1 mL 25 gauge x 5/8 Syrg Use as directed with methotrexate injections.    VITAMIN B-1 100 mg tablet Take 1 tablet (100 mg total) by mouth daily.     No current facility-administered medications on file prior to visit.        Review of Systems -  HPI    Exam:  BP 118/71 (BP  Site: L Arm, BP Position: Sitting, BP Cuff Size: Medium)  - Pulse 74  - Temp 36 ??C (96.8 ??F) (Temporal)  - Ht 167.6 cm (5' 5.98)  - Wt 57.5 kg (126 lb 12.8 oz)  - LMP 05/20/2005  - SpO2 98%  - BMI 20.48 kg/m??     Gen:  Pleasant in NAD  Hands  Good rom  2+ radial pulses bl  Left with decreased strength 3/5  Some thenar wasting left  + phallen test left  R hand normal strength  Feet  Good pt pulses bl   No edema  Calluses under feet very thickened  1st MTP joint right with great medial deviation      Laural Benes, PA-C  Supervising physician Gabrielle Dare, MD

## 2022-02-01 NOTE — Unmapped (Addendum)
Try amitriptyline for sleep and nerve pain  I will contact the hand clinic about a follow up visit and scheduling your nerve conduction study  Referral to podiatry to help treat calluses on feet call [phone]  Get you covid booster at [facility] within the next week if you can  Flu shot today

## 2022-02-04 ENCOUNTER — Ambulatory Visit
Admit: 2022-02-04 | Discharge: 2022-02-05 | Attending: Student in an Organized Health Care Education/Training Program | Primary: Student in an Organized Health Care Education/Training Program

## 2022-02-04 DIAGNOSIS — H25813 Combined forms of age-related cataract, bilateral: Principal | ICD-10-CM

## 2022-02-04 NOTE — Unmapped (Signed)
Not seen by provider.  Will reschedule.

## 2022-02-13 NOTE — Unmapped (Signed)
Patient left a message on nurse triage regarding next steps since the imaging of left hand/wrist was completed on 12/25/2021.    This nurse gave the number to the Hand team with [facility] for guidance as mentioned in chart note from 11/15/2021 visit.  [phone].  Patient was also advised to call back and schedule if she was told to follow up with PCP

## 2022-02-17 DIAGNOSIS — M0579 Rheumatoid arthritis with rheumatoid factor of multiple sites without organ or systems involvement: Principal | ICD-10-CM

## 2022-02-19 NOTE — Unmapped (Signed)
Bethany Meyer has been contacted in regards to their refill of methotrexate. At this time, they have declined refill due to  patient would like to discuss with Dr. Tomasa Rand at 10/12 appointment . Refill assessment call date has been updated per the patient's request. Patient as 2 doses on hand and will inject 10/5 and 10/12.

## 2022-02-20 MED FILL — DICYCLOMINE 20 MG TABLET: ORAL | 90 days supply | Qty: 360 | Fill #2

## 2022-02-20 NOTE — Unmapped (Signed)
Called patient to touch base about MTX and Ritux. No answer, left VM.

## 2022-02-25 ENCOUNTER — Ambulatory Visit: Admit: 2022-02-25 | Discharge: 2022-02-26 | Payer: MEDICARE

## 2022-02-25 NOTE — Unmapped (Signed)
Bethany Meyer is a 66 y.o. female w/ pmhx of Rheumatoid Arthritis presenting for cataract evaluation    # Cataracts, OU  -- Visually significant  -- Difficulty with: glare and halos that significantly impairs driving  -- Coexisting ocular disease: None  -- Lens:   - OD: 2+ Nuclear sclerosis, 2+ Cortical cataract   - OS: 1 to 2 + Nuclear sclerosis, 1+ Cortical cataract  -- Pupil dilation:   - OD: 6 mm   - OS: 6 mm  -- Pseudoexfoliation?: absent  -- FECD?: absent  -- H/o Trauma?: absent  -- Phacodenesis?: absent  -- Flomax Use?: absent  -- Allergies to iodine or latex: no  -- CL use?: Yes   -- Prior refractive surgery?: no  -- BCVA?: OD: 20/40  OS: 20/30  -- BAT: 20/400 OD, 20/50 OS   --Insurance Status:  Not recorded        Not recorded       Not recorded       Manifest Refraction       Manifest Refraction         Sphere Cylinder Axis Dist VA    Right -4.50 +0.75 145 20/40+    Left -4.25 +0.25 059 20/30                    Plan:  -RTC in 2 weeks for repeat calcs as patient has history of recent contact lens use  -CE/IOL OD Only, will decide OS later (Tentative date 05/29/2022)    Patient was seen with Dr. Jules Schick, MD   PGY-3 [facility]    Surgical Plan Right Eye: 05/29/2022 Barton Fanny)  Indication: Cataract is primary cause of the symptomatic impairment of visual function not correctable with glasses or contact lenses. Therefore, to improve vision for patient's documented visual complaints recommend phaco and pcIOL of the right eye. There is a reasonable expectation that surgery will significantly improve the visual and functional status of the patient.  CPT Code: 16109  Anesthesia: MAC + Topical+Pre-Op Block  Length of surgery: 45 mins   Resident Selected IOL: Pending  Special Needs: Epi-shugarcaine, Trypan Blue     Target refraction: [employer]: After much discussion, the patient felt would like to use both eyes for distance so we will aim for plano target refraction.        Surgical Counseling:   - The risks, benefits, and alternatives of surgery were discussed. Risks includes but are not limited to infection, bleeding, cataract or intraocular lens dislocation, glaucoma, retinal detachment, need for multiple surgeries, chronic inflammation, chronic pain, ptosis, diplopia, disfigurement, blindness, loss of eye, and other grave unforeseeable disability. The patient understands the risk of surgery and wishes to proceed. All questions were answered.

## 2022-02-26 ENCOUNTER — Ambulatory Visit: Admit: 2022-02-26 | Payer: MEDICARE

## 2022-02-28 ENCOUNTER — Ambulatory Visit
Admit: 2022-02-28 | Discharge: 2022-03-01 | Payer: MEDICARE | Attending: Student in an Organized Health Care Education/Training Program | Primary: Student in an Organized Health Care Education/Training Program

## 2022-02-28 DIAGNOSIS — G5602 Carpal tunnel syndrome, left upper limb: Principal | ICD-10-CM

## 2022-02-28 DIAGNOSIS — M069 Rheumatoid arthritis, unspecified: Principal | ICD-10-CM

## 2022-02-28 MED ORDER — METHOTREXATE SODIUM (CONTAINS PRESERVATIVES) 25 MG/ML INJECTION SOLUTION
SUBCUTANEOUS | 1 refills | 84 days | Status: CP
Start: 2022-02-28 — End: 2022-08-27

## 2022-02-28 MED ORDER — FOLIC ACID 1 MG TABLET
ORAL_TABLET | Freq: Every day | ORAL | 3 refills | 90 days | Status: CP
Start: 2022-02-28 — End: 2023-02-28

## 2022-02-28 NOTE — Unmapped (Signed)
Assessment/Plan:   Bethany Meyer is a 66 y.o. 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    1. Seropositive (+CCP) non erosive RA   Active today with CDAI of 24, this seems to be complicated by CTS. We discussed that re-initiating her DMARDs will be the ultimate therapy but could explore CTS injection with ortho for more immediate relief. We also explored the importance of smoking cessation.  - Continue MTX 25mg  SQ (will explore pens as she has difficulty with fine motor skills 2/2 RA)  - Continue FA daily  - Plan remains to start Ritux, will re-explore this  - Labs at next visit    2.  Spinal stenosis  Patient with an MRI from 12/24/19 demonstrating severe spinal canal stenosis and foraminal narrowing at L4-5. Now with dramatic improvement after spinal surgery.     3.  Osteoporosis - Fall resulting in right proximal femur greater trochanteric fracture  Dexa 02/2021 with left hip T score -2.7 c/w osteoporosis. Discussed therapeutic options today, she had planned to start Fosamax but only took this for a couple weeks. She would like to restart. Again, reviewed dosing, side effect profile, and administration instructions. Vit D and Ca wnl.  - Continue Fosamax qweekly    4. Tobacco use  She was smoking at least a pack per day, now down to ~8/day.   -- smoking cessation encouraged     5. Normocytic anemia   --will check CBC today.     I personally spent 31 minutes face-to-face and non-face-to-face in the care of this patient, which includes all pre, intra, and post visit time on the date of service.  All documented time was specific to the E/M visit and does not include any procedures that may have been performed.    Health Care Maintenance:  Routine health maintenance discussed  Immunization History   Administered Date(s) Administered Comments   ??? COVID-19 VAC,BIVALENT(32YR UP),PFIZER 05/03/2021    ??? 11/01/2021    ??? COVID-19 VACCINE,MRNA(MODERNA)(PF) 06/29/2019 Historical - Not administered in Epic   ???  07/27/2019 Historical - Not administered in Epic   ???  04/07/2020    ??? DTP 12/22/1962 Historical - Not administered in Epic   ???  02/04/1963 Historical - Not administered in Epic   ???  07/22/1963 Historical - Not administered in Epic   ??? HEPATITIS B VACCINE ADULT,IM(ENERGIX B, RECOMBIVAX) 01/03/2015    ???  03/13/2015    ??? Hepatitis B Vaccine, Unspecified Formulation 08/19/1996 Historical - Not administered in Epic   ???  02/24/1997 Historical - Not administered in Epic   ??? INFLUENZA QUAD HIGH DOSE 21YRS+(FLUZONE) 03/03/2019    ??? Influenza Vaccine Quad (IIV4 PF) 79mo+ injectable 02/10/2013    ???  05/03/2014    ???  03/13/2015    ???  03/20/2016    ???  07/12/2017    ???  03/11/2018    ???  02/14/2020    ???  05/03/2021    ???  02/01/2022    ??? Influenza Virus Vaccine, unspecified formulation 02/10/2013    ???  05/03/2014    ???  03/13/2015    ???  03/20/2016    ???  07/12/2017    ???  03/11/2018    ??? MMR 12/07/1998 Historical - Not administered in Epic   ???  12/20/1998 Historical - Not administered in Epic   ???  04/04/1999 Historical - Not administered in Epic   ???  Meningococcal Conjugate MCV4P 05/03/2021    ??? OPV 07/19/1962 Historical - Not administered in Epic   ???  09/13/1962 Historical - Not administered in Epic   ???  12/22/1962 Historical - Not administered in Epic   ??? PNEUMOCOCCAL POLYSACCHARIDE 23 01/11/2015    ??? Pneumococcal Conjugate 20-valent 11/01/2021    ??? Pneumococcal conjugate -PCV7 02/18/2012    ??? TD(TDVAX),ADSORBED,2LF(IM)(PF) 12/07/1998 Historical - Not administered in Epic   ??? TdaP 01/01/2011    Deferred Date(s) Deferred Comments   ??? Influenza Vaccine Quad (IIV4 PF) 24mo+ injectable 05/17/2019    ???  06/24/2019 Pt remember she had a dose already last year.  See immunization record.       Diagnoses and all orders for this visit:  Rheumatoid arthritis, involving unspecified site, unspecified whether rheumatoid factor present (CMS-HCC)  - methotrexate sodium (METHOTREXATE, CONTAINS PRESERVATIVES,) 25 mg/mL injection solution; Inject 1 mL (25 mg total) under the skin once a week.  - Ambulatory referral to Orthopedic Surgery; Future    Carpal tunnel syndrome of left wrist  - Ambulatory referral to Orthopedic Surgery; Future      I appreciate the opportunity to participate and collaborate in the care of this patient.      History of Present Illness:     Primary Care Provider: Leda Min, PA    HPI:  Bethany Meyer is a 66 y.o.  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    Today  Got 4 doses of MTX, last dosed last Saturday. She would like to try and get the pens again as she previously used. Having some difficulty with the syringes and vials due to her hands. Having worsening numbness in her 1-3 fingers on L mainly. Feels weaker on this side. She was changing insurances so she did not pursue the rituximab.         As background  Bethany Meyer's joint pain first started around May/June 2016 with right knee pain and swelling, then right foot and ankle swelling. She is RF negative, CCP positive. She was started on methotrexate but had persistent joint pain and swelling so was started on Humira in March 2017. She is on methotrexate 20mg  weekly po (have been unable to get SQ approved by insurance).  She was increased to weekly Humira but continued to have joint pain and swelling.  Enbrel was not approved by insurance so prescription was sent for Bethany Meyer instead in 2018. Bethany Meyer was initially effective but the patient experienced several flares while on this medication. This was discontinued in May 2019 and she was started on Cimzia in June 2019. Cimzia was discontinued in summer 2020 due to lack of perceived benefit from medication.    Weight Change Past 6 Months in Pounds (rounded): -1.2 at 03/02/2022 12:59 PM    Balance of 10 systems was reviewed and is negative except for that mentioned in the HPI.    Review of records: I have reviewed labs/images/clinic notes per the computerized medical record.   X-ray: IMPRESSION:  -Subtle cortical irregularity at the second middle phalanx at the PIP joint, which is only visualized on oblique views and may be artifactual. Recommend correlation with point tenderness if fracture in this region is suspected.    Allergies:  Patient has no known allergies.    Medications:     Current Outpatient Medications:   ???  acetaminophen (TYLENOL) 325 MG tablet, Take 1 tablet (325  mg total) by mouth two (2) times a day as needed for pain., Disp: , Rfl:   ???  alendronate (FOSAMAX) 70 MG tablet, Take 1 tablet (70 mg total) by mouth every seven (7) days., Disp: 12 tablet, Rfl: 3  ???  amitriptyline (ELAVIL) 25 MG tablet, Take 1 tablet (25 mg total) by mouth nightly., Disp: 90 tablet, Rfl: 3  ???  arm brace Misc, Boxer splint, metacarpal wrist brace, Disp: 1 each, Rfl: 0  ???  aspirin 81 MG chewable tablet, CHEW 1 TABLET BY MOUTH DAILY, Disp: 36 tablet, Rfl: 3  ???  buPROPion (WELLBUTRIN XL) 150 MG 24 hr tablet, Take 1 tablet (150 mg total) by mouth every morning., Disp: 90 tablet, Rfl: 2  ???  dicyclomine (BENTYL) 20 mg tablet, Take 1 tablet (20 mg total) by mouth four times a day as needed (stomach cramps)., Disp: 360 tablet, Rfl: 3  ???  empty container Misc, Use as directed, Disp: 1 each, Rfl: 2  ???  estradioL (ESTRACE) 0.01 % (0.1 mg/gram) vaginal cream, Insert 2 g into the vagina Two (2) times a week. Insert 2 g inside vagina and apply a pea-sized amount around urethra twice weekly, Disp: , Rfl: 0  ???  ferrous sulfate 325 (65 FE) MG EC tablet, Take 1 tablet (325 mg total) by mouth every other day., Disp: 45 tablet, Rfl: 3  ???  gabapentin (NEURONTIN) 300 MG capsule, 3 capsules po morning and midday, 4 capsules po qhs, Disp: 360 capsule, Rfl: 3  ???  haloperidoL (HALDOL) 0.5 MG tablet, Take 1 tablet (0.5 mg total) by mouth daily before breakfast., Disp: 90 tablet, Rfl: 3  ???  hydrOXYzine (ATARAX) 10 MG tablet, TAKE 1 TABLET (10 MG TOTAL) BY MOUTH 3 TIMES A DAY AS NEEDED FOR ITCHING, Disp: 270 tablet, Rfl: 1  ???  leg brace (KNEE SUPPORT BRACE) Misc, 1 each by Miscellaneous route daily as needed., Disp: 1 each, Rfl: 0  ???  methocarbamoL (ROBAXIN) 500 MG tablet, Take 1 tablet (500 mg total) by mouth Three times a day., Disp: 90 tablet, Rfl: 1  ???  needle, disp, 25 gauge 25 gauge x 5/8 Ndle, For methotrexate injections, Disp: 100 each, Rfl: 0  ???  nicotine (NICODERM CQ) 14 mg/24 hr patch, Place 1 patch on the skin daily., Disp: 28 patch, Rfl: 1  ???  nicotine (NICODERM CQ) 14 mg/24 hr patch, Place 1 patch on the skin daily., Disp: 28 patch, Rfl: 0  ???  syringe with needle (MONOJECT TB SAFETY SYRINGE) 1 mL 25 gauge x 5/8 Syrg, Use as directed with methotrexate injections., Disp: 100 each, Rfl: 0  ???  VITAMIN B-1 100 mg tablet, Take 1 tablet (100 mg total) by mouth daily., Disp: 100 tablet, Rfl: 3  ???  folic acid (FOLVITE) 1 MG tablet, Take 1 tablet (1 mg total) by mouth daily., Disp: 90 tablet, Rfl: 3  ???  methotrexate sodium (METHOTREXATE, CONTAINS PRESERVATIVES,) 25 mg/mL injection solution, Inject 1 mL (25 mg total) under the skin once a week., Disp: 12 mL, Rfl: 1  ???  predniSONE (DELTASONE) 10 MG tablet, Take 4 tablets (40 mg total) by mouth daily for 5 days, THEN 3 tablets (30 mg total) daily for 5 days, THEN 2 tablets (20 mg total) daily for 5 days, THEN 1.5 tablets (15 mg total) daily for 5 days, THEN 1 tablet (10 mg total) daily for 14 days, THEN 0.5 tablets (5 mg total) daily for 14 days. (Patient not taking: Reported on 02/28/2022),  Disp: 74 tablet, Rfl: 0    Medical History:  Past Medical History:   Diagnosis Date   ??? Arthritis     RA   ??? Cataract    ??? Functional dyspepsia    ??? GERD (gastroesophageal reflux disease)    ??? Gout    ??? RA (rheumatoid arthritis) (CMS-HCC)    ??? Smoking    ??? Stage 1 mild COPD by GOLD classification (CMS-HCC) 02/25/2017 Surgical History:  Past Surgical History:   Procedure Laterality Date   ??? ovarian cyst removed     ??? OVARY SURGERY     ??? PR COLONOSCOPY FLX DX W/COLLJ SPEC WHEN PFRMD N/A 03/05/2013    Procedure: COLONOSCOPY, FLEXIBLE, PROXIMAL TO SPLENIC FLEXURE; DIAGNOSTIC, W/WO COLLECTION SPECIMEN BY BRUSH OR WASH;  Surgeon: Trudie Buckler, MD;  Location: GI PROCEDURES [facility];  Service: Gastroenterology   ??? PR COMPRE EP EVAL ABLTJ 3D MAPG TX SVT N/A 12/26/2015    Procedure: A-Flutter Ablation;  Surgeon: Theresia Majors, MD;  Location: [facility] EP;  Service: Cardiology   ??? PR UP GI ENDOSCOPY,BALL DIL,30MM N/A 06/03/2019    Procedure: UGI ENDO; W/BALLOON DILAT ESOPHAGUS (<30MM DIAM);  Surgeon: Neysa Hotter, MD;  Location: [facility];  Service: Gastroenterology   ??? PR UPPER GI ENDOSCOPY,BIOPSY N/A 09/16/2012    Procedure: UGI ENDOSCOPY; WITH BIOPSY, SINGLE OR MULTIPLE;  Surgeon: Brown Human, MD;  [facility];  Service: Gastroenterology   ??? PR UPPER GI ENDOSCOPY,BIOPSY N/A 06/03/2019    Procedure: UGI ENDOSCOPY; WITH BIOPSY, SINGLE OR MULTIPLE;  Surgeon: Neysa Hotter, MD;  Location: [facility] PROC[facility];  Service: Gastroenterology   ??? PR UPPER GI ENDOSCOPY,DIAGNOSIS N/A 06/03/2019    Procedure: UGI ENDO, INCLUDE ESOPHAGUS, STOMACH, & DUODENUM &/OR JEJUNUM; DX W/WO COLLECTION SPECIMN, BY BRUSH OR WASH;  Surgeon: Neysa Hotter, MD;  Location: [facility] PROCEDURES [facility];  Service: Gastroenterology       Social History:  Social History     Tobacco Use   ??? Smoking status: Every Day     Packs/day: 1.00     Years: 35.00     Additional pack years: 0.00     Total pack years: 35.00     Types: Cigarettes   ??? Smokeless tobacco: Never   ??? Tobacco comments:     Started smoking at 33,    Vaping Use   ??? Vaping Use: Some days   Substance Use Topics   ??? Alcohol use: No     Alcohol/week: 0.0 standard drinks of alcohol   ??? Drug use: Yes     Types: Marijuana Comment: once a month       Family History:  Family History   Problem Relation Age of Onset   ??? Diabetes Mother         type 2   ??? Breast cancer Mother    ??? Diabetes Father         type 2   ??? Cataracts Father    ??? Macular degeneration Neg Hx    ??? Retinal detachment Neg Hx    ??? Colon cancer Neg Hx    ??? Endometrial cancer Neg Hx    ??? Ovarian cancer Neg Hx    ??? Glaucoma Neg Hx    ??? Blindness Neg Hx            Objective   Vitals:    02/28/22 1036   BP: 97/64  Pulse: 84   Temp: 36.2 ??C (97.1 ??F)   TempSrc: Temporal   Weight: 58.4 kg (128 lb 12.8 oz)   Height: 168.9 cm (5' 6.5)     Physical Exam:  General: Well-appearing.  HEENT: Sclera anicteric;   Pulmonary: Unremarkable respiratory effort.   Neuro: Moving all four extremities spontaneously, able to walk to the exam table independently   Psych: Euthymic affect.  MSK:     ??? Hands: Swelling with bogginess/warmth of L wrist and 2-3 MCP on L>R (similar to last visit). No other swelling or tenderness to palpation of the MCPs, PIPs, or DIPs.  ??? Wrists: Left wrist swelling and pain as noted above. R wrist with some fullness but no bogginess.  ??? Elbows: No swelling or tenderness to palpation of the elbows.  ??? Shoulders: No swelling or tenderness to palpation of the bilateral shoulders.  ??? Knees: No swelling or tenderness to palpation of the bilateral knees.  ??? Ankles: No swelling or tenderness to palpation of the bilateral ankles.  ??? Feet: Tenderness to palpation of the lateral hips.   Skin: No evidence of erythema or rash.  Extremities: Warm, no LE edema.  Swollen Joint Count (0-28): 5  Tender Joint Count (0-28): 5  Patient Global Assessment of Disease Activity (0-10): 9  Evaluator Global Assessment of Disease (0-10): 5  CDAI Score: 24  CDAI interpretation:  0.0-2.8 Remission   2.9-10.0 Low disease activity   10.1-22.0 Moderate disease activity   22.1-76.0 High disease activity

## 2022-03-02 MED ORDER — METHOTREXATE (PF) 25 MG/0.5 ML SUBCUTANEOUS AUTO-INJECTOR
SUBCUTANEOUS | 5 refills | 28 days | Status: CP
Start: 2022-03-02 — End: ?
  Filled 2022-03-13: qty 2, 28d supply, fill #0

## 2022-03-04 DIAGNOSIS — M069 Rheumatoid arthritis, unspecified: Principal | ICD-10-CM

## 2022-03-04 NOTE — Unmapped (Signed)
Bethany Meyer   Patient Onboarding/Medication Counseling    Bethany Meyer is a 66 y.o. female with RA who I am counseling today on initiation of therapy.  I am speaking to the patient.    Was a Nurse, learning disability used for this call? No    Verified patient's date of birth / HIPAA.    Specialty medication(s) to be sent: Inflammatory Disorders: Rasuvo      Non-specialty medications/supplies to be sent: n/a      Medications not needed at this time: n/a         Rasuvo (methotrexate injection)    Switch from vials to injections.       The patient declined counseling on missed dose instructions, goals of therapy, side effects and monitoring parameters, warnings and precautions, drug/food interactions, and storage, handling precautions, and disposal because they have taken the medication previously. The information in the declined sections below are for informational purposes only and was not discussed with patient.   Has used Otrexup in the past    Medication & Administration     Dosage: Inject the contents of one auto-injector (25mg ) under the skin every 7 days    Administration:     Environmental health practitioner all supplies needed for injection on a clean, flat working surface: medication pen, alcohol swabs, sharps container, etc.  Look at the medication label - look for correct medication, correct dose, and check the expiration date  Look at the medication - the liquid in the viewing window of the pen device should appear clear and yellow in color  Select injection site - you can use the front of your thigh or your belly (but not the area 2 inches around your belly button)  Prepare injection site - wash your hands and clean the skin at the injection site with an alcohol swab and let it air dry, do not touch the injection site again before the injection  Remove the yellow safety cap by pulling it straight off, do not attempt to twist it; the pen is now ready to use  Pinch up a pad of skin surrounding the cleaned injection site with your thumb and forefinger of your hand not holding the auto-injector pen  Position the uncapped transparent end of the auto-injector against your cleaned injection site at a 90 degree angle; push the pen firmly onto your skin until you feel the stop point in order to unlock the yellow injection button  While holding the pen firmly in place, depress the yellow injection button with your thumb to initiate the injection process; you will hear a click when the process begins; continue to hold the pen firmly in place for at least 5 seconds; visualize the transparent control zone of the pen to make sure the entire dose is injection  When the movement stopped the injection is completed and you can pull the pen away from your injection site  Dispose of the used auto-injector immediately in your sharps disposal container  If you see any blood at the injection site, press a cotton ball or gauze on the site and maintain pressure until the bleeding stops, do not rub the injection site      Adherence/Missed dose instructions:  If your injection is given more than 2 days after your scheduled injection date - consult your pharmacist for additional instructions on how to adjust your dosing schedule.    Goals of Therapy       Rheumatoid arthritis  Achieve symptom remission  Slow disease progression  Protection of remaining articular structures  Maintenance of function  Maintenance of effective psychosocial functioning    Side Effects & Monitoring Parameters     Upset stomach, diarrhea, nausea or throwing up  Feeling dizzy, tired, or weak  Hair thinning (reversible)  Minor cold-like symptoms  Photosensitivity - use sunscreen and avoid prolonged exposure    The following side effects should be reported to the provider:  Signs of a hypersensitivity reaction - rash; hives; itching; red, swollen, blistered, or peeling skin; wheezing; tightness in the chest or throat; difficulty breathing, swallowing, or talking; swelling of the mouth, face, lips, tongue, or throat; etc.  Reduced immune function - report signs of infection such as fever; chills; body aches; very bad sore throat; ear or sinus pain; cough; more sputum or change in color of sputum; pain with passing urine; wound that will not heal, etc.  Also at a slightly higher risk of some malignancies (mainly skin and blood cancers) due to this reduced immune function.  In the case of signs of infection - the patient should hold the next dose of Rasuvo?? and call your primary care provider to ensure adequate medical care.  Treatment may be resumed when infection is treated and patient is asymptomatic.  Signs of bleeding - throwing up or coughing up blood; blood in the urine; black, red, or tarry stool; abnormal vaginal bleeding; bruises without a cause or that get bigger  Signs of pancreatitis - sudden very bad stomach pain, unexplained vomiting  Signs of kidney dysfunction - unable to pass urine; blood in the urine; sudden weight gain  Signs of liver dysfunction - darkened urine; upset stomach; light-colored stool; yellow skin or eyes  Signs of nerve problems - burning; numbness; tingling  Pinpoint red spots on the skin  Changes in eyesight      Contraindications, Warnings, & Precautions     Have your bloodwork checked as you have been told by your prescriber  Talk with your doctor if you are pregnant, planning to become pregnant, or breastfeeding - women taking this medication must use birth control while taking this drug and for some time after the last dose  Discuss the possible need for holding your dose(s) of methotrexate when a planned procedure is scheduled with the prescriber as it may delay healing/recovery timeline       Drug/Food Interactions     Medication list reviewed in Epic. The patient was instructed to inform the care team before taking any new medications or supplements. No drug interactions identified.     Storage, Handling Precautions, & Disposal Store intact pens at room temperature  Protect from light  Dispose of used pens in a sharps disposal container          Current Medications (including OTC/herbals), Comorbidities and Allergies     Current Outpatient Medications   Medication Sig Dispense Refill    acetaminophen (TYLENOL) 325 MG tablet Take 1 tablet (325 mg total) by mouth two (2) times a day as needed for pain.      alendronate (FOSAMAX) 70 MG tablet Take 1 tablet (70 mg total) by mouth every seven (7) days. 12 tablet 3    amitriptyline (ELAVIL) 25 MG tablet Take 1 tablet (25 mg total) by mouth nightly. 90 tablet 3    arm brace Misc Boxer splint, metacarpal wrist brace 1 each 0    aspirin 81 MG chewable tablet CHEW 1 TABLET BY MOUTH DAILY 36 tablet 3    buPROPion (  WELLBUTRIN XL) 150 MG 24 hr tablet Take 1 tablet (150 mg total) by mouth every morning. 90 tablet 2    dicyclomine (BENTYL) 20 mg tablet Take 1 tablet (20 mg total) by mouth four times a day as needed (stomach cramps). 360 tablet 3    empty container Misc Use as directed 1 each 2    estradioL (ESTRACE) 0.01 % (0.1 mg/gram) vaginal cream Insert 2 g into the vagina Two (2) times a week. Insert 2 g inside vagina and apply a pea-sized amount around urethra twice weekly  0    ferrous sulfate 325 (65 FE) MG EC tablet Take 1 tablet (325 mg total) by mouth every other day. 45 tablet 3    folic acid (FOLVITE) 1 MG tablet Take 1 tablet (1 mg total) by mouth daily. 90 tablet 3    gabapentin (NEURONTIN) 300 MG capsule 3 capsules po morning and midday, 4 capsules po qhs 360 capsule 3    haloperidoL (HALDOL) 0.5 MG tablet Take 1 tablet (0.5 mg total) by mouth daily before breakfast. 90 tablet 3    hydrOXYzine (ATARAX) 10 MG tablet TAKE 1 TABLET (10 MG TOTAL) BY MOUTH 3 TIMES A DAY AS NEEDED FOR ITCHING 270 tablet 1    leg brace (KNEE SUPPORT BRACE) Misc 1 each by Miscellaneous route daily as needed. 1 each 0    methocarbamoL (ROBAXIN) 500 MG tablet Take 1 tablet (500 mg total) by mouth Three times a day. 90 tablet 1    methotrexate sodium (METHOTREXATE, CONTAINS PRESERVATIVES,) 25 mg/mL injection solution Inject 1 mL (25 mg total) under the skin once a week. 12 mL 1    methotrexate, PF, 25 mg/0.5 mL [name] Inject the contents of 1 auto-injector (25 mg total) under the skin every seven (7) days. 2 mL 5    needle, disp, 25 gauge 25 gauge x 5/8 Ndle For methotrexate injections 100 each 0    nicotine (NICODERM CQ) 14 mg/24 hr patch Place 1 patch on the skin daily. 28 patch 1    nicotine (NICODERM CQ) 14 mg/24 hr patch Place 1 patch on the skin daily. 28 patch 0    predniSONE (DELTASONE) 10 MG tablet Take 4 tablets (40 mg total) by mouth daily for 5 days, THEN 3 tablets (30 mg total) daily for 5 days, THEN 2 tablets (20 mg total) daily for 5 days, THEN 1.5 tablets (15 mg total) daily for 5 days, THEN 1 tablet (10 mg total) daily for 14 days, THEN 0.5 tablets (5 mg total) daily for 14 days. (Patient not taking: Reported on 02/28/2022) 74 tablet 0    syringe with needle (MONOJECT TB SAFETY SYRINGE) 1 mL 25 gauge x 5/8 Syrg Use as directed with methotrexate injections. 100 each 0    VITAMIN B-1 100 mg tablet Take 1 tablet (100 mg total) by mouth daily. 100 tablet 3     No current facility-administered medications for this visit.       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    Appropriateness of Therapy     Acute infections noted within Epic:  No active infections  Patient reported infection: None    Is medication and dose appropriate based on diagnosis and infection status? Yes    Prescription has been clinically reviewed: Yes      Baseline Quality of Life Assessment      How many days over the past month did your RA  keep you from your normal activities? For example, brushing your teeth or getting up in the morning. Has swollen ankle will have checked for carpel tunnel    Financial Information     Medication Assistance provided: None Required transition fill, PA submitted    Anticipated copay of $10.35/28 days reviewed with patient. Verified delivery address.    Delivery Information     Scheduled delivery date: 10/25    Expected start date: 10/25    Medication will be delivered via UPS to the prescription address in [facility].  This shipment will not require a signature.      Explained the services we provide at [facility] and that each month we would call to set up refills.  Stressed importance of returning phone calls so that we could ensure they receive their medications in time each month.  Informed patient that we should be setting up refills 7-10 days prior to when they will run out of medication.  A pharmacist will reach out to perform a clinical assessment periodically.  Informed patient that a welcome packet, containing information about our [facility] and other support services, a Notice of Privacy Practices, and a drug information handout will be sent.      The patient or caregiver noted above participated in the development of this care plan and knows that they can request review of or adjustments to the care plan at any time.      Patient or caregiver verbalized understanding of the above information as well as how to contact the [facility] at [phone] option 4 with any questions/concerns.  The [facility] is open Monday through Friday 8:30am-4:30pm.  A pharmacist is available 24/7 via pager to answer any clinical questions they may have.    Patient Specific Needs     Does the patient have any physical, cognitive, or cultural barriers? No    Does the patient have adequate living arrangements? (i.e. the ability to store and take their medication appropriately) Yes    Did you identify any home environmental safety or security hazards? No    Patient prefers to have medications discussed with  Patient     Is the patient or caregiver able to read and understand education materials at a high school level or above? Yes    Patient's primary language is  English     Is the patient high risk? No    SOCIAL DETERMINANTS OF HEALTH     At the [facility], we have learned that life circumstances - like trouble affording food, housing, utilities, or transportation can affect the health of many of our patients.   That is why we wanted to ask:  are you currently experiencing any life circumstances that are negatively impacting your health and/or quality of life? No    Social Determinants of Health     Financial Resource Strain: Low Risk  (12/13/2020)    Overall Financial Resource Strain (CARDIA)     Difficulty of Paying Living Expenses: Not very hard   Internet Connectivity: Not on file   Food Insecurity: No Food Insecurity (12/13/2020)    Hunger Vital Sign     Worried About Running Out of Food in the Last Year: Never true     Ran Out of Food in the Last Year: Never true   Tobacco Use: High Risk (02/28/2022)    Patient History     Smoking Tobacco Use: Every Day     Smokeless Tobacco Use: Never     Passive Exposure: Not on file   Housing/Utilities: Unknown (12/16/2021)    Housing/Utilities     Within the past 12 months, have you ever stayed: outside, in a car, in a tent, in an overnight shelter, or temporarily in someone else's home (i.e. couch-surfing)?: Patient refused     Are you worried about losing your housing?: Not on file     Within the past 12 months, have you been unable to get utilities (heat, electricity) when it was really needed?: Not on file   Alcohol Use: Not At Risk (07/11/2020)    Alcohol Use     How often do you have a drink containing alcohol?: Never     How many drinks containing alcohol do you have on a typical day when you are drinking?: 1 - 2     How often do you have 5 or more drinks on one occasion?: Never   Transportation Needs: No Transportation Needs (12/13/2020)    PRAPARE - Transportation     Lack of Transportation (Medical): No     Lack of Transportation (Non-Medical): No   Substance Use: Low Risk  (07/11/2020)    Substance Use     Taken prescription drugs for non-medical reasons: Never     Taken illegal drugs: Never     Patient indicated they have taken drugs in the past year for non-medical reasons: Yes, [positive answer(s)]: Not on file   Health Literacy: Not on file (08/24/2020)   Physical Activity: Unknown (02/02/2019)    Exercise Vital Sign     Days of Exercise per Week: Patient refused     Minutes of Exercise per Session: Patient refused   Interpersonal Safety: Not on file   Stress: Unknown (02/02/2019)    [employer] of Occupational Health - Occupational Stress Questionnaire     Feeling of Stress : Patient refused   Intimate Partner Violence: Not At Risk (07/11/2020)    Humiliation, Afraid, Rape, and Kick questionnaire     Fear of Current or Ex-Partner: No     Emotionally Abused: No     Physically Abused: No     Sexually Abused: No   Depression: At risk (08/22/2020)    PHQ-2     PHQ-2 Score: 5   Social Connections: Unknown (02/02/2019)    Social Connection and Isolation Panel [NHANES]     Frequency of Communication with Friends and Family: Patient refused     Frequency of Social Gatherings with Friends and Family: Patient refused     Attends Religious Services: Patient refused     Active Member of Clubs or Organizations: Patient refused     Attends Banker Meetings: Patient refused Marital Status:  Patient refused       Would you be willing to receive help with any of the needs that you have identified today? Not applicable       Julianne Rice, PharmD  [facility] Pharmacist

## 2022-03-04 NOTE — Unmapped (Signed)
I agree with the resident and have made myself available to the resident to discuss the patient's findings, assessment, and plan.    Arsenio Loader, MD  Glaucoma Fellow, Instructor in Ophthalmology  03/03/22

## 2022-03-04 NOTE — Unmapped (Signed)
[facility] Specialty Medication Onboarding    Specialty Medication: RASUVO (PF) 25 mg/0.5 mL [name] (methotrexate (PF))  Prior Authorization: Required  Financial Assistance: No - copay  <$25  Final Copay/Day Supply: $10.35 / 28    Insurance Restrictions: None     Notes to Pharmacist: Insurance will cover under a transition fill. PA referral has been submitted.    The triage team has completed the benefits investigation and has determined that the patient is able to fill this medication at [facility]. Please contact the patient to complete the onboarding or follow up with the prescribing physician as needed.

## 2022-03-06 NOTE — Unmapped (Signed)
Addended by: Berenice Primas on: 03/06/2022 11:36 AM     Modules accepted: Orders

## 2022-03-11 ENCOUNTER — Ambulatory Visit: Admit: 2022-03-11 | Discharge: 2022-03-12 | Payer: MEDICARE

## 2022-03-15 ENCOUNTER — Ambulatory Visit: Admit: 2022-03-15 | Discharge: 2022-03-16 | Payer: MEDICARE

## 2022-03-15 DIAGNOSIS — H25813 Combined forms of age-related cataract, bilateral: Principal | ICD-10-CM

## 2022-03-15 NOTE — Unmapped (Signed)
66 y.o. F with PMH of rheumatoid arthritis here for repeat IOL calcs     IOL master matches TopoKs well.     Plan:   CEIOL OD 05/29/2022     Surgical Plan Right Eye: 05/29/2022 Barton Fanny)  Indication: Cataract is primary cause of the symptomatic impairment of visual function not correctable with glasses or contact lenses. Therefore, to improve vision for patient's documented visual complaints recommend phaco and pcIOL of the right eye. There is a reasonable expectation that surgery will significantly improve the visual and functional status of the patient.  CPT Code: 16109  Anesthesia: MAC + Topical+Pre-Op Block  Length of surgery: 45 mins   Resident Selected IOL: Primary Lens [id number] +15.00D, Secondary Lens [id number] +14.50D  Special Needs: Epi-shugarcaine, Trypan Blue     Target refraction: [employer]: After much discussion, the patient felt would like to use both eyes for distance so we will aim for plano target refraction.    Surgical Counseling:   - The risks, benefits, and alternatives of surgery were discussed. Risks includes but are not limited to infection, bleeding, cataract or intraocular lens dislocation, glaucoma, retinal detachment, need for multiple surgeries, chronic inflammation, chronic pain, ptosis, diplopia, disfigurement, blindness, loss of eye, and other grave unforeseeable disability. The patient understands the risk of surgery and wishes to proceed. All questions were answered.

## 2022-03-18 NOTE — Unmapped (Signed)
I saw and evaluated the patient, participating in the key portions of the service.  I reviewed the resident???s note.  I agree with the resident???s findings and plan including interpretation of images.     Jeri Lager MD

## 2022-03-31 DIAGNOSIS — M0579 Rheumatoid arthritis with rheumatoid factor of multiple sites without organ or systems involvement: Principal | ICD-10-CM

## 2022-04-02 ENCOUNTER — Ambulatory Visit: Admit: 2022-04-02 | Discharge: 2022-04-03 | Payer: MEDICARE

## 2022-04-02 NOTE — Unmapped (Signed)
Precious Reel has ordered an EMG test for you.  That department typically will call you to schedule.  If you don't receive a call from the EMG department within 48 hours please give them a call at [phone].  It will take 7-14 days for EMG results to return. We will contact you to arrange a phone visit or follow up appointment after we receive EMG results.     Thank you for choosing [facility]!  We appreciate the opportunity to participate in your care.      If any questions or concerns arise after your visit, please do not hesitant to contact me by [facility] or by calling the hand team at [phone].      Voicemail messages: Messages are checked between 8:00 am- 4:00 pm Monday- Friday.    MyChart messages: These messages are checked by the nurses during normal business hours 8:30 am-4:30 pm Monday-Friday every 24-48 hours and are for non-urgent, non-emergent concerns. You may be asked to return for a follow up visit if it is deemed your questions are best handled in the clinic setting.    Please let me know if I can be of assistance with this or other orthopaedic issues in the future.

## 2022-04-02 NOTE — Unmapped (Signed)
[facility]  [employer]. Ida Rogue, PA-C    ASSESSMENT:  Left hand numbness  PLAN:  I discussed with patient there is continued concern for left carpal tunnel syndrome and treatment options in detail including bracing, injections, and surgical options.  Patient would like to proceed with bilateral upper extremity EMG which we are in agreement with.  She will follow-up after EMG to discuss results and potential injections or surgical options.  She is in agreement with this plan.  We will proceed with the following treatment plan:  Medications: OTC Tylenol  DME/Cast: Cock-up wrist brace  PT/OT: none  Injections: none  5.   Follow-up: No follow-ups on file.   6.   X-rays at next visit: none    SUBJECTIVE:  Chief Complaint:  left hand numbness  History of Present Illness:   Bethany Meyer is a 66 y.o. right hand dominant female  who presents for evaluation of left hand numbness that has been ongoing for many years.  Patient reports pain in the entirety of the hand as well as numbness that will wake her up at nighttime.  She was last seen in June 2023 where there was concern for left carpal tunnel and cubital tunnel syndrome and an EMG was ordered which was not obtained.  Patient has been wearing brace on the elbow and wrist at nighttime with limited improvement of symptoms.  She notes occasional radiating pain into the forearm that comes and goes.  She has past medical history of rheumatoid arthritis being treated at [facility].  She denies any current numbness and tingling the right upper extremity  Employment: unemployed    Medical History Past Medical History:   Diagnosis Date    Arthritis     RA    Cataract     Functional dyspepsia     GERD (gastroesophageal reflux disease)     Gout     RA (rheumatoid arthritis) (CMS-HCC)     Smoking     Stage 1 mild COPD by GOLD classification (CMS-HCC) 02/25/2017      Surgical History Past Surgical History:   Procedure Laterality Date    ovarian cyst removed      OVARY SURGERY      PR COLONOSCOPY FLX DX W/COLLJ SPEC WHEN PFRMD N/A 03/05/2013    Procedure: COLONOSCOPY, FLEXIBLE, PROXIMAL TO SPLENIC FLEXURE; DIAGNOSTIC, W/WO COLLECTION SPECIMEN BY BRUSH OR WASH;  Surgeon: Trudie Buckler, MD;  Location: GI PROCEDURES [facility] [facility];  Service: Gastroenterology    PR COMPRE EP EVAL ABLTJ 3D MAPG TX SVT N/A 12/26/2015    Procedure: A-Flutter Ablation;  Surgeon: Theresia Majors, MD;  Location: [facility];  Service: Cardiology    PR UP GI ENDOSCOPY,BALL DIL,30MM N/A 06/03/2019    Procedure: UGI ENDO; W/BALLOON DILAT ESOPHAGUS (<30MM DIAM);  Surgeon: Neysa Hotter, MD;  Location: [facility];  Service: Gastroenterology    PR UPPER GI ENDOSCOPY,BIOPSY N/A 09/16/2012    Procedure: UGI ENDOSCOPY; WITH BIOPSY, SINGLE OR MULTIPLE;  Surgeon: Brown Human, MD;  [facility];  Service: Gastroenterology    PR UPPER GI ENDOSCOPY,BIOPSY N/A 06/03/2019    Procedure: UGI ENDOSCOPY; WITH BIOPSY, SINGLE OR MULTIPLE;  Surgeon: Neysa Hotter, MD;  Location: [facility] PROCEDURES [facility];  Service: Gastroenterology    PR UPPER GI ENDOSCOPY,DIAGNOSIS N/A 06/03/2019    Procedure: UGI ENDO, INCLUDE ESOPHAGUS, STOMACH, & DUODENUM &/OR JEJUNUM; DX W/WO COLLECTION SPECIMN, BY BRUSH OR WASH;  Surgeon: Fortino Sic  Andres Shad, MD;  Location: [facility];  Service: Gastroenterology      Medications   Current Outpatient Medications:     acetaminophen (TYLENOL) 325 MG tablet, Take 1 tablet (325 mg total) by mouth two (2) times a day as needed for pain., Disp: , Rfl:     alendronate (FOSAMAX) 70 MG tablet, Take 1 tablet (70 mg total) by mouth every seven (7) days., Disp: 12 tablet, Rfl: 3    amitriptyline (ELAVIL) 25 MG tablet, Take 1 tablet (25 mg total) by mouth nightly., Disp: 90 tablet, Rfl: 3    arm brace Misc, Boxer splint, metacarpal wrist brace, Disp: 1 each, Rfl: 0    aspirin 81 MG chewable tablet, CHEW 1 TABLET BY MOUTH DAILY, Disp: 36 tablet, Rfl: 3    buPROPion (WELLBUTRIN XL) 150 MG 24 hr tablet, Take 1 tablet (150 mg total) by mouth every morning., Disp: 90 tablet, Rfl: 2    dicyclomine (BENTYL) 20 mg tablet, Take 1 tablet (20 mg total) by mouth four times a day as needed (stomach cramps)., Disp: 360 tablet, Rfl: 3    empty container Misc, Use as directed, Disp: 1 each, Rfl: 2    estradioL (ESTRACE) 0.01 % (0.1 mg/gram) vaginal cream, Insert 2 g into the vagina Two (2) times a week. Insert 2 g inside vagina and apply a pea-sized amount around urethra twice weekly, Disp: , Rfl: 0    ferrous sulfate 325 (65 FE) MG EC tablet, Take 1 tablet (325 mg total) by mouth every other day., Disp: 45 tablet, Rfl: 3    folic acid (FOLVITE) 1 MG tablet, Take 1 tablet (1 mg total) by mouth daily., Disp: 90 tablet, Rfl: 3    gabapentin (NEURONTIN) 300 MG capsule, 3 capsules po morning and midday, 4 capsules po qhs, Disp: 360 capsule, Rfl: 3    haloperidoL (HALDOL) 0.5 MG tablet, Take 1 tablet (0.5 mg total) by mouth daily before breakfast., Disp: 90 tablet, Rfl: 3    hydrOXYzine (ATARAX) 10 MG tablet, TAKE 1 TABLET (10 MG TOTAL) BY MOUTH 3 TIMES A DAY AS NEEDED FOR ITCHING, Disp: 270 tablet, Rfl: 1    leg brace (KNEE SUPPORT BRACE) Misc, 1 each by Miscellaneous route daily as needed., Disp: 1 each, Rfl: 0    methocarbamoL (ROBAXIN) 500 MG tablet, Take 1 tablet (500 mg total) by mouth Three times a day., Disp: 90 tablet, Rfl: 1    methotrexate, PF, 25 mg/0.5 mL [name], Inject the contents of 1 auto-injector (25 mg total) under the skin every seven (7) days., Disp: 2 mL, Rfl: 5    needle, disp, 25 gauge 25 gauge x 5/8 Ndle, For methotrexate injections, Disp: 100 each, Rfl: 0    nicotine (NICODERM CQ) 14 mg/24 hr patch, Place 1 patch on the skin daily., Disp: 28 patch, Rfl: 1    nicotine (NICODERM CQ) 14 mg/24 hr patch, Place 1 patch on the skin daily., Disp: 28 patch, Rfl: 0    VITAMIN B-1 100 mg tablet, Take 1 tablet (100 mg total) by mouth daily., Disp: 100 tablet, Rfl: 3   Allergies Patient has no known allergies.     Social History Social History     Tobacco Use    Smoking status: Every Day     Packs/day: 1.00     Years: 35.00     Additional pack years: 0.00     Total pack years: 35.00     Types: Cigarettes    Smokeless  tobacco: Never    Tobacco comments:     Started smoking at 33,    Vaping Use    Vaping Use: Some days   Substance Use Topics    Alcohol use: No     Alcohol/week: 0.0 standard drinks of alcohol    Drug use: Yes     Types: Marijuana     Comment: once a month        Family History family history includes Breast cancer in her mother; Cataracts in her father; Diabetes in her father and mother.     Review of Systems 12 point review of systems were obtained in clinic and all pertinent postives/negatives were documented in the HPI.       OBJECTIVE:  Physical Exam:  General Appearance well-nourished and no acute distress   Mood and Affect alert, cooperative, and pleasant   Gait and Station neutral standing alignment   Cardiovascular well-perfused distally and no swelling   Sensation Sensation intact to light touch distally  in the bilateral median, ulnar, and radial nerve distribution    MUSCULOSKELETAL    Right upper extremity Inspection: Skin clean, dry, and intact, no thenar or hypothenar atrophy  Tenderness: Nontender about the wrist/hand diffusely  ROM: Able to make a full fist and bring fingers to full extension and ROM of the wrist full with flexion, extension, supination, and pronation  Stability Exam: No evidence of instability of the wrist  Strength: 5/5 grip strength and 5/5 abduction strength of the thumb   Special Test: negative tinels, negative durkans, and negative tinels at the elbow   Left upper extremity Inspection: Skin clean, dry, and intact and no evidence of thenar or hypothenar atrophy  Tenderness: Nontender about the wrist/hand diffusely  ROM: Able to make a full fist and bring fingers to full extension and ROM of the wrist full with flexion, extension, supination, and pronation  Stability Exam: No evidence of instability of the wrist  Strength: 5/5 grip strength and 4/5 abduction strength of the thumb   Special Test: positive tinels and negative tinels at the elbow   DME ORDER:  Dx:  ,

## 2022-04-03 NOTE — Unmapped (Signed)
[facility] Clinical Assessment & Refill Coordination Note    First one was very easy.  The 2nd got stuck and the knob wouldn't come down.  She skipped that week. But did review with her to call [facility] to request replacement pen.       Bethany Meyer, DOB: 1956-05-07  Phone: [phone] (home)     All above HIPAA information was verified with patient.     Was a Nurse, learning disability used for this call? No    Specialty Medication(s):   Inflammatory Disorders: [facility]     Current Outpatient Medications   Medication Sig Dispense Refill    acetaminophen (TYLENOL) 325 MG tablet Take 1 tablet (325 mg total) by mouth two (2) times a day as needed for pain.      alendronate (FOSAMAX) 70 MG tablet Take 1 tablet (70 mg total) by mouth every seven (7) days. 12 tablet 3    amitriptyline (ELAVIL) 25 MG tablet Take 1 tablet (25 mg total) by mouth nightly. 90 tablet 3    arm brace Misc Boxer splint, metacarpal wrist brace 1 each 0    aspirin 81 MG chewable tablet CHEW 1 TABLET BY MOUTH DAILY 36 tablet 3    buPROPion (WELLBUTRIN XL) 150 MG 24 hr tablet Take 1 tablet (150 mg total) by mouth every morning. 90 tablet 2    dicyclomine (BENTYL) 20 mg tablet Take 1 tablet (20 mg total) by mouth four times a day as needed (stomach cramps). 360 tablet 3    empty container Misc Use as directed 1 each 2    estradioL (ESTRACE) 0.01 % (0.1 mg/gram) vaginal cream Insert 2 g into the vagina Two (2) times a week. Insert 2 g inside vagina and apply a pea-sized amount around urethra twice weekly  0    ferrous sulfate 325 (65 FE) MG EC tablet Take 1 tablet (325 mg total) by mouth every other day. 45 tablet 3    folic acid (FOLVITE) 1 MG tablet Take 1 tablet (1 mg total) by mouth daily. 90 tablet 3    gabapentin (NEURONTIN) 300 MG capsule 3 capsules po morning and midday, 4 capsules po qhs 360 capsule 3    haloperidoL (HALDOL) 0.5 MG tablet Take 1 tablet (0.5 mg total) by mouth daily before breakfast. 90 tablet 3 hydrOXYzine (ATARAX) 10 MG tablet TAKE 1 TABLET (10 MG TOTAL) BY MOUTH 3 TIMES A DAY AS NEEDED FOR ITCHING 270 tablet 1    leg brace (KNEE SUPPORT BRACE) Misc 1 each by Miscellaneous route daily as needed. 1 each 0    methocarbamoL (ROBAXIN) 500 MG tablet Take 1 tablet (500 mg total) by mouth Three times a day. 90 tablet 1    methotrexate, PF, 25 mg/0.5 mL [name] Inject the contents of 1 auto-injector (25 mg total) under the skin every seven (7) days. 2 mL 5    needle, disp, 25 gauge 25 gauge x 5/8 Ndle For methotrexate injections 100 each 0    nicotine (NICODERM CQ) 14 mg/24 hr patch Place 1 patch on the skin daily. 28 patch 1    nicotine (NICODERM CQ) 14 mg/24 hr patch Place 1 patch on the skin daily. 28 patch 0    VITAMIN B-1 100 mg tablet Take 1 tablet (100 mg total) by mouth daily. 100 tablet 3     No current facility-administered medications for this visit.        Changes to medications: [name] reports no changes at  this time.    No Known Allergies    Changes to allergies: No    SPECIALTY MEDICATION ADHERENCE       Medication Adherence    Patient reported X missed doses in the last month: 1  Specialty Medication: [facility]  Patient is on additional specialty medications: No  Informant: patient                            Specialty medication(s) dose(s) confirmed: Regimen is correct and unchanged.     Are there any concerns with adherence? No    Adherence counseling provided? Not needed    CLINICAL MANAGEMENT AND INTERVENTION      Clinical Benefit Assessment:    Do you feel the medicine is effective or helping your condition?  It helps a little but lately foot has been swollen close to 2 months where she can't even put it in the shoe.  She went to see the MD regarding numbness in hands and has an appt  with new MD to review the numbess    Clinical Benefit counseling provided? Progress note from 02/28/22 shows evidence of clinical benefit. She has infusion appointment in December     Adverse Effects Assessment:    Are you experiencing any side effects? No    Are you experiencing difficulty administering your medicine? No    Quality of Life Assessment:    Quality of Life    Rheumatology  1. What impact has your specialty medication had on the reduction of your daily pain level?: Minimal  2. What impact has your specialty medication had on your ability to complete daily tasks (prepare meals, get dressed, etc...)?Marland Kitchen Minimal  Oncology  Dermatology  Cystic Fibrosis          How many days over the past month did your RA  keep you from your normal activities? For example, brushing your teeth or getting up in the morning. Patient states 25% disability with left sided numbness    Have you discussed this with your provider? Yes    Acute Infection Status:    Acute infections noted within Epic:  No active infections  Patient reported infection: None    Therapy Appropriateness:    Is therapy appropriate and patient progressing towards therapeutic goals? Yes, therapy is appropriate and should be continued    DISEASE/MEDICATION-SPECIFIC INFORMATION      For patients on injectable medications: Patient currently has 1 doses left.  Next injection is scheduled for 11/16.    Chronic Inflammatory Diseases: Have you experienced any flares in the last month? Yes, but maybe not related to arthritis, hard to tell. She has seen MD yesterday for osteoarthritis and left sided numbness    PATIENT SPECIFIC NEEDS     Does the patient have any physical, cognitive, or cultural barriers? No    Is the patient high risk? No    Did the patient require a clinical intervention? No    Does the patient require physician intervention or other additional services (i.e., nutrition, smoking cessation, social work)? No    SOCIAL DETERMINANTS OF HEALTH     At the [facility], we have learned that life circumstances - like trouble affording food, housing, utilities, or transportation can affect the health of many of our patients.   That is why we wanted to ask: are you currently experiencing any life circumstances that are negatively impacting your health and/or quality of life? Patient declined to  answer    Social Determinants of Health     Financial Resource Strain: Low Risk  (12/13/2020)    Overall Financial Resource Strain (CARDIA)     Difficulty of Paying Living Expenses: Not very hard   Internet Connectivity: Not on file   Food Insecurity: No Food Insecurity (12/13/2020)    Hunger Vital Sign     Worried About Running Out of Food in the Last Year: Never true     Ran Out of Food in the Last Year: Never true   Tobacco Use: High Risk (04/02/2022)    Patient History     Smoking Tobacco Use: Every Day     Smokeless Tobacco Use: Never     Passive Exposure: Not on file   Housing/Utilities: Unknown (12/16/2021)    Housing/Utilities     Within the past 12 months, have you ever stayed: outside, in a car, in a tent, in an overnight shelter, or temporarily in someone else's home (i.e. couch-surfing)?: Patient refused     Are you worried about losing your housing?: Not on file     Within the past 12 months, have you been unable to get utilities (heat, electricity) when it was really needed?: Not on file   Alcohol Use: Not At Risk (07/11/2020)    Alcohol Use     How often do you have a drink containing alcohol?: Never     How many drinks containing alcohol do you have on a typical day when you are drinking?: 1 - 2     How often do you have 5 or more drinks on one occasion?: Never   Transportation Needs: No Transportation Needs (12/13/2020)    PRAPARE - Transportation     Lack of Transportation (Medical): No     Lack of Transportation (Non-Medical): No   Substance Use: Low Risk  (07/11/2020)    Substance Use     Taken prescription drugs for non-medical reasons: Never     Taken illegal drugs: Never     Patient indicated they have taken drugs in the past year for non-medical reasons: Yes, [positive answer(s)]: Not on file   Health Literacy: Not on file (08/24/2020)   Physical Activity: Unknown (02/02/2019)    Exercise Vital Sign     Days of Exercise per Week: Patient refused     Minutes of Exercise per Session: Patient refused   Interpersonal Safety: Not on file   Stress: Unknown (02/02/2019)    [employer] of Occupational Health - Occupational Stress Questionnaire     Feeling of Stress : Patient refused   Intimate Partner Violence: Not At Risk (07/11/2020)    Humiliation, Afraid, Rape, and Kick questionnaire     Fear of Current or Ex-Partner: No     Emotionally Abused: No     Physically Abused: No     Sexually Abused: No   Depression: At risk (08/22/2020)    PHQ-2     PHQ-2 Score: 5   Social Connections: Unknown (02/02/2019)    Social Connection and Isolation Panel [NHANES]     Frequency of Communication with Friends and Family: Patient refused     Frequency of Social Gatherings with Friends and Family: Patient refused     Attends Religious Services: Patient refused     Active Member of Clubs or Organizations: Patient refused     Attends Banker Meetings: Patient refused     Marital Status: Patient refused       Would you be willing to  receive help with any of the needs that you have identified today? Not applicable       SHIPPING     Specialty Medication(s) to be Shipped:   Inflammatory Disorders: [facility]    Other medication(s) to be shipped: No additional medications requested for fill at this time     Changes to insurance: No    Delivery Scheduled: Yes, Expected medication delivery date: 11/17.     Medication will be delivered via UPS to the confirmed prescription address in [facility].    The patient will receive a drug information handout for each medication shipped and additional FDA Medication Guides as required.  Verified that patient has previously received a Conservation officer, historic buildings and a Surveyor, mining.    The patient or caregiver noted above participated in the development of this care plan and knows that they can request review of or adjustments to the care plan at any time.      All of the patient's questions and concerns have been addressed.    Julianne Rice, PharmD   [facility] Specialty Pharmacist

## 2022-04-05 MED FILL — RASUVO (PF) 25 MG/0.5 ML SUBCUTANEOUS AUTO-INJECTOR: SUBCUTANEOUS | 28 days supply | Qty: 2 | Fill #1

## 2022-04-23 ENCOUNTER — Ambulatory Visit
Admit: 2022-04-23 | Discharge: 2022-04-24 | Payer: MEDICARE | Attending: Physical Medicine & Rehabilitation | Primary: Physical Medicine & Rehabilitation

## 2022-04-23 DIAGNOSIS — G589 Mononeuropathy, unspecified: Principal | ICD-10-CM

## 2022-04-23 NOTE — Unmapped (Addendum)
The EDX/NMUS study was completed today, but it was determined that the patient will need to return for further testing. Order was placed and she will be scheduled. The final report will be written when she returns.    Bethany Reels Madaline Brilliant, MD            [facility]  Clinical Neurophysiology Laboratory  [place], [place]         Patient: Bethany Meyer Date of Birth: November 24, 1955  [facility] #: [id number] Handedness: Right  Sex: Female      Visit Date: 04/23/2022 8:07 AM  Age: 66 Years  Attending: Valeda Malm, MD   Req Provider: Precious Reel, PA   EMG Tech: Blanche East   Current Height: 5 feet 7 inch    History/Exam: Bethany Meyer presents for left more than right hand paresthesias. She has a history of RA.  She has had these symptoms chronically and wears a splint. She also has neck pain with some radiation. She also notes focal pain in her proximal volar forearm with deep palpation.  Right hand strength is full. Left hand strength diffusely limited by pain, however comparing DIP flexion of 5th digit to IP flexion in first digit shows significant difference of full strength in the 5th digit and 2/5 strength in first.  Carpal tunnel compression test is very positive, however patient had diffuse TTP.  There is no atrophy.      Motor NCS      Nerve / Sites Muscle Latency Ref. Amplitude Ref. Dur. Distance Lat Diff Velocity Ref.     ms ms mV mV ms cm ms m/s m/s   L Median - APB      Wrist APB 6.00 ?4.40 9.6 ?4.2 7.38 8         Elbow APB 10.48  9.4  7.75 21 4.48 46.9 ?49.0   R Median - APB      Wrist APB 5.35 ?4.40 11.7 ?4.2 5.83 8         Elbow APB 10.02  11.5  6.42 23 4.67 49.3 ?49.0   L Ulnar - ADM      Wrist ADM 4.00 ?4.00 5.3 ?5.1 6.77 8         B.Elbow ADM 7.81  5.2  7.17 19 3.81 49.8 ?49.0      A.Elbow ADM 10.50  5.0  7.15 11 2.69 40.9 ?49.0       F  Wave      Nerve F min Ref.    ms ms   L Median - APB 34.9 ?31.0   L Ulnar - ADM 32.9 ?31.0   R Median - APB 33.7 ?31.0       Sensory NCS      Nerve / Sites Rec. Site Peak Lat Ref. PP Amp Ref. Distance Vel.     ms ms ??V ??V cm m/s   L Median - Dig II (Antidromic)      Wrist Index NR ?3.80 NR ?10.0 14 NR   R Median - Dig II (Antidromic)      Wrist Index 4.60 ?3.80 27.7 ?10.0 14 38   L Ulnar - Dig V (Antidromic)      Wrist Dig V 3.58 ?3.80 12.7 ?10.0 14 51   L Radial - Superficial (Antidromic)      Forearm Wrist 2.27 ?2.80 12.0  10 63       EMG Summary Table     Spontaneous MUP Recruitment Comment   Muscle Nerve Roots Fib PSW  Fasc Other Amp Dur. PPP Pattern Other   L. First dorsal interosseous Ulnar C8-T1 None None None None 1+ N N Normal None   L. Abductor pollicis brevis Median C8-T1 None None None None 1+ 1+ N Normal None   L. Extensor digitorum communis Radial C7-C8 None None None None 1+ 1+ N Normal None   L. Pronator teres Median C6-C7 None None None None N N N Normal None   L. Deltoid Axillary C5-C6 None None None None N N N Normal None       Summary:  After identifying the patient in the waiting room and reviewing all appropriately available medical records, the patient was taken back to the examination room where the procedure was explained, the sites of examination were noted, the patient's questions were answered, and the patient's verbal consent for the procedure was obtained. Studies were performed at [facility] using a radiant warmer and [employer] Synergy EMG system.    Left median motor NCS demonstrates prolonged latency, normal amplitude and slowing of the conduction velocity.    Right median motor NCS demonstrates prolonged latency, normal amplitude and normal conduction velocity.    Left ulnar motor NCS demonstrates normal latency, borderline amplitude and slowing of the conduction velocity across the elbow.    Left median sensory NCS demonstrates no response.    Right median sensory NCS demonstrates prolonged latency and normal amplitude.    Left ulnar sensory NCS demonstrates normal latency and normal but borderline amplitude.    Left radial sensory NCS is normal.    Left upper limb EMG was performed.  No muscle demonstrates increased abnormal spontaneous activity.   The FDI, APB and EDC all demonstrate evidence of chronic reinnervation.    Sonographic evaluation was performed in the left upper limb.    Left median nerve measures 12mm2 in the forearm and 26mm2 at the wrist which are normal values.  It has normal morphology and echogenicity at the wrist. Surrounding anatomy demonstrates increased neovascularization in the connective tissues.    Left median nerve at the elbow measures 76mm2 which is normal, with normal morphology. It measures 33mm3 deep to the pronator, which is normal, though there are some enlarged fascicles, which is abnormal. There is also what appears to be focal enlargement of the AIN under the pronator. This enlargement is isoechoic and nonvascular. This is abnormal.      Images are saved in a permanent location.      Conclusions (preliminary findings until patient returns to complete test):  This is an abnormal study.    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 Sonographic evaluation is benign.      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 Sonographic evaluation was not performed today. Patient will return for this portion of the study.    --Left lower cervical chronic radiculopathy without active denervation.      There is sonographic evidence of     --Left AIN mononeuropathy. Sonographic findings demonstrate focal isoechoic enlargement of the AIN beneath the pronator teres without clear impingement. The patient will return for EMG evaluation of this nerve.    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 This is likely related to underlying RA.          - - - - - - - - - - - -[place], MD  Attending Electromyographer

## 2022-04-28 DIAGNOSIS — M0579 Rheumatoid arthritis with rheumatoid factor of multiple sites without organ or systems involvement: Principal | ICD-10-CM

## 2022-04-30 ENCOUNTER — Ambulatory Visit: Admit: 2022-04-30 | Discharge: 2022-05-01 | Payer: MEDICARE

## 2022-04-30 DIAGNOSIS — M0579 Rheumatoid arthritis with rheumatoid factor of multiple sites without organ or systems involvement: Principal | ICD-10-CM

## 2022-04-30 LAB — CBC W/ AUTO DIFF
BASOPHILS ABSOLUTE COUNT: 0.1 10*9/L (ref 0.0–0.1)
BASOPHILS RELATIVE PERCENT: 0.7 %
EOSINOPHILS ABSOLUTE COUNT: 0.1 10*9/L (ref 0.0–0.5)
EOSINOPHILS RELATIVE PERCENT: 1.4 %
HEMATOCRIT: 41.1 % (ref 34.0–44.0)
HEMOGLOBIN: 13.6 g/dL (ref 11.3–14.9)
LYMPHOCYTES ABSOLUTE COUNT: 2 10*9/L (ref 1.1–3.6)
LYMPHOCYTES RELATIVE PERCENT: 20.4 %
MEAN CORPUSCULAR HEMOGLOBIN CONC: 33.1 g/dL (ref 32.0–36.0)
MEAN CORPUSCULAR HEMOGLOBIN: 32.5 pg — ABNORMAL HIGH (ref 25.9–32.4)
MEAN CORPUSCULAR VOLUME: 98.2 fL — ABNORMAL HIGH (ref 77.6–95.7)
MEAN PLATELET VOLUME: 7 fL (ref 6.8–10.7)
MONOCYTES ABSOLUTE COUNT: 0.6 10*9/L (ref 0.3–0.8)
MONOCYTES RELATIVE PERCENT: 6.7 %
NEUTROPHILS ABSOLUTE COUNT: 6.8 10*9/L (ref 1.8–7.8)
NEUTROPHILS RELATIVE PERCENT: 70.8 %
PLATELET COUNT: 427 10*9/L (ref 150–450)
RED BLOOD CELL COUNT: 4.18 10*12/L (ref 3.95–5.13)
RED CELL DISTRIBUTION WIDTH: 14.6 % (ref 12.2–15.2)
WBC ADJUSTED: 9.7 10*9/L (ref 3.6–11.2)

## 2022-04-30 LAB — PROTEIN / CREATININE RATIO, URINE
CREATININE, URINE: 45 mg/dL
PROTEIN URINE: 12.2 mg/dL
PROTEIN/CREAT RATIO, URINE: 0.271

## 2022-04-30 MED ADMIN — acetaminophen (TYLENOL) 325 MG tablet: ORAL | @ 14:00:00 | Stop: 2022-04-30

## 2022-04-30 MED ADMIN — diphenhydrAMINE (BENADRYL) 50 mg/mL injection: INTRAVENOUS | @ 14:00:00 | Stop: 2022-04-30

## 2022-04-30 MED ADMIN — riTUXimab-abbs (TRUXIMA) 1,000 mg in sodium chloride (NS) 0.9 % 640 mL IVPB: 1000 mg | INTRAVENOUS | @ 14:00:00 | Stop: 2022-04-30

## 2022-04-30 MED ADMIN — diphenhydrAMINE (BENADRYL) injection 25 mg: 25 mg | INTRAVENOUS | @ 14:00:00 | Stop: 2022-04-30

## 2022-04-30 MED ADMIN — methylPREDNISolone sodium succinate (SOLU-Medrol) 40 mg injection: @ 14:00:00 | Stop: 2022-04-30

## 2022-04-30 MED ADMIN — acetaminophen (TYLENOL) tablet 650 mg: 650 mg | ORAL | @ 14:00:00 | Stop: 2022-04-30

## 2022-04-30 NOTE — Unmapped (Signed)
1610 Patient presents for initial Truxima (rituximab-abbs) 1/2 infusion.  In no acute distress.  Vitals stable.  Reports no new medical issues or S/S of infection.  IV started 0850.  See MAR for premeds. WBC 9.7, platelets 427 done today.     9604 Truxima (rituximab-abbs) 1000 mg infusing as follows:     50 mg/hr for 30 min   100 mg/hr for 30 min  150 mg/hr for 30 min  200 mg/hr for 30 min  250 mgl/hr for 30 min  300 mgl/hr for 30 min  350 mg/hr for 30 min  400 mg/hr for the rest of the infusion.    1347 Truxima (rituximab-abbs) infusion complete. PIV flushed with NS.  Vitals stable.  Patient without any s/s of adverse reaction.    1348 IV d/c'd.  Patient discharged from Infusion Center.

## 2022-05-02 NOTE — Unmapped (Signed)
[facility] Refill Coordination Note    Specialty Medication(s) to be Shipped:   Inflammatory Disorders: Rasuvo    Other medication(s) to be shipped: No additional medications requested for fill at this time     Vernie Ammons, DOB: 02/09/1956  Phone: [phone] (home)       All above HIPAA information was verified with patient.     Was a Nurse, learning disability used for this call? No    Completed refill call assessment today to schedule patient's medication shipment from the [facility] [phone]).  All relevant notes have been reviewed.     Specialty medication(s) and dose(s) confirmed: Regimen is correct and unchanged.   Changes to medications: Abigale reports no changes at this time.  Changes to insurance: No  New side effects reported not previously addressed with a pharmacist or physician: None reported  Questions for the pharmacist: No    Confirmed patient received a Conservation officer, historic buildings and a Surveyor, mining with first shipment. The patient will receive a drug information handout for each medication shipped and additional FDA Medication Guides as required.       DISEASE/MEDICATION-SPECIFIC INFORMATION        For patients on injectable medications: Patient currently has 0 doses left.  Next injection is scheduled for 05/08/22.    SPECIALTY MEDICATION ADHERENCE     Medication Adherence    Patient reported X missed doses in the last month: 0  Specialty Medication: Rasuvo  Patient is on additional specialty medications: No                                Were doses missed due to medication being on hold? No      REFERRAL TO PHARMACIST     Referral to the pharmacist: Not needed      [facility]     Shipping address confirmed in Epic.     Delivery Scheduled: Yes, Expected medication delivery date: 05/07/22.     Medication will be delivered via UPS to the prescription address in Epic WAM.    [place] A Joyceann Kruser   [facility] Specialty Technician

## 2022-05-03 ENCOUNTER — Ambulatory Visit: Admit: 2022-05-03 | Discharge: 2022-05-04 | Payer: MEDICARE

## 2022-05-03 DIAGNOSIS — G5602 Carpal tunnel syndrome, left upper limb: Principal | ICD-10-CM

## 2022-05-03 DIAGNOSIS — M069 Rheumatoid arthritis, unspecified: Principal | ICD-10-CM

## 2022-05-03 DIAGNOSIS — Z23 Encounter for immunization: Principal | ICD-10-CM

## 2022-05-03 DIAGNOSIS — M2540 Effusion, unspecified joint: Principal | ICD-10-CM

## 2022-05-03 DIAGNOSIS — Z13220 Encounter for screening for lipoid disorders: Principal | ICD-10-CM

## 2022-05-03 DIAGNOSIS — F32A Depression, unspecified depression type: Principal | ICD-10-CM

## 2022-05-03 DIAGNOSIS — G4700 Insomnia, unspecified: Principal | ICD-10-CM

## 2022-05-03 LAB — COMPREHENSIVE METABOLIC PANEL
ALBUMIN: 3.3 g/dL — ABNORMAL LOW (ref 3.4–5.0)
ALKALINE PHOSPHATASE: 76 U/L (ref 46–116)
ALT (SGPT): 9 U/L — ABNORMAL LOW (ref 10–49)
ANION GAP: 6 mmol/L (ref 5–14)
AST (SGOT): 22 U/L (ref <=34)
BILIRUBIN TOTAL: 0.2 mg/dL — ABNORMAL LOW (ref 0.3–1.2)
BLOOD UREA NITROGEN: 14 mg/dL (ref 9–23)
BUN / CREAT RATIO: 19
CALCIUM: 9.3 mg/dL (ref 8.7–10.4)
CHLORIDE: 110 mmol/L — ABNORMAL HIGH (ref 98–107)
CO2: 22.3 mmol/L (ref 20.0–31.0)
CREATININE: 0.75 mg/dL
EGFR CKD-EPI (2021) FEMALE: 88 mL/min/{1.73_m2} (ref >=60)
GLUCOSE RANDOM: 81 mg/dL (ref 70–179)
POTASSIUM: 4.4 mmol/L (ref 3.4–4.8)
PROTEIN TOTAL: 7.8 g/dL (ref 5.7–8.2)
SODIUM: 138 mmol/L (ref 135–145)

## 2022-05-03 LAB — LIPID PANEL
CHOLESTEROL/HDL RATIO SCREEN: 2.8 (ref 1.0–4.5)
CHOLESTEROL: 133 mg/dL (ref <=200)
HDL CHOLESTEROL: 48 mg/dL (ref 40–60)
LDL CHOLESTEROL CALCULATED: 54 mg/dL (ref 40–99)
NON-HDL CHOLESTEROL: 85 mg/dL (ref 70–130)
TRIGLYCERIDES: 157 mg/dL — ABNORMAL HIGH (ref 0–150)
VLDL CHOLESTEROL CAL: 31.4 mg/dL (ref 11–41)

## 2022-05-03 LAB — SEDIMENTATION RATE: ERYTHROCYTE SEDIMENTATION RATE: 125 mm/h — ABNORMAL HIGH (ref 0–30)

## 2022-05-03 LAB — CBC
HEMATOCRIT: 38.5 % (ref 34.0–44.0)
HEMOGLOBIN: 13.3 g/dL (ref 11.3–14.9)
MEAN CORPUSCULAR HEMOGLOBIN CONC: 34.5 g/dL (ref 32.0–36.0)
MEAN CORPUSCULAR HEMOGLOBIN: 33.8 pg — ABNORMAL HIGH (ref 25.9–32.4)
MEAN CORPUSCULAR VOLUME: 97.9 fL — ABNORMAL HIGH (ref 77.6–95.7)
MEAN PLATELET VOLUME: 7 fL (ref 6.8–10.7)
PLATELET COUNT: 385 10*9/L (ref 150–450)
RED BLOOD CELL COUNT: 3.94 10*12/L — ABNORMAL LOW (ref 3.95–5.13)
RED CELL DISTRIBUTION WIDTH: 14.2 % (ref 12.2–15.2)
WBC ADJUSTED: 9.1 10*9/L (ref 3.6–11.2)

## 2022-05-03 MED ORDER — TRAMADOL 50 MG TABLET
ORAL_TABLET | Freq: Four times a day (QID) | ORAL | 0 refills | 8 days | Status: CP | PRN
Start: 2022-05-03 — End: ?

## 2022-05-03 MED ORDER — BUPROPION HCL XL 150 MG 24 HR TABLET, EXTENDED RELEASE
ORAL_TABLET | Freq: Every morning | ORAL | 2 refills | 90 days | Status: CP
Start: 2022-05-03 — End: ?

## 2022-05-03 MED ORDER — TRAZODONE 100 MG TABLET
ORAL_TABLET | 1 refills | 0 days | Status: CP
Start: 2022-05-03 — End: ?

## 2022-05-03 NOTE — Unmapped (Signed)
Internal Medicine Clinic    Return Visit    Face to Face    Assessment and Plan:     Diagnosis ICD-10-CM Associated Orders   1. Painful swelling of joint  M25.40 Sedimentation Rate     Comprehensive Metabolic Panel     CBC     C3 complement     C4 complement      2. Depression, unspecified depression type  F32.A buPROPion (WELLBUTRIN XL) 150 MG 24 hr tablet      3. Screening, lipid  Z13.220 Lipid Panel      4. Rheumatoid arthritis, involving unspecified site, unspecified whether rheumatoid factor present (CMS-HCC)  M06.9       5. Insomnia, unspecified type  G47.00       6. Carpal tunnel syndrome of left wrist  G56.02             HCM covid booster today.  She will need Tdap and meningococcal B series    RA back on methotrexate and Rutuximab infusions.   See below re joint swelling    CTS/joint swelling bl, L>R  Has seen ortho hand  Swelling in the hand concerning for rheumatologic process as well  Checking inflammatory markers, compliment levels, CBC, CMP today  She has continued nerve studies next week  Will also try and consult with her rheumatologist    Insomnia and depression  Phq9 12 no SI  Amitriptyline did not help  Restart wellbutrin  Add trazodone at night    Pain control  Continue gabapentin total daily dose is 3000 mg currently  Adding tramadol for severe pain  [place] Controlled Substance Database was reviewed without abnormality    Spine stenosis  Symptoms much better after surgery    Fu 2 mo    I spent a total of 35 min total including pre intra and post visit activities    Medication adherence and barriers to the treatment plan have been addressed. Opportunities to optimize healthy behaviors have been discussed. Patient / caregiver voiced understanding.      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    Paraesophageal hernia 05/08/2019    Spinal stenosis of lumbar region with neurogenic claudication 09/18/2018    Spondylolisthesis of lumbar region 09/18/2018    Piriformis syndrome of both sides 03/11/2018    Stage 1 mild COPD by GOLD classification (CMS-HCC) 02/25/2017    Rheumatoid arthritis involving both feet (CMS-HCC)     Immunosuppressed status (CMS-HCC) 01/27/2016    Osteomyelitis (CMS-HCC) 01/26/2016    Atrial flutter (CMS-HCC) 12/25/2015    Rheumatoid arthritis (CMS-HCC) 01/11/2015    Anxiety 02/10/2013    Depression 02/10/2013    Functional Abdominal pain 08/29/2012    Gastritis 08/26/2012    GERD (gastroesophageal reflux disease) 08/24/2012    Tobacco use disorder 01/01/2011       HPI:  Bethany Meyer is a pleasant 66 yo female here for follow up.    If I could rest, world would be so much better.  Having trouble sleeping.      Takes meds and may sleep 45 min to an hour.  Waking again in pain.    Left hand is aching when waking.  Having more nerve studies next week.    Hand and  right foot swelling.    Hard to wear the brace at night. So she stopped    Pain in left hand is achy in the wrist, burning and tingling in the fingers, numbness.      Right hand too, but left is so much worse.    Not taking wellbutrin right now, hard to stay on top of things these days.        Current Outpatient Medications on File Prior to Visit   Medication Sig    acetaminophen (TYLENOL) 325 MG tablet Take 1 tablet (325 mg total) by mouth two (2) times a day as needed for pain.    alendronate (FOSAMAX) 70 MG tablet Take 1 tablet (70 mg total) by mouth every seven (7) days.    amitriptyline (ELAVIL) 25 MG tablet Take 1 tablet (25 mg total) by mouth nightly.    arm brace Misc Boxer splint, metacarpal wrist brace    aspirin 81 MG chewable tablet CHEW 1 TABLET BY MOUTH DAILY    buPROPion (WELLBUTRIN XL) 150 MG 24 hr tablet Take 1 tablet (150 mg total) by mouth every morning. dicyclomine (BENTYL) 20 mg tablet Take 1 tablet (20 mg total) by mouth four times a day as needed (stomach cramps).    empty container Misc Use as directed    estradioL (ESTRACE) 0.01 % (0.1 mg/gram) vaginal cream Insert 2 g into the vagina Two (2) times a week. Insert 2 g inside vagina and apply a pea-sized amount around urethra twice weekly    ferrous sulfate 325 (65 FE) MG EC tablet Take 1 tablet (325 mg total) by mouth every other day.    folic acid (FOLVITE) 1 MG tablet Take 1 tablet (1 mg total) by mouth daily.    gabapentin (NEURONTIN) 300 MG capsule 3 capsules po morning and midday, 4 capsules po qhs    haloperidoL (HALDOL) 0.5 MG tablet Take 1 tablet (0.5 mg total) by mouth daily before breakfast.    hydrOXYzine (ATARAX) 10 MG tablet TAKE 1 TABLET (10 MG TOTAL) BY MOUTH 3 TIMES A DAY AS NEEDED FOR ITCHING    leg brace (KNEE SUPPORT BRACE) Misc 1 each by Miscellaneous route daily as needed.    methocarbamoL (ROBAXIN) 500 MG tablet Take 1 tablet (500 mg total) by mouth Three times a day.    methotrexate, PF, 25 mg/0.5 mL [name] Inject the contents of 1 auto-injector (25 mg total) under the skin every seven (7) days.    needle, disp, 25 gauge 25 gauge x 5/8 Ndle For methotrexate injections    nicotine (NICODERM CQ) 14 mg/24 hr patch Place 1 patch on the skin daily.    nicotine (NICODERM CQ) 14 mg/24 hr patch Place 1 patch on the skin daily.    VITAMIN B-1 100 mg tablet Take 1 tablet (100 mg total) by mouth daily.     No current facility-administered medications on file prior to visit.        Review of Systems -  HPI    Exam:  BP 123/76  - Pulse 69  - Temp 36.3 ??C (97.4 ??F) (Tympanic)  - Ht 167.6 cm (5' 6)  - Wt 56.2 kg (124 lb)  - LMP 05/20/2005  - SpO2 99%  - BMI 20.01 kg/m??     Gen:  Pleasant in NAD  Hands  Wearing glove on left hand  Subtle swelling of posterior MCP  Good radial pulses and perfusion  No small joint swelling  2+ radial pulses bl  Some thenar wasting left      Laural Benes, PA-C  Supervising physician Gabrielle Dare, MD

## 2022-05-03 NOTE — Unmapped (Signed)
[facility] at [facility]     Type of visit: face to face    Are you located in [place]? (for virtual visits only) N/A    Reason for visit: Follow up    Screening BP- 123/76    HCDM reviewed and updated in Epic:    We are working to make sure all of our patients??? wishes are updated in Epic and part of that is documenting a Environmental health practitioner for each patient  A Health Care Decision Maker is someone you choose who can make health care decisions for you if you are not able - who would you most want to do this for you????  is already up to date.    HCDM (patient stated preference): Cleon Gustin - Daughter - [phone]    HCDM (patient stated preference): [employer] - Sister - [phone]    BPAs completed:  PHQ9    COVID-19 Vaccine Summary  Which COVID-19 Vaccine was administered  Moderna  Type:  Dates Given:  11/01/2021                     Immunization History   Administered Date(s) Administered    COVID-19 VAC,BIVALENT(31YR UP),PFIZER 05/03/2021, 11/01/2021    COVID-19 VACCINE,MRNA(MODERNA)(PF) 06/29/2019, 07/27/2019, 04/07/2020    DTP 12/22/1962, 02/04/1963, 07/22/1963    HEPATITIS B VACCINE ADULT,IM(ENERGIX B, RECOMBIVAX) 01/03/2015, 03/13/2015    Hepatitis B Vaccine, Unspecified Formulation 08/19/1996, 02/24/1997    INFLUENZA QUAD HIGH DOSE 26YRS+(FLUZONE) 03/03/2019    Influenza Vaccine Quad(IM)6 MO-Adult(PF) 02/10/2013, 05/03/2014, 03/13/2015, 03/20/2016, 07/12/2017, 03/11/2018, 02/14/2020, 05/03/2021, 02/01/2022    Influenza Virus Vaccine, unspecified formulation 02/10/2013, 05/03/2014, 03/13/2015, 03/20/2016, 07/12/2017, 03/11/2018    MMR 12/07/1998, 12/20/1998, 04/04/1999    Meningococcal Conjugate MCV4P 05/03/2021    OPV 07/19/1962, 09/13/1962, 12/22/1962    PNEUMOCOCCAL POLYSACCHARIDE 23-VALENT 01/11/2015    Pneumococcal Conjugate 20-valent 11/01/2021    Pneumococcal conjugate -PCV7 02/18/2012    TD(TDVAX),ADSORBED,2LF(IM)(PF) 12/07/1998    TdaP 01/01/2011 __________________________________________________________________________________________    SCREENINGS COMPLETED IN FLOWSHEETS    PHQ9  Thoughts that you would be better off dead, or of hurting yourself in some way: Not at all  PHQ-9 TOTAL SCORE: 12

## 2022-05-03 NOTE — Unmapped (Signed)
Labs today  Add trazodone for sleep before bed  Tramadol for severe pain  Will try to discuss your pain and joint swelling with your rheumatologist as well

## 2022-05-06 MED FILL — RASUVO (PF) 25 MG/0.5 ML SUBCUTANEOUS AUTO-INJECTOR: SUBCUTANEOUS | 28 days supply | Qty: 2 | Fill #2

## 2022-05-07 DIAGNOSIS — G5602 Carpal tunnel syndrome, left upper limb: Principal | ICD-10-CM

## 2022-05-07 DIAGNOSIS — M069 Rheumatoid arthritis, unspecified: Principal | ICD-10-CM

## 2022-05-07 MED ORDER — PREDNISONE 10 MG TABLET
ORAL_TABLET | Freq: Every day | ORAL | 0 refills | 30 days | Status: CP
Start: 2022-05-07 — End: ?

## 2022-05-11 NOTE — Unmapped (Signed)
LVMM to call pt to provide message from PCP.

## 2022-05-11 NOTE — Unmapped (Signed)
-----   Message from [place], [place] sent at 05/07/2022 11:04 AM EST -----  Hi could we give ms Grover a quick call?  I added prednisone 10 mg each morning due to RA and elevated sed rate with her carpal tunnel.  Just want to make sure she knows to start this and also not to stop it abruptly.  She sees rheumatology in [place]  y and should keep this appointment for follow up so we can help her with the dose or tapering down.  Thanks!

## 2022-05-14 NOTE — Unmapped (Signed)
Called and spoke to pt , informed her per Laural Benes to take:  prednisone 10 mg each morning due to RA and elevated sed rate with her carpal tunnel.  Just want to make sure she knows to start this and also not to stop it abruptly.  She sees rheumatology in January and should keep this appointment for follow up so we can help her with the dose or tapering down her prednisone.   Pt verbalizes understanding . She will call pharmacy and pick up her prednisone.

## 2022-05-14 NOTE — Unmapped (Signed)
Left message for pt to call [facility] at [phone].

## 2022-05-22 NOTE — Unmapped (Signed)
Addended by: Andrez Grime on: 05/22/2022 12:16 PM     Modules accepted: Orders

## 2022-05-29 ENCOUNTER — Encounter
Admit: 2022-05-29 | Discharge: 2022-05-29 | Payer: MEDICARE | Attending: Student in an Organized Health Care Education/Training Program | Primary: Student in an Organized Health Care Education/Training Program

## 2022-05-29 ENCOUNTER — Ambulatory Visit: Admit: 2022-05-29 | Discharge: 2022-05-29 | Payer: MEDICARE

## 2022-05-29 MED ORDER — PREDNISOLONE ACETATE 1 % EYE DROPS,SUSPENSION
Freq: Four times a day (QID) | OPHTHALMIC | 0 refills | 25 days | Status: CP
Start: 2022-05-29 — End: ?

## 2022-05-29 MED ORDER — MOXIFLOXACIN 0.5 % EYE DROPS
Freq: Four times a day (QID) | OPHTHALMIC | 0 refills | 15 days | Status: CP
Start: 2022-05-29 — End: ?

## 2022-05-29 MED ADMIN — chondroitin-sodium hyaluronate (DUOVISC) 3 %-4 %(0.35mL) 1 % (0.4 mL) intraocular kit: OPHTHALMIC | @ 20:00:00 | Stop: 2022-05-29

## 2022-05-29 MED ADMIN — phenylephrine (NEO-SYNEPHRINE) 10 % ophthalmic solution 1 drop: 1 [drp] | OPHTHALMIC | @ 17:00:00 | Stop: 2022-05-29

## 2022-05-29 MED ADMIN — midazolam (VERSED) injection: INTRAVENOUS | @ 19:00:00 | Stop: 2022-05-29

## 2022-05-29 MED ADMIN — remifentanil (ULTIVA) injection: INTRAVENOUS | @ 19:00:00 | Stop: 2022-05-29

## 2022-05-29 MED ADMIN — tetracaine HCl (PF) (ALTACAINE) 0.5 %: OPHTHALMIC | @ 19:00:00 | Stop: 2022-05-29

## 2022-05-29 MED ADMIN — tropicamide (MYDRIACYL) 1 % ophthalmic solution 1 drop: 1 [drp] | OPHTHALMIC | @ 17:00:00 | Stop: 2022-05-29

## 2022-05-29 MED ADMIN — trypan blue intraocular injection: INTRAOCULAR | @ 20:00:00 | Stop: 2022-05-29

## 2022-05-29 MED ADMIN — neomycin-polymyxin-dexamethamethasone (MAXITROL) ophthalmic ointment: OPHTHALMIC | @ 20:00:00 | Stop: 2022-05-29

## 2022-05-29 MED ADMIN — EPINEPHrine 0.3 mg in BSS irrigation: INTRAOCULAR | @ 19:00:00 | Stop: 2022-05-29

## 2022-05-29 MED ADMIN — bupivacaine (PF) (MARCAINE) 0.75 % (7.5 mg/mL) 5 mL, lidocaine (XYLOCAINE) 20 mg/mL (2 %) 5 mL OR syringe: OPHTHALMIC | @ 19:00:00 | Stop: 2022-05-29

## 2022-05-29 MED ADMIN — Buffered lidocaine 0.75%/EPINEPHrine 1:4000 (1 mg/mL) EPI-Shugarcaine 2 mL syringe: INTRACAMERAL | @ 20:00:00 | Stop: 2022-05-29

## 2022-05-29 MED ADMIN — balanced salt irrigation solution (BSS) ophthalmic irrigation solution: OPHTHALMIC | @ 19:00:00 | Stop: 2022-05-29

## 2022-05-29 MED ADMIN — glycopyrrolate (ROBINUL) injection: INTRAVENOUS | @ 19:00:00 | Stop: 2022-05-29

## 2022-05-29 NOTE — Unmapped (Signed)
Preoperative diagnosis    1. Visually Significant Nuclear sclerotic cataract with PSC, Right eye    Postoperative diagnosis    1. Same    Procedure    1. Simple Phacoemulsfication cataract extraction Right eye with implantation of DIB00 +15.00D    Attending: Garner Gavel, MD, MD    Resident surgeon: Andrez Grime, MD, MD    Anesthesia: MAC + Subtenons    Estimated blood loss: <1 cc    Specimens: None    Implants:   Implant Name Type Inv. Item Serial No. Manufacturer Lot No. LRB No. Used Action   Lens Tecnis Monofocal 15.0 - [id number] Intraocular Lens Lens Tecnis Monofocal 15.0 [id number] [employer]  Right 1 Implanted         Findings: Same as above    Complications: None    Procedure:     The patient was greeted in the preoperative holding area where again the risks, benefits, and alternatives to cataract surgery in the left eye was discussed with the patient. The consent was confirmed to be signed and witnessed in the chart. The left eye was marked as the operative eye and the patient was taken to the operating room. Timeout was conducted. The patient was then reclined to the supine position and prepped and draped in the usual sterile fashion for eye surgery. The operating microscope was moved into place. Timeout was then conducted. A sub-Tenon's block was performed in the right eye.  The eye was fixated and a 1.15 mm slit knife was used to make a paracentesis. Air followed by trypan blue was instilled into the anterior chamber to better visualize the lens capsule. Balanced salt solution was used to irrigate out the trypan blue. Lidocaine followed by viscoelastic device was injected to fill the anterior chamber. A 2.4 mm keratome was used to make a clear corneal incision at the temporal limbus.  A disposable cystotome was used to puncture the anterior capsule and start a flap for a continuous curvilinear capsulorhexis. Capsulorhexis forceps were used to make a continuous curvilinear capsulorhexis. Gentle hydrodissection was performed with BSS. The phacoemulsfication handpiece along with a second instrument was used to remove nuclear and epinuclear material using a divide and conquer technique.  Cortical material was removed using the irrigation and aspiration handpiece.  Viscoelastic device was injected to fill the capsular bag. The lens was loaded and confirmed to be free of defects. The intraocular lens was injected and placed into the capsular bag.  The intraocular lens was centered. Viscoelastic was removed. The wounds were hydrated. The wound was checked for leak and no leak was found.  The lid speculum and drapes were removed. The surgical area was cleaned.  A drop of antibiotic was administered to the operative eye.  The patient's eye was shielded and they were taking to the post-anesthesia care unit in good condition. The patient will follow up tomorrow in the eye clinic.    Surgeon Notes: I performed all key portions of the procedure with supervision from Dr. Jhonnie Garner throughout the entirety of the case.    Ellender Hose, MD   [facility] Ophthalmology Department, PGY-3

## 2022-05-29 NOTE — Unmapped (Signed)
Ophthalmology Pre-Operative H&P      History of Present Illness:  67 y.o. female here today for cataract surgery, right eye.         Allergies:  Patient has no known allergies.    Medications:   No current facility-administered medications for this encounter.     Current Outpatient Medications   Medication Sig Dispense Refill    acetaminophen (TYLENOL) 325 MG tablet Take 1 tablet (325 mg total) by mouth two (2) times a day as needed for pain.      alendronate (FOSAMAX) 70 MG tablet Take 1 tablet (70 mg total) by mouth every seven (7) days. 12 tablet 3    arm brace Misc Boxer splint, metacarpal wrist brace 1 each 0    aspirin 81 MG chewable tablet CHEW 1 TABLET BY MOUTH DAILY 36 tablet 3    buPROPion (WELLBUTRIN XL) 150 MG 24 hr tablet Take 1 tablet (150 mg total) by mouth every morning. 90 tablet 2    dicyclomine (BENTYL) 20 mg tablet Take 1 tablet (20 mg total) by mouth four times a day as needed (stomach cramps). 360 tablet 3    empty container Misc Use as directed 1 each 2    estradioL (ESTRACE) 0.01 % (0.1 mg/gram) vaginal cream Insert 2 g into the vagina Two (2) times a week. Insert 2 g inside vagina and apply a pea-sized amount around urethra twice weekly  0    ferrous sulfate 325 (65 FE) MG EC tablet Take 1 tablet (325 mg total) by mouth every other day. 45 tablet 3    folic acid (FOLVITE) 1 MG tablet Take 1 tablet (1 mg total) by mouth daily. 90 tablet 3    gabapentin (NEURONTIN) 300 MG capsule 3 capsules po morning and midday, 4 capsules po qhs 360 capsule 3    haloperidoL (HALDOL) 0.5 MG tablet Take 1 tablet (0.5 mg total) by mouth daily before breakfast. 90 tablet 3    hydrOXYzine (ATARAX) 10 MG tablet TAKE 1 TABLET (10 MG TOTAL) BY MOUTH 3 TIMES A DAY AS NEEDED FOR ITCHING 270 tablet 1    leg brace (KNEE SUPPORT BRACE) Misc 1 each by Miscellaneous route daily as needed. 1 each 0    methocarbamoL (ROBAXIN) 500 MG tablet Take 1 tablet (500 mg total) by mouth Three times a day. 90 tablet 1    methotrexate, PF, 25 mg/0.5 mL [name] Inject the contents of 1 auto-injector (25 mg total) under the skin every seven (7) days. 2 mL 5    needle, disp, 25 gauge 25 gauge x 5/8 Ndle For methotrexate injections 100 each 0    nicotine (NICODERM CQ) 14 mg/24 hr patch Place 1 patch on the skin daily. 28 patch 1    nicotine (NICODERM CQ) 14 mg/24 hr patch Place 1 patch on the skin daily. 28 patch 0    predniSONE (DELTASONE) 10 MG tablet Take 1 tablet (10 mg total) by mouth daily. 30 tablet 0    traMADol (ULTRAM) 50 mg tablet Take 1 tablet (50 mg total) by mouth every six (6) hours as needed for pain. 30 tablet 0    traZODone (DESYREL) 100 MG tablet 1/2 tablet for 3 nights then increase to 1 tablet before bed 90 tablet 1    VITAMIN B-1 100 mg tablet Take 1 tablet (100 mg total) by mouth daily. 100 tablet 3     No medications prior to admission.       Past Medical History:  Diagnosis Date    Arthritis     RA    Cataract     Functional dyspepsia     GERD (gastroesophageal reflux disease)     Gout     RA (rheumatoid arthritis) (CMS-HCC)     Smoking     Stage 1 mild COPD by GOLD classification (CMS-HCC) 02/25/2017       Past Surgical History:   Procedure Laterality Date    ovarian cyst removed      OVARY SURGERY      PR COLONOSCOPY FLX DX W/COLLJ SPEC WHEN PFRMD N/A 03/05/2013    Procedure: COLONOSCOPY, FLEXIBLE, PROXIMAL TO SPLENIC FLEXURE; DIAGNOSTIC, W/WO COLLECTION SPECIMEN BY BRUSH OR WASH;  Surgeon: Trudie Buckler, MD;  Location: [facility] PROCEDURES [facility];  Service: Gastroenterology    PR COMPRE EP EVAL ABLTJ 3D MAPG TX SVT N/A 12/26/2015    Procedure: A-Flutter Ablation;  Surgeon: Theresia Majors, MD;  Location: [facility] EP;  Service: Cardiology    PR UP GI ENDOSCOPY,BALL DIL,30MM N/A 06/03/2019    Procedure: UGI ENDO; W/BALLOON DILAT ESOPHAGUS (<30MM DIAM);  Surgeon: Neysa Hotter, MD;  [facility];  Service: Gastroenterology    PR UPPER GI ENDOSCOPY,BIOPSY N/A 09/16/2012    Procedure: UGI ENDOSCOPY; WITH BIOPSY, SINGLE OR MULTIPLE;  Surgeon: Brown Human, MD;  [facility]: [facility];  Service: Gastroenterology    PR UPPER GI ENDOSCOPY,BIOPSY N/A 06/03/2019    Procedure: UGI ENDOSCOPY; WITH BIOPSY, SINGLE OR MULTIPLE;  Surgeon: Neysa Hotter, MD;  Location: [facility];  Service: Gastroenterology    PR UPPER GI ENDOSCOPY,DIAGNOSIS N/A 06/03/2019    Procedure: UGI ENDO, INCLUDE ESOPHAGUS, STOMACH, & DUODENUM &/OR JEJUNUM; DX W/WO COLLECTION SPECIMN, BY BRUSH OR WASH;  Surgeon: Neysa Hotter, MD;  Location: [facility];  Service: Gastroenterology       Family History:  family history includes Breast cancer in her mother; Cataracts in her father; Diabetes in her father and mother.  There is no reported history of clotting disorders or anesthesia problems.    Social History:  Tobacco use:   reports that she has been smoking cigarettes. She has a 35.0 pack-year smoking history. She has never used smokeless tobacco.  Alcohol use:   reports no history of alcohol use.  Drug use:  reports current drug use. Drug: Marijuana.    Review of Systems:    10 point review of systems is negative other than HPI.    Objective:     Vital Signs:     No intake/output data recorded.    Physical Exam:  General: NAD.  Neuro/Psych: Alerted and Oriented x 3.  Ambulatory.  Psych: behavior wnl.  HEENT: normocephalic.  EYES: anicteric.  RESP: normal work of breathing.  CV: normal heart rate.  SKIN: warm and dry.    Please page the ophthalmology consults resident or resident on call with questions.

## 2022-05-29 NOTE — Unmapped (Signed)
Wait 3 to 5 Minutes between drops    Be Careful around pets or small children    NO water directly in eye    Wear shield until you see team in Clinic

## 2022-05-30 ENCOUNTER — Ambulatory Visit
Admit: 2022-05-30 | Discharge: 2022-05-31 | Payer: MEDICARE | Attending: Student in an Organized Health Care Education/Training Program | Primary: Student in an Organized Health Care Education/Training Program

## 2022-05-30 DIAGNOSIS — Z9841 Cataract extraction status, right eye: Principal | ICD-10-CM

## 2022-05-30 DIAGNOSIS — Z961 Presence of intraocular lens: Principal | ICD-10-CM

## 2022-05-30 NOTE — Unmapped (Addendum)
#   Pseudophakia, OD  S/p OD CEIOL Cordney Barstow/Ranjan (05/29/2022)  - Complications: none  - Notable intraop findings: none    POD1:  - 1+ cell  - Main incision water-tight, Seidel negative  - Paracentesis water-tight  - Lens in the bag    Plan:  - Printed postoperative instructions and precautions reviewed.  - Shield at bedtime, glasses during the day.   - Continue Moxifloxacin QID x 1 weeks  - Start Pred Forte QID    RTC in 1 week in cataract parallel   - V/T/autoMRx    Ellender Hose, MD   [facility] Ophthalmology Department, PGY-3

## 2022-05-30 NOTE — Unmapped (Signed)
Ophthalmology: No follow up call required as patient seen in surgeons office for follow up today.

## 2022-05-30 NOTE — Unmapped (Signed)
ONE DAY  AFTER SURGERY  INSTRUCTIONS    RIGHT EYE                                                          LEFT EYE  PREDNISOLONE ACETATE 1% (PINK or WHITE TOP)   One drop 4 times per day                  MOXIFLOXACIN 0.5% (TAN TOP)  One drop 4 times per day       INSTRUCTIONS  Wait 2-3 minutes between one drops and the next one.  Sleep with the hard shield taped over the eye for 1 week.  No heavy lifting or straining for 1 week.  DO NOT PRESS OR RUB ON THE EYE - you may gently clean around the eye.  Reading, using computers, or watching TV will not harm the eye.  Report any of the following immediately:  SEVERE PAIN  DECREASED VISION  INCREASED REDNESS OR SWELLING  INJURY TO THE EYE      RESUME DROPS YOU WERE TAKING BEFORE SURGERY.                   ARTIFICIAL TEARS MAY BE USED but not at the same time of the above drops.

## 2022-05-31 NOTE — Unmapped (Signed)
I discussed the findings, assessment, and plan with the resident and agree with the findings and plan as documented in the resident's note, but did not see the patient. I was immediately available via phone/pager or present on site to review the details of the case.     Illias Pantano MD

## 2022-06-02 DIAGNOSIS — M0579 Rheumatoid arthritis with rheumatoid factor of multiple sites without organ or systems involvement: Principal | ICD-10-CM

## 2022-06-07 ENCOUNTER — Ambulatory Visit
Admit: 2022-06-07 | Discharge: 2022-06-08 | Payer: MEDICARE | Attending: Student in an Organized Health Care Education/Training Program | Primary: Student in an Organized Health Care Education/Training Program

## 2022-06-07 DIAGNOSIS — Z961 Presence of intraocular lens: Principal | ICD-10-CM

## 2022-06-07 DIAGNOSIS — Z9841 Cataract extraction status, right eye: Principal | ICD-10-CM

## 2022-06-07 NOTE — Unmapped (Signed)
#   Pseudophakia, OD  S/p OD CEIOL Arber Wiemers/Ranjan (05/29/2022)  - Complications: none  - Notable intraop findings: none    POW1:  - [facility] 20/15  - Main incision water-tight  - Paracentesis water-tight  - Lens in the bag    Manifest Refraction       Manifest Refraction         Sphere Cylinder Dist [facility]    Right [place] Sphere 20/15-2    Left                     Plan:  - Will plan to continue with CEIOL in contralateral eye on 02/14 as scheduled   - Printed postoperative instructions and precautions reviewed.  - Stop Moxifloxacin QID  - Start Pred Forte taper (3-2-1 decrease weekly)    RTC in 3 weeks in cataract parallel   - V/T/D/MRx/MacOCT  Ellender Hose, MD   [facility] Ophthalmology Department, PGY-3

## 2022-06-07 NOTE — Unmapped (Signed)
ONE  WEEK       AFTER SURGERY  INSTRUCTIONS  RIGHT EYE                                                          LEFT EYE  PREDNISOLONE ACETATE 1% (PINK or WHITE TOP)     3 times a day for 1 week, then 2 times a day for 1 week, then daily for 1 week, then STOP             STOP    MOXIFLOXACIN 0.5% (TAN TOP)       INSTRUCTIONS  NO RESTRICTIONS   Report any of the following immediately:  SEVERE PAIN  DECREASED VISION  INCREASED REDNESS OR SWELLING  INJURY TO THE EYE      RESUME DROPS YOU WERE TAKING BEFORE SURGERY.                   ARTIFICIAL TEARS MAY BE USED but not at the same time of the above drops.

## 2022-06-07 NOTE — Unmapped (Signed)
I discussed the findings, assessment, and plan with the resident and agree with the findings and plan as documented in the resident's note, but did not see the patient. I was immediately available via phone/pager or present on site to review the details of the case.     Demiana Crumbley MD

## 2022-06-20 ENCOUNTER — Ambulatory Visit
Admit: 2022-06-20 | Discharge: 2022-06-21 | Payer: MEDICARE | Attending: Student in an Organized Health Care Education/Training Program | Primary: Student in an Organized Health Care Education/Training Program

## 2022-06-20 DIAGNOSIS — M0579 Rheumatoid arthritis with rheumatoid factor of multiple sites without organ or systems involvement: Principal | ICD-10-CM

## 2022-06-20 DIAGNOSIS — M5431 Sciatica, right side: Principal | ICD-10-CM

## 2022-06-20 MED ORDER — HYDROXYZINE HCL 10 MG TABLET
ORAL_TABLET | 1 refills | 0 days
Start: 2022-06-20 — End: ?

## 2022-06-20 MED ORDER — GABAPENTIN 300 MG CAPSULE
ORAL_CAPSULE | 3 refills | 0 days | Status: CP
Start: 2022-06-20 — End: ?

## 2022-06-20 MED ADMIN — lidocaine (XYLOCAINE) 10 mg/mL (1 %) injection 2 mL: 2 mL | @ 15:00:00 | Stop: 2022-06-20

## 2022-06-20 MED ADMIN — methylPREDNISolone acetate (DEPO-Medrol) injection 40 mg: 40 mg | INTRAMUSCULAR | @ 15:00:00 | Stop: 2022-06-20

## 2022-06-20 NOTE — Unmapped (Signed)
Assessment/Plan:   Bethany Meyer is a 67 y.o. 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    Seropositive (+CCP) non erosive RA   Active today but really only limited to L wrist, CDAI technically 15 but other joints are well controlled with restarting MTX and 1g of Ritux (04/2022, was supposed to get 2nd but cancelled). No significant fluid pocket by POC [place] today to aspirate, we agreed to proceed with CS injection and I have asked her to schedule her 2nd Ritux infusion.  - Continue MTX 25mg  SQ  - Continue FA daily  - Ritux 2nd 1g infusion (1st done 04/2022) remains to start Ritux, will re-explore this  - CBC w/ diff, CMP, ESR, CRP already ordered, will collect this today  - Corticosteroid injection to L wrist (see separate procedure note)    Swollen Joint Count (0-28): 1  Tender Joint Count (0-28): 1  Patient Global Assessment of Disease Activity (0-10): 8  Evaluator Global Assessment of Disease (0-10): 5  CDAI Score: 15  CDAI interpretation:  0.0-2.8 Remission   2.9-10.0 Low disease activity   10.1-22.0 Moderate disease activity   22.1-76.0 High disease activity         Spinal stenosis  Patient with an MRI from 12/24/19 demonstrating severe spinal canal stenosis and foraminal narrowing at L4-5. Now with dramatic improvement after spinal surgery.     Osteoporosis - Fall resulting in right proximal femur greater trochanteric fracture  Dexa 02/2021 with left hip T score -2.7 c/w osteoporosis. Started Fosamax but has self-d/c'd multiple times. Has not taken for a significant continuous period. We discussed importance of going back on therapy but in light of focusing on uncontrolled RA we did not restart Fosamax today.  - Re-discuss at next visit    Tobacco use  She was smoking at least a pack per day, now down to ~8/day.   -- smoking cessation encouraged     Normocytic anemia   --will check CBC today.     Health Care Maintenance:  Routine health maintenance discussed  Immunization History   Administered Date(s) Administered Comments    COVID-19 VAC,BIVALENT(27YR UP),PFIZER 05/03/2021      11/01/2021     COVID-19 VACCINE,MRNA(MODERNA)(PF) 06/29/2019 Historical - Not administered in Epic     07/27/2019 Historical - Not administered in Epic     04/07/2020     Covid-19 Vac, (33yr+) (Spikevax) Monovalent Xbb.1.5 Moder  05/03/2022     DTP 12/22/1962 Historical - Not administered in Epic     02/04/1963 Historical - Not administered in Epic     07/22/1963 Historical - Not administered in Epic    HEPATITIS B VACCINE ADULT,IM(ENERGIX B, RECOMBIVAX) 01/03/2015      03/13/2015     Hepatitis B Vaccine, Unspecified Formulation 08/19/1996 Historical - Not administered in Epic     02/24/1997 Historical - Not administered in Epic    INFLUENZA QUAD HIGH DOSE 26YRS+(FLUZONE) 03/03/2019     Influenza Vaccine Quad(IM)6 MO-Adult(PF) 02/10/2013      05/03/2014      03/13/2015      03/20/2016      07/12/2017      03/11/2018      02/14/2020      05/03/2021      02/01/2022     Influenza Virus Vaccine, unspecified formulation 02/10/2013      05/03/2014      03/13/2015  03/20/2016      07/12/2017      03/11/2018     MMR 12/07/1998 Historical - Not administered in Epic     12/20/1998 Historical - Not administered in Epic     04/04/1999 Historical - Not administered in Epic    Meningococcal Conjugate MCV4P 05/03/2021     OPV 07/19/1962 Historical - Not administered in Epic     09/13/1962 Historical - Not administered in Epic     12/22/1962 Historical - Not administered in Epic    PNEUMOCOCCAL POLYSACCHARIDE 23-VALENT 01/11/2015     Pneumococcal Conjugate 20-valent 11/01/2021     Pneumococcal conjugate -PCV7 02/18/2012     TD(TDVAX),ADSORBED,2LF(IM)(PF) 12/07/1998 Historical - Not administered in Epic    TdaP 01/01/2011    Deferred Date(s) Deferred Comments    Influenza Vaccine Quad(IM)6 MO-Adult(PF) 05/17/2019      06/24/2019 Pt remember she had a dose already last year.  See immunization record.       Diagnoses and all orders for this visit:  Sciatica of right side  - gabapentin (NEURONTIN) 300 MG capsule; 3 capsules po morning and midday, 4 capsules po qhs    Rheumatoid arthritis involving multiple sites with positive rheumatoid factor (CMS-HCC)  - lidocaine (XYLOCAINE) 10 mg/mL (1 %) injection 2 mL  - methylPREDNISolone acetate (DEPO-Medrol) injection 40 mg        I appreciate the opportunity to participate and collaborate in the care of this patient.      History of Present Illness:     Primary Care Provider: Leda Min, PA    HPI:  Bethany Meyer is a 67 y.o.  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    Today  Doing much better overall with restarting MTX and Ritux x1. Tolerating both as she did before. The only residual problem is her L wrist, which remains swollen and stiff. Has limited ROM in this joint. Otherwise doing well without acute complaints today. Has not been taking her fosamax, no particular reason.     As background  Bethany Meyer's joint pain first started around May/June 2016 with right knee pain and swelling, then right foot and ankle swelling. She is RF negative, CCP positive. She was started on methotrexate but had persistent joint pain and swelling so was started on Humira in March 2017. She is on methotrexate 20mg  weekly po (have been unable to get SQ approved by insurance).  She was increased to weekly Humira but continued to have joint pain and swelling.  Enbrel was not approved by insurance so prescription was sent for Bethany Meyer instead in 2018. Bethany Meyer was initially effective but the patient experienced several flares while on this medication. This was discontinued in May 2019 and she was started on Cimzia in June 2019. Cimzia was discontinued in summer 2020 due to lack of perceived benefit from medication.    Weight Change Past 6 Months in Pounds (rounded): -6 at 06/25/2022  9:39 AM    Balance of 10 systems was reviewed and is negative except for that mentioned in the HPI.    Review of records: I have reviewed labs/images/clinic notes per the computerized medical record.   X-ray: IMPRESSION:  -Subtle cortical irregularity at the second middle phalanx at the PIP joint, which is only visualized on oblique views and may be artifactual. Recommend correlation with point tenderness if fracture in this region is suspected.    Allergies:  Patient has no known allergies.    Medications:     Current Outpatient Medications:     acetaminophen (TYLENOL) 325 MG tablet, Take 1 tablet (325 mg total) by mouth two (2) times a day as needed for pain., Disp: , Rfl:     alendronate (FOSAMAX) 70 MG tablet, Take 1 tablet (70 mg total) by mouth every seven (7) days., Disp: 12 tablet, Rfl: 3    arm brace Misc, Boxer splint, metacarpal wrist brace, Disp: 1 each, Rfl: 0    aspirin 81 MG chewable tablet, CHEW 1 TABLET BY MOUTH DAILY, Disp: 36 tablet, Rfl: 3    buPROPion (WELLBUTRIN XL) 150 MG 24 hr tablet, Take 1 tablet (150 mg total) by mouth every morning., Disp: 90 tablet, Rfl: 2    dicyclomine (BENTYL) 20 mg tablet, Take 1 tablet (20 mg total) by mouth four times a day as needed (stomach cramps)., Disp: 360 tablet, Rfl: 3    empty container Misc, Use as directed, Disp: 1 each, Rfl: 2    estradioL (ESTRACE) 0.01 % (0.1 mg/gram) vaginal cream, Insert 2 g into the vagina Two (2) times a week. Insert 2 g inside vagina and apply a pea-sized amount around urethra twice weekly, Disp: , Rfl: 0    ferrous sulfate 325 (65 FE) MG EC tablet, Take 1 tablet (325 mg total) by mouth every other day., Disp: 45 tablet, Rfl: 3    folic acid (FOLVITE) 1 MG tablet, Take 1 tablet (1 mg total) by mouth daily., Disp: 90 tablet, Rfl: 3    haloperidoL (HALDOL) 0.5 MG tablet, Take 1 tablet (0.5 mg total) by mouth daily before breakfast., Disp: 90 tablet, Rfl: 3    leg brace (KNEE SUPPORT BRACE) Misc, 1 each by Miscellaneous route daily as needed., Disp: 1 each, Rfl: 0    methocarbamoL (ROBAXIN) 500 MG tablet, Take 1 tablet (500 mg total) by mouth Three times a day., Disp: 90 tablet, Rfl: 1    methotrexate, PF, 25 mg/0.5 mL [name], Inject the contents of 1 auto-injector (25 mg total) under the skin every seven (7) days., Disp: 2 mL, Rfl: 5    moxifloxacin (VIGAMOX) 0.5 % ophthalmic solution, Administer 1 drop to the right eye four (4) times a day., Disp: 3 mL, Rfl: 0    needle, disp, 25 gauge 25 gauge x 5/8 Ndle, For methotrexate injections, Disp: 100 each, Rfl: 0    nicotine (NICODERM CQ) 14 mg/24 hr patch, Place 1 patch on the skin daily., Disp: 28 patch, Rfl: 1    nicotine (NICODERM CQ) 14 mg/24 hr patch, Place 1 patch on the skin daily., Disp: 28 patch, Rfl: 0    prednisoLONE acetate (PRED FORTE) 1 % ophthalmic suspension, Administer 1 drop to the right eye four (4) times a day., Disp: 5 mL, Rfl: 0    predniSONE (DELTASONE) 10 MG tablet, Take 1 tablet (10 mg total) by mouth daily., Disp: 30 tablet, Rfl: 0    traMADol (ULTRAM) 50 mg tablet, Take 1 tablet (50 mg total) by mouth every six (6) hours as needed for pain., Disp: 30 tablet, Rfl: 0    traZODone (DESYREL) 100 MG tablet, 1/2 tablet for 3 nights then increase to 1 tablet before bed, Disp: 90 tablet, Rfl: 1    VITAMIN B-1 100 mg tablet, Take 1 tablet (100 mg total) by mouth daily., Disp: 100 tablet, Rfl: 3    gabapentin (NEURONTIN) 300 MG capsule, 3 capsules po morning and midday, 4 capsules po qhs,  Disp: 360 capsule, Rfl: 3    hydrOXYzine (ATARAX) 10 MG tablet, TAKE 1 TABLET (10 MG TOTAL) BY MOUTH 3 TIMES A DAY AS NEEDED FOR ITCHING, Disp: 270 tablet, Rfl: 1    Medical History:  Past Medical History:   Diagnosis Date    Arthritis     RA    Cataract     Functional dyspepsia     GERD (gastroesophageal reflux disease)     Gout     RA (rheumatoid arthritis) (CMS-HCC)     Smoking Stage 1 mild COPD by GOLD classification (CMS-HCC) 02/25/2017       Surgical History:  Past Surgical History:   Procedure Laterality Date    ovarian cyst removed      OVARY SURGERY      PR COLONOSCOPY FLX DX W/COLLJ SPEC WHEN PFRMD N/A 03/05/2013    Procedure: COLONOSCOPY, FLEXIBLE, PROXIMAL TO SPLENIC FLEXURE; DIAGNOSTIC, W/WO COLLECTION SPECIMEN BY BRUSH OR WASH;  Surgeon: Trudie Buckler, MD;  [facility]: [facility];  Service: Gastroenterology    PR COMPRE EP EVAL ABLTJ 3D MAPG TX SVT N/A 12/26/2015    Procedure: A-Flutter Ablation;  Surgeon: Theresia Majors, MD;  Location: [facility] EP;  Service: Cardiology    PR UP GI ENDOSCOPY,BALL DIL,30MM N/A 06/03/2019    Procedure: UGI ENDO; W/BALLOON DILAT ESOPHAGUS (<30MM DIAM);  Surgeon: Neysa Hotter, MD;  [facility];  Service: Gastroenterology    PR UPPER GI ENDOSCOPY,BIOPSY N/A 09/16/2012    Procedure: UGI ENDOSCOPY; WITH BIOPSY, SINGLE OR MULTIPLE;  Surgeon: Brown Human, MD;  [facility];  Service: Gastroenterology    PR UPPER GI ENDOSCOPY,BIOPSY N/A 06/03/2019    Procedure: UGI ENDOSCOPY; WITH BIOPSY, SINGLE OR MULTIPLE;  Surgeon: Neysa Hotter, MD;  [facility];  Service: Gastroenterology    PR UPPER GI ENDOSCOPY,DIAGNOSIS N/A 06/03/2019    Procedure: UGI ENDO, INCLUDE ESOPHAGUS, STOMACH, & DUODENUM &/OR JEJUNUM; DX W/WO COLLECTION SPECIMN, BY BRUSH OR WASH;  Surgeon: Neysa Hotter, MD;  [facility];  Service: Gastroenterology    PR XCAPSL CTRC RMVL INSJ IO LENS PROSTH CPLX WO ECP Right 05/29/2022    Procedure: EXTRACAPSULAR CATARACT REMOVAL WITH INSERTION OF INTRAOCULAR LENS PROSTHESIS, COMPLEX WITHOUT ENDOSCOPIC CYCLOPHOTOCOAGULATION;  Surgeon: Garner Gavel, MD;  [facility];  Service: Ophthalmology       Social History:  Social History     Tobacco Use    Smoking status: Every Day     Current packs/day: 1.00     Average packs/day: 1 pack/day for 35.0 years (35.0 ttl pk-yrs)     Types: Cigarettes    Smokeless tobacco: Never    Tobacco comments:     Started smoking at 33,    Vaping Use    Vaping Use: Some days   Substance Use Topics    Alcohol use: No     Alcohol/week: 0.0 standard drinks of alcohol    Drug use: Yes     Types: Marijuana     Comment: once a month       Family History:  Family History   Problem Relation Age of Onset    Diabetes Mother         type 2    Breast cancer Mother     Diabetes Father         type 2    Cataracts Father  Macular degeneration Neg Hx     Retinal detachment Neg Hx     Colon cancer Neg Hx     Endometrial cancer Neg Hx     Ovarian cancer Neg Hx     Glaucoma Neg Hx     Blindness Neg Hx            Objective   Vitals:    06/20/22 0904   BP: 122/78   BP Site: L Arm   BP Position: Sitting   BP Cuff Size: Small   Temp: 36.1 ??C (97 ??F)   TempSrc: Temporal   Weight: 57.2 kg (126 lb)       Physical Exam:  General: Well-appearing.  HEENT: Sclera anicteric;   Pulmonary: Unremarkable respiratory effort.   Neuro: Moving all four extremities spontaneously, able to walk to the exam table independently   Psych: Euthymic affect.  MSK:     Hands/Wrists: Swelling with bogginess/warmth of L wrist. Otherwise no bogginess/warmth of any hand joint. Hand grip weaker on L and than R.  Elbows: No swelling or tenderness to palpation of the elbows.  Shoulders: No swelling or tenderness to palpation of the bilateral shoulders.  Knees: No swelling or tenderness to palpation of the bilateral knees.  Ankles: No swelling or tenderness to palpation of the bilateral ankles.  Feet: Tenderness to palpation of the lateral hips.   Skin: No evidence of erythema or rash.  Extremities: Warm, no LE edema.    I personally spent 38 minutes face-to-face and non-face-to-face in the care of this patient, which includes all pre, intra, and post visit time on the date of service.  All documented time was specific to the E/M visit and does not include any procedures that may have been performed.

## 2022-06-20 NOTE — Unmapped (Signed)
Patient information following joint aspiration/injection    What is an aspiration and/or injection?    Aspiration and injection refer to the placement of a needle into a joint cavity or bursa to remove fluid for analysis or study, and to inject medicine for pain relief or treatment of a disease (rheumatoid arthritis, gout, pseudogout, infectious arthritis, others). Sometimes the doctor may only remove some of the joint fluid to establish a diagnosis (or cause) for the collection of fluid. In other cases, such as in patients with chronic osteoarthritis, the doctor may only inject medicine to provide pain relief.    Are there risks with needle aspiration and injection?    The risks of this procedure are few. Introducing infection into the joint is uncommon, occurring in less than 0.01 percent of patients. Rarely, the needle tip may damage the cartilage surface inside the joint. The greatest risk of injury to the joint appears to be associated with placing too much medicine in the joint or administering the medicine too often.    Is the procedure painful?    Most people find the procedure tolerable. However, the procedure can hurt if the needle touches bone or an inflamed tissue. Your doctor will try to avoid these surfaces, but sometimes this cannot be prevented. If you feel discomfort, it will generally be brief. This procedure is most commonly performed without an anesthetic (numbing medicine). Pain occurring hours after an injection into a joint can develop from the crystals of medicine that are inserted and may be prevented by taking an anti-inflammatory medicine such as ibuprofen (brand names: Advil, Motrin, Nuprin).  Icing the area for 10 minutes every few hours after the procedure may also be helpful.    If I had a large amount of fluid removed from the joint, can it come back?    Yes. A collection of fluid within the joint or bursa is called an effusion. It is common for an effusion to recur after removal of a large amount of fluid. Some experts recommend that an elastic (ACE) wrap be placed over the joint after removal of large amounts of fluid.    If medicine is injected, how long will it be effective?    The response to medicine injected into the joint is highly variable. Some people get lasting relief, whereas others may only notice improvement for days to weeks. Call your doctor if the pain returns quickly.        Following Joint Aspiration and Injection    *A bandage was placed over the needle insertion site. You can remove it at any time.    *One complication of a joint injection is postinjection flare. This is a rare inflammatory reaction to the medicine placed in the joint. The reaction produces intense pain, beginning about 6 to 12 hours after the injection. Patients usually complain that their pain is worse than it was before the injection was performed. To blunt or eliminate this reaction, take ibuprofen, three 200-mg tablets three times a day, for at least the first two days after the injection.    *If a large amount of fluid was removed from the joint, you may be asked to wear an elastic (ACE) wrap over the site. The wrap is applied to create support and pressure that may reduce the tendency for the fluid to come back. Do not wrap the ACE wrap so tightly that the extremity becomes numb or looks blue.    *If laboratory studies were performed on the joint fluid,   the results should come back in the next few days. Your doctor's office will contact you with the results. Please keep any scheduled follow-up appointments with your doctor.    *Infection in the joint is uncommon after joint aspiration and injection. If the joint becomes red, warm, or tender, or if you develop a fever in the first few days after the procedure, please call your doctor.    *If your joint was injected with medicine and local anesthetic, it may be numb for several hours after the procedure. Avoid heavy exercise or placing excessive strain on the injected joint (e.g., carrying children or heavy objects) for the first two weeks after the procedure. Jumping up and down, for instance, can place a heavy load on an anesthetized joint and should be avoided.    *Joint pain may slowly return after an injection. Some people receive long-term pain relief, but for most people the pain gradually returns. Continue your long-term medicine for the joint as prescribed by your doctor.    During regular hours, for any problems directly related to the injection, please call [phone] and ask for Dr. Nelson.  For other issues, please contact your primary rheumatologist.    After hours or on weekends, please call [phone] and ask to be connected to the  on-call rheumatologist.

## 2022-06-21 MED ORDER — HYDROXYZINE HCL 10 MG TABLET
ORAL_TABLET | 1 refills | 0 days | Status: CP
Start: 2022-06-21 — End: ?

## 2022-06-21 NOTE — Unmapped (Signed)
The [facility] has made a second and final attempt to reach this patient to refill the following medication:Rasuvo.      We have left voicemails on the following phone numbers: [phone], have sent a MyChart message, and have sent a Mychart questionnaire..    Dates contacted: 05/31/22 and 06/21/22  Last scheduled delivery: 05/08/23    The patient may be at risk of non-compliance with this medication. The patient should call the [facility] at [phone]  Option 4, then Option 2 (all other specialty patients) to refill medication.    Unk Lightning   [facility] Shared [facility] Specialty Technician

## 2022-06-25 NOTE — Unmapped (Signed)
PROCEDURE NOTE: After a time out and discussion of risks and benefits, including the option for continued conservative management, the patient gave verbal consent for a  left  wrist  injection. Risks discussed included infection (1:5000), pain, bleeding and damage to local structures. A time out was completed confirming patient identification, consent, and location of procedure. The  left  wrist  was identified and marked.  This area was prepped with betadine and ethyl chloride was used for topical anesthesia.   2 mL of 1% lidocaine along with  40 mg of depomedrol was injected into this site.  The patient tolerated the procedure well and there were no immediate complications. Patient advised on post-injection flare management and iatrogenic septic joint signs/symptoms requiring immediate medical attention.

## 2022-06-28 ENCOUNTER — Ambulatory Visit
Admit: 2022-06-28 | Discharge: 2022-06-29 | Payer: MEDICARE | Attending: Student in an Organized Health Care Education/Training Program | Primary: Student in an Organized Health Care Education/Training Program

## 2022-06-28 NOTE — Unmapped (Signed)
I discussed the findings, assessment, and plan with the resident and agree with the findings and plan as documented in the resident's note, but did not see the patient. I was immediately available via phone/pager or present on site to review the details of the case.     Reshanda Lewey MD

## 2022-06-28 NOTE — Unmapped (Signed)
#   Pseudophakia, OD  S/p OD CEIOL Nickole Adamek/Ranjan (05/29/2022)  - Complications: none  - Notable intraop findings: none    POM1:  - VA 20/25 +2 uncorrected  - Main incision water-tight  - Paracentesis water-tight  - Lens in the bag    POM1:  - Adherent to postoperative drops  - OCT today compact  - Doing well with normal post-operative appearance    Manifest Refraction       Manifest Refraction         Sphere Cylinder Axis Dist VA Add [place]    Right -0.75 +0.50 100 20/20-2 +2.50 20/20    Left                        Plan:  - Stop all post operative drops in the operative eye.  - MRx was provided today.  - surgery OS on 02/14 as planned     Ellender Hose, MD   PGY-3 [facility] Ophthalmology

## 2022-07-03 ENCOUNTER — Encounter
Admit: 2022-07-03 | Discharge: 2022-07-03 | Payer: MEDICARE | Attending: Certified Registered" | Primary: Certified Registered"

## 2022-07-03 ENCOUNTER — Ambulatory Visit: Admit: 2022-07-03 | Discharge: 2022-07-03 | Payer: MEDICARE

## 2022-07-03 MED ORDER — PREDNISOLONE ACETATE 1 % EYE DROPS,SUSPENSION
Freq: Four times a day (QID) | OPHTHALMIC | 0 refills | 50 days | Status: CP
Start: 2022-07-03 — End: ?
  Filled 2022-07-03: qty 10, 50d supply, fill #0

## 2022-07-03 MED ORDER — MOXIFLOXACIN 0.5 % EYE DROPS
Freq: Four times a day (QID) | OPHTHALMIC | 0 refills | 15 days | Status: CP
Start: 2022-07-03 — End: ?
  Filled 2022-07-03: qty 3, 15d supply, fill #0

## 2022-07-03 MED ADMIN — midazolam (VERSED) injection: INTRAVENOUS | @ 19:00:00 | Stop: 2022-07-03

## 2022-07-03 MED ADMIN — balanced salt irrigation solution (BSS) ophthalmic irrigation solution: OPHTHALMIC | @ 19:00:00 | Stop: 2022-07-03

## 2022-07-03 MED ADMIN — Buffered lidocaine 0.75%/EPINEPHrine 1:4000 (1 mg/mL) EPI-Shugarcaine 2 mL syringe: INTRACAMERAL | @ 19:00:00 | Stop: 2022-07-03

## 2022-07-03 MED ADMIN — fentaNYL (PF) (SUBLIMAZE) injection: INTRAVENOUS | @ 19:00:00 | Stop: 2022-07-03

## 2022-07-03 MED ADMIN — ondansetron (ZOFRAN) injection: INTRAVENOUS | @ 19:00:00 | Stop: 2022-07-03

## 2022-07-03 MED ADMIN — acetaminophen (TYLENOL) tablet 1,000 mg: 1000 mg | ORAL | @ 20:00:00 | Stop: 2022-07-03

## 2022-07-03 MED ADMIN — chondroitin-sodium hyaluronate (DUOVISC) 3 %-4 %(0.35mL) 1 % (0.4 mL) intraocular kit: INTRAOCULAR | @ 19:00:00 | Stop: 2022-07-03

## 2022-07-03 MED ADMIN — moxifloxacin (VIGAMOX) 0.5 % ophthalmic solution: TOPICAL | @ 19:00:00 | Stop: 2022-07-03

## 2022-07-03 MED ADMIN — phenylephrine (NEO-SYNEPHRINE) ophthalmic solution 1 drop: 1 [drp] | OPHTHALMIC | @ 18:00:00 | Stop: 2022-07-03

## 2022-07-03 MED ADMIN — sterile water irrigation solution: TOPICAL | @ 19:00:00 | Stop: 2022-07-03

## 2022-07-03 MED ADMIN — tropicamide (MYDRIACYL) 1 % ophthalmic solution 1 drop: 1 [drp] | OPHTHALMIC | @ 18:00:00 | Stop: 2022-07-03

## 2022-07-03 MED ADMIN — EPINEPHrine 0.3 mg in BSS irrigation: INTRAOCULAR | @ 19:00:00 | Stop: 2022-07-03

## 2022-07-03 MED ADMIN — tetracaine HCl (PF) (ALTACAINE) 0.5 %: TOPICAL | @ 19:00:00 | Stop: 2022-07-03

## 2022-07-03 MED ADMIN — trypan blue intraocular injection: INTRAOCULAR | @ 19:00:00 | Stop: 2022-07-03

## 2022-07-03 NOTE — Unmapped (Signed)
Ophthalmology Pre-Operative H&P      History of Present Illness:  67 y.o. female here today for cataract surgery, left eye.         Allergies:  Patient has no known allergies.    Medications:   No current facility-administered medications for this encounter.     Current Outpatient Medications   Medication Sig Dispense Refill    acetaminophen (TYLENOL) 325 MG tablet Take 1 tablet (325 mg total) by mouth two (2) times a day as needed for pain.      alendronate (FOSAMAX) 70 MG tablet Take 1 tablet (70 mg total) by mouth every seven (7) days. 12 tablet 3    arm brace Misc Boxer splint, metacarpal wrist brace 1 each 0    aspirin 81 MG chewable tablet CHEW 1 TABLET BY MOUTH DAILY 36 tablet 3    buPROPion (WELLBUTRIN XL) 150 MG 24 hr tablet Take 1 tablet (150 mg total) by mouth every morning. 90 tablet 2    dicyclomine (BENTYL) 20 mg tablet Take 1 tablet (20 mg total) by mouth four times a day as needed (stomach cramps). 360 tablet 3    empty container Misc Use as directed 1 each 2    estradioL (ESTRACE) 0.01 % (0.1 mg/gram) vaginal cream Insert 2 g into the vagina Two (2) times a week. Insert 2 g inside vagina and apply a pea-sized amount around urethra twice weekly  0    ferrous sulfate 325 (65 FE) MG EC tablet Take 1 tablet (325 mg total) by mouth every other day. 45 tablet 3    folic acid (FOLVITE) 1 MG tablet Take 1 tablet (1 mg total) by mouth daily. 90 tablet 3    gabapentin (NEURONTIN) 300 MG capsule 3 capsules po morning and midday, 4 capsules po qhs 360 capsule 3    haloperidoL (HALDOL) 0.5 MG tablet Take 1 tablet (0.5 mg total) by mouth daily before breakfast. 90 tablet 3    hydrOXYzine (ATARAX) 10 MG tablet TAKE 1 TABLET (10 MG TOTAL) BY MOUTH 3 TIMES A DAY AS NEEDED FOR ITCHING 270 tablet 1    leg brace (KNEE SUPPORT BRACE) Misc 1 each by Miscellaneous route daily as needed. 1 each 0    methocarbamoL (ROBAXIN) 500 MG tablet Take 1 tablet (500 mg total) by mouth Three times a day. 90 tablet 1    methotrexate, PF, 25 mg/0.5 mL [name] Inject the contents of 1 auto-injector (25 mg total) under the skin every seven (7) days. 2 mL 5    moxifloxacin (VIGAMOX) 0.5 % ophthalmic solution Administer 1 drop to the right eye four (4) times a day. 3 mL 0    needle, disp, 25 gauge 25 gauge x 5/8 Ndle For methotrexate injections 100 each 0    nicotine (NICODERM CQ) 14 mg/24 hr patch Place 1 patch on the skin daily. 28 patch 1    nicotine (NICODERM CQ) 14 mg/24 hr patch Place 1 patch on the skin daily. 28 patch 0    prednisoLONE acetate (PRED FORTE) 1 % ophthalmic suspension Administer 1 drop to the right eye four (4) times a day. 5 mL 0    predniSONE (DELTASONE) 10 MG tablet Take 1 tablet (10 mg total) by mouth daily. 30 tablet 0    traMADol (ULTRAM) 50 mg tablet Take 1 tablet (50 mg total) by mouth every six (6) hours as needed for pain. 30 tablet 0    traZODone (DESYREL) 100 MG tablet 1/2 tablet for 3  nights then increase to 1 tablet before bed 90 tablet 1    VITAMIN B-1 100 mg tablet Take 1 tablet (100 mg total) by mouth daily. 100 tablet 3     No medications prior to admission.       Past Medical History:   Diagnosis Date    Arthritis     RA    Cataract     Functional dyspepsia     GERD (gastroesophageal reflux disease)     Gout     RA (rheumatoid arthritis) (CMS-HCC)     Smoking     Stage 1 mild COPD by GOLD classification (CMS-HCC) 02/25/2017       Past Surgical History:   Procedure Laterality Date    ovarian cyst removed      OVARY SURGERY      PR COLONOSCOPY FLX DX W/COLLJ SPEC WHEN PFRMD N/A 03/05/2013    Procedure: COLONOSCOPY, FLEXIBLE, PROXIMAL TO SPLENIC FLEXURE; DIAGNOSTIC, W/WO COLLECTION SPECIMEN BY BRUSH OR WASH;  Surgeon: Trudie Buckler, MD;  Location: GI PROCEDURES [facility];  Service: Gastroenterology    PR COMPRE EP EVAL ABLTJ 3D MAPG TX SVT N/A 12/26/2015    Procedure: A-Flutter Ablation;  Surgeon: Theresia Majors, MD;  Location: [facility];  Service: Cardiology    PR UP GI ENDOSCOPY,BALL DIL,30MM N/A 06/03/2019    Procedure: UGI ENDO; W/BALLOON DILAT ESOPHAGUS (<30MM DIAM);  Surgeon: Neysa Hotter, MD;  [facility]: [facility];  Service: Gastroenterology    PR UPPER GI ENDOSCOPY,BIOPSY N/A 09/16/2012    Procedure: UGI ENDOSCOPY; WITH BIOPSY, SINGLE OR MULTIPLE;  Surgeon: Brown Human, MD;  [facility];  Service: Gastroenterology    PR UPPER GI ENDOSCOPY,BIOPSY N/A 06/03/2019    Procedure: UGI ENDOSCOPY; WITH BIOPSY, SINGLE OR MULTIPLE;  Surgeon: Neysa Hotter, MD;  Location: [facility] PROCEDURES [facility];  Service: Gastroenterology    PR UPPER GI ENDOSCOPY,DIAGNOSIS N/A 06/03/2019    Procedure: UGI ENDO, INCLUDE ESOPHAGUS, STOMACH, & DUODENUM &/OR JEJUNUM; DX W/WO COLLECTION SPECIMN, BY BRUSH OR WASH;  Surgeon: Neysa Hotter, MD;  Location: [facility] PROCEDURES [facility];  Service: Gastroenterology    PR XCAPSL CTRC RMVL INSJ IO LENS PROSTH CPLX WO ECP Right 05/29/2022    Procedure: EXTRACAPSULAR CATARACT REMOVAL WITH INSERTION OF INTRAOCULAR LENS PROSTHESIS, COMPLEX WITHOUT ENDOSCOPIC CYCLOPHOTOCOAGULATION;  Surgeon: Garner Gavel, MD;  [facility];  Service: Ophthalmology       Family History:  family history includes Breast cancer in her mother; Cataracts in her father; Diabetes in her father and mother.  There is no reported history of clotting disorders or anesthesia problems.    Social History:  Tobacco use:   reports that she has been smoking cigarettes. She has a 35 pack-year smoking history. She has never used smokeless tobacco.  Alcohol use:   reports no history of alcohol use.  Drug use:  reports current drug use. Drug: Marijuana.    Review of Systems:    10 point review of systems is negative other than HPI.    Objective:     Vital Signs:     No intake/output data recorded.    Physical Exam:  General: NAD.  Neuro/Psych: Alerted and Oriented x 3.  Ambulatory.  Psych: behavior wnl.  HEENT: normocephalic.  EYES: anicteric.  RESP: normal work of breathing.  CV: normal heart rate.  SKIN: warm and dry.    Please page the ophthalmology consults resident  or resident on call with questions.

## 2022-07-03 NOTE — Unmapped (Signed)
Preoperative diagnosis    1. Visually Significant Nuclear sclerotic cataract & cortical cataract, Left eye    Postoperative diagnosis    1. Same    Procedure    1. Simple Phacoemulsfication cataract extraction Left eye with implantation of DIB00 +15.0D    Attending: Garner Gavel, MD, MD    Resident surgeon: Andrez Grime, MD, MD    Anesthesia: MAC + Topical    Estimated blood loss: <1 cc    Specimens: None    Implants:   Implant Name Type Inv. Item Serial No. Manufacturer Lot No. LRB No. Used Action   Lens Tecnis Monofocal 15.0 - [id number] Intraocular Lens Lens Tecnis Monofocal 15.0 [id number] [employer]  Left 1 Implanted         Findings: Same as above    Complications: None    Procedure:     The patient was greeted in the preoperative holding area where again the risks, benefits, and alternatives to cataract surgery in the left eye was discussed with the patient. The consent was confirmed to be signed and witnessed in the chart. The left eye was marked as the operative eye and the patient was taken to the operating room. Timeout was conducted. The patient was then reclined to the supine position and prepped and draped in the usual sterile fashion for eye surgery. The operating microscope was moved into place. Timeout was then conducted.  The eye was fixated and a 1.15 mm slit knife was used to make a paracentesis. Air followed by trypan blue was instilled into the anterior chamber to better visualize the lens capsule. Balanced salt solution was used to irrigate out the trypan blue. Lidocaine followed by viscoelastic device was injected to fill the anterior chamber. A 2.4 mm keratome was used to make a clear corneal incision at the temporal limbus.  A disposable cystotome was used to puncture the anterior capsule and start a flap for a continuous curvilinear capsulorhexis. Capsulorhexis forceps were used to make a continuous curvilinear capsulorhexis. Gentle hydrodissection was performed with BSS. The phacoemulsfication handpiece along with a second instrument was used to remove nuclear and epinuclear material using a divide and conquer technique.  Cortical material was removed using the irrigation and aspiration handpiece.  Viscoelastic device was injected to fill the capsular bag. The lens was loaded and confirmed to be free of defects. The intraocular lens was injected and placed into the capsular bag.  The intraocular lens was centered. Viscoelastic was removed. The wounds were hydrated. The wound was checked for leak and no leak was found.  The lid speculum and drapes were removed. The surgical area was cleaned.  A drop of antibiotic was administered to the operative eye.  The patient's eye was shielded and they were taking to the post-anesthesia care unit in good condition. The patient will follow up tomorrow in the eye clinic.    Surgeon Notes: I performed all key portions of the procedure with supervision from Dr. Jhonnie Garner throughout the entirety of the case.    Ellender Hose, MD   [facility] Ophthalmology Department, PGY-3

## 2022-07-04 ENCOUNTER — Ambulatory Visit: Admit: 2022-07-04 | Discharge: 2022-07-04 | Payer: MEDICARE

## 2022-07-04 ENCOUNTER — Ambulatory Visit
Admit: 2022-07-04 | Discharge: 2022-07-04 | Payer: MEDICARE | Attending: Student in an Organized Health Care Education/Training Program | Primary: Student in an Organized Health Care Education/Training Program

## 2022-07-04 DIAGNOSIS — Z961 Presence of intraocular lens: Principal | ICD-10-CM

## 2022-07-04 DIAGNOSIS — Z9842 Cataract extraction status, left eye: Principal | ICD-10-CM

## 2022-07-04 NOTE — Unmapped (Signed)
ONE DAY  AFTER SURGERY  INSTRUCTIONS    RIGHT EYE                                                          LEFT EYE  PREDNISOLONE ACETATE 1% (PINK or WHITE TOP)   One drop 4 times per day       MOXIFLOXACIN 0.5% (TAN TOP)  One drop 4 times per day       INSTRUCTIONS  Wait 2-3 minutes between one drops and the next one.  Sleep with the hard shield taped over the eye for 1 week.  No heavy lifting or straining for 1 week.  DO NOT PRESS OR RUB ON THE EYE - you may gently clean around the eye.  Reading, using computers, or watching TV will not harm the eye.  Report any of the following immediately:  SEVERE PAIN  DECREASED VISION  INCREASED REDNESS OR SWELLING  INJURY TO THE EYE      RESUME DROPS YOU WERE TAKING BEFORE SURGERY.                   ARTIFICIAL TEARS MAY BE USED but not at the same time of the above drops.

## 2022-07-04 NOTE — Unmapped (Signed)
I discussed the findings, assessment, and plan with the resident and agree with the findings and plan as documented in the resident's note, but did not see the patient. I was immediately available via phone/pager or present on site to review the details of the case.     Steffan Caniglia MD

## 2022-07-04 NOTE — Unmapped (Signed)
Ophthalmology: No follow up call required as patient seen in surgeons office for follow up today.

## 2022-07-04 NOTE — Unmapped (Signed)
#   Pseudophakia, OS  S/p OS CEIOL Bethany Meyer/Ranjan (07/03/2022)  - Complications: none  - Notable intraop findings: none    POD1:  - 1+ cell, VA 20/30+  - Main incision water-tight, Seidel negative  - Paracentesis water-tight  - Lens in the bag    Plan:  - Printed postoperative instructions and precautions reviewed.  - Shield at bedtime, glasses during the day.   - Continue Moxifloxacin QID x 1 weeks  - Start Pred Forte QID    RTC in 1 week in cataract parallel   - V/T/autoMRx    Ellender Hose, MD   [facility] Ophthalmology Department, PGY-3

## 2022-07-08 NOTE — Unmapped (Signed)
[facility] RHEUMATOLOGY CLINIC - PHARMACIST NOTES    [facility] sent message that they have been unable to reach patient about refill of Rasuvo. Unable to reach patient, VM box was full. Called patient's daughter, Delorise Jackson, and gave [facility] contact information, she will pass along information to patient.    All questions were answered and contact information provided for any future questions/concerns.      Waverly Ferrari  PharmD Candidate [address] [employer] Ambulatory Care Intern

## 2022-07-09 NOTE — Unmapped (Signed)
[facility] Refill Coordination Note    Specialty Medication(s) to be Shipped:   Inflammatory Disorders: Rasuvo    Other medication(s) to be shipped: No additional medications requested for fill at this time     Bethany Meyer, DOB: 02/17/1956  Phone: [phone] (home)       All above HIPAA information was verified with patient.     Was a Nurse, learning disability used for this call? No    Completed refill call assessment today to schedule patient's medication shipment from the [facility] [phone]).  All relevant notes have been reviewed.     Specialty medication(s) and dose(s) confirmed: Regimen is correct and unchanged.   Changes to medications: [place] reports no changes at this time.  Changes to insurance: No  New side effects reported not previously addressed with a pharmacist or physician: None reported  Questions for the pharmacist: No    Confirmed patient received a Conservation officer, historic buildings and a Surveyor, mining with first shipment. The patient will receive a drug information handout for each medication shipped and additional FDA Medication Guides as required.       DISEASE/MEDICATION-SPECIFIC INFORMATION        For patients on injectable medications: Patient currently has 0 doses left.  Next injection is scheduled for 07/10/22.    SPECIALTY MEDICATION ADHERENCE     Medication Adherence    Patient reported X missed doses in the last month: 0  Specialty Medication: Rasuvo 25mg /0.63ml  Patient is on additional specialty medications: No              Were doses missed due to medication being on hold? No    Rasuvo 25mg /0.44ml    : 0 days of medicine on hand     REFERRAL TO PHARMACIST     Referral to the pharmacist: Not needed      [facility]     Shipping address confirmed in Epic.     Patient was notified of new phone menu : Yes    Delivery Scheduled: Yes, Expected medication delivery date: 07/10/22.     Medication will be delivered via UPS to the prescription address in Epic WAM.    Tera Helper, [facility]   [facility] Shared [facility] Specialty Pharmacist

## 2022-07-10 MED FILL — RASUVO (PF) 25 MG/0.5 ML SUBCUTANEOUS AUTO-INJECTOR: SUBCUTANEOUS | 28 days supply | Qty: 2 | Fill #3

## 2022-07-11 ENCOUNTER — Ambulatory Visit
Admit: 2022-07-11 | Discharge: 2022-07-12 | Payer: MEDICARE | Attending: Student in an Organized Health Care Education/Training Program | Primary: Student in an Organized Health Care Education/Training Program

## 2022-07-11 DIAGNOSIS — Z961 Presence of intraocular lens: Principal | ICD-10-CM

## 2022-07-11 DIAGNOSIS — Z9842 Cataract extraction status, left eye: Principal | ICD-10-CM

## 2022-07-11 NOTE — Unmapped (Signed)
#   Pseudophakia, OS  S/p OS CEIOL Damarie Schoolfield/Ranjan (07/03/2022)  - Complications: none  - Notable intraop findings: none    POW1:  - clear cornea, VA 20/15  - Main incision water-tight  - Paracentesis water-tight  - Lens in the bag    Manifest Refraction       Manifest Refraction         Sphere Cylinder Dist VA    Right       Left [place] Sphere 20/15                  Plan:  - Printed postoperative instructions and precautions reviewed.  - Stop Moxifloxacin QID  - Start Pred Forte taper (3-2-1 decrease weekly)    RTC in 3 weeks in cataract parallel   - V/T/D/Mrx    Ellender Hose, MD   [facility] Ophthalmology Department, PGY-3

## 2022-07-11 NOTE — Unmapped (Signed)
ONE  WEEK       AFTER SURGERY  INSTRUCTIONS  RIGHT EYE                                                          LEFT EYE  PREDNISOLONE ACETATE 1% (PINK or WHITE TOP)     3 times a day for 1 week, then 2 times a day for 1 week, then daily for 1 week, then STOP                STOP    MOXIFLOXACIN 0.5% (TAN TOP)       INSTRUCTIONS  NO RESTRICTIONS   Report any of the following immediately:  SEVERE PAIN  DECREASED VISION  INCREASED REDNESS OR SWELLING  INJURY TO THE EYE      RESUME DROPS YOU WERE TAKING BEFORE SURGERY.                   ARTIFICIAL TEARS MAY BE USED but not at the same time of the above drops.

## 2022-07-12 NOTE — Unmapped (Signed)
I discussed the findings, assessment, and plan with the resident and agree with the findings and plan as documented in the resident's note, but did not see the patient. I was immediately available via phone/pager or present on site to review the details of the case.     Jeri Lager MD

## 2022-07-28 DIAGNOSIS — D849 Immunodeficiency, unspecified: Principal | ICD-10-CM

## 2022-07-28 DIAGNOSIS — M069 Rheumatoid arthritis, unspecified: Principal | ICD-10-CM

## 2022-07-28 DIAGNOSIS — G5602 Carpal tunnel syndrome, left upper limb: Principal | ICD-10-CM

## 2022-07-28 DIAGNOSIS — Z79631 Methotrexate, long term, current use: Principal | ICD-10-CM

## 2022-07-28 DIAGNOSIS — M059 Rheumatoid arthritis with rheumatoid factor, unspecified: Principal | ICD-10-CM

## 2022-07-28 DIAGNOSIS — M0579 Rheumatoid arthritis with rheumatoid factor of multiple sites without organ or systems involvement: Principal | ICD-10-CM

## 2022-07-28 MED ORDER — PREDNISONE 10 MG TABLET
ORAL_TABLET | Freq: Every day | ORAL | 0 refills | 0 days
Start: 2022-07-28 — End: ?

## 2022-07-28 MED ORDER — PREDNISONE 5 MG TABLET
ORAL_TABLET | ORAL | 0 refills | 0 days
Start: 2022-07-28 — End: ?

## 2022-07-28 MED ORDER — METHOCARBAMOL 500 MG TABLET
ORAL_TABLET | Freq: Three times a day (TID) | ORAL | 1 refills | 0 days
Start: 2022-07-28 — End: ?

## 2022-07-29 MED ORDER — PREDNISONE 5 MG TABLET
ORAL_TABLET | ORAL | 0 refills | 29 days
Start: 2022-07-29 — End: 2022-08-26

## 2022-07-30 MED ORDER — METHOCARBAMOL 500 MG TABLET
ORAL_TABLET | Freq: Three times a day (TID) | ORAL | 1 refills | 30 days | Status: CP
Start: 2022-07-30 — End: ?

## 2022-07-30 MED ORDER — PREDNISONE 10 MG TABLET
ORAL_TABLET | Freq: Every day | ORAL | 0 refills | 30 days
Start: 2022-07-30 — End: ?

## 2022-08-05 ENCOUNTER — Ambulatory Visit
Admit: 2022-08-05 | Payer: MEDICARE | Attending: Student in an Organized Health Care Education/Training Program | Primary: Student in an Organized Health Care Education/Training Program

## 2022-08-13 NOTE — Unmapped (Signed)
[facility] Refill Coordination Note    Specialty Medication(s) to be Shipped:   Inflammatory Disorders: Rasuvo    Other medication(s) to be shipped: No additional medications requested for fill at this time     Bethany Meyer, DOB: [birth year]  Phone: [phone] (home)       All above HIPAA information was verified with patient.     Was a Nurse, learning disability used for this call? No    Completed refill call assessment today to schedule patient's medication shipment from the [facility] [phone]).  All relevant notes have been reviewed.     Specialty medication(s) and dose(s) confirmed: Regimen is correct and unchanged.   Changes to medications: Sarea reports no changes at this time.  Changes to insurance: No  New side effects reported not previously addressed with a pharmacist or physician: None reported  Questions for the pharmacist: No    Confirmed patient received a Conservation officer, historic buildings and a Surveyor, mining with first shipment. The patient will receive a drug information handout for each medication shipped and additional FDA Medication Guides as required.       DISEASE/MEDICATION-SPECIFIC INFORMATION        For patients on injectable medications: Patient currently has 0 doses left.  Next injection is scheduled for 08/20/2022.    SPECIALTY MEDICATION ADHERENCE     Medication Adherence    Patient reported X missed doses in the last month: 0  Specialty Medication: RASUVO (PF) 25 mg/0.5 mL [name] (methotrexate (PF))  Patient is on additional specialty medications: No  Patient is on more than two specialty medications: No  Any gaps in refill history greater than 2 weeks in the last 3 months: no  Demonstrates understanding of importance of adherence: yes  Informant: patient  Confirmed plan for next specialty medication refill: delivery by pharmacy  Refills needed for supportive medications: not needed          Refill Coordination    Has the Patients' Contact Information Changed: No  Is the Shipping Address Different: No         Were doses missed due to medication being on hold? No    RASUVO (PF) 25 /0.5  mg/ml: 0 days of medicine on hand       REFERRAL TO PHARMACIST     Referral to the pharmacist: Not needed      [facility]     Shipping address confirmed in Epic.     Patient was notified of new phone menu : No    Delivery Scheduled: Yes, Expected medication delivery date: 08/16/2022.     Medication will be delivered via UPS to the prescription address in Epic WAM.    Kerby Less   [facility] Specialty Technician

## 2022-08-15 MED FILL — RASUVO (PF) 25 MG/0.5 ML SUBCUTANEOUS AUTO-INJECTOR: SUBCUTANEOUS | 28 days supply | Qty: 2 | Fill #4

## 2022-08-20 DIAGNOSIS — R1084 Generalized abdominal pain: Principal | ICD-10-CM

## 2022-08-20 MED ORDER — TRAZODONE 100 MG TABLET
ORAL_TABLET | 1 refills | 0 days | Status: CP
Start: 2022-08-20 — End: 2023-04-24

## 2022-08-20 MED ORDER — DICYCLOMINE 20 MG TABLET
ORAL_TABLET | Freq: Four times a day (QID) | ORAL | 3 refills | 90 days | Status: CP | PRN
Start: 2022-08-20 — End: 2023-08-20
  Filled 2022-08-28: qty 360, 90d supply, fill #0

## 2022-08-25 DIAGNOSIS — M0579 Rheumatoid arthritis with rheumatoid factor of multiple sites without organ or systems involvement: Principal | ICD-10-CM

## 2022-09-12 NOTE — Unmapped (Signed)
Bethany Meyer has been contacted in regards to their refill of RASUVO (PF) 25 mg/0.5 mL [name] (methotrexate (PF)). At this time, they have declined refill due to  says call back in a few weeks . Refill assessment call date has been updated per the patient's request.

## 2022-09-25 ENCOUNTER — Ambulatory Visit: Admit: 2022-09-25 | Discharge: 2022-09-26 | Payer: MEDICARE

## 2022-09-25 DIAGNOSIS — H59099 Other disorders of unspecified eye following cataract surgery: Principal | ICD-10-CM

## 2022-09-25 NOTE — Unmapped (Signed)
[facility] REQUEST AND CONSENT FOR PROCEDURE    Vernie Ammons  24-Jan-1956  [id number]    I authorize Dr. Margret Chance, MD, M.D. and/or associates and assistants of his/her choice at  [facility] (referred to herein as ???facility???) to perform the following procedure(s):    YAG capsulotomy, left eye    I understand that surgical assistants and/or residents may perform selected tasks under the supervision of my attending surgeon(s). These tasks may include (if applicable): opening and closing a surgical site; dissecting tissue; removing tissue, blood or body fluids; injecting medications; harvesting grafts; transplanting tissue; administering anesthesia; implanting devices; inserting/removing/operating an endoscope for diagnosis or treatment; and placing invasive lines.     At the time of the procedure(s), the attending physician will determine the extent of participation by the surgical assistants and/or residents depending on: (1) the  complexity of the procedure; (2) my unique circumstances as the patient; and (3) the surgical assistants??? or residents??? training and experience.    2. I request that necessary and appropriate anesthesia and medications be given to me.    3. I understand that, during the procedure(s), it is possible for something unexpected to happen that may require another or different procedure(s) be performed on me. In that situation, I authorize my above-named health care providers or providers identified  as necessary by my surgical team to do what is medically necessary and appropriate for me.    4. I have discussed with my health care provider the following issues, as appropriate to my care [initial one]:    ______ Authorization for Blood Products: I authorize medically necessary blood and blood products be given to me  before, during or after the procedure(s), as determined by my heath care provider;  _____ Refusal to Authorize Blood Products: I do NOT authorize blood or blood products be given to me. (The patient or his/her guardian MUST also complete the facility???s form for refusal of blood or blood products.)    5. I have had an opportunity to ask questions, have had those questions answered, and have received sufficient information so that I have a general understanding of:  a. my medical condition,  b. the nature and benefits of the procedure(s),  c. the usual and most frequent risks of the procedure(s),  d. the risks and benefits of the alternative treatment(s), and  e. the prognosis of my condition with and without the procedure(s).  6. I am aware that the practice of medicine (including surgery) is not an exact science, and no one has made any guarantees  about the results of my procedure(s).    7. I understand the procedure(s) may result in the use of a human tissue implant, non-human implant or collagen received from a facility registered with the [place] Food and Drug Administration. Risks with implanted tissue include infection from bacteria or viruses which include but may not be limited to HIV and/or the hepatitis viruses.    8. I give permission for employees, agents, or independent contractors of the facility to do the following, as long as any action they take is consistent with policies and laws that protect my rights:  a) take photographs or make videos or drawings of me for permissible treatment, payment, or health care operations  purposes (which may include quality assessment, education, and training), and to use or disclose such photographs,  videos or drawings consistent with these purposes;  b) examine and dispose of any tissue, blood, or body parts that may be  removed during the procedure(s), or use such tissue,  blood, or body parts removed during the procedure(s) for education or research; and  c) for the purposes of advancing healthcare education, I give consent for observers authorized by the facility to be present  during the procedure(s).    9. For women of childbearing age: I understand there may be the potential need for diagnostic x-ray(s) during my procedure(s). Exposure to x-ray may cause serious injury to an unborn fetus. Large x-ray exposures to an unborn fetus have been  known to cause birth defects and abortion. Doses from diagnostic x-rays are not considered ???large,??? but there is a potential risk of  serious injury to an unborn fetus. I understand and have no further questions.    10. Based on my discussion with my health care provider and the information that I have received, I give my consent to the procedure(s). I confirm that I have read this form, or that it was read to me, that all blank spaces were filled in as  appropriate, and all sections that I do not agree with were crossed out and initialed before I signed below.    ______________________________________________________________________  Signature of Patient (or person authorized to sign for patient)   Date: 09/25/2022   Time: 2:01 PM     ______________________________________________________________________  Relationship to Patient (if applicable)    WITNESS CERTIFICATION    The patient (or person authorized to sign for the patient) has answered yes to all of the following questions:  a) Did a health care provider explain the procedure(s) to you?  b) Did a health care provider explain that selected tasks may be performed by assistant(s)/resident(s)?  c) Did a health care provider explain alternative procedures and treatments and their risks and benefits?  d) Have you given your consent for the procedure(s)?  e) Have all of your questions about the procedure(s) been answered?      ___________________________________ _______________________________   Witness Signature      ___________________________________________________________________  Printed name    Date: 09/25/2022   Time: 2:01 PM

## 2022-09-25 NOTE — Unmapped (Signed)
#   Pseudophakia, OU  # Capsular phimosis, OS  S/p OD CEIOL Ford/Ranjan (05/29/2022)  S/p OS CEIOL Ford/Ranjan (07/03/2022)  - Complications: none  - Notable intraop findings: none  - Significant anterior capsule phimosis OS in visual axis  - Exam s/f capsular phimosis OS in visual axis  - Yag performed today (see separate procedure note)   - Start PF QID OS   - RTC 2 weeks v/t     ________________________    Margret Chance, MD  PGY-2, Department of Ophthalmology  [facility]  [address], [place], [place] [address]  [phone]    Seen with Dr. Georgeann Oppenheim.

## 2022-09-26 NOTE — Unmapped (Signed)
[facility] Refill Coordination Note    Specialty Lite Medication(s) to be Shipped:   Inflammatory Disorders: Rasuvo 25mg /0.67ml    Other medication(s) to be shipped: No additional medications requested for fill at this time     Bethany Meyer, DOB: 07/15/1955  Phone: [phone] (home)       All above HIPAA information was verified with patient.     Was a Nurse, learning disability used for this call? No    Changes to medications: Elisama reports no changes at this time.  Changes to insurance: No      REFERRAL TO PHARMACIST     Referral to the pharmacist: Not needed      [facility]     Shipping address confirmed in Epic.     Delivery Scheduled: Yes, Expected medication delivery date: 10/01/22.     Medication will be delivered via UPS to the prescription address in Epic WAM.    Jasper Loser   [facility] Specialty Technician

## 2022-09-27 ENCOUNTER — Ambulatory Visit
Admit: 2022-09-27 | Payer: MEDICARE | Attending: Student in an Organized Health Care Education/Training Program | Primary: Student in an Organized Health Care Education/Training Program

## 2022-09-27 NOTE — Unmapped (Unsigned)
Assessment/Plan:   Bethany Meyer is a 67 y.o. 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    Seropositive (+CCP) non erosive RA   Active today but really only limited to L wrist, CDAI technically 15 but other joints are well controlled with restarting MTX and 1g of Ritux (04/2022, was supposed to get 2nd but cancelled). No significant fluid pocket by POC [place] today to aspirate, we agreed to proceed with CS injection and I have asked her to schedule her 2nd Ritux infusion.  - Continue MTX 25mg  SQ  - Continue FA daily  - Ritux 2nd 1g infusion (1st done 04/2022) remains to start Ritux, will re-explore this  - CBC w/ diff, CMP, ESR, CRP already ordered, will collect this today  - Corticosteroid injection to L wrist (see separate procedure note)                   CDAI interpretation:  0.0-2.8 Remission   2.9-10.0 Low disease activity   10.1-22.0 Moderate disease activity   22.1-76.0 High disease activity         Spinal stenosis  Patient with an MRI from 12/24/19 demonstrating severe spinal canal stenosis and foraminal narrowing at L4-5. Now with dramatic improvement after spinal surgery.     Osteoporosis - Fall resulting in right proximal femur greater trochanteric fracture  Dexa 02/2021 with left hip T score -2.7 c/w osteoporosis. Started Fosamax but has self-d/c'd multiple times. Has not taken for a significant continuous period. We discussed importance of going back on therapy but in light of focusing on uncontrolled RA we did not restart Fosamax today.  - Re-discuss at next visit    Tobacco use  She was smoking at least a pack per day, now down to ~8/day.   -- smoking cessation encouraged     Normocytic anemia   --will check CBC today.     Health Care Maintenance:  Routine health maintenance discussed  Immunization History   Administered Date(s) Administered Comments    COVID-19 VAC,BIVALENT(51YR UP),PFIZER 05/03/2021      11/01/2021     COVID-19 VACCINE,MRNA(MODERNA)(PF) 06/29/2019 Historical - Not administered in Epic     07/27/2019 Historical - Not administered in Epic     04/07/2020     Covid-19 Vac, (51yr+) (Spikevax) Monovalent Xbb.1.5 Moder  05/03/2022     DTP 12/22/1962 Historical - Not administered in Epic     02/04/1963 Historical - Not administered in Epic     07/22/1963 Historical - Not administered in Epic    HEPATITIS B VACCINE ADULT,IM(ENERGIX B, RECOMBIVAX) 01/03/2015      03/13/2015     Hepatitis B Vaccine, Unspecified Formulation 08/19/1996 Historical - Not administered in Epic     02/24/1997 Historical - Not administered in Epic    INFLUENZA QUAD HIGH DOSE 59YRS+(FLUZONE) 03/03/2019     Influenza Vaccine Quad(IM)6 MO-Adult(PF) 02/10/2013      05/03/2014      03/13/2015      03/20/2016      07/12/2017      03/11/2018      02/14/2020      05/03/2021      02/01/2022     Influenza Virus Vaccine, unspecified formulation 02/10/2013      05/03/2014      03/13/2015      03/20/2016      07/12/2017      03/11/2018  MMR 12/07/1998 Historical - Not administered in Epic     12/20/1998 Historical - Not administered in Epic     04/04/1999 Historical - Not administered in Epic    Meningococcal Conjugate MCV4P 05/03/2021     OPV 07/19/1962 Historical - Not administered in Epic     09/13/1962 Historical - Not administered in Epic     12/22/1962 Historical - Not administered in Epic    PNEUMOCOCCAL POLYSACCHARIDE 23-VALENT 01/11/2015     Pneumococcal Conjugate 20-valent 11/01/2021     Pneumococcal conjugate -PCV7 02/18/2012     TD(TDVAX),ADSORBED,2LF(IM)(PF) 12/07/1998 Historical - Not administered in Epic    TdaP 01/01/2011    Deferred Date(s) Deferred Comments    Influenza Vaccine Quad(IM)6 MO-Adult(PF) 05/17/2019      06/24/2019 Pt remember she had a dose already last year.  See immunization record.       Diagnoses and all orders for this visit:  There are no diagnoses linked to this encounter.        I appreciate the opportunity to participate and collaborate in the care of this patient.      History of Present Illness:     Primary Care Provider: Leda Min, PA    HPI:  Bethany Meyer is a 67 y.o.  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    Today  Doing much better overall with restarting MTX and Ritux x1. Tolerating both as she did before. The only residual problem is her L wrist, which remains swollen and stiff. Has limited ROM in this joint. Otherwise doing well without acute complaints today. Has not been taking her fosamax, no particular reason.     As background  Bethany Meyer's joint pain first started around May/June 2016 with right knee pain and swelling, then right foot and ankle swelling. She is RF negative, CCP positive. She was started on methotrexate but had persistent joint pain and swelling so was started on Humira in March 2017. She is on methotrexate 20mg  weekly po (have been unable to get SQ approved by insurance).  She was increased to weekly Humira but continued to have joint pain and swelling.  Enbrel was not approved by insurance so prescription was sent for [name] instead in 2018. [name] was initially effective but the patient experienced several flares while on this medication. This was discontinued in May 2019 and she was started on Cimzia in June 2019. Cimzia was discontinued in summer 2020 due to lack of perceived benefit from medication.    Weight Change Past 6 Months in Pounds (rounded): 0 at 09/27/2022  8:19 AM    Balance of 10 systems was reviewed and is negative except for that mentioned in the HPI.    Review of records: I have reviewed labs/images/clinic notes per the computerized medical record.   X-ray: IMPRESSION:  -Subtle cortical irregularity at the second middle phalanx at the PIP joint, which is only visualized on oblique views and may be artifactual. Recommend correlation with point tenderness if fracture in this region is suspected.    Allergies:  Patient has no known allergies.    Medications:     Current Outpatient Medications:     acetaminophen (TYLENOL) 325 MG tablet, Take 1 tablet (325 mg total) by mouth two (2) times a day as needed for pain., Disp: , Rfl:     alendronate (FOSAMAX) 70 MG tablet, Take 1 tablet (70 mg total) by  mouth every seven (7) days., Disp: 12 tablet, Rfl: 3    arm brace Misc, Boxer splint, metacarpal wrist brace, Disp: 1 each, Rfl: 0    aspirin 81 MG chewable tablet, CHEW 1 TABLET BY MOUTH DAILY, Disp: 36 tablet, Rfl: 3    buPROPion (WELLBUTRIN XL) 150 MG 24 hr tablet, Take 1 tablet (150 mg total) by mouth every morning., Disp: 90 tablet, Rfl: 2    dicyclomine (BENTYL) 20 mg tablet, Take 1 tablet (20 mg total) by mouth four times a day as needed (stomach cramps)., Disp: 360 tablet, Rfl: 3    empty container Misc, Use as directed, Disp: 1 each, Rfl: 2    estradioL (ESTRACE) 0.01 % (0.1 mg/gram) vaginal cream, Insert 2 g into the vagina Two (2) times a week. Insert 2 g inside vagina and apply a pea-sized amount around urethra twice weekly, Disp: , Rfl: 0    ferrous sulfate 325 (65 FE) MG EC tablet, Take 1 tablet (325 mg total) by mouth every other day., Disp: 45 tablet, Rfl: 3    folic acid (FOLVITE) 1 MG tablet, Take 1 tablet (1 mg total) by mouth daily., Disp: 90 tablet, Rfl: 3    gabapentin (NEURONTIN) 300 MG capsule, 3 capsules po morning and midday, 4 capsules po qhs, Disp: 360 capsule, Rfl: 3    haloperidoL (HALDOL) 0.5 MG tablet, Take 1 tablet (0.5 mg total) by mouth daily before breakfast., Disp: 90 tablet, Rfl: 3    hydrOXYzine (ATARAX) 10 MG tablet, TAKE 1 TABLET (10 MG TOTAL) BY MOUTH 3 TIMES A DAY AS NEEDED FOR ITCHING, Disp: 270 tablet, Rfl: 1    leg brace (KNEE SUPPORT BRACE) Misc, 1 each by Miscellaneous route daily as needed., Disp: 1 each, Rfl: 0    methocarbamol (ROBAXIN) 500 MG tablet, TAKE 1 TABLET (500 MG TOTAL) BY MOUTH THREE TIMES A DAY., Disp: 90 tablet, Rfl: 1    methotrexate, PF, 25 mg/0.5 mL [name], Inject the contents of 1 auto-injector (25 mg total) under the skin every seven (7) days., Disp: 2 mL, Rfl: 5    moxifloxacin (VIGAMOX) 0.5 % ophthalmic solution, Administer 1 drop to the right eye four (4) times a day., Disp: 3 mL, Rfl: 0    moxifloxacin (VIGAMOX) 0.5 % ophthalmic solution, Administer 1 drop into the left eye four (4) times a day., Disp: 3 mL, Rfl: 0    needle, disp, 25 gauge 25 gauge x 5/8 Ndle, For methotrexate injections, Disp: 100 each, Rfl: 0    nicotine (NICODERM CQ) 14 mg/24 hr patch, Place 1 patch on the skin daily., Disp: 28 patch, Rfl: 1    nicotine (NICODERM CQ) 14 mg/24 hr patch, Place 1 patch on the skin daily., Disp: 28 patch, Rfl: 0    prednisoLONE acetate (PRED FORTE) 1 % ophthalmic suspension, Administer 1 drop to the right eye four (4) times a day., Disp: 5 mL, Rfl: 0    prednisoLONE acetate (PRED FORTE) 1 % ophthalmic suspension, Administer 1 drop into the left eye four (4) times a day., Disp: 10 mL, Rfl: 0    predniSONE (DELTASONE) 10 MG tablet, Take 1 tablet (10 mg total) by mouth daily., Disp: 30 tablet, Rfl: 0    traMADol (ULTRAM) 50 mg tablet, Take 1 tablet (50 mg total) by mouth every six (6) hours as needed for pain., Disp: 30 tablet, Rfl: 0    traZODone (DESYREL) 100 MG tablet, 1/2 tablet for 3 nights then increase to 1 tablet before bed,  Disp: 90 tablet, Rfl: 1    VITAMIN B-1 100 mg tablet, Take 1 tablet (100 mg total) by mouth daily., Disp: 100 tablet, Rfl: 3    Medical History:  Past Medical History:   Diagnosis Date    Arthritis     RA    Cataract     Functional dyspepsia     GERD (gastroesophageal reflux disease)     Gout     RA (rheumatoid arthritis) (CMS-HCC)     Smoking     Stage 1 mild COPD by GOLD classification (CMS-HCC) 02/25/2017       Surgical History:  Past Surgical History:   Procedure Laterality Date    ovarian cyst removed OVARY SURGERY      PR COLONOSCOPY FLX DX W/COLLJ SPEC WHEN PFRMD N/A 03/05/2013    Procedure: COLONOSCOPY, FLEXIBLE, PROXIMAL TO SPLENIC FLEXURE; DIAGNOSTIC, W/WO COLLECTION SPECIMEN BY BRUSH OR WASH;  Surgeon: Trudie Buckler, MD;  Location: GI PROCEDURES [facility];  Service: Gastroenterology    PR COMPRE EP EVAL ABLTJ 3D MAPG TX SVT N/A 12/26/2015    Procedure: A-Flutter Ablation;  Surgeon: Theresia Majors, MD;  Location: [facility] EP;  Service: Cardiology    PR UP GI ENDOSCOPY,BALL DIL,30MM N/A 06/03/2019    Procedure: UGI ENDO; W/BALLOON DILAT ESOPHAGUS (<30MM DIAM);  Surgeon: Neysa Hotter, MD;  Location: [facility];  Service: Gastroenterology    PR UPPER GI ENDOSCOPY,BIOPSY N/A 09/16/2012    Procedure: UGI ENDOSCOPY; WITH BIOPSY, SINGLE OR MULTIPLE;  Surgeon: Brown Human, MD;  [facility];  Service: Gastroenterology    PR UPPER GI ENDOSCOPY,BIOPSY N/A 06/03/2019    Procedure: UGI ENDOSCOPY; WITH BIOPSY, SINGLE OR MULTIPLE;  Surgeon: Neysa Hotter, MD;  Location: [facility] PROC[facility]  Service: Gastroenterology    PR UPPER GI ENDOSCOPY,DIAGNOSIS N/A 06/03/2019    Procedure: UGI ENDO, INCLUDE ESOPHAGUS, STOMACH, & DUODENUM &/OR JEJUNUM; DX W/WO COLLECTION SPECIMN, BY BRUSH OR WASH;  Surgeon: Neysa Hotter, MD;  Location: [facility] PROC[facility];  Service: Gastroenterology    PR XCAPSL CTRC RMVL INSJ IO LENS PROSTH CPLX WO ECP Right 05/29/2022    Procedure: EXTRACAPSULAR CATARACT REMOVAL WITH INSERTION OF INTRAOCULAR LENS PROSTHESIS, COMPLEX WITHOUT ENDOSCOPIC CYCLOPHOTOCOAGULATION;  Surgeon: Garner Gavel, MD;  Location: [facility] OR [facility];  Service: Ophthalmology    PR XCAPSL CTRC RMVL INSJ IO LENS PROSTH W/O ECP Left 07/03/2022    Procedure: EXTRACAPSULAR CATARACT REMOVAL W/INSERTION OF INTRAOCULAR LENS PROSTHESIS, MANUAL OR MECHANICAL TECHNIQUE WITHOUT ENDOSCOPIC CYCLOPHOTOCOAGULATION;  Surgeon: Garner Gavel, MD;  Location: [facility] OR [facility];  Service: Ophthalmology       Social History:  Social History     Tobacco Use    Smoking status: Every Day     Current packs/day: 1.00     Average packs/day: 1 pack/day for 35.0 years (35.0 ttl pk-yrs)     Types: Cigarettes    Smokeless tobacco: Never    Tobacco comments:     Started smoking at 33,    Vaping Use    Vaping status: Some Days   Substance Use Topics    Alcohol use: No     Alcohol/week: 0.0 standard drinks of alcohol    Drug use: Yes     Types: Marijuana     Comment: once a month       Family History:  Family History   Problem Relation Age of Onset    Diabetes  Mother         type 2    Breast cancer Mother     Diabetes Father         type 2    Cataracts Father     Macular degeneration Neg Hx     Retinal detachment Neg Hx     Colon cancer Neg Hx     Endometrial cancer Neg Hx     Ovarian cancer Neg Hx     Glaucoma Neg Hx     Blindness Neg Hx            Objective   There were no vitals filed for this visit.      Physical Exam:  General: Well-appearing.  HEENT: Sclera anicteric;   Pulmonary: Unremarkable respiratory effort.   Neuro: Moving all four extremities spontaneously, able to walk to the exam table independently   Psych: Euthymic affect.  MSK:     Hands/Wrists: Swelling with bogginess/warmth of L wrist. Otherwise no bogginess/warmth of any hand joint. Hand grip weaker on L and than R.  Elbows: No swelling or tenderness to palpation of the elbows.  Shoulders: No swelling or tenderness to palpation of the bilateral shoulders.  Knees: No swelling or tenderness to palpation of the bilateral knees.  Ankles: No swelling or tenderness to palpation of the bilateral ankles.  Feet: Tenderness to palpation of the lateral hips.   Skin: No evidence of erythema or rash.  Extremities: Warm, no LE edema.    I personally spent 38 minutes face-to-face and non-face-to-face in the care of this patient, which includes all pre, intra, and post visit time on the date of service.  All documented time was specific to the E/M visit and does not include any procedures that may have been performed.

## 2022-09-29 NOTE — Unmapped (Signed)
I saw and evaluated the patient, participating in the key portions of the service.  I reviewed the resident???s note. I agree with the resident???s findings and plan. I provided the required supervision for all services billed.   Domenic Moras , MD

## 2022-09-30 MED FILL — RASUVO (PF) 25 MG/0.5 ML SUBCUTANEOUS AUTO-INJECTOR: SUBCUTANEOUS | 28 days supply | Qty: 2 | Fill #5

## 2022-10-09 ENCOUNTER — Ambulatory Visit
Admit: 2022-10-09 | Discharge: 2022-10-10 | Payer: MEDICARE | Attending: Student in an Organized Health Care Education/Training Program | Primary: Student in an Organized Health Care Education/Training Program

## 2022-10-09 DIAGNOSIS — Z9889 Other specified postprocedural states: Principal | ICD-10-CM

## 2022-10-09 MED ORDER — PREDNISOLONE ACETATE 1 % EYE DROPS,SUSPENSION
Freq: Four times a day (QID) | OPHTHALMIC | 0 refills | 25 days | Status: CP
Start: 2022-10-09 — End: ?

## 2022-10-09 NOTE — Unmapped (Signed)
#   Pseudophakia, OU  # Capsular phimosis, OS  S/p OD CEIOL Ford/Ranjan (05/29/2022)  S/p OS CEIOL Ford/Ranjan (07/03/2022)  - Complications: none  - Notable intraop findings: none  - Significant anterior capsule phimosis OS in visual axis s/p yag with Banna/shah     - reports improvement in vision    Plan   Start at four times a day left eye      -  RTC 1 year cornea cap v/t/mrx/d    Cristine Polio, MD   [facility] Ophthalmology Department, PGY-3    Seen with Dr. Georgeann Oppenheim

## 2022-10-20 DIAGNOSIS — M0579 Rheumatoid arthritis with rheumatoid factor of multiple sites without organ or systems involvement: Principal | ICD-10-CM

## 2022-10-24 ENCOUNTER — Ambulatory Visit
Admit: 2022-10-24 | Discharge: 2022-10-25 | Payer: MEDICARE | Attending: Student in an Organized Health Care Education/Training Program | Primary: Student in an Organized Health Care Education/Training Program

## 2022-10-24 DIAGNOSIS — M069 Rheumatoid arthritis, unspecified: Principal | ICD-10-CM

## 2022-10-24 LAB — CBC W/ AUTO DIFF
BASOPHILS ABSOLUTE COUNT: 0.1 10*9/L (ref 0.0–0.1)
BASOPHILS RELATIVE PERCENT: 0.9 %
EOSINOPHILS ABSOLUTE COUNT: 0.2 10*9/L (ref 0.0–0.5)
EOSINOPHILS RELATIVE PERCENT: 2.8 %
HEMATOCRIT: 37.8 % (ref 34.0–44.0)
HEMOGLOBIN: 12.7 g/dL (ref 11.3–14.9)
LYMPHOCYTES ABSOLUTE COUNT: 1.8 10*9/L (ref 1.1–3.6)
LYMPHOCYTES RELATIVE PERCENT: 28.4 %
MEAN CORPUSCULAR HEMOGLOBIN CONC: 33.7 g/dL (ref 32.0–36.0)
MEAN CORPUSCULAR HEMOGLOBIN: 32.5 pg — ABNORMAL HIGH (ref 25.9–32.4)
MEAN CORPUSCULAR VOLUME: 96.3 fL — ABNORMAL HIGH (ref 77.6–95.7)
MEAN PLATELET VOLUME: 7 fL (ref 6.8–10.7)
MONOCYTES ABSOLUTE COUNT: 0.5 10*9/L (ref 0.3–0.8)
MONOCYTES RELATIVE PERCENT: 7.2 %
NEUTROPHILS ABSOLUTE COUNT: 3.9 10*9/L (ref 1.8–7.8)
NEUTROPHILS RELATIVE PERCENT: 60.7 %
PLATELET COUNT: 294 10*9/L (ref 150–450)
RED BLOOD CELL COUNT: 3.92 10*12/L — ABNORMAL LOW (ref 3.95–5.13)
RED CELL DISTRIBUTION WIDTH: 16 % — ABNORMAL HIGH (ref 12.2–15.2)
WBC ADJUSTED: 6.5 10*9/L (ref 3.6–11.2)

## 2022-10-24 LAB — HEPATIC FUNCTION PANEL
ALBUMIN: 3.4 g/dL (ref 3.4–5.0)
ALKALINE PHOSPHATASE: 69 U/L (ref 46–116)
ALT (SGPT): 8 U/L — ABNORMAL LOW (ref 10–49)
AST (SGOT): 18 U/L (ref <=34)
BILIRUBIN DIRECT: <0.1 mg/dL (ref 0.00–0.30)
BILIRUBIN TOTAL: <0.2 mg/dL — ABNORMAL LOW (ref 0.3–1.2)
PROTEIN TOTAL: 7.3 g/dL (ref 5.7–8.2)

## 2022-10-24 LAB — CREATININE
CREATININE: 0.89 mg/dL
EGFR CKD-EPI (2021) FEMALE: 72 mL/min/{1.73_m2} (ref >=60)

## 2022-10-24 MED ADMIN — lidocaine (XYLOCAINE) 10 mg/mL (1 %) injection 2 mL: 2 mL | @ 19:00:00 | Stop: 2022-10-24

## 2022-10-24 MED ADMIN — methylPREDNISolone acetate (DEPO-Medrol) injection 40 mg: 40 mg | INTRA_ARTICULAR | @ 19:00:00 | Stop: 2022-10-24

## 2022-10-24 NOTE — Unmapped (Signed)
Assessment/Plan:   Bethany Meyer is a 67 y.o. 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    Seropositive (+CCP) non erosive RA   Doing well today overall with some return in stiffness but no overt synovitis outside of L wrist. Discussed proceeding with next Ritux dose, she is amenable. Also will plan to inject wrist as outlined in procedure note. She had significant relief before with this.  - Continue MTX 25mg  SQ  - Continue FA daily  - Ritux 2nd 1g infusion (1st done 04/2022) remains to start Ritux, will re-explore this  - CBC w/ diff, Cr, hepatic panel  - Corticosteroid injection to L wrist (see separate procedure note)    Swollen Joint Count (0-28): 1  Tender Joint Count (0-28): 1  Patient Global Assessment of Disease Activity (0-10): 2  Evaluator Global Assessment of Disease (0-10): 4  CDAI Score: 8  CDAI interpretation:  0.0-2.8 Remission   2.9-10.0 Low disease activity   10.1-22.0 Moderate disease activity   22.1-76.0 High disease activity       Spinal stenosis  Patient with an MRI from 12/24/19 demonstrating severe spinal canal stenosis and foraminal narrowing at L4-5. Now with dramatic improvement after spinal surgery.     Osteoporosis - Fall resulting in right proximal femur greater trochanteric fracture  Dexa 02/2021 with left hip T score -2.7 c/w osteoporosis. Started Fosamax but has self-d/c'd multiple times. Has not taken for a significant continuous period.   - Fosamax qweekly    Tobacco use  She was smoking at least a pack per day, now down to ~8/day.   -- smoking cessation encouraged     Normocytic anemia   --will check CBC today.     Health Care Maintenance:  Routine health maintenance discussed  Immunization History   Administered Date(s) Administered Comments    COVID-19 VAC,BIVALENT(65YR UP),PFIZER 05/03/2021      11/01/2021     COVID-19 VACCINE,MRNA(MODERNA)(PF) 06/29/2019 Historical - Not administered in Epic     07/27/2019 Historical - Not administered in Epic     04/07/2020     Covid-19 Vac, (9yr+) (Spikevax) Monovalent Xbb.1.5 Moder  05/03/2022     DTP 12/22/1962 Historical - Not administered in Epic     02/04/1963 Historical - Not administered in Epic     07/22/1963 Historical - Not administered in Epic    HEPATITIS B VACCINE ADULT,IM(ENERGIX B, RECOMBIVAX) 01/03/2015      03/13/2015     Hepatitis B Vaccine, Unspecified Formulation 08/19/1996 Historical - Not administered in Epic     02/24/1997 Historical - Not administered in Epic    INFLUENZA QUAD HIGH DOSE 62YRS+(FLUZONE) 03/03/2019     Influenza Vaccine Quad(IM)6 MO-Adult(PF) 02/10/2013      05/03/2014      03/13/2015      03/20/2016      07/12/2017      03/11/2018      02/14/2020      05/03/2021      02/01/2022     Influenza Virus Vaccine, unspecified formulation 02/10/2013      05/03/2014      03/13/2015      03/20/2016      07/12/2017      03/11/2018     MMR 12/07/1998 Historical - Not administered in Epic     12/20/1998 Historical - Not administered in Epic     04/04/1999 Historical - Not administered in Epic  Meningococcal Conjugate MCV4P 05/03/2021     OPV 07/19/1962 Historical - Not administered in Epic     09/13/1962 Historical - Not administered in Epic     12/22/1962 Historical - Not administered in Epic    PNEUMOCOCCAL POLYSACCHARIDE 23-VALENT 01/11/2015     Pneumococcal Conjugate 20-valent 11/01/2021     Pneumococcal conjugate -PCV7 02/18/2012     TD(TDVAX),ADSORBED,2LF(IM)(PF) 12/07/1998 Historical - Not administered in Epic    TdaP 01/01/2011    Deferred Date(s) Deferred Comments    Influenza Vaccine Quad(IM)6 MO-Adult(PF) 05/17/2019      06/24/2019 Pt remember she had a dose already last year.  See immunization record.       Diagnoses and all orders for this visit:  Rheumatoid arthritis, involving unspecified site, unspecified whether rheumatoid factor present (CMS-HCC) [M06.9]  - CBC w/ Differential  - Creatinine  - Hepatic Function Panel  - lidocaine (XYLOCAINE) 10 mg/mL (1 %) injection 2 mL  - methylPREDNISolone acetate (DEPO-Medrol) injection 40 mg          I appreciate the opportunity to participate and collaborate in the care of this patient.      History of Present Illness:     Primary Care Provider: Leda Min, PA    HPI:  Bethany Meyer is a 67 y.o.  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    Today  About 3 weeks ago she felt like her joints started getting more active. Mainly feet and L wrist. Swollen and stiff. Still on MTX 25mg  and tolearting well. Felt the Rituximab helped significantly. Is hoping for another wrist injection as the last one helped significantly.     As background  [name]'s joint pain first started around May/June 2016 with right knee pain and swelling, then right foot and ankle swelling. She is RF negative, CCP positive. She was started on methotrexate but had persistent joint pain and swelling so was started on Humira in March 2017. She is on methotrexate 20mg  weekly po (have been unable to get SQ approved by insurance).  She was increased to weekly Humira but continued to have joint pain and swelling.  Enbrel was not approved by insurance so prescription was sent for [name] instead in 2018. [name] was initially effective but the patient experienced several flares while on this medication. This was discontinued in May 2019 and she was started on Cimzia in June 2019. Cimzia was discontinued in summer 2020 due to lack of perceived benefit from medication.    Weight Change Past 6 Months in Pounds (rounded): 9.4 at 10/30/2022  8:23 AM    Balance of 10 systems was reviewed and is negative except for that mentioned in the HPI.    Review of records: I have reviewed labs/images/clinic notes per the computerized medical record.   X-ray: IMPRESSION:  -Subtle cortical irregularity at the second middle phalanx at the PIP joint, which is only visualized on oblique views and may be artifactual. Recommend correlation with point tenderness if fracture in this region is suspected.    Allergies:  Patient has no known allergies.    Medications:     Current Outpatient Medications:     acetaminophen (TYLENOL) 325 MG tablet, Take 1 tablet (325 mg total) by mouth two (2) times a day as needed for pain., Disp: , Rfl:     alendronate (FOSAMAX) 70 MG tablet, Take 1 tablet (70 mg total) by mouth  every seven (7) days., Disp: 12 tablet, Rfl: 3    arm brace Misc, Boxer splint, metacarpal wrist brace, Disp: 1 each, Rfl: 0    aspirin 81 MG chewable tablet, CHEW 1 TABLET BY MOUTH DAILY, Disp: 36 tablet, Rfl: 3    buPROPion (WELLBUTRIN XL) 150 MG 24 hr tablet, Take 1 tablet (150 mg total) by mouth every morning., Disp: 90 tablet, Rfl: 2    dicyclomine (BENTYL) 20 mg tablet, Take 1 tablet (20 mg total) by mouth four times a day as needed (stomach cramps)., Disp: 360 tablet, Rfl: 3    empty container Misc, Use as directed, Disp: 1 each, Rfl: 2    estradioL (ESTRACE) 0.01 % (0.1 mg/gram) vaginal cream, Insert 2 g into the vagina Two (2) times a week. Insert 2 g inside vagina and apply a pea-sized amount around urethra twice weekly, Disp: , Rfl: 0    ferrous sulfate 325 (65 FE) MG EC tablet, Take 1 tablet (325 mg total) by mouth every other day., Disp: 45 tablet, Rfl: 3    folic acid (FOLVITE) 1 MG tablet, Take 1 tablet (1 mg total) by mouth daily., Disp: 90 tablet, Rfl: 3    gabapentin (NEURONTIN) 300 MG capsule, 3 capsules po morning and midday, 4 capsules po qhs, Disp: 360 capsule, Rfl: 3    haloperidoL (HALDOL) 0.5 MG tablet, Take 1 tablet (0.5 mg total) by mouth daily before breakfast., Disp: 90 tablet, Rfl: 3    hydrOXYzine (ATARAX) 10 MG tablet, TAKE 1 TABLET (10 MG TOTAL) BY MOUTH 3 TIMES A DAY AS NEEDED FOR ITCHING, Disp: 270 tablet, Rfl: 1    leg brace (KNEE SUPPORT BRACE) Misc, 1 each by Miscellaneous route daily as needed., Disp: 1 each, Rfl: 0    methocarbamol (ROBAXIN) 500 MG tablet, TAKE 1 TABLET (500 MG TOTAL) BY MOUTH THREE TIMES A DAY., Disp: 90 tablet, Rfl: 1    needle, disp, 25 gauge 25 gauge x 5/8 Ndle, For methotrexate injections, Disp: 100 each, Rfl: 0    predniSONE (DELTASONE) 10 MG tablet, Take 1 tablet (10 mg total) by mouth daily., Disp: 30 tablet, Rfl: 0    traMADol (ULTRAM) 50 mg tablet, Take 1 tablet (50 mg total) by mouth every six (6) hours as needed for pain., Disp: 30 tablet, Rfl: 0    traZODone (DESYREL) 100 MG tablet, 1/2 tablet for 3 nights then increase to 1 tablet before bed, Disp: 90 tablet, Rfl: 1    VITAMIN B-1 100 mg tablet, Take 1 tablet (100 mg total) by mouth daily., Disp: 100 tablet, Rfl: 3    methotrexate, PF, (RASUVO, PF,) 25 mg/0.5 mL [name], Inject the contents of 1 auto-injector (25 mg total) under the skin every seven (7) days., Disp: 2 mL, Rfl: 5    nicotine (NICODERM CQ) 14 mg/24 hr patch, Place 1 patch on the skin daily. (Patient not taking: Reported on 10/24/2022), Disp: 28 patch, Rfl: 1    nicotine (NICODERM CQ) 14 mg/24 hr patch, Place 1 patch on the skin daily. (Patient not taking: Reported on 10/24/2022), Disp: 28 patch, Rfl: 0    Medical History:  Past Medical History:   Diagnosis Date    Arthritis     RA    Cataract     Functional dyspepsia     GERD (gastroesophageal reflux disease)     Gout     RA (rheumatoid arthritis) (CMS-HCC)     Smoking     Stage 1 mild COPD by GOLD  classification (CMS-HCC) 02/25/2017       Surgical History:  Past Surgical History:   Procedure Laterality Date    ovarian cyst removed      OVARY SURGERY      PR COLONOSCOPY FLX DX W/COLLJ SPEC WHEN PFRMD N/A 03/05/2013    Procedure: COLONOSCOPY, FLEXIBLE, PROXIMAL TO SPLENIC FLEXURE; DIAGNOSTIC, W/WO COLLECTION SPECIMEN BY BRUSH OR WASH;  Surgeon: Trudie Buckler, MD;  Location: GI PROCEDURES [facility];  Service: Gastroenterology    PR COMPRE EP EVAL ABLTJ 3D MAPG TX SVT N/A 12/26/2015    Procedure: A-Flutter Ablation;  Surgeon: Theresia Majors, MD;  Location: [facility] EP;  Service: Cardiology    PR UP GI ENDOSCOPY,BALL DIL,30MM N/A 06/03/2019    Procedure: UGI ENDO; W/BALLOON DILAT ESOPHAGUS (<30MM DIAM);  Surgeon: Neysa Hotter, MD;  Location: [facility];  Service: Gastroenterology    PR UPPER GI ENDOSCOPY,BIOPSY N/A 09/16/2012    Procedure: UGI ENDOSCOPY; WITH BIOPSY, SINGLE OR MULTIPLE;  Surgeon: Brown Human, MD;  [facility];  Service: Gastroenterology    PR UPPER GI ENDOSCOPY,BIOPSY N/A 06/03/2019    Procedure: UGI ENDOSCOPY; WITH BIOPSY, SINGLE OR MULTIPLE;  Surgeon: Neysa Hotter, MD;  Location: [facility] PROCED[facility];  Service: Gastroenterology    PR UPPER GI ENDOSCOPY,DIAGNOSIS N/A 06/03/2019    Procedure: UGI ENDO, INCLUDE ESOPHAGUS, STOMACH, & DUODENUM &/OR JEJUNUM; DX W/WO COLLECTION SPECIMN, BY BRUSH OR WASH;  Surgeon: Neysa Hotter, MD;  Location: [facility] PROCED[facility];  Service: Gastroenterology    PR XCAPSL CTRC RMVL INSJ IO LENS PROSTH CPLX WO ECP Right 05/29/2022    Procedure: EXTRACAPSULAR CATARACT REMOVAL WITH INSERTION OF INTRAOCULAR LENS PROSTHESIS, COMPLEX WITHOUT ENDOSCOPIC CYCLOPHOTOCOAGULATION;  Surgeon: Garner Gavel, MD;  Location: [facility] OR [facility];  Service: Ophthalmology    PR XCAPSL CTRC RMVL INSJ IO LENS PROSTH W/O ECP Left 07/03/2022    Procedure: EXTRACAPSULAR CATARACT REMOVAL W/INSERTION OF INTRAOCULAR LENS PROSTHESIS, MANUAL OR MECHANICAL TECHNIQUE WITHOUT ENDOSCOPIC CYCLOPHOTOCOAGULATION;  Surgeon: Garner Gavel, MD;  Location: [facility] OR [facility];  Service: Ophthalmology       Social History:  Social History     Tobacco Use    Smoking status: Every Day     Current packs/day: 1.00     Average packs/day: 1 pack/day for 35.0 years (35.0 ttl pk-yrs)     Types: Cigarettes    Smokeless tobacco: Never    Tobacco comments:     Started smoking at 33,    Vaping Use    Vaping status: Some Days   Substance Use Topics    Alcohol use: No     Alcohol/week: 0.0 standard drinks of alcohol    Drug use: Yes     Types: Marijuana     Comment: once a month       Family History:  Family History   Problem Relation Age of Onset    Diabetes Mother         type 2    Breast cancer Mother     Diabetes Father         type 2    Cataracts Father     Macular degeneration Neg Hx     Retinal detachment Neg Hx     Colon cancer Neg Hx     Endometrial cancer Neg Hx     Ovarian cancer Neg Hx     Glaucoma Neg Hx     Blindness  Neg Hx            Objective   Vitals:    10/24/22 1350   BP: 102/69   BP Site: L Arm   BP Position: Sitting   BP Cuff Size: Small   Pulse: 73   Temp: 36.3 ??C (97.4 ??F)   TempSrc: Temporal   Weight: 60.5 kg (133 lb 6.4 oz)         Physical Exam:  General: Well-appearing.  HEENT: Sclera anicteric;   Pulmonary: Unremarkable respiratory effort.   Neuro: Moving all four extremities spontaneously, able to walk to the exam table independently   Psych: Euthymic affect.  MSK:     Hands/Wrists: Swelling with bogginess/warmth of L wrist (improved from prior). Otherwise no bogginess/warmth of any hand joint. Hand grip weaker on L and than R.  Elbows: No swelling or tenderness to palpation of the elbows.  Shoulders: No swelling or tenderness to palpation of the bilateral shoulders.  Knees: No swelling or tenderness to palpation of the bilateral knees.  Ankles: No swelling or tenderness to palpation of the bilateral ankles.  Feet: Tenderness to palpation of the lateral hips.   Skin: No evidence of erythema or rash.  Extremities: Warm, no LE edema.    I personally spent 32 minutes face-to-face and non-face-to-face in the care of this patient, which includes all pre, intra, and post visit time on the date of service.  All documented time was specific to the E/M visit and does not include any procedures that may have been performed.

## 2022-10-24 NOTE — Unmapped (Addendum)
Patient information following joint aspiration/injection    What is an aspiration and/or injection?    Aspiration and injection refer to the placement of a needle into a joint cavity or bursa to remove fluid for analysis or study, and to inject medicine for pain relief or treatment of a disease (rheumatoid arthritis, gout, pseudogout, infectious arthritis, others). Sometimes the doctor may only remove some of the joint fluid to establish a diagnosis (or cause) for the collection of fluid. In other cases, such as in patients with chronic osteoarthritis, the doctor may only inject medicine to provide pain relief.    Are there risks with needle aspiration and injection?    The risks of this procedure are few. Introducing infection into the joint is uncommon, occurring in less than 0.01 percent of patients. Rarely, the needle tip may damage the cartilage surface inside the joint. The greatest risk of injury to the joint appears to be associated with placing too much medicine in the joint or administering the medicine too often.    Is the procedure painful?    Most people find the procedure tolerable. However, the procedure can hurt if the needle touches bone or an inflamed tissue. Your doctor will try to avoid these surfaces, but sometimes this cannot be prevented. If you feel discomfort, it will generally be brief. This procedure is most commonly performed without an anesthetic (numbing medicine). Pain occurring hours after an injection into a joint can develop from the crystals of medicine that are inserted and may be prevented by taking an anti-inflammatory medicine such as ibuprofen (brand names: Advil, Motrin, Nuprin).  Icing the area for 10 minutes every few hours after the procedure may also be helpful.    If I had a large amount of fluid removed from the joint, can it come back?    Yes. A collection of fluid within the joint or bursa is called an effusion. It is common for an effusion to recur after removal of a large amount of fluid. Some experts recommend that an elastic (ACE) wrap be placed over the joint after removal of large amounts of fluid.    If medicine is injected, how long will it be effective?    The response to medicine injected into the joint is highly variable. Some people get lasting relief, whereas others may only notice improvement for days to weeks. Call your doctor if the pain returns quickly.        Following Joint Aspiration and Injection    *A bandage was placed over the needle insertion site. You can remove it at any time.    *One complication of a joint injection is postinjection flare. This is a rare inflammatory reaction to the medicine placed in the joint. The reaction produces intense pain, beginning about 6 to 12 hours after the injection. Patients usually complain that their pain is worse than it was before the injection was performed. To blunt or eliminate this reaction, take ibuprofen, three 200-mg tablets three times a day, for at least the first two days after the injection.    *If a large amount of fluid was removed from the joint, you may be asked to wear an elastic (ACE) wrap over the site. The wrap is applied to create support and pressure that may reduce the tendency for the fluid to come back. Do not wrap the ACE wrap so tightly that the extremity becomes numb or looks blue.    *If laboratory studies were performed on the joint fluid,  the results should come back in the next few days. Your doctor's office will contact you with the results. Please keep any scheduled follow-up appointments with your doctor.    *Infection in the joint is uncommon after joint aspiration and injection. If the joint becomes red, warm, or tender, or if you develop a fever in the first few days after the procedure, please call your doctor.    *If your joint was injected with medicine and local anesthetic, it may be numb for several hours after the procedure. Avoid heavy exercise or placing excessive strain on the injected joint (e.g., carrying children or heavy objects) for the first two weeks after the procedure. Jumping up and down, for instance, can place a heavy load on an anesthetized joint and should be avoided.    *Joint pain may slowly return after an injection. Some people receive long-term pain relief, but for most people the pain gradually returns. Continue your long-term medicine for the joint as prescribed by your doctor.    During regular hours, for any problems directly related to the injection, please call [phone] and ask for Dr. Delton See.  For other issues, please contact your primary rheumatologist.    After hours or on weekends, please call [phone] and ask to be connected to the  on-call rheumatologist.

## 2022-10-25 DIAGNOSIS — M069 Rheumatoid arthritis, unspecified: Principal | ICD-10-CM

## 2022-10-25 MED ORDER — RASUVO (PF) 25 MG/0.5 ML SUBCUTANEOUS AUTO-INJECTOR
SUBCUTANEOUS | 5 refills | 28 days
Start: 2022-10-25 — End: ?

## 2022-10-28 MED ORDER — RASUVO (PF) 25 MG/0.5 ML SUBCUTANEOUS AUTO-INJECTOR
SUBCUTANEOUS | 5 refills | 28 days | Status: CP
Start: 2022-10-28 — End: ?
  Filled 2022-12-04: qty 2, 28d supply, fill #0

## 2022-10-30 NOTE — Unmapped (Signed)
PROCEDURE NOTE: After a time out and discussion of risks and benefits, including the option for continued conservative management, the patient gave verbal consent for a  left  wrist  injection. Risks discussed included infection (1:5000), pain, bleeding and damage to local structures. A time out was completed confirming patient identification, consent, and location of procedure. The  left  wrist  was identified and marked.  This area was prepped with betadine and ethyl chloride was used for topical anesthesia.   1 mL of 1% lidocaine along with  40 mg of depomedrol was injected into this site.  The patient tolerated the procedure well and there were no immediate complications. Patient advised on post-injection flare management and iatrogenic septic joint signs/symptoms requiring immediate medical attention.

## 2022-12-02 NOTE — Unmapped (Signed)
[facility] Refill Coordination Note    Specialty Lite Medication(s) to be Shipped:   Rasuvo    Other medication(s) to be shipped: No additional medications requested for fill at this time     Bethany Meyer, DOB: Jan 19, 1956  Phone: [phone] (home)       All above HIPAA information was verified with patient.     Was a Nurse, learning disability used for this call? No    Changes to medications: Bethany Meyer reports no changes at this time.  Changes to insurance: No      REFERRAL TO PHARMACIST     Referral to the pharmacist: Not needed      [facility]     Shipping address confirmed in Epic.     Delivery Scheduled: Yes, Expected medication delivery date: 12/05/22.     Medication will be delivered via UPS to the prescription address in Epic WAM.    Bethany Meyer   [facility] Specialty Technician

## 2022-12-23 NOTE — Unmapped (Signed)
Pt called and requests an appt for pain and edema in her hands and feet

## 2022-12-24 NOTE — Unmapped (Signed)
1st attempt unable to contact-LVM  Renea Ee

## 2022-12-26 ENCOUNTER — Ambulatory Visit
Admit: 2022-12-26 | Discharge: 2022-12-27 | Payer: MEDICARE | Attending: Student in an Organized Health Care Education/Training Program | Primary: Student in an Organized Health Care Education/Training Program

## 2022-12-26 DIAGNOSIS — M069 Rheumatoid arthritis, unspecified: Principal | ICD-10-CM

## 2022-12-26 DIAGNOSIS — G5602 Carpal tunnel syndrome, left upper limb: Principal | ICD-10-CM

## 2022-12-26 MED ORDER — PREDNISONE 10 MG TABLET
ORAL_TABLET | ORAL | 0 refills | 58 days | Status: CP
Start: 2022-12-26 — End: 2023-02-22

## 2022-12-26 NOTE — Unmapped (Signed)
Assessment/Plan:   Bethany Meyer is a 67 y.o. 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    Seropositive (+CCP) non erosive RA   Unfortunately with ongoing disease activity today in setting of not obtaining Rituximab infusion (did not obtain 2nd infusion and did not schedule for 17mo repeat infusions). Discussed importance of scheduling this infusion. Otherwise, pred taper given today.  - Continue MTX 25mg  SQ  - Continue FA daily  - Ritux ordered today (last infusion Ritux 1g 04/2022)  - pred 20mg  x7d, 15mg  x7d, 10mg  x14d, 5mg  x14d  - Reviewed recent labs (10/2022: CBC w/ diff, Cr, hepatic panel) - no drug toxicity    Swollen Joint Count (0-28): 5  Tender Joint Count (0-28): 6  Patient Global Assessment of Disease Activity (0-10): 8  Evaluator Global Assessment of Disease (0-10): 4  CDAI Score: 23  CDAI interpretation:  0.0-2.8 Remission   2.9-10.0 Low disease activity   10.1-22.0 Moderate disease activity   22.1-76.0 High disease activity       Spinal stenosis  Patient with an MRI from 12/24/19 demonstrating severe spinal canal stenosis and foraminal narrowing at L4-5. Now with dramatic improvement after spinal surgery.     Osteoporosis - Fall resulting in right proximal femur greater trochanteric fracture  Dexa 02/2021 with left hip T score -2.7 c/w osteoporosis. Started Fosamax but has self-d/c'd multiple times. Has not taken for a significant continuous period.   - Fosamax qweekly    Tobacco use  She was smoking at least a pack per day, now down to ~8/day.   -- smoking cessation encouraged     Normocytic anemia   --will check CBC today.     Health Care Maintenance:  Routine health maintenance discussed  Immunization History   Administered Date(s) Administered Comments    COVID-19 VAC,BIVALENT(34YR UP),PFIZER 05/03/2021      11/01/2021     COVID-19 VACCINE,MRNA(MODERNA)(PF) 06/29/2019 Historical - Not administered in Epic     07/27/2019 Historical - Not administered in Epic     04/07/2020     Covid-19 Vac, (59yr+) (Spikevax) Monovalent Xbb.1.5 Moder  05/03/2022     DTP 12/22/1962 Historical - Not administered in Epic     02/04/1963 Historical - Not administered in Epic     07/22/1963 Historical - Not administered in Epic    HEPATITIS B VACCINE ADULT,IM(ENERGIX B, RECOMBIVAX) 01/03/2015      03/13/2015     Hepatitis B Vaccine, Unspecified Formulation 08/19/1996 Historical - Not administered in Epic     02/24/1997 Historical - Not administered in Epic    INFLUENZA QUAD HIGH DOSE 61YRS+(FLUZONE) 03/03/2019     Influenza Vaccine Quad(IM)6 MO-Adult(PF) 02/10/2013      05/03/2014      03/13/2015      03/20/2016      07/12/2017      03/11/2018      02/14/2020      05/03/2021      02/01/2022     Influenza Virus Vaccine, unspecified formulation 02/10/2013      05/03/2014      03/13/2015      03/20/2016      07/12/2017      03/11/2018     MMR 12/07/1998 Historical - Not administered in Epic     12/20/1998 Historical - Not administered in Epic     04/04/1999 Historical - Not administered in Epic    Meningococcal Conjugate  MCV4P 05/03/2021     OPV 07/19/1962 Historical - Not administered in Epic     09/13/1962 Historical - Not administered in Epic     12/22/1962 Historical - Not administered in Epic    PNEUMOCOCCAL POLYSACCHARIDE 23-VALENT 01/11/2015     Pneumococcal Conjugate 20-valent 11/01/2021     Pneumococcal conjugate -PCV7 02/18/2012     TD(TDVAX),ADSORBED,2LF(IM)(PF) 12/07/1998 Historical - Not administered in Epic    TdaP 01/01/2011    Deferred Date(s) Deferred Comments    Influenza Vaccine Quad(IM)6 MO-Adult(PF) 05/17/2019      06/24/2019 Pt remember she had a dose already last year.  See immunization record.       Diagnoses and all orders for this visit:  Rheumatoid arthritis, involving unspecified site, unspecified whether rheumatoid factor present (CMS-HCC)  - predniSONE (DELTASONE) 10 MG tablet; Take 2 tablets (20 mg total) by mouth daily for 7 days, THEN 1.5 tablets (15 mg total) daily for 7 days, THEN 1 tablet (10 mg total) daily for 14 days, THEN 0.5 tablets (5 mg total) daily.    Carpal tunnel syndrome of left wrist  - predniSONE (DELTASONE) 10 MG tablet; Take 2 tablets (20 mg total) by mouth daily for 7 days, THEN 1.5 tablets (15 mg total) daily for 7 days, THEN 1 tablet (10 mg total) daily for 14 days, THEN 0.5 tablets (5 mg total) daily.            I appreciate the opportunity to participate and collaborate in the care of this patient.      History of Present Illness:     Primary Care Provider: Leda Min, PA    HPI:  Bethany Meyer is a 67 y.o.  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    Today  About 3 weeks ago she felt like her joints started getting more active. Mainly feet and L wrist. Swollen and stiff. Still on MTX 25mg  and tolearting well. Felt the Rituximab helped significantly. Is hoping for another wrist injection as the last one helped significantly.     As background  Bethany Meyer's joint pain first started around May/June 2016 with right knee pain and swelling, then right foot and ankle swelling. She is RF negative, CCP positive. She was started on methotrexate but had persistent joint pain and swelling so was started on Humira in March 2017. She is on methotrexate 20mg  weekly po (have been unable to get SQ approved by insurance).  She was increased to weekly Humira but continued to have joint pain and swelling.  Enbrel was not approved by insurance so prescription was sent for [name] instead in 2018. [name] was initially effective but the patient experienced several flares while on this medication. This was discontinued in May 2019 and she was started on Cimzia in June 2019. Cimzia was discontinued in summer 2020 due to lack of perceived benefit from medication.    Weight Change Past 6 Months in Pounds (rounded): 4 at 12/29/2022 12:06 PM    Balance of 10 systems was reviewed and is negative except for that mentioned in the HPI.    Review of records: I have reviewed labs/images/clinic notes per the computerized medical record.   X-ray: IMPRESSION:  -Subtle cortical irregularity at the second middle phalanx at the PIP joint, which is only visualized on oblique views and may be artifactual. Recommend correlation with point tenderness if fracture in this region is suspected.  Allergies:  Patient has no known allergies.    Medications:     Current Outpatient Medications:     alendronate (FOSAMAX) 70 MG tablet, Take 1 tablet (70 mg total) by mouth every seven (7) days., Disp: 12 tablet, Rfl: 3    folic acid (FOLVITE) 1 MG tablet, Take 1 tablet (1 mg total) by mouth daily., Disp: 90 tablet, Rfl: 3    methotrexate, PF, (RASUVO, PF,) 25 mg/0.5 mL [name], Inject the contents of 1 auto-injector (25 mg total) under the skin every seven (7) days., Disp: 2 mL, Rfl: 5    acetaminophen (TYLENOL) 325 MG tablet, Take 1 tablet (325 mg total) by mouth two (2) times a day as needed for pain., Disp: , Rfl:     arm brace Misc, Boxer splint, metacarpal wrist brace, Disp: 1 each, Rfl: 0    aspirin 81 MG chewable tablet, CHEW 1 TABLET BY MOUTH DAILY, Disp: 36 tablet, Rfl: 3    buPROPion (WELLBUTRIN XL) 150 MG 24 hr tablet, Take 1 tablet (150 mg total) by mouth every morning., Disp: 90 tablet, Rfl: 2    dicyclomine (BENTYL) 20 mg tablet, Take 1 tablet (20 mg total) by mouth four times a day as needed (stomach cramps)., Disp: 360 tablet, Rfl: 3    empty container Misc, Use as directed, Disp: 1 each, Rfl: 2    estradioL (ESTRACE) 0.01 % (0.1 mg/gram) vaginal cream, Insert 2 g into the vagina Two (2) times a week. Insert 2 g inside vagina and apply a pea-sized amount around urethra twice weekly, Disp: , Rfl: 0    gabapentin (NEURONTIN) 300 MG capsule, 3 capsules po morning and midday, 4 capsules po qhs, Disp: 360 capsule, Rfl: 3    haloperidoL (HALDOL) 0.5 MG tablet, Take 1 tablet (0.5 mg total) by mouth daily before breakfast., Disp: 90 tablet, Rfl: 3    hydrOXYzine (ATARAX) 10 MG tablet, TAKE 1 TABLET (10 MG TOTAL) BY MOUTH 3 TIMES A DAY AS NEEDED FOR ITCHING, Disp: 270 tablet, Rfl: 1    leg brace (KNEE SUPPORT BRACE) Misc, 1 each by Miscellaneous route daily as needed., Disp: 1 each, Rfl: 0    methocarbamol (ROBAXIN) 500 MG tablet, TAKE 1 TABLET (500 MG TOTAL) BY MOUTH THREE TIMES A DAY., Disp: 90 tablet, Rfl: 1    needle, disp, 25 gauge 25 gauge x 5/8 Ndle, For methotrexate injections, Disp: 100 each, Rfl: 0    nicotine (NICODERM CQ) 14 mg/24 hr patch, Place 1 patch on the skin daily. (Patient not taking: Reported on 10/24/2022), Disp: 28 patch, Rfl: 1    nicotine (NICODERM CQ) 14 mg/24 hr patch, Place 1 patch on the skin daily. (Patient not taking: Reported on 10/24/2022), Disp: 28 patch, Rfl: 0    predniSONE (DELTASONE) 10 MG tablet, Take 2 tablets (20 mg total) by mouth daily for 7 days, THEN 1.5 tablets (15 mg total) daily for 7 days, THEN 1 tablet (10 mg total) daily for 14 days, THEN 0.5 tablets (5 mg total) daily., Disp: 54 tablet, Rfl: 0    traMADol (ULTRAM) 50 mg tablet, Take 1 tablet (50 mg total) by mouth every six (6) hours as needed for pain., Disp: 30 tablet, Rfl: 0    traZODone (DESYREL) 100 MG tablet, 1/2 tablet for 3 nights then increase to 1 tablet before bed, Disp: 90 tablet, Rfl: 1    VITAMIN B-1 100 mg tablet, Take 1 tablet (100 mg total) by mouth daily., Disp: 100 tablet, Rfl: 3  Medical History:  Past Medical History:   Diagnosis Date    Arthritis     RA    Cataract     Functional dyspepsia     GERD (gastroesophageal reflux disease)     Gout     RA (rheumatoid arthritis) (CMS-HCC)     Smoking     Stage 1 mild COPD by GOLD classification (CMS-HCC) 02/25/2017       Surgical History:  Past Surgical History:   Procedure Laterality Date    ovarian cyst removed OVARY SURGERY      PR COLONOSCOPY FLX DX W/COLLJ SPEC WHEN PFRMD N/A 03/05/2013    Procedure: COLONOSCOPY, FLEXIBLE, PROXIMAL TO SPLENIC FLEXURE; DIAGNOSTIC, W/WO COLLECTION SPECIMEN BY BRUSH OR WASH;  Surgeon: Trudie Buckler, MD;  [facility]: GI PROCEDURES [facility];  Service: Gastroenterology    PR COMPRE EP EVAL ABLTJ 3D MAPG TX SVT N/A 12/26/2015    Procedure: A-Flutter Ablation;  Surgeon: Theresia Majors, MD;  Location: [facility];  Service: Cardiology    PR UP GI ENDOSCOPY,BALL DIL,30MM N/A 06/03/2019    Procedure: UGI ENDO; W/BALLOON DILAT ESOPHAGUS (<30MM DIAM);  Surgeon: Neysa Hotter, MD;  [facility]: [facility];  Service: Gastroenterology    PR UPPER GI ENDOSCOPY,BIOPSY N/A 09/16/2012    Procedure: UGI ENDOSCOPY; WITH BIOPSY, SINGLE OR MULTIPLE;  Surgeon: Brown Human, MD;  [facility];  Service: Gastroenterology    PR UPPER GI ENDOSCOPY,BIOPSY N/A 06/03/2019    Procedure: UGI ENDOSCOPY; WITH BIOPSY, SINGLE OR MULTIPLE;  Surgeon: Neysa Hotter, MD;  Location: [facility]ED[facility];  Service: Gastroenterology    PR UPPER GI ENDOSCOPY,DIAGNOSIS N/A 06/03/2019    Procedure: UGI ENDO, INCLUDE ESOPHAGUS, STOMACH, & DUODENUM &/OR JEJUNUM; DX W/WO COLLECTION SPECIMN, BY BRUSH OR WASH;  Surgeon: Neysa Hotter, MD;  Location: [facility];  Service: Gastroenterology    PR XCAPSL CTRC RMVL INSJ IO LENS PROSTH CPLX WO ECP Right 05/29/2022    Procedure: EXTRACAPSULAR CATARACT REMOVAL WITH INSERTION OF INTRAOCULAR LENS PROSTHESIS, COMPLEX WITHOUT ENDOSCOPIC CYCLOPHOTOCOAGULATION;  Surgeon: Garner Gavel, MD;  Location: [facility];  Service: Ophthalmology    PR XCAPSL CTRC RMVL INSJ IO LENS PROSTH W/O ECP Left 07/03/2022    Procedure: EXTRACAPSULAR CATARACT REMOVAL W/INSERTION OF INTRAOCULAR LENS PROSTHESIS, MANUAL OR MECHANICAL TECHNIQUE WITHOUT ENDOSCOPIC CYCLOPHOTOCOAGULATION;  Surgeon: Garner Gavel, MD; Location: [facility] OR [facility];  Service: Ophthalmology       Social History:  Social History     Tobacco Use    Smoking status: Every Day     Current packs/day: 1.00     Average packs/day: 1 pack/day for 35.0 years (35.0 ttl pk-yrs)     Types: Cigarettes    Smokeless tobacco: Never    Tobacco comments:     Started smoking at 33,    Vaping Use    Vaping status: Some Days   Substance Use Topics    Alcohol use: No     Alcohol/week: 0.0 standard drinks of alcohol    Drug use: Yes     Types: Marijuana     Comment: once a month       Family History:  Family History   Problem Relation Age of Onset    Diabetes Mother         type 2    Breast cancer Mother     Diabetes Father  type 2    Cataracts Father     Macular degeneration Neg Hx     Retinal detachment Neg Hx     Colon cancer Neg Hx     Endometrial cancer Neg Hx     Ovarian cancer Neg Hx     Glaucoma Neg Hx     Blindness Neg Hx            Objective   Vitals:    12/26/22 1455   BP: 111/68   BP Site: L Arm   BP Position: Sitting   BP Cuff Size: Medium   Pulse: 78   Temp: 36.7 ??C (98.1 ??F)   TempSrc: Temporal   Weight: 58.1 kg (128 lb)           Physical Exam:  General: Well-appearing.  HEENT: Sclera anicteric;   Pulmonary: Unremarkable respiratory effort.   Neuro: Moving all four extremities spontaneously, able to walk to the exam table independently   Psych: Euthymic affect.  MSK:     Hands/Wrists: Swelling with bogginess/warmth of L wrist and 2-3 MCP as well as R 2nd MCP and 3rd PIP. Otherwise no bogginess/warmth of any hand joint. Hand grip weaker on L and than R.  Elbows: No swelling or tenderness to palpation of the elbows.  Shoulders: No swelling or tenderness to palpation of the bilateral shoulders.  Knees: No swelling or tenderness to palpation of the bilateral knees.  Ankles: No swelling or tenderness to palpation of the bilateral ankles.  Feet: Tenderness to palpation of the lateral hips.   Skin: No evidence of erythema or rash.  Extremities: Warm, no LE edema.    I personally spent 31 minutes face-to-face and non-face-to-face in the care of this patient, which includes all pre, intra, and post visit time on the date of service.  All documented time was specific to the E/M visit and does not include any procedures that may have been performed.

## 2022-12-27 NOTE — Unmapped (Signed)
3rd attempt-Unfortunately the admin team is unable to complete this request due to the following reason: unable to contact after 3 attempt(phone,text and mychart)

## 2022-12-29 DIAGNOSIS — M0579 Rheumatoid arthritis with rheumatoid factor of multiple sites without organ or systems involvement: Principal | ICD-10-CM

## 2023-01-09 NOTE — Unmapped (Signed)
[facility] Refill Coordination Note    Specialty Lite Medication(s) to be Shipped:   methotrexate    Other medication(s) to be shipped: No additional medications requested for fill at this time     Vernie Ammons, DOB: 03-30-1956  Phone: [phone] (home)       All above HIPAA information was verified with patient.     Was a Nurse, learning disability used for this call? No    Changes to medications: Nakita reports no changes at this time.  Changes to insurance: No      REFERRAL TO PHARMACIST     Referral to the pharmacist: Not needed      [facility]     Shipping address confirmed in Epic.     Delivery Scheduled: Yes, Expected medication delivery date: 08/27.     Medication will be delivered via UPS to the prescription address in Epic WAM.    Antonietta Barcelona   [facility] Specialty Technician

## 2023-01-13 MED FILL — RASUVO (PF) 25 MG/0.5 ML SUBCUTANEOUS AUTO-INJECTOR: SUBCUTANEOUS | 28 days supply | Qty: 2 | Fill #1

## 2023-01-16 NOTE — Unmapped (Signed)
[facility] - PHARMACIST NOTES    Rituximab infusion not scheduled as of today.  Reached out to follow up with patient. She is not aware her infusion is due. Provided contact to [facility] - pt will call to set up infusion appointments.     Bethany Meyer

## 2023-01-19 DIAGNOSIS — M0579 Rheumatoid arthritis with rheumatoid factor of multiple sites without organ or systems involvement: Principal | ICD-10-CM

## 2023-01-23 DIAGNOSIS — G5602 Carpal tunnel syndrome, left upper limb: Principal | ICD-10-CM

## 2023-01-23 DIAGNOSIS — M069 Rheumatoid arthritis, unspecified: Principal | ICD-10-CM

## 2023-01-23 MED ORDER — PREDNISONE 10 MG TABLET
ORAL_TABLET | ORAL | 0 refills | 58 days
Start: 2023-01-23 — End: 2023-03-21

## 2023-01-23 NOTE — Unmapped (Signed)
If pt is having flare pt needs to contact provider first

## 2023-02-12 DIAGNOSIS — M069 Rheumatoid arthritis, unspecified: Principal | ICD-10-CM

## 2023-02-12 DIAGNOSIS — R1084 Generalized abdominal pain: Principal | ICD-10-CM

## 2023-02-12 DIAGNOSIS — G5602 Carpal tunnel syndrome, left upper limb: Principal | ICD-10-CM

## 2023-02-12 DIAGNOSIS — F32A Depression, unspecified depression type: Principal | ICD-10-CM

## 2023-02-12 MED ORDER — PREDNISONE 10 MG TABLET
ORAL_TABLET | ORAL | 0 refills | 58 days | Status: CP
Start: 2023-02-12 — End: 2023-04-10

## 2023-02-12 MED ORDER — DICYCLOMINE 20 MG TABLET
ORAL_TABLET | Freq: Four times a day (QID) | ORAL | 3 refills | 90 days | PRN
Start: 2023-02-12 — End: 2024-02-12

## 2023-02-12 MED ORDER — BUPROPION HCL XL 150 MG 24 HR TABLET, EXTENDED RELEASE
ORAL_TABLET | Freq: Every morning | ORAL | 0 refills | 90 days | Status: CP
Start: 2023-02-12 — End: ?

## 2023-02-12 NOTE — Unmapped (Signed)
The PAC has received an incoming call requesting medication refill/clarification/question:    Caller: Talisa Derryberry  Best callback number: [phone]    Type of request (refill, clarification, question): refill    Name of medication: predniSONE (DELTASONE) 10 MG tablet    buPROPion (WELLBUTRIN XL) 150 MG 24 hr tablet     dicyclomine (BENTYL) 20 mg tablet       Is there a preferred amount requested (e.g. 30 pills, 3 months worth - if no leave blank):   Desired pharmacy: [id number] - [place], [place] - [address]  [address]  [place] [address]  Phone: [phone] Fax: [phone]    Does caller request a callback from a nurse to discuss?: no    Jcano  02/12/23

## 2023-02-17 NOTE — Unmapped (Signed)
[facility] Refill Coordination Note    Specialty Lite Medication(s) to be Shipped:   methotrexate    Other medication(s) to be shipped: No additional medications requested for fill at this time     Bethany Meyer, DOB: 1955/08/09  Phone: [phone] (home)       All above HIPAA information was verified with patient.     Was a Nurse, learning disability used for this call? No    Changes to medications: [name] reports no changes at this time.  Changes to insurance: No      REFERRAL TO PHARMACIST     Referral to the pharmacist: Not needed      [facility]     Shipping address confirmed in Epic.     Delivery Scheduled: Yes, Expected medication delivery date: 10/2.     Medication will be delivered via UPS to the prescription address in Epic WAM.    Gaspar Cola Shared [facility] Specialty Technician

## 2023-02-18 MED FILL — RASUVO (PF) 25 MG/0.5 ML SUBCUTANEOUS AUTO-INJECTOR: SUBCUTANEOUS | 28 days supply | Qty: 2 | Fill #2

## 2023-02-20 NOTE — Unmapped (Addendum)
LM4P  ----- Message from Laural Benes, [place] sent at 02/20/2023  1:30 PM EDT -----  Needs pcp follow up with me thanks

## 2023-02-21 NOTE — Unmapped (Signed)
Internal Medicine Karrie Doffing, PA)  02/27/2023 at 3:10 PM

## 2023-02-27 ENCOUNTER — Ambulatory Visit: Admit: 2023-02-27 | Discharge: 2023-02-28 | Payer: MEDICARE

## 2023-02-27 DIAGNOSIS — F32A Depression, unspecified depression type: Principal | ICD-10-CM

## 2023-02-27 DIAGNOSIS — M48062 Spinal stenosis, lumbar region with neurogenic claudication: Principal | ICD-10-CM

## 2023-02-27 DIAGNOSIS — R1084 Generalized abdominal pain: Principal | ICD-10-CM

## 2023-02-27 DIAGNOSIS — M069 Rheumatoid arthritis, unspecified: Principal | ICD-10-CM

## 2023-02-27 DIAGNOSIS — M05771 Rheumatoid arthritis with rheumatoid factor of right ankle and foot without organ or systems involvement: Principal | ICD-10-CM

## 2023-02-27 DIAGNOSIS — M05772 Rheumatoid arthritis with rheumatoid factor of left ankle and foot without organ or systems involvement: Principal | ICD-10-CM

## 2023-02-27 DIAGNOSIS — L84 Corns and callosities: Principal | ICD-10-CM

## 2023-02-27 DIAGNOSIS — M5431 Sciatica, right side: Principal | ICD-10-CM

## 2023-02-27 MED ORDER — DICYCLOMINE 20 MG TABLET
ORAL_TABLET | Freq: Four times a day (QID) | ORAL | 3 refills | 90 days | Status: CP | PRN
Start: 2023-02-27 — End: 2024-02-27

## 2023-02-27 MED ORDER — DULOXETINE 30 MG CAPSULE,DELAYED RELEASE
ORAL_CAPSULE | Freq: Every day | ORAL | 3 refills | 90 days | Status: CP
Start: 2023-02-27 — End: 2024-02-27

## 2023-02-27 MED ORDER — HYDROXYZINE HCL 10 MG TABLET
ORAL_TABLET | Freq: Four times a day (QID) | ORAL | 1 refills | 68 days | Status: CP | PRN
Start: 2023-02-27 — End: ?

## 2023-02-27 MED ORDER — TRAMADOL 50 MG TABLET
ORAL_TABLET | Freq: Four times a day (QID) | ORAL | 0 refills | 8 days | Status: CP | PRN
Start: 2023-02-27 — End: ?

## 2023-02-27 MED ORDER — GABAPENTIN 300 MG CAPSULE
ORAL_CAPSULE | 3 refills | 0 days | Status: CP
Start: 2023-02-27 — End: ?

## 2023-02-27 NOTE — Unmapped (Signed)
Internal Medicine Clinic    Return Visit    Face to Face    Assessment and Plan:     Diagnosis ICD-10-CM Associated Orders   1. Depression, unspecified depression type  F32.A DULoxetine (CYMBALTA) 30 MG capsule      2. Generalized abdominal pain  R10.84 dicyclomine (BENTYL) 20 mg tablet      3. Sciatica of right side  M54.31 gabapentin (NEURONTIN) 300 MG capsule      4. Rheumatoid arthritis, involving unspecified site, unspecified whether rheumatoid factor present (CMS-HCC)  M06.9       5. Rheumatoid arthritis involving both feet with positive rheumatoid factor (CMS-HCC)  M05.771     M05.772       6. Spinal stenosis of lumbar region with neurogenic claudication  M48.062 XR Lumbar Spine 2 or 3 Views      7. Foot callus  L84 Ambulatory referral to Podiatry          HCM covid and flu shots today    RA followed by Rheum    Insomnia and depression  Phq9 10 no SI  Begin duloxetine  Reassess 8 weeks for titration  Consider quetiapine in future    Pain control  Continue gabapentin  Add tramadol for severe RA  pain    Spine stenosis  Symptoms much better after surgery  Xray lumbar eval hardware    Foot callus  Referral to [facility] central foot [place]    Fu 2 mo    I spent a total of 35 min total including pre intra and post visit activities    Medication adherence and barriers to the treatment plan have been addressed. Opportunities to optimize healthy behaviors have been discussed. Patient / caregiver voiced understanding.      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    Paraesophageal hernia 05/08/2019    Spinal stenosis of lumbar region with neurogenic claudication 09/18/2018    Spondylolisthesis of lumbar region 09/18/2018    Piriformis syndrome of both sides 03/11/2018 Stage 1 mild COPD by GOLD classification (CMS-HCC) 02/25/2017    Rheumatoid arthritis involving both feet (CMS-HCC)     Immunosuppressed status (CMS-HCC) 01/27/2016    Osteomyelitis (CMS-HCC) 01/26/2016    Atrial flutter (CMS-HCC) 12/25/2015    Rheumatoid arthritis (CMS-HCC) 01/11/2015    Anxiety 02/10/2013    Depression 02/10/2013    Functional Abdominal pain 08/29/2012    Gastritis 08/26/2012    GERD (gastroesophageal reflux disease) 08/24/2012    Tobacco use disorder 01/01/2011       HPI:  Bethany Meyer is a pleasant 67 yo female here for follow up.    Today she notes only taking gabapentin and prednisone aside from her infusions    Would like hydroxyzine again to help with itching and something for sleep, pain    Would like stomach pill again       Review of Systems -  HPI    Exam:  BP 117/72 (BP Site: L Arm, BP Position: Sitting)  - Pulse 82  - Temp 36.6 ??C (97.8 ??F) (Oral)  - Resp 16  - Wt 57.8 kg (127 lb 6.4 oz)  - LMP 05/20/2005  - SpO2 96%  - BMI 20.56 kg/m??       Gen:  Pleasant in NAD  Negative bl SLR  Strength is 5/5 bl LE  Gait normal  CV RRR no mrg  Lungs coase breath sounds no wheeze or crackles  Feet left with two firm tender calluses underside and R with medial bunion, nontender      Laural Benes, PA-C  Supervising physician Gabrielle Dare, MD

## 2023-02-27 NOTE — Unmapped (Signed)
Good to see you  Start duloxetine for mood, sleep and pain  Take it once a day  Can take several weeks to be come effective  We will  follow up in 8 weeks  If not helping enough then we could make some changes

## 2023-03-02 DIAGNOSIS — M0579 Rheumatoid arthritis with rheumatoid factor of multiple sites without organ or systems involvement: Principal | ICD-10-CM

## 2023-03-13 DIAGNOSIS — M069 Rheumatoid arthritis, unspecified: Principal | ICD-10-CM

## 2023-03-13 DIAGNOSIS — G5602 Carpal tunnel syndrome, left upper limb: Principal | ICD-10-CM

## 2023-03-13 MED ORDER — PREDNISONE 10 MG TABLET
ORAL_TABLET | ORAL | 0 refills | 0 days
Start: 2023-03-13 — End: ?

## 2023-03-14 MED ORDER — PREDNISONE 10 MG TABLET
ORAL_TABLET | ORAL | 0 refills | 58 days
Start: 2023-03-14 — End: 2023-05-10

## 2023-03-19 ENCOUNTER — Ambulatory Visit: Admit: 2023-03-19 | Payer: MEDICARE

## 2023-03-24 DIAGNOSIS — M0579 Rheumatoid arthritis with rheumatoid factor of multiple sites without organ or systems involvement: Principal | ICD-10-CM

## 2023-04-02 ENCOUNTER — Ambulatory Visit: Admit: 2023-04-02 | Payer: MEDICARE

## 2023-04-02 DIAGNOSIS — M069 Rheumatoid arthritis, unspecified: Principal | ICD-10-CM

## 2023-04-02 DIAGNOSIS — G5602 Carpal tunnel syndrome, left upper limb: Principal | ICD-10-CM

## 2023-04-02 MED ORDER — FOLIC ACID 1 MG TABLET
ORAL_TABLET | Freq: Every day | ORAL | 3 refills | 0 days
Start: 2023-04-02 — End: ?

## 2023-04-02 MED ORDER — PREDNISONE 10 MG TABLET
ORAL_TABLET | ORAL | 0 refills | 0 days
Start: 2023-04-02 — End: ?

## 2023-04-09 ENCOUNTER — Ambulatory Visit
Admit: 2023-04-09 | Discharge: 2023-04-10 | Payer: MEDICARE | Attending: Student in an Organized Health Care Education/Training Program | Primary: Student in an Organized Health Care Education/Training Program

## 2023-04-09 DIAGNOSIS — M069 Rheumatoid arthritis, unspecified: Principal | ICD-10-CM

## 2023-04-09 LAB — CBC W/ AUTO DIFF
BASOPHILS ABSOLUTE COUNT: 0 10*9/L (ref 0.0–0.1)
BASOPHILS RELATIVE PERCENT: 0.7 %
EOSINOPHILS ABSOLUTE COUNT: 0.1 10*9/L (ref 0.0–0.5)
EOSINOPHILS RELATIVE PERCENT: 1.7 %
HEMATOCRIT: 36.3 % (ref 34.0–44.0)
HEMOGLOBIN: 12.4 g/dL (ref 11.3–14.9)
LYMPHOCYTES ABSOLUTE COUNT: 1.6 10*9/L (ref 1.1–3.6)
LYMPHOCYTES RELATIVE PERCENT: 21.1 %
MEAN CORPUSCULAR HEMOGLOBIN CONC: 34.2 g/dL (ref 32.0–36.0)
MEAN CORPUSCULAR HEMOGLOBIN: 32.5 pg — ABNORMAL HIGH (ref 25.9–32.4)
MEAN CORPUSCULAR VOLUME: 95.1 fL (ref 77.6–95.7)
MEAN PLATELET VOLUME: 7.3 fL (ref 6.8–10.7)
MONOCYTES ABSOLUTE COUNT: 0.7 10*9/L (ref 0.3–0.8)
MONOCYTES RELATIVE PERCENT: 8.6 %
NEUTROPHILS ABSOLUTE COUNT: 5.2 10*9/L (ref 1.8–7.8)
NEUTROPHILS RELATIVE PERCENT: 67.9 %
PLATELET COUNT: 380 10*9/L (ref 150–450)
RED BLOOD CELL COUNT: 3.82 10*12/L — ABNORMAL LOW (ref 3.95–5.13)
RED CELL DISTRIBUTION WIDTH: 14.7 % (ref 12.2–15.2)
WBC ADJUSTED: 7.6 10*9/L (ref 3.6–11.2)

## 2023-04-09 LAB — HEPATIC FUNCTION PANEL
ALBUMIN: 3 g/dL — ABNORMAL LOW (ref 3.4–5.0)
ALKALINE PHOSPHATASE: 78 U/L (ref 46–116)
ALT (SGPT): 7 U/L — ABNORMAL LOW (ref 10–49)
AST (SGOT): 17 U/L (ref <=34)
BILIRUBIN DIRECT: <0.1 mg/dL (ref 0.00–0.30)
BILIRUBIN TOTAL: 0.2 mg/dL — ABNORMAL LOW (ref 0.3–1.2)
PROTEIN TOTAL: 7.4 g/dL (ref 5.7–8.2)

## 2023-04-09 LAB — CREATININE
CREATININE: 0.71 mg/dL (ref 0.55–1.02)
EGFR CKD-EPI (2021) FEMALE: >90 mL/min/{1.73_m2} (ref >=60)

## 2023-04-09 LAB — C-REACTIVE PROTEIN: C-REACTIVE PROTEIN: 14 mg/L — ABNORMAL HIGH (ref <=10.0)

## 2023-04-09 LAB — SEDIMENTATION RATE: ERYTHROCYTE SEDIMENTATION RATE: 117 mm/h — ABNORMAL HIGH (ref 0–30)

## 2023-04-09 MED ORDER — FOLIC ACID 1 MG TABLET
ORAL_TABLET | Freq: Every day | ORAL | 3 refills | 90.00000 days | Status: CP
Start: 2023-04-09 — End: 2023-04-09
  Filled 2023-06-19: qty 90, 90d supply, fill #0

## 2023-04-09 MED ORDER — TOCILIZUMAB 162 MG/0.9 ML SUBCUTANEOUS PEN INJECTOR
SUBCUTANEOUS | 3 refills | 0 days | Status: CP
Start: 2023-04-09 — End: ?
  Filled 2023-06-19: qty 3.6, 28d supply, fill #0

## 2023-04-09 MED ORDER — PREDNISONE 10 MG TABLET
ORAL_TABLET | ORAL | 0 refills | 58 days | Status: CP
Start: 2023-04-09 — End: 2023-06-06

## 2023-04-09 NOTE — Unmapped (Signed)
Assessment/Plan:   Bethany Meyer is a 67 y.o. 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    Seropositive (-RF, +CCP) non erosive RA   Unfortunately with ongoing disease activity again in setting of not taking any medications. Stopped MTX because she couldn't get folic acid from [facility] and multiple missed/canceled infusions (9 since 2022). We discussed today that due to barriers with keeping infusions appointments, will re-explore an at home biologic. As she has tried 2 TNFis and Xeljanz, we discussed IL6 blockade today. After reviewing dosing and adverse effect profile, she is amenable.  - Restart MTX 25mg  SQ  ---- Continue FA daily (Sent to shared service, she has had issues with this at [facility])  - Start Actemra SQ qweekly  - pred 20mg  x7d, 15mg  x7d, 10mg  x14d, 5mg  x14d (see PCP has sent this in earlier today, agree with this course)  - CBC w/ diff, Cr, hepatic panel, ESR, CRP    Swollen Joint Count (0-28): 9  Tender Joint Count (0-28): 12  Patient Global Assessment of Disease Activity (0-10): 9  Evaluator Global Assessment of Disease (0-10): 7  CDAI Score: 37  CDAI interpretation:  0.0-2.8 Remission   2.9-10.0 Low disease activity   10.1-22.0 Moderate disease activity   22.1-76.0 High disease activity       Spinal stenosis  Patient with an MRI from 12/24/19 demonstrating severe spinal canal stenosis and foraminal narrowing at L4-5. Now with dramatic improvement after spinal surgery.     Osteoporosis - Fall resulting in right proximal femur greater trochanteric fracture  Dexa 02/2021 with left hip T score -2.7 c/w osteoporosis. Started Fosamax but has self-d/c'd multiple times. Has not taken for a significant continuous period.   - Fosamax qweekly    Tobacco use  She was smoking at least a pack per day, now down to ~8/day.   -- smoking cessation encouraged     Normocytic anemia   --will check CBC today.     Health Care Maintenance:  Routine health maintenance discussed  Immunization History   Administered Date(s) Administered Comments    COVID-19 VAC,BIVALENT(23YR UP),PFIZER 05/03/2021      11/01/2021     COVID-19 VACCINE,MRNA(MODERNA)(PF) 06/29/2019 Historical - Not administered in Epic     07/27/2019 Historical - Not administered in Epic     04/07/2020     Covid-19 Vac, (15yr+) (Comirnaty) Mrna Pfizer  02/27/2023     Covid-19 Vac, (69yr+) (Spikevax) Monovalent Moderna 05/03/2022     DTP 12/22/1962 Historical - Not administered in Epic     02/04/1963 Historical - Not administered in Epic     07/22/1963 Historical - Not administered in Epic    HEPATITIS B VACCINE ADULT,IM(ENERGIX B, RECOMBIVAX) 01/03/2015      03/13/2015     Hepatitis B Vaccine, Unspecified Formulation 08/19/1996 Historical - Not administered in Epic     02/24/1997 Historical - Not administered in Epic    INFLUENZA IIV3 HIGH DOSE 51YRS+(FLUZONE) 02/27/2023     INFLUENZA QUAD HIGH DOSE 51YRS+(FLUZONE) 03/03/2019     Influenza Vaccine Quad(IM)6 MO-Adult(PF) 02/10/2013      05/03/2014      03/13/2015      03/20/2016      07/12/2017      03/11/2018      02/14/2020      05/03/2021      02/01/2022     Influenza Virus Vaccine, unspecified formulation 02/10/2013  05/03/2014      03/13/2015      03/20/2016      07/12/2017      03/11/2018     MMR 12/07/1998 Historical - Not administered in Epic     12/20/1998 Historical - Not administered in Epic     04/04/1999 Historical - Not administered in Epic    Meningococcal Conjugate MCV4P 05/03/2021     OPV 07/19/1962 Historical - Not administered in Epic     09/13/1962 Historical - Not administered in Epic     12/22/1962 Historical - Not administered in Epic    PNEUMOCOCCAL POLYSACCHARIDE 23-VALENT 01/11/2015     Pneumococcal Conjugate 20-valent 11/01/2021     Pneumococcal conjugate -PCV7 02/18/2012     TD(TDVAX),ADSORBED,2LF(IM)(PF) 12/07/1998 Historical - Not administered in Epic    TdaP 01/01/2011 Deferred Date(s) Deferred Comments    Influenza Vaccine Quad(IM)6 MO-Adult(PF) 05/17/2019      06/24/2019 Pt remember she had a dose already last year.  See immunization record.       Diagnoses and all orders for this visit:  Rheumatoid arthritis, involving unspecified site, unspecified whether rheumatoid factor present (CMS-HCC) [M06.9]  - CBC w/ Differential  - Creatinine  - Hepatic Function Panel  - CRP  C-Reactive Protein  - ESR Sed rate  - tocilizumab 162 mg/0.9 mL PnIj; Inject 162 mg under the skin once a week.  - folic acid (FOLVITE) 1 MG tablet; Take 1 tablet (1,000 mcg total) by mouth daily.              I appreciate the opportunity to participate and collaborate in the care of this patient.      History of Present Illness:     Primary Care Provider: Leda Min, PA    HPI:  Bethany Meyer is a 67 y.o.  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    Today  About 3 weeks ago she felt like her joints started getting more active. Mainly feet and L wrist. Swollen and stiff. Still on MTX 25mg  and tolearting well. Felt the Rituximab helped significantly. Is hoping for another wrist injection as the last one helped significantly.     As background  Ms. Hoke's joint pain first started around May/June 2016 with right knee pain and swelling, then right foot and ankle swelling. She is RF negative, CCP positive. She was started on methotrexate but had persistent joint pain and swelling so was started on Humira in March 2017. She is on methotrexate 20mg  weekly po (have been unable to get SQ approved by insurance).  She was increased to weekly Humira but continued to have joint pain and swelling.  Enbrel was not approved by insurance so prescription was sent for [name] instead in 2018. [name] was initially effective but the patient experienced several flares while on this medication. This was discontinued in May 2019 and she was started on Cimzia in June 2019. Cimzia was discontinued in summer 2020 due to lack of perceived benefit from medication. She was on Rituximab with first infusion 06/2019, she has had sporadic dosing schedule due to issues with transportation and following up.    Ritux 1g 06/2019, 12/30/19, 01/13/20, 12/20/20, 04/30/22. Due to multiple missed infusion appointments, we switched her to Actemra 03/2023. Rituximab was effective.    Balance of 10 systems was reviewed and is negative except for that mentioned in the HPI.    Review of  records: I have reviewed labs/images/clinic notes per the computerized medical record.   X-ray: IMPRESSION:  -Subtle cortical irregularity at the second middle phalanx at the PIP joint, which is only visualized on oblique views and may be artifactual. Recommend correlation with point tenderness if fracture in this region is suspected.    Allergies:  Patient has no known allergies.    Medications:     Current Outpatient Medications:     acetaminophen (TYLENOL) 325 MG tablet, Take 1 tablet (325 mg total) by mouth two (2) times a day as needed for pain., Disp: , Rfl:     arm brace Misc, Boxer splint, metacarpal wrist brace, Disp: 1 each, Rfl: 0    aspirin 81 MG chewable tablet, CHEW 1 TABLET BY MOUTH DAILY, Disp: 36 tablet, Rfl: 3    dicyclomine (BENTYL) 20 mg tablet, Take 1 tablet (20 mg total) by mouth four times a day as needed (stomach cramps)., Disp: 360 tablet, Rfl: 3    DULoxetine (CYMBALTA) 30 MG capsule, Take 1 capsule (30 mg total) by mouth daily., Disp: 90 capsule, Rfl: 3    empty container Misc, Use as directed, Disp: 1 each, Rfl: 2    gabapentin (NEURONTIN) 300 MG capsule, 3 capsules po morning and midday, 4 capsules po qhs, Disp: 360 capsule, Rfl: 3    hydrOXYzine (ATARAX) 10 MG tablet, Take 1 tablet (10 mg total) by mouth every six (6) hours as needed for itching., Disp: 270 tablet, Rfl: 1    leg brace (KNEE SUPPORT BRACE) Misc, 1 each by Miscellaneous route daily as needed., Disp: 1 each, Rfl: 0    methotrexate, PF, (RASUVO, PF,) 25 mg/0.5 mL [name], Inject the contents of 1 auto-injector (25 mg total) under the skin every seven (7) days., Disp: 2 mL, Rfl: 5    needle, disp, 25 gauge 25 gauge x 5/8 Ndle, For methotrexate injections, Disp: 100 each, Rfl: 0    predniSONE (DELTASONE) 10 MG tablet, TAKE 2 TABLETS (20 MG TOTAL) BY MOUTH DAILY FOR 7 DAYS, THEN 1.5 TABLETS (15 MG TOTAL) DAILY FOR 7 DAYS, THEN 1 TABLET (10 MG TOTAL) DAILY FOR 14 DAYS, THEN 0.5 TABLETS (5 MG TOTAL) DAILY., Disp: 54 tablet, Rfl: 0    traMADol (ULTRAM) 50 mg tablet, Take 1 tablet (50 mg total) by mouth every six (6) hours as needed for pain., Disp: 30 tablet, Rfl: 0    VITAMIN B-1 100 mg tablet, Take 1 tablet (100 mg total) by mouth daily., Disp: 100 tablet, Rfl: 3    folic acid (FOLVITE) 1 MG tablet, Take 1 tablet (1,000 mcg total) by mouth daily., Disp: 90 tablet, Rfl: 3    tocilizumab 162 mg/0.9 mL PnIj, Inject 162 mg under the skin once a week., Disp: 10.8 mL, Rfl: 3    Medical History:  Past Medical History:   Diagnosis Date    Arthritis     RA    Cataract     Functional dyspepsia     GERD (gastroesophageal reflux disease)     Gout     RA (rheumatoid arthritis) (CMS-HCC)     Smoking     Stage 1 mild COPD by GOLD classification (CMS-HCC) 02/25/2017       Surgical History:  Past Surgical History:   Procedure Laterality Date    ovarian cyst removed      OVARY SURGERY      PR COLONOSCOPY FLX DX W/COLLJ SPEC WHEN PFRMD N/A 03/05/2013    Procedure: COLONOSCOPY, FLEXIBLE, PROXIMAL TO  SPLENIC FLEXURE; DIAGNOSTIC, W/WO COLLECTION SPECIMEN BY BRUSH OR WASH;  Surgeon: Trudie Buckler, MD;  Location: GI PROCEDURES [facility];  Service: Gastroenterology    PR COMPRE EP EVAL ABLTJ 3D MAPG TX SVT N/A 12/26/2015    Procedure: A-Flutter Ablation;  Surgeon: Theresia Majors, MD;  Location: [facility] EP;  Service: Cardiology    PR UP GI ENDOSCOPY,BALL DIL,30MM N/A 06/03/2019    Procedure: UGI ENDO; W/BALLOON DILAT ESOPHAGUS (<30MM DIAM);  Surgeon: Neysa Hotter, MD;  [facility];  Service: Gastroenterology    PR UPPER GI ENDOSCOPY,BIOPSY N/A 09/16/2012    Procedure: UGI ENDOSCOPY; WITH BIOPSY, SINGLE OR MULTIPLE;  Surgeon: Brown Human, MD;  [facility]: [facility];  Service: Gastroenterology    PR UPPER GI ENDOSCOPY,BIOPSY N/A 06/03/2019    Procedure: UGI ENDOSCOPY; WITH BIOPSY, SINGLE OR MULTIPLE;  Surgeon: Neysa Hotter, MD;  Location: [facility] PROC[facility];  Service: Gastroenterology    PR UPPER GI ENDOSCOPY,DIAGNOSIS N/A 06/03/2019    Procedure: UGI ENDO, INCLUDE ESOPHAGUS, STOMACH, & DUODENUM &/OR JEJUNUM; DX W/WO COLLECTION SPECIMN, BY BRUSH OR WASH;  Surgeon: Neysa Hotter, MD;  Location: [facility];  Service: Gastroenterology    PR XCAPSL CTRC RMVL INSJ IO LENS PROSTH CPLX WO ECP Right 05/29/2022    Procedure: EXTRACAPSULAR CATARACT REMOVAL WITH INSERTION OF INTRAOCULAR LENS PROSTHESIS, COMPLEX WITHOUT ENDOSCOPIC CYCLOPHOTOCOAGULATION;  Surgeon: Garner Gavel, MD;  Location: [facility] OR [facility];  Service: Ophthalmology    PR XCAPSL CTRC RMVL INSJ IO LENS PROSTH W/O ECP Left 07/03/2022    Procedure: EXTRACAPSULAR CATARACT REMOVAL W/INSERTION OF INTRAOCULAR LENS PROSTHESIS, MANUAL OR MECHANICAL TECHNIQUE WITHOUT ENDOSCOPIC CYCLOPHOTOCOAGULATION;  Surgeon: Garner Gavel, MD;  Location: [facility] OR [facility];  Service: Ophthalmology       Social History:  Social History     Tobacco Use    Smoking status: Every Day     Current packs/day: 1.00     Average packs/day: 1 pack/day for 35.0 years (35.0 ttl pk-yrs)     Types: Cigarettes    Smokeless tobacco: Never    Tobacco comments:     Started smoking at 33,    Vaping Use    Vaping status: Some Days   Substance Use Topics    Alcohol use: No     Alcohol/week: 0.0 standard drinks of alcohol    Drug use: Yes     Types: Marijuana     Comment: once a month Family History:  Family History   Problem Relation Age of Onset    Diabetes Mother         type 2    Breast cancer Mother     Diabetes Father         type 2    Cataracts Father     Macular degeneration Neg Hx     Retinal detachment Neg Hx     Colon cancer Neg Hx     Endometrial cancer Neg Hx     Ovarian cancer Neg Hx     Glaucoma Neg Hx     Blindness Neg Hx            Objective   Vitals:    04/09/23 0953   BP: 128/78   BP Site: L Arm   BP Position: Sitting   BP Cuff Size: Small   Pulse: 83   Temp: 36.6 ??C (97.9 ??F)   TempSrc: Temporal   Weight: 54.6  kg (120 lb 6.4 oz)             Physical Exam:  General: Well-appearing.  HEENT: Sclera anicteric;   Pulmonary: Unremarkable respiratory effort.   Neuro: Moving all four extremities spontaneously, able to walk to the exam table independently   Psych: Euthymic affect.  MSK:     Hands/Wrists: Swelling with bogginess/warmth of L wrist and 2-3 MCP and 2-4 PIPs as well as R 2nd MCP and 3rd PIP. Otherwise no bogginess/warmth of any hand joint. Hand grip weaker on L and than R.  Elbows: No swelling or tenderness to palpation of the elbows.  Shoulders: No swelling or tenderness to palpation of the bilateral shoulders.  Knees: No swelling or tenderness to palpation of the bilateral knees.  Ankles: No swelling or tenderness to palpation of the bilateral ankles.  Feet: Tenderness to palpation of the lateral hips.   Skin: No evidence of erythema or rash.  Extremities: Warm, no LE edema.    I personally spent 33 minutes face-to-face and non-face-to-face in the care of this patient, which includes all pre, intra, and post visit time on the date of service.  All documented time was specific to the E/M visit and does not include any procedures that may have been performed.

## 2023-04-09 NOTE — Unmapped (Signed)
Tocilizumab (actemra) (Information from the [employer] of Rheumatology)    Tocilizumab (Actemra ??) is a biologic medication currently approved to treat adults with moderately to severely active rheumatoid arthritis (RA), adults with giant cell arteritis (GCA), and people ages two and above with Polyarticular Juvenile Idiopathic Arthritis (PJIA) or Systemic Juvenile Idiopathic Arthritis (SJIA). Biologic medications are proteins designed by humans that affect the immune system. Tocilizumab blocks the inflammatory protein IL-6. This improves joint pain and swelling from arthritis and other symptoms caused by inflammation.    How to Take It    For adults with RA or GCA, tocilizumab can be given as either a monthly intravenous infusion or as a subcutaneous injection every one - two weeks. For people with PJIA or SJIA, tocilizumab is given as a monthly intravenous infusion. When used as a subcutaneous injection, the medicine can be injected into the thigh or abdomen. The site of injection should be rotated so the same site is not used multiple times. Some patients will start to see improvement within a few weeks, but it may take several months to take full effect. Tocilizumab may be taken alone or with methotrexate or other non-biologic drugs. Tocilizumab should not be given in combination with another biologic drug. Blood tests will be used to monitor for increases in cholesterol or liver enzymes and for reductions in blood cell counts while taking tocilizumab.    Side Effects    Tocilizumab can lower the ability of your immune system to fight infections. If you develop symptoms of an infection while using this medication, you should stop it and contact your doctor. All patients should be tested for tuberculosis before starting on tocilizumab, although these types of infections have not been frequently seen. Allergic reactions to intravenous tocilizumab infusions can occur, which can include symptoms such as fever and chills, but these are rare. Tocilizumab has been associated with increased cholesterol levels in some patients, and should be periodically monitored. If your cholesterol level becomes too high, it is possible you may need to start taking a medication to lower it. A rare complication seen with tocilizumab use in clinical trials was bowel perforation, or a hole in the bowel wall. If you have a history or diverticulitis or develop abdominal pain or bloody bowel movements while taking tocilizumab, you should notify your doctor immediately.    Tell Your Doctor    You should notify your doctor if you develop symptoms of an infection, such as a fever or cough, or if you think you are having any side effects, especially abdominal pain, bloody bowel movements, or allergic reactions. Notify your doctor if you become pregnant, are planning pregnancy, or if you are breastfeeding. Be sure to talk with your doctor if you are planning to have surgery or if you plan to get any live vaccinations; these include the shingles vaccine, the nasal spray flu vaccine, and others such as the measles, mumps, rubella, and yellow fever vaccines.    Updated July 2017 by Cherylann Parr, MD and reviewed by the [facility] of Rheumatology Committee on Communications and Marketing.    This information is provided for general education only. Individuals should consult a qualified health care provider for professional medical advice, diagnosis and treatment of a medical or health condition.    ?? 2017 [employer]

## 2023-04-10 NOTE — Unmapped (Signed)
[facility] Specialty Medication Onboarding    Specialty Medication: Actemra  Prior Authorization: Approved   Financial Assistance: No - copay  <$25  Final Copay/Day Supply: $11.20 / 28 days    Insurance Restrictions: Yes - max 1 month supply     Notes to Pharmacist:   Credit Card on File: no    The triage team has completed the benefits investigation and has determined that the patient is able to fill this medication at [facility]. Please contact the patient to complete the onboarding or follow up with the prescribing physician as needed.

## 2023-04-10 NOTE — Unmapped (Addendum)
 The [facility] has made a fourth and final attempt to reach this patient to refill the following medication:ACTEMRA ONBOARDING.      We have left voicemails on the following phone numbers: [phone] and have sent a text message to the following phone numbers: [phone] .  Patient has not logged into MyChart since 2018     Dates contacted: 11/21, 11/25, 12/2, 12/9, 12/24 (returning patient's VM)  Last scheduled delivery: Unable to onboard    The patient may be at risk of non-compliance with this medication. The patient should call the [facility] at [phone]  Option 4, then Option 2: Dermatology, Gastroenterology, Rheumatology to refill medication.    Julianne Rice, PharmD   [facility] Specialty Pharmacist

## 2023-04-24 NOTE — Unmapped (Signed)
She called and left a message that states she has not gotten results from the prednisone and she wanted to know if there is anything stronger you can order.  Other rec?

## 2023-04-29 ENCOUNTER — Ambulatory Visit: Admit: 2023-04-29 | Discharge: 2023-04-30 | Payer: MEDICARE

## 2023-04-29 DIAGNOSIS — F17200 Nicotine dependence, unspecified, uncomplicated: Principal | ICD-10-CM

## 2023-04-29 DIAGNOSIS — J449 Chronic obstructive pulmonary disease, unspecified: Principal | ICD-10-CM

## 2023-04-29 DIAGNOSIS — Z87891 Personal history of nicotine dependence: Principal | ICD-10-CM

## 2023-04-29 DIAGNOSIS — Z1211 Encounter for screening for malignant neoplasm of colon: Principal | ICD-10-CM

## 2023-04-29 DIAGNOSIS — D849 Immunodeficiency, unspecified: Principal | ICD-10-CM

## 2023-04-29 DIAGNOSIS — G5602 Carpal tunnel syndrome, left upper limb: Principal | ICD-10-CM

## 2023-04-29 DIAGNOSIS — Z122 Encounter for screening for malignant neoplasm of respiratory organs: Principal | ICD-10-CM

## 2023-04-29 DIAGNOSIS — M069 Rheumatoid arthritis, unspecified: Principal | ICD-10-CM

## 2023-04-29 DIAGNOSIS — G4700 Insomnia, unspecified: Principal | ICD-10-CM

## 2023-04-29 MED ORDER — PREDNISONE 10 MG TABLET
ORAL_TABLET | ORAL | 0 refills | 11.00 days | Status: CP
Start: 2023-04-29 — End: 2023-06-26

## 2023-04-29 MED ORDER — BUDESONIDE-FORMOTEROL HFA 80 MCG-4.5 MCG/ACTUATION AEROSOL INHALER
Freq: Two times a day (BID) | RESPIRATORY_TRACT | 2 refills | 21.00 days | Status: CP
Start: 2023-04-29 — End: 2023-04-29

## 2023-04-29 MED ORDER — QUETIAPINE 25 MG TABLET
ORAL_TABLET | 2 refills | 0.00 days | Status: CP
Start: 2023-04-29 — End: ?

## 2023-04-29 MED ORDER — TRAMADOL 50 MG TABLET
ORAL_TABLET | Freq: Three times a day (TID) | ORAL | 2 refills | 30.00 days | Status: CP | PRN
Start: 2023-04-29 — End: ?

## 2023-04-29 MED ORDER — UMECLIDINIUM 62.5 MCG/ACTUATION BLISTER POWDER FOR INHALATION
Freq: Every day | RESPIRATORY_TRACT | 2 refills | 30.00 days | Status: CP
Start: 2023-04-29 — End: ?

## 2023-04-29 NOTE — Unmapped (Signed)
Internal Medicine Clinic    Return Visit    Face to Face    Assessment and Plan:     Diagnosis ICD-10-CM Associated Orders   1. Personal history of tobacco use, presenting hazards to health  Z87.891 CT Lung Cancer Screening Baseline or Annual Low Dose      2. Screening for lung cancer  Z12.2       3. Colon cancer screening  Z12.11 Colonoscopy      4. Stage 2 moderate COPD by GOLD classification (CMS-HCC)  J44.9 umeclidinium (INCRUSE ELLIPTA) 62.5 mcg/actuation inhaler      5. Immunosuppressed status (CMS-HCC)  D84.9       6. Insomnia, unspecified type  G47.00 QUEtiapine (SEROQUEL) 25 MG tablet      7. Tobacco use disorder  F17.200       8. Rheumatoid arthritis, involving unspecified site, unspecified whether rheumatoid factor present (CMS-HCC)  M06.9 Ambulatory referral to Pain Clinic     predniSONE (DELTASONE) 10 MG tablet      9. Carpal tunnel syndrome of left wrist  G56.02 predniSONE (DELTASONE) 10 MG tablet          HCM colon cancer screening and lung cancer screening reviewed today.  Colonoscopy and ct low dose screen annual ordered    RA followed by Rheum  Active disease inflammation  She is on a prednisone taper with elevated inflammatory markers 11/20 and joint swelling on exam today  Currently on 10 mg of her prednisone taper, methotrexate weekly, awaiting actemra  Will increase prednisone back to 15 mg daily for 2 weeks, then 10 mg for 2 weeks then 5 mg x 30 days    Insomnia and depression  Phq9 8 no SI improved from 10  continue duloxetine 30 mg daily  Insomnia not well controlled  Add quetiapine, has been difficult to control    Pain control  Continue gabapentin  Increase tramadol dose to 2 po q 8 hrs prn pain  [place] Controlled Substance Database was reviewed without abnormality  Referral to [facility] pain stressed importance of this    COPD  CAT>2  Would like to try LAMA   Add spirva daily we reviewed this  She will work on smoking cessation and decreasing tobacco use.  Uncontrolled pain and sleep make quitting a difficult time  CTM    Meal supplementation  Care management message sent regarding nutritional supplement drink support    Fu 10 weeks    I spent a total of 45 min total including pre intra and post visit activities    Medication adherence and barriers to the treatment plan have been addressed. Opportunities to optimize healthy behaviors have been discussed. Patient / caregiver voiced understanding.      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    Paraesophageal hernia 05/08/2019    Spinal stenosis of lumbar region with neurogenic claudication 09/18/2018    Spondylolisthesis of lumbar region 09/18/2018    Piriformis syndrome of both sides 03/11/2018    Stage 1 mild COPD by GOLD classification (CMS-HCC) 02/25/2017    Rheumatoid arthritis involving both feet (CMS-HCC)     Immunosuppressed status (CMS-HCC) 01/27/2016    Osteomyelitis (CMS-HCC) 01/26/2016    Atrial flutter (CMS-HCC) 12/25/2015    Rheumatoid arthritis (CMS-HCC)  01/11/2015    Anxiety 02/10/2013    Depression 02/10/2013    Functional Abdominal pain 08/29/2012    Gastritis 08/26/2012    GERD (gastroesophageal reflux disease) 08/24/2012    Tobacco use disorder 01/01/2011       HPI:  Bethany Meyer is a pleasant 67 yo female here for follow up.    Today she notes only taking gabapentin and prednisone currently 10 mg daily.  Taking methotrexate weekly.  Waiting on new med rheumatology ordered.    Pain in wrists and foot is severe at times and they swell.  She is unable to sleep well.  Often in a recliner.  Has longstanding insomnia    Taking duloxetine everyday.  Maybe helping some with mood    Still smoking and things she smokes too much because she can't sleep well and is in pain    Tramadol doesn't really help    Takes gabapentin 3 three times daily    Breathing is ok, coughs up mucus daily and knows she needs to quit         Review of Systems -  HPI    Exam:  BP 119/74 (BP Position: Sitting, BP Cuff Size: Small)  - Pulse 72  - Temp 36.3 ??C (97.4 ??F) (Temporal)  - Ht 168.9 cm (5' 6.5)  - Wt 56.5 kg (124 lb 9.6 oz)  - LMP 05/20/2005  - SpO2 94%  - BMI 19.81 kg/m??   Gen:  Pleasant in NAD  CV RRR no mrg  Lungs coase breath sounds no wheeze or crackles  Feet left with two firm tender calluses underside and R with medial bunion, nontender  Msk:  left wrist swelling with tenderness  No redness or warmth  Laural Benes, PA-C  Supervising physician Gabrielle Dare, MD

## 2023-04-29 NOTE — Unmapped (Addendum)
Stop quetiapine (seroquel for sleep at night)  Increase prednisone back to 15 mg daily for 2 weeks, then 10 mg for 2 weeks, then 5 mg for 30 days  Start Incruse for COPD (review how to use with your pharmacist)  Increase tramadol to 2 tablets po three times daily as needed  Referring you to pain management here to meet Lerry Liner

## 2023-04-29 NOTE — Unmapped (Signed)
[facility] at [facility]     Reason for visit: Follow up      Screening BP- 119/74 73          HCDM reviewed and updated in Epic:    We are working to make sure all of our patients??? wishes are updated in Epic and part of that is documenting a Environmental health practitioner for each patient  A Health Care Decision Maker is someone you choose who can make health care decisions for you if you are not able - who would you most want to do this for you????  was updated.    HCDM (patient stated preference): [place] - Daughter - [phone]    HCDM (patient stated preference): [employer] - Sister - [phone]    BPAs completed:  PHQ9    Annual Screenings:   SDOH , Tobacco, Audit/Alcohol , Substance Use, Domestic Abuse, and Health Literacy  __________________________________________________________________________________________

## 2023-05-06 NOTE — Unmapped (Signed)
[facility] Internal Medicine   CARE MANAGEMENT ENCOUNTER           Date of Service:  05/06/2023      Service:  Care Coordination - phone  Is there someone else in the room? No.   MyChart use by patient is active: no    Post-outreach Action Items:  Provider: N/A.  CM:  If no callback is received, call patient again in 1 week; send letter with resource info if no answer.  Patient:  Look into resources noted below.    Purpose of contact:     Care Manager (CM) received request for assisting patient with resources for supplemental nutrition    Care Manager (CM) completed the following related to the above request:  Reviewed chart  [facility] Medicare Advantage  Identified resources   [facility] has coupons for supplemental nutrition to provide to patient  [facility] has a supply of donated supplemental nutrition drinks to provide to patients  [facility]         Joyce Copa [facility]         [address], [place], [place] [address]         Phone: [phone]          Hours: Monday - Friday, 8 am - 4 pm; Evening activities begin at 6 pm, Monday    - Thursday          Website: [email or web address]  Some Medicare Advantage plans have an over-the-counter allowance that patients can use to buy supplemental nutrition.  Contacted Merridith Schissler via phone, but call went to voicemail. Left message requesting callback.      I provided an intervention for the [employer] and Food Insecurity SDOH domain. The intervention was Provided [employer]     A copy of this Patient Outreach Encounter was sent to patient's Primary Care Provider                             [facility]   Joyce Copa [facility]   [address], [place], [place] [address]   Phone: [phone]    Hours: Monday - Friday, 8 am - 4 pm; Evening activities begin at 6 pm, Monday - Thursday    Website: [email or web address]       [facility]:   [address], [address] [place], [place] [address]     Phone: [phone]   Fax: [phone]   Hours: Monday through Friday: 8 AM to 5 PM, closed on Saturday & Sunday     Website: [email or web address]

## 2023-05-06 NOTE — Unmapped (Signed)
[facility] has coupons for supplemental nutrition to provide to patients  [facility] has a supply of donated supplemental nutrition drinks to provide to patients  [facility]         Joyce Copa [facility]         [address] [place], [place] [address]         Phone: [phone]          Hours: Monday - Friday, 8 am - 4 pm; Evening activities begin at 6 pm, Monday    - Thursday          Website: [email or web address]  Some Medicare Advantage plans have an over-the-counter allowance that patients can use to buy supplemental nutrition.

## 2023-05-09 DIAGNOSIS — G5602 Carpal tunnel syndrome, left upper limb: Principal | ICD-10-CM

## 2023-05-09 DIAGNOSIS — M069 Rheumatoid arthritis, unspecified: Principal | ICD-10-CM

## 2023-05-09 MED ORDER — PREDNISONE 10 MG TABLET
ORAL_TABLET | ORAL | 0 refills | 11.00 days | Status: CP
Start: 2023-05-09 — End: 2023-07-06

## 2023-05-13 NOTE — Unmapped (Signed)
[facility] Internal Medicine   CARE MANAGEMENT ENCOUNTER           Date of Service:  05/13/2023      Service:  Care Coordination - phone  Is there someone else in the room? No.   MyChart use by patient is active: no    Post-outreach Action Items:  Provider: No/none.  CM:  Mail coupons.  Patient: No/none.    Purpose of contact:     Care Manager (CM) followed up on request for assisting pt with resources for supplemental nutrition.     Care Manager (CM) completed the following related to the above request:    Reviewed chart  [facility] Medicare Advantage  Identified resources   [facility] has coupons for supplemental nutrition to provide to patient  [facility] has a supply of donated supplemental nutrition drinks to provide to patients  [facility]         Joyce Copa [facility]         [address], [place], [place] [address]         Phone: [phone]          Hours: Monday - Friday, 8 am - 4 pm; Evening activities begin at 6 pm, Monday    - Thursday          Website: [email or web address].fi  Some Medicare Advantage plans have an over-the-counter allowance that patients can use to buy supplemental nutrition.  Contacted Vernal Scullion via phone, and relayed above information. She stated her Medicare Advantage Card does not have an over-the-counter allowance.   Pt in agreement with receiving coupons and [employer] via mail.     I provided an intervention for the [employer] and Food Insecurity SDOH domain. The intervention was Provided [employer]     A copy of this Patient Outreach Encounter was sent to patient's Primary Care Provider

## 2023-05-15 NOTE — Unmapped (Signed)
Care Assistant reviewed patient's medical records and [place] CSRS to determine eligibility for Internal Medicine Pain Clinic.     PCP with Internal Medicine:   - Yes  - Patient does follow-up with PCP or Internal Medicine at regular intervals  - Date of establishing care is more than 6 months ago.     Problem List Review:  - Pain generators include rheumatoid arthritis, spondylolisthesis of lumbar region, pain of the toe of left foot, spinal stenosis of lumbar region   - no history of cocaine use  - no history of substance use disorder  - no current medication treatment agreements or previous violations     Chart Review:   - 1 previous visits with Dr. Octavio Manns or [facility]  - no history of cocaine use  - no history of substance use disorder  - no previous enrollment at other Pain Management clinics     Urine Toxicology Tests:  No resent test results available    [place] CSRS 5-year Review:  - Shows 0 opiate prescriptions within the last year  - Opiates are not prescribed by [facility] providers within the last year  - 0 providers have prescribed patient opioids in the last year  - 3 providers have prescribed patient opioids in the last five years     Determination:  Based on the above information, patient is eligible for enrollment with [facility]. [facility] PAC Admin Staff will contact patien to schedule a NEW PAIN visit.

## 2023-05-22 DIAGNOSIS — G4700 Insomnia, unspecified: Principal | ICD-10-CM

## 2023-05-22 MED ORDER — QUETIAPINE 25 MG TABLET
ORAL_TABLET | 1 refills | 0.00 days
Start: 2023-05-22 — End: ?

## 2023-05-26 MED ORDER — QUETIAPINE 25 MG TABLET
ORAL_TABLET | 1 refills | 0.00 days | Status: CP
Start: 2023-05-26 — End: ?

## 2023-05-28 DIAGNOSIS — M069 Rheumatoid arthritis, unspecified: Principal | ICD-10-CM

## 2023-05-28 NOTE — Unmapped (Signed)
Patient has new insurance for 2025 and [employer]em[employer] will need new PA.   Will call patient back to onboard for Actemra once approved.     [facility] Specialty and Home Delivery Pharm[facility] Refill Coordination Note    Specialty Medication(s) to be Shipped:   Inflammatory Disorders: Rasuvo    Other medication(s) to be shipped: No additional medications requested for fill at this time     Suzann Borrello, DOB: [birth year]  Phone: [phone] (home)       All above HIPAA information was verified with patient.     Was a Nurse, learning disability used for this call? No    Completed refill call assessment today to schedule patient's medication shipment from the [facility] and [facility] Delivery Pharmacy  [phone]).  All relevant notes have been reviewed.     Specialty medication(s) and dose(s) confirmed: Regimen is correct and unchanged.   Changes to medications: Claudene reports no changes at this time.  Changes to insurance: No  New side effects reported not previously addressed with a pharmacist or physician: None reported  Questions for the pharmacist: No    Confirmed patient received a Conservation officer, historic buildings and a Surveyor, mining with first shipment. The patient will receive a drug information handout for each medication shipped and additional FDA Medication Guides as required.       DISEASE/MEDICATION-SPECIFIC INFORMATION        For patients on injectable medications: Patient currently has 0 doses left.  Next injection is scheduled for ASAP.    SPECIALTY MEDICATION ADHERENCE              Were doses missed due to medication being on hold? No        REFERRAL TO PHARMACIST     Referral to the pharmacist: Not needed      [facility]     Shipping address confirmed in Epic.       Delivery Scheduled: Yes, Expected medication delivery date: 05/29/22.     Medication will be delivered via UPS to the prescription address in Epic WAM.    Julianne Rice, PharmD   [facility] Delivery Pharm[facility]  Specialty Pharmacist

## 2023-05-28 NOTE — Unmapped (Signed)
Copied from CRM #[id number]. Topic: Access To Clinicians - Medication Refill  >> May 28, 2023  1:20 PM Maudry Mayhew wrote:  The PAC has received an incoming call requesting medication refill/clarification/question:  Rx refill    Caller: Dehlia Leighty    Best callback number: [phone]    Type of request (refill, clarification, question):  Rx refill     Name of medication:   predniSONE (DELTASONE) 10 MG tablet [[id number]]       Is there a preferred amount requested (e.g. 30 pills, 3 months worth - if no leave blank):     Desired pharmacy:     [id number] [place] [place] - [address]  [address]  [place] [address]  Phone: [phone] Fax: [phone]        Does caller request a callback from a nurse to discuss?:

## 2023-05-28 NOTE — Unmapped (Signed)
[facility] RHEUMATOLOGY CLINIC - PHARMACIST NOTES    The [facility] Specialty and Home Delivery Pharmacy [facility]) has not been able to reach patient to onboard Actemra.  Attempted to check in patient today.  She is unsure what happened and why she didn't call back. Transferred patient to [facility] for onboarding.  Unfortunately patient has new insurance and will need repeat benefit investigation.    [name]

## 2023-05-29 MED FILL — RASUVO (PF) 25 MG/0.5 ML SUBCUTANEOUS AUTO-INJECTOR: SUBCUTANEOUS | 28 days supply | Qty: 2 | Fill #3

## 2023-05-29 NOTE — Unmapped (Signed)
Called patient. Left message advising that PCP feels prednisone should be done. Pt should contact rheumatologist Dr Tomasa Rand if she feels like she still needs it.

## 2023-06-02 DIAGNOSIS — M069 Rheumatoid arthritis, unspecified: Principal | ICD-10-CM

## 2023-06-02 MED ORDER — PREDNISONE 5 MG TABLET
ORAL_TABLET | Freq: Every day | ORAL | 0 refills | 30.00 days | Status: CP
Start: 2023-06-02 — End: ?

## 2023-06-06 ENCOUNTER — Emergency Department: Admit: 2023-06-06 | Discharge: 2023-06-06 | Disposition: A | Discharge status: Home / Self Care | Payer: MEDICARE

## 2023-06-06 DIAGNOSIS — S20419A Abrasion of unspecified back wall of thorax, initial encounter: Principal | ICD-10-CM

## 2023-06-06 DIAGNOSIS — R059 Cough, unspecified type: Principal | ICD-10-CM

## 2023-06-06 MED ORDER — CEPHALEXIN 500 MG CAPSULE
ORAL_CAPSULE | Freq: Three times a day (TID) | ORAL | 0 refills | 7.00 days | Status: CP
Start: 2023-06-06 — End: 2023-06-13

## 2023-06-06 MED ADMIN — bacitracin ointment: TOPICAL | @ 21:00:00 | Stop: 2023-06-06

## 2023-06-06 NOTE — Unmapped (Signed)
[facility]  Emergency Department Provider Note      ED Clinical Impression      Final diagnoses:   Back abrasion, unspecified laterality, initial encounter (Primary)   Cough, unspecified type            Impression, Medical Decision Making, Progress Notes and Critical Care      Impression, Differential Diagnosis and Plan of Care    Bethany Meyer is a 68 y.o. female with a significant PMH COPD, functional dyspepsia, GERD, rheumatoid arthritis presenting to the ER today with abrasions to back that occurred a few days ago.  Also notes ongoing cough for the past few weeks without shortness of breath or chest pain.    On exam, well-appearing with stable vital signs.  Afebrile, no tachycardia, O2 sats 99% on room air.  Multiple superficial abrasions noted to back without gaping wound or laceration, no surrounding erythema or induration suggestive of cellulitis at this time.  Lungs are clear, heart normal rate and rhythm.  Chest x-ray obtained along with COVID flu RSV swab.  Bacitracin applied while here.      Patient negative for COVID, flu, RSV, chest x-ray negative.  Discussed applying bacitracin along with prescription Keflex for prevention of skin infection.  Recommend close follow-up with PCP for further management along with her GI doctor who she states she is scheduled to see.  Return to the ER if she develops any new or worsening symptoms.  She verbalized understanding and agreement with the plan of care.            Additional Progress Notes           Portions of this record have been created using Scientist, clinical (histocompatibility and immunogenetics). Dictation errors have been sought, but may not have been identified and corrected.    See chart and resident provider documentation for details.    ____________________________________________         History        Reason for Visit  Back Pain      HPI   Bethany Meyer is a 68 y.o. female with a significant PMH COPD, functional dyspepsia, GERD, rheumatoid arthritis presenting to the ER today due to back abrasions along with cough.  Patient reports she has had acute on chronic abdominal pain for the past several months, is currently followed by GI for same.  Notes that she had an acute flare 6 days ago that lasted for approximately 3 days.  It has since resolved.  She states that due to the pain, she had been rocking back and forth in a chair to distract herself and states that she had done so to the point that she had multiple scratches noted to her back.  She states that she has had loose skin due to weight loss from her abdominal pain which results in more frequent skin tears and abrasions.  Denies any other recent trauma or injury.  Notes that she is scheduled to see her PCP in the next few weeks for follow-up on her abdominal pain and denies any abdominal pain today and denies further workup of her abdominal pain as it has since resolved.  Denies chest pressure, shortness of breath, vomiting, nausea, fever, urinary or bowel incontinence or retention, saddle anesthesia, midline back pain, difficulty ambulating, numbness or weakness.    She reports a cough for the last week and denied SOB, she denied any systemic symptoms like fever, chills, headache or any symptoms of a systemic infection.  External Records Reviewed  (Inpatient/Outpatient notes, Prior labs/imaging studies, Care Everywhere, PDMP, External ED notes, etc)        Past Medical History:   Diagnosis Date    Arthritis     RA    Cataract     Functional dyspepsia     GERD (gastroesophageal reflux disease)     Gout     RA (rheumatoid arthritis) (CMS-HCC)     Smoking     Stage 1 mild COPD by GOLD classification (CMS-HCC) 02/25/2017       Patient Active Problem List   Diagnosis    Tobacco use disorder    GERD (gastroesophageal reflux disease)    Gastritis    Functional Abdominal pain    Anxiety    Depression    Rheumatoid arthritis (CMS-HCC)    Atrial flutter (CMS-HCC)    Immunosuppressed status (CMS-HCC) Rheumatoid arthritis involving both feet (CMS-HCC)    Stage 1 mild COPD by GOLD classification (CMS-HCC)    Piriformis syndrome of both sides    Spinal stenosis of lumbar region with neurogenic claudication    Spondylolisthesis of lumbar region    Duodenal ulcer disease    Paraesophageal hernia    Osteomyelitis (CMS-HCC)    Slow transit constipation    Combined forms of age-related cataract of both eyes       Past Surgical History:   Procedure Laterality Date    ovarian cyst removed      OVARY SURGERY      PR COLONOSCOPY FLX DX W/COLLJ SPEC WHEN PFRMD N/A 03/05/2013    Procedure: COLONOSCOPY, FLEXIBLE, PROXIMAL TO SPLENIC FLEXURE; DIAGNOSTIC, W/WO COLLECTION SPECIMEN BY BRUSH OR WASH;  Surgeon: Trudie Buckler, MD;  Location: GI PROCEDURES [facility];  Service: Gastroenterology    PR COMPRE EP EVAL ABLTJ 3D MAPG TX SVT N/A 12/26/2015    Procedure: A-Flutter Ablation;  Surgeon: Theresia Majors, MD;  Location: [facility] EP;  Service: Cardiology    PR UP GI ENDOSCOPY,BALL DIL,30MM N/A 06/03/2019    Procedure: UGI ENDO; W/BALLOON DILAT ESOPHAGUS (<30MM DIAM);  Surgeon: Neysa Hotter, MD;  Location: [facility]ED[facility];  Service: Gastroenterology    PR UPPER GI ENDOSCOPY,BIOPSY N/A 09/16/2012    Procedure: UGI ENDOSCOPY; WITH BIOPSY, SINGLE OR MULTIPLE;  Surgeon: Brown Human, MD;  [facility];  Service: Gastroenterology    PR UPPER GI ENDOSCOPY,BIOPSY N/A 06/03/2019    Procedure: UGI ENDOSCOPY; WITH BIOPSY, SINGLE OR MULTIPLE;  Surgeon: Neysa Hotter, MD;  Location: [facility] PROC[facility];  Service: Gastroenterology    PR UPPER GI ENDOSCOPY,DIAGNOSIS N/A 06/03/2019    Procedure: UGI ENDO, INCLUDE ESOPHAGUS, STOMACH, & DUODENUM &/OR JEJUNUM; DX W/WO COLLECTION SPECIMN, BY BRUSH OR WASH;  Surgeon: Neysa Hotter, MD;  Location: [facility] PROC[facility];  Service: Gastroenterology    PR XCAPSL CTRC RMVL INSJ IO LENS PROSTH CPLX WO ECP Right 05/29/2022 Procedure: EXTRACAPSULAR CATARACT REMOVAL WITH INSERTION OF INTRAOCULAR LENS PROSTHESIS, COMPLEX WITHOUT ENDOSCOPIC CYCLOPHOTOCOAGULATION;  Surgeon: Garner Gavel, MD;  Location: [facility];  Service: Ophthalmology    PR XCAPSL CTRC RMVL INSJ IO LENS PROSTH W/O ECP Left 07/03/2022    Procedure: EXTRACAPSULAR CATARACT REMOVAL W/INSERTION OF INTRAOCULAR LENS PROSTHESIS, MANUAL OR MECHANICAL TECHNIQUE WITHOUT ENDOSCOPIC CYCLOPHOTOCOAGULATION;  Surgeon: Garner Gavel, MD;  Location: [facility];  Service: Ophthalmology       No current facility-administered medications for this encounter.    Current Outpatient  Medications:     acetaminophen (TYLENOL) 325 MG tablet, Take 1 tablet (325 mg total) by mouth two (2) times a day as needed for pain., Disp: , Rfl:     arm brace Misc, Boxer splint, metacarpal wrist brace, Disp: 1 each, Rfl: 0    aspirin 81 MG chewable tablet, CHEW 1 TABLET BY MOUTH DAILY, Disp: 36 tablet, Rfl: 3    cephalexin (KEFLEX) 500 MG capsule, Take 1 capsule (500 mg total) by mouth Three (3) times a day for 7 days., Disp: 21 capsule, Rfl: 0    dicyclomine (BENTYL) 20 mg tablet, Take 1 tablet (20 mg total) by mouth four times a day as needed (stomach cramps)., Disp: 360 tablet, Rfl: 3    DULoxetine (CYMBALTA) 30 MG capsule, Take 1 capsule (30 mg total) by mouth daily., Disp: 90 capsule, Rfl: 3    empty container Misc, Use as directed, Disp: 1 each, Rfl: 2    folic acid (FOLVITE) 1 MG tablet, Take 1 tablet (1,000 mcg total) by mouth daily., Disp: 90 tablet, Rfl: 3    gabapentin (NEURONTIN) 300 MG capsule, 3 capsules po morning and midday, 4 capsules po qhs, Disp: 360 capsule, Rfl: 3    hydrOXYzine (ATARAX) 10 MG tablet, Take 1 tablet (10 mg total) by mouth every six (6) hours as needed for itching., Disp: 270 tablet, Rfl: 1    leg brace (KNEE SUPPORT BRACE) Misc, 1 each by Miscellaneous route daily as needed., Disp: 1 each, Rfl: 0    methotrexate, PF, (RASUVO, PF,) 25 mg/0.5 mL [name], Inject the contents of 1 auto-injector (25 mg total) under the skin every seven (7) days., Disp: 2 mL, Rfl: 5    needle, disp, 25 gauge 25 gauge x 5/8 Ndle, For methotrexate injections, Disp: 100 each, Rfl: 0    predniSONE (DELTASONE) 5 MG tablet, Take 1 tablet (5 mg total) by mouth daily., Disp: 30 tablet, Rfl: 0    QUEtiapine (SEROQUEL) 25 MG tablet, 1 TABLET BY MOUTH AT BEDTIME. MAY INCREASE TO 2 TABLETS AT BEDTIME IF NEEDED, Disp: 180 tablet, Rfl: 1    tocilizumab 162 mg/0.9 mL PnIj, Inject the contents of 1 pen (162 mg) under the skin once a week., Disp: 10.8 mL, Rfl: 3    traMADol (ULTRAM) 50 mg tablet, Take 1-2 tablets (50-100 mg total) by mouth every eight (8) hours as needed for pain., Disp: 180 tablet, Rfl: 2    umeclidinium (INCRUSE ELLIPTA) 62.5 mcg/actuation inhaler, Inhale 1 puff daily., Disp: 30 each, Rfl: 2    VITAMIN B-1 100 mg tablet, Take 1 tablet (100 mg total) by mouth daily., Disp: 100 tablet, Rfl: 3    Allergies  Patient has no known allergies.    Family History   Problem Relation Age of Onset    Diabetes Mother         type 2    Breast cancer Mother     Diabetes Father         type 2    Cataracts Father     Macular degeneration Neg Hx     Retinal detachment Neg Hx     Colon cancer Neg Hx     Endometrial cancer Neg Hx     Ovarian cancer Neg Hx     Glaucoma Neg Hx     Blindness Neg Hx        Social History  Social History     Tobacco Use    Smoking status: Every Day  Current packs/day: 1.00     Average packs/day: 1 pack/day for 35.0 years (35.0 ttl pk-yrs)     Types: Cigarettes    Smokeless tobacco: Never    Tobacco comments:     Started smoking at 33,    Vaping Use    Vaping status: Some Days   Substance Use Topics    Alcohol use: No     Alcohol/week: 0.0 standard drinks of alcohol    Drug use: Yes     Types: Marijuana     Comment: once a month          Physical Exam       ED Triage Vitals [06/06/23 1351]   Enc Vitals Group      BP 141/93      Heart Rate 79      SpO2 Pulse Resp 20      Temp 36.7 ??C (98.1 ??F)      Temp src       SpO2 96 %      Weight       Height       Head Circumference       Peak Flow       Pain Score       Pain Loc       Pain Education       Exclude from Growth Chart        Constitutional: Alert and oriented. Well appearing and in no distress.  Eyes: Conjunctivae are normal.  ENT       Head: Normocephalic and atraumatic.       Nose: No congestion.       Mouth/Throat: Mucous membranes are moist.       Neck: No stridor.  Hematological/Lymphatic/Immunilogical: No cervical lymphadenopathy.  Cardiovascular: Normal rate, regular rhythm. Normal and symmetric distal pulses are present in all extremities.  Respiratory: Normal respiratory effort. Breath sounds on left unremarkable, crackles to RLL.   Gastrointestinal: Soft and nontender. There is no CVA tenderness.  Genitourinary: soft and nontender  Musculoskeletal: Normal range of motion in all extremities.  No midline tenderness of C, T, L-spine.       Right lower leg: No tenderness or edema.       Left lower leg: No tenderness or edema.  Neurologic: Normal speech and language. No gross focal neurologic deficits are appreciated.  Skin: Multiple superficial abrasions noted to back without gaping laceration, no surrounding erythema or induration  Psychiatric: Mood and affect are normal. Speech and behavior are normal.       Radiology     XR Chest 2 views   Final Result      No findings of acute cardiopulmonary disease                             Labs     Results for orders placed or performed during the hospital encounter of 06/06/23   Influenza/ RSV/COVID PCR    Specimen: Nasopharyngeal Swab   Result Value Ref Range    SARS-CoV-2 PCR Negative Negative    Influenza A Negative Negative    Influenza B Negative Negative    RSV Negative Negative     *Note: Due to a large number of results and/or encounters for the requested time period, some results have not been displayed. A complete set of results can be found in Results Review.       Pertinent labs & imaging results that were  available during my care of the patient were reviewed by me and considered in my medical decision making (see chart for details).               Margaret Pyle [place], [place]  06/06/23 [phone]

## 2023-06-06 NOTE — Unmapped (Signed)
Pt has skin abrasions to full back and to gluteal fold from rocking due to pain at home. Pt also states sore throat from vomiting.   Pt arrives with  c/o gi issues for 6 years.  Pt states she has lost over 100 pounds.  Pt states her issues keep her up for days at a time.  Current episode started on Friday and lasted for 3 days (Monday).  GI issues with flare is vomiting even with water and leg cramping.

## 2023-06-16 DIAGNOSIS — M069 Rheumatoid arthritis, unspecified: Principal | ICD-10-CM

## 2023-06-16 DIAGNOSIS — G5602 Carpal tunnel syndrome, left upper limb: Principal | ICD-10-CM

## 2023-06-16 DIAGNOSIS — F32A Depression, unspecified depression type: Principal | ICD-10-CM

## 2023-06-16 DIAGNOSIS — J449 Chronic obstructive pulmonary disease, unspecified: Principal | ICD-10-CM

## 2023-06-16 MED ORDER — SYMBICORT 80 MCG-4.5 MCG/ACTUATION HFA AEROSOL INHALER
0 refills | 0.00 days
Start: 2023-06-16 — End: ?

## 2023-06-16 MED ORDER — PREDNISONE 10 MG TABLET
ORAL_TABLET | ORAL | 0 refills | 0.00 days
Start: 2023-06-16 — End: ?

## 2023-06-16 MED ORDER — BUPROPION HCL XL 150 MG 24 HR TABLET, EXTENDED RELEASE
ORAL_TABLET | Freq: Every morning | ORAL | 0 refills | 0.00 days
Start: 2023-06-16 — End: ?

## 2023-06-16 MED ORDER — PREDNISONE 5 MG TABLET
ORAL_TABLET | Freq: Every day | ORAL | 0 refills | 30.00 days
Start: 2023-06-16 — End: ?

## 2023-06-16 MED ORDER — ALENDRONATE 70 MG TABLET
ORAL_TABLET | 3 refills | 0.00 days
Start: 2023-06-16 — End: ?

## 2023-06-16 NOTE — Unmapped (Signed)
This was signed two weeks ago

## 2023-06-17 MED ORDER — SYMBICORT 80 MCG-4.5 MCG/ACTUATION HFA AEROSOL INHALER
0 refills | 0.00 days
Start: 2023-06-17 — End: ?

## 2023-06-17 MED ORDER — ALENDRONATE 70 MG TABLET
ORAL_TABLET | 3 refills | 0.00 days
Start: 2023-06-17 — End: ?

## 2023-06-17 MED ORDER — BUPROPION HCL XL 150 MG 24 HR TABLET, EXTENDED RELEASE
ORAL_TABLET | Freq: Every morning | ORAL | 0 refills | 90.00 days
Start: 2023-06-17 — End: ?

## 2023-06-17 MED ORDER — PREDNISONE 10 MG TABLET
ORAL_TABLET | ORAL | 0 refills | 58.00 days
Start: 2023-06-17 — End: 2023-08-14

## 2023-06-17 NOTE — Unmapped (Signed)
[facility]    Patient Onboarding/Medication Counseling      Actemra approved under transition fill.  PA requested for future fills.     Bethany Meyer is a 68 y.o. female with RA who I am counseling today on initiation of therapy.  I am speaking to the patient.    Was a Nurse, learning disability used for this call? No    Verified patient's date of birth / HIPAA.    Specialty medication(s) to be sent: Inflammatory Disorders: Actemra      Non-specialty medications/supplies to be sent: n/a      Medications not needed at this time: n/a         Actemra?? (tocilizumab)    Medication & Administration     Dosage: Rheumatoid arthritis (100kg or more): Inject 162mg  under the skin one time weekly    Lab tests required prior to treatment initiation:  Tuberculosis: Tuberculosis screening resulted in a non-reactive Quantiferon TB Gold assay.  Absolute neutrophil count: ANC greater than 2,000/mm3.  Platelet count: Platelets are greater than 100,000/mm3.  Liver function tests: ALT and AST are less than 1.5 x ULN.      Administration:       Systems developer all supplies needed for injection on a clean, flat working surface: medication pen removed from packaging, alcohol swab, sharps container, etc.  Look at the medication label - look for correct medication, correct dose, and check the expiration date  Look at the medication - the liquid visible in the window on the side of the pen device should appear clear and colorless to pale yellow  Lay the auto-injector pen on a flat surface and allow it to warm up to room temperature for at least 45 minutes  Select injection site - you can use the front of your thigh or your belly (but not the area 2 inches around your belly button); if someone else is giving you the injection you can also use your upper arm in the skin covering your triceps muscle  Prepare injection site - wash your hands and clean the skin at the injection site with an alcohol swab and let it air dry, do not touch the injection site again before the injection  Twist and pull off the green safety cap, do not remove until immediately prior to injection and do not touch the needle shield  Pinch the skin - with your hand not holding the auto-injector pinch up a fold of skin at the injection site using your forefinger and thumb to prepare a firm injection site  Put the needle shield against your skin at the injection site at a 90 degree angle, hold the pen such that you can see the clear medication window  To initiate the injection unlock the green activation button by pressing the needle shield completely in against the injection site, then depress the green activation button to start the injection - you will hear a click sound  Continue to press the green activation button and hold the pen firmly against your skin for about 10 seconds - the window will start to turn solid purple  There will be a second click sound when the injection is complete, verify the window is solid purple before pulling the pen away from your skin  Dispose of the used auto-injector pen immediately in your sharps disposal container the needle will be covered automatically  If you see any blood at the injection site, press a cotton ball or gauze  on the site and maintain pressure until the bleeding stops, do not rub the injection site      Adherence/Missed dose instructions:  If your injection is given more than 2 days after your scheduled injection date - consult your pharmacist for additional instructions on how to adjust your dosing schedule.      Goals of Therapy     Giant cell arteritis  Reduction or normalization of ESR and CRP  Prevention of ischemic manifestations  Preservation of vision  Maintenance of function  Maintenance of effective psychosocial functioning    Polyarticular juvenile idiopathic arthritis  Assuring normal growth and development  Avoidance of long-term systemic glucocorticoid use  Protection of remaining articular structures  Maintenance of function  Maintenance of effective psychosocial functioning    Systemic juvenile idiopathic arthritis  Assuring normal growth and development  Avoidance of long-term systemic glucocorticoid use  Protection of remaining articular structures  Maintenance of function  Maintenance of effective psychosocial functioning    Rheumatoid arthritis  Achieve low disease activity or remission  Slow disease progression  Protection of remaining articular structures  Maintenance of function  Maintenance of effective psychosocial functioning      Side Effects & Monitoring Parameters     Injection site reaction (redness, irritation, inflammation localized to the site of administration)  Signs of a common cold - minor sore throat, runny or stuffy nose, etc.  Headache    The following side effects should be reported to the provider:  Signs of a hypersensitivity reaction - rash; hives; itching; red, swollen, blistered, or peeling skin; wheezing; tightness in the chest or throat; difficulty breathing, swallowing, or talking; swelling of the mouth, face, lips, tongue, or throat; etc.  Reduced immune function - report signs of infection such as fever; chills; body aches; very bad sore throat; ear or sinus pain; cough; more sputum or change in color of sputum; pain with passing urine; wound that will not heal, etc.  Also at a slightly higher risk of some malignancies (mainly skin and blood cancers) due to this reduced immune function.  In the case of signs of infection - the patient should hold the next dose of Actemra?? and call your primary care provider to ensure adequate medical care.  Treatment may be resumed when infection is treated and patient is asymptomatic.  Signs of unexplained bruising or bleeding - throwing up blood or emesis that looks like coffee grounds; black, tarry, or bloody stool; etc.  Changes in skin - a new growth or lump that forms; changes in shape, size, or color of a previous mole or marking  Shortness of breath  Chest pain/pressure      Warnings, Precautions, & Contraindications     Have your bloodwork checked as you have been told by your prescriber (ANC, platelets, lipids, etc.)  Patients with concomitant IBD or history of diverticulitis are at an increased risk for GI perforations.  Birth control pills and other hormone-based birth control may not work as well to prevent pregnancy  Talk with your doctor if you are pregnant, planning to become pregnant, or breastfeeding  Discuss the possible need for holding your dose(s) of Actemra?? when a planned procedure is scheduled with the prescriber as it may delay healing/recovery timeline       Drug/Food Interactions     Medication list reviewed in Epic. The patient was instructed to inform the care team before taking any new medications or supplements. No drug interactions identified.   Talk with you prescriber  or pharmacist before receiving any live vaccinations while taking this medication and after you stop taking it    Storage, Handling Precautions, & Disposal     Store this medication in the refrigerator.  Do not freeze  If needed, you may store at room temperature for up to 14 days  Store in original packaging, protected from light  Do not shake  Dispose of used syringes/pens in a sharps disposal container          Current Medications (including OTC/herbals), Comorbidities and Allergies     Current Outpatient Medications   Medication Sig Dispense Refill    acetaminophen (TYLENOL) 325 MG tablet Take 1 tablet (325 mg total) by mouth two (2) times a day as needed for pain.      arm brace Misc Boxer splint, metacarpal wrist brace 1 each 0    aspirin 81 MG chewable tablet CHEW 1 TABLET BY MOUTH DAILY 36 tablet 3    dicyclomine (BENTYL) 20 mg tablet Take 1 tablet (20 mg total) by mouth four times a day as needed (stomach cramps). 360 tablet 3    DULoxetine (CYMBALTA) 30 MG capsule Take 1 capsule (30 mg total) by mouth daily. 90 capsule 3    empty container Misc Use as directed 1 each 2    folic acid (FOLVITE) 1 MG tablet Take 1 tablet (1,000 mcg total) by mouth daily. 90 tablet 3    gabapentin (NEURONTIN) 300 MG capsule 3 capsules po morning and midday, 4 capsules po qhs 360 capsule 3    hydrOXYzine (ATARAX) 10 MG tablet Take 1 tablet (10 mg total) by mouth every six (6) hours as needed for itching. 270 tablet 1    leg brace (KNEE SUPPORT BRACE) Misc 1 each by Miscellaneous route daily as needed. 1 each 0    methotrexate, PF, (RASUVO, PF,) 25 mg/0.5 mL [name] Inject the contents of 1 auto-injector (25 mg total) under the skin every seven (7) days. 2 mL 5    needle, disp, 25 gauge 25 gauge x 5/8 Ndle For methotrexate injections 100 each 0    predniSONE (DELTASONE) 5 MG tablet Take 1 tablet (5 mg total) by mouth daily. 30 tablet 0    QUEtiapine (SEROQUEL) 25 MG tablet 1 TABLET BY MOUTH AT BEDTIME. MAY INCREASE TO 2 TABLETS AT BEDTIME IF NEEDED 180 tablet 1    tocilizumab 162 mg/0.9 mL PnIj Inject the contents of 1 pen (162 mg) under the skin once a week. 10.8 mL 3    traMADol (ULTRAM) 50 mg tablet Take 1-2 tablets (50-100 mg total) by mouth every eight (8) hours as needed for pain. 180 tablet 2    umeclidinium (INCRUSE ELLIPTA) 62.5 mcg/actuation inhaler Inhale 1 puff daily. 30 each 2    VITAMIN B-1 100 mg tablet Take 1 tablet (100 mg total) by mouth daily. 100 tablet 3     No current facility-administered medications for this visit.       No Known Allergies    Patient Active Problem List   Diagnosis    Tobacco use disorder    GERD (gastroesophageal reflux disease)    Gastritis    Functional Abdominal pain    Anxiety    Depression    Rheumatoid arthritis (CMS-HCC)    Atrial flutter (CMS-HCC)    Immunosuppressed status (CMS-HCC)    Rheumatoid arthritis involving both feet (CMS-HCC)    Stage 1 mild COPD by GOLD classification (CMS-HCC)    Piriformis syndrome of both sides  Spinal stenosis of lumbar region with neurogenic claudication    Spondylolisthesis of lumbar region    Duodenal ulcer disease    Paraesophageal hernia    Osteomyelitis (CMS-HCC)    Slow transit constipation    Combined forms of age-related cataract of both eyes       Medication list has been reviewed and updated in Epic: Yes    Allergies have been reviewed and updated in Epic: Yes    Appropriateness of Therapy     Acute infections noted within Epic:  No active infections  Patient reported infection: None    Is the medication and dose appropriate based on diagnosis, medication list, comorbidities, allergies, medical history, patient???s ability to self-administer the medication, and therapeutic goals? Yes    Prescription has been clinically reviewed: Yes      Baseline Quality of Life Assessment      How many days over the past month did your RA  keep you from your normal activities? For example, brushing your teeth or getting up in the morning. Patient declined to answer    Financial Information     Medication Assistance provided: Prior Authorization    Anticipated copay of $12.15 reviewed with patient. Verified delivery address.    Delivery Information     Scheduled delivery date: 1/31    Expected start date: 1/31      Medication will be delivered via UPS to the prescription address in [facility].  This shipment will not require a signature.      Explained the services we provide at [facility] and that each month we would call to set up refills.  Stressed importance of returning phone calls so that we could ensure they receive their medications in time each month.  Informed patient that we should be setting up refills 7-10 days prior to when they will run out of medication.  A pharmacist will reach out to perform a clinical assessment periodically.  Informed patient that a welcome packet, containing information about our pharmacy and other support services, a Notice of Privacy Practices, and a drug information handout will be sent.      The patient or caregiver noted above participated in the development of this care plan and knows that they can request review of or adjustments to the care plan at any time.      Patient or caregiver verbalized understanding of the above information as well as how to contact the pharmacy at [phone] option 4 with any questions/concerns.  The pharmacy is open Monday through Friday 8:30am-4:30pm.  A pharmacist is available 24/7 via pager to answer any clinical questions they may have.    Patient Specific Needs     Does the patient have any physical, cognitive, or cultural barriers? No    Does the patient have adequate living arrangements? (i.e. the ability to store and take their medication appropriately) Yes    Did you identify any home environmental safety or security hazards? No    Patient prefers to have medications discussed with  Patient     Is the patient or caregiver able to read and understand education materials at a high school level or above? Yes    Patient's primary language is  English     Is the patient high risk? No    Does the patient have an additional or emergency contact listed in their chart?  Yes    SOCIAL DETERMINANTS OF HEALTH     At the [facility], we have learned that life circumstances - like trouble affording food, housing, utilities, or transportation can affect the health of many of our patients.   That is why we wanted to ask: are you currently experiencing any life circumstances that are negatively impacting your health and/or quality of life? No    Social Drivers of Health     Food Insecurity: Food Insecurity Present (04/29/2023)    Hunger Vital Sign     Worried About Running Out of Food in the Last Year: Sometimes true     Ran Out of Food in the Last Year: Sometimes true   Internet Connectivity: Not on file   Housing/Utilities: High Risk (04/29/2023)    Housing/Utilities     Within the past 12 months, have you ever stayed: outside, in a car, in a tent, in an overnight shelter, or temporarily in someone else's home (i.e. couch-surfing)?: No     Are you worried about losing your housing?: No     Within the past 12 months, have you been unable to get utilities (heat, electricity) when it was really needed?: Yes   Tobacco Use: High Risk (06/06/2023)    Patient History     Smoking Tobacco Use: Every Day     Smokeless Tobacco Use: Never     Passive Exposure: Not on file   Transportation Needs: No Transportation Needs (04/29/2023)    PRAPARE - Transportation     Lack of Transportation (Medical): No     Lack of Transportation (Non-Medical): No   Alcohol Use: Not At Risk (04/29/2023)    Alcohol Use     How often do you have a drink containing alcohol?: Monthly or less     How many drinks containing alcohol do you have on a typical day when you are drinking?: 1 - 2     How often do you have 5 or more drinks on one occasion?: Never   Interpersonal Safety: Not At Risk (04/29/2023)    Interpersonal Safety     Unsafe Where You Currently Live: No     Physically Hurt by Anyone: No     Abused by Anyone: No   Physical Activity: Unknown (02/02/2019)    Exercise Vital Sign     Days of Exercise per Week: Patient declined     Minutes of Exercise per Session: Patient declined   Intimate Partner Violence: Not At Risk (04/29/2023)    Humiliation, Afraid, Rape, and Kick questionnaire     Fear of Current or Ex-Partner: No     Emotionally Abused: No     Physically Abused: No     Sexually Abused: No   Stress: Unknown (02/02/2019)    [employer] of Occupational Health - Occupational Stress Questionnaire     Feeling of Stress : Patient declined   Substance Use: Medium Risk (04/29/2023)    Substance Use     In the past year, how often have you used prescription drugs for non-medical reasons?: Daily or Almost Daily     In the past year, how often have you used illegal drugs?: Once or Twice Yearly     In the past year, have you used any substance for non-medical reasons?: Yes   Social Connections: Unknown (10/02/2021)    Received from [facility]    Social Network     Social Network: Not on file   Financial Resource Strain: High Risk (04/29/2023)    Overall Financial Resource Strain (CARDIA)  Difficulty of Paying Living Expenses: Hard   Depression: At risk (08/22/2020)    PHQ-2     PHQ-2 Score: 5   Health Literacy: Not on file (08/24/2020)       Would you be willing to receive help with any of the needs that you have identified today? Not applicable       Julianne Rice, PharmD  [facility] Specialty Pharmacist

## 2023-06-18 MED ORDER — ALENDRONATE 70 MG TABLET
ORAL_TABLET | 3 refills | 0.00 days | Status: CP
Start: 2023-06-18 — End: ?

## 2023-06-19 MED FILL — RASUVO (PF) 25 MG/0.5 ML SUBCUTANEOUS AUTO-INJECTOR: SUBCUTANEOUS | 28 days supply | Qty: 2 | Fill #4

## 2023-07-08 ENCOUNTER — Ambulatory Visit: Admit: 2023-07-08 | Discharge: 2023-07-09

## 2023-07-08 DIAGNOSIS — G4700 Insomnia, unspecified: Principal | ICD-10-CM

## 2023-07-08 DIAGNOSIS — M069 Rheumatoid arthritis, unspecified: Principal | ICD-10-CM

## 2023-07-08 LAB — TOXICOLOGY SCREEN, URINE
AMPHETAMINE SCREEN URINE: NEGATIVE
BARBITURATE SCREEN URINE: NEGATIVE
BENZODIAZEPINE SCREEN, URINE: NEGATIVE
BUPRENORPHINE, URINE SCREEN: NEGATIVE
CANNABINOID SCREEN URINE: POSITIVE — AB
COCAINE(METAB.)SCREEN, URINE: NEGATIVE
METHADONE SCREEN, URINE: NEGATIVE
OPIATE SCREEN URINE: NEGATIVE
OXYCODONE SCREEN URINE: NEGATIVE

## 2023-07-08 MED ORDER — QUETIAPINE 25 MG TABLET
ORAL_TABLET | 1 refills | 0.00 days | Status: CP
Start: 2023-07-08 — End: ?

## 2023-07-08 MED ORDER — BUPROPION HCL XL 150 MG 24 HR TABLET, EXTENDED RELEASE
ORAL_TABLET | Freq: Every morning | ORAL | 2 refills | 90.00 days | Status: CP
Start: 2023-07-08 — End: ?

## 2023-07-08 MED ORDER — PREDNISONE 5 MG TABLET
ORAL_TABLET | 0 refills | 0.00 days | Status: CP
Start: 2023-07-08 — End: ?

## 2023-07-08 NOTE — Unmapped (Signed)
 [facility] at [facility]     Type of visit: face to face    Are you located in [place]? (for virtual visits only)     Reason for visit: Follow up    Questions / Concerns that need to be addressed: Left hand swelling.         Omron BPs (complete if screening BP has a systolic  > 130 or diastolic > 80)  BP#1 150/95 89     BP#2 148/98 86    BP#3 143/90 81    Average BP 147/94 85  (please note this as a comment in vitals)     PTHomeBP         HCDM reviewed and updated in Epic:    We are working to make sure all of our patients??? wishes are updated in Epic and part of that is documenting a Environmental health practitioner for each patient  A Health Care Decision Maker is someone you choose who can make health care decisions for you if you are not able - who would you most want to do this for you????  was updated.    HCDM (patient stated preference): Bethany Meyer, Bethany Meyer - Daughter - [phone]    HCDM (patient stated preference): Bethany Meyer - Sister - [phone]    BPAs completed:  PHQ2, PHQ9, and GAD7, MMRC           __________________________________________________________________________________________

## 2023-07-08 NOTE — Unmapped (Signed)
 Internal Medicine Clinic    Return Visit    Face to Face    Assessment and Plan:     Diagnosis ICD-10-CM Associated Orders   1. Rheumatoid arthritis, involving unspecified site, unspecified whether rheumatoid factor present (CMS-HCC)  M06.9 predniSONE (DELTASONE) 5 MG tablet     Ambulatory referral to Pain Clinic     Toxicology Screen, Urine     Opiates confirmation, random, urine      2. Insomnia, unspecified type  G47.00 QUEtiapine (SEROQUEL) 25 MG tablet          HCM  not reviewed today.  Assessed last time (Colonoscopy and ct low dose screen annual ordered)    RA followed by Rheum  Active disease inflammation and flare  Not well controlled  She has dramatic wrist and hand involvement on left  Limited grip and use of this hand  Sees Rheumatology in 2 days she plans to return and does not think she can stay today to see if they are able to see her today  Restart prednisone 10 mg daily #60 sent, do not stop abruptly and will ask Rheum to give guidance or dosing    Insomnia and depression  Phq9 3 improved  Continue duloxetine 30 mg daily  Continue buproprion 150 mg daily  Sleep still a challenge increase quetiapine to 50 mg at bedtime and 75 mg at bedtime if needed    Pain control  Uncontrolled due to severe RA disease  Continue gabapentin  Increase tramadol dose to 2 po q 8 hrs prn pain until gone (through 2/27)  [place] Controlled Substance Database was reviewed without abnormality  Then switch to hydrocodone/apap 10/325 mg po q 8 hours #45  Referral to [facility] pain stressed importance of this and will get utox today      COPD  Not discussed today due to time    Fu 12 weeks or sooner prn  Pain management initial next available    I spent a total of 32 min total including pre intra and post visit activities    Medication adherence and barriers to the treatment plan have been addressed. Opportunities to optimize healthy behaviors have been discussed. Patient / caregiver voiced understanding.      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    Paraesophageal hernia 05/08/2019    Spinal stenosis of lumbar region with neurogenic claudication 09/18/2018    Spondylolisthesis of lumbar region 09/18/2018    Piriformis syndrome of both sides 03/11/2018    Stage 1 mild COPD by GOLD classification (CMS-HCC) 02/25/2017    Rheumatoid arthritis involving both feet (CMS-HCC)     Immunosuppressed status (CMS-HCC) 01/27/2016    Osteomyelitis (CMS-HCC) 01/26/2016    Atrial flutter (CMS-HCC) 12/25/2015    Rheumatoid arthritis (CMS-HCC) 01/11/2015    Anxiety 02/10/2013    Depression 02/10/2013    Functional Abdominal pain 08/29/2012    Gastritis 08/26/2012    GERD (gastroesophageal reflux disease) 08/24/2012    Tobacco use disorder 01/01/2011       HPI:  Bethany Meyer is a pleasant 68 yo female here for follow up.    At our last visit, we added quetiapine for sleep, continued duloxetine for mood support and increased tramadol to 2 po q 8 hours for  pain control    We also added spiriva for copd    Today she notes left hand really painful and swollen.  Haven't trouble sleeping due to pain and inability to use that hand    Is swollen and limits her activities as well as driving.      Last took prednisone about 2 weeks ago, 5 mg daily during that time    Just started the second RA injection last week.  Continues methotrexate    Tramadol doesn't help that much, but still takes it.  Seems to let up some of the numbness feeling in her fingers.    Smokes marijuana for pain relief and appetite.  Not all the time but is trying to get relief.       Review of Systems -  HPI    Exam:  BP 147/94 (BP Site: R Arm, BP Position: Sitting, BP Cuff Size: Medium)  - Pulse 85  - Temp 36.5 ??C (97.7 ??F) (Temporal)  - Resp 19  - Ht 168.9 cm (5' 6.5)  - Wt 58.5 kg (129 lb)  - LMP 05/20/2005  - SpO2 97%  - BMI 20.51 kg/m??     Gen:  Pleasant in moderate distress to pain  Msk:  left wrist with dramatic swelling with tenderness.  Limited ROM, no overt warmth  Laural Benes, PA-C  Supervising physician Gabrielle Dare, MD

## 2023-07-08 NOTE — Unmapped (Signed)
 Good to see you  I'm so sorry RA has not been well controlled  Joint pain and swelling and uncontrolled rheumatoid arthritis   Let's start back prednisone 10 mg daily and don't decrease the dose or stop altogether until you see Rheumatology for a follow up plan  Increase seroquel for insomnia/sleep take 2 tablets before bed.  Can increase to 3 if needed  For pain - when you use up all tramadol, will switch to hydrocodone  I have referred you to pain management here as well
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
## 2023-07-10 ENCOUNTER — Encounter: Admit: 2023-07-10 | Attending: Family | Primary: Family

## 2023-07-11 LAB — OPIATE, URINE, QUANTITATIVE
6-MONOACETYLMRPH: <5 ng/mL (ref <5)
BUPRENORPHINE: <5 ng/mL (ref <5)
CODEINE GC/MS CONF: <25 ng/mL (ref <25)
FENTANYL, URINE GC/MS: <0.5 ng/mL (ref <0.5)
HYDROCODONE GC/MS CONF: <25 ng/mL (ref <25)
HYDROMORPHONE GC/MS CONF: <25 ng/mL (ref <25)
MORPHINE GC/MS CONF: <25 ng/mL (ref <25)
NORBUPRENORPHINE: <5 ng/mL (ref <5)
NORFENTANYL, UR GC/MS: <1 ng/mL (ref <1.0)
OPIATE INTERP: NEGATIVE
OXYCODONE (GC/MS): <25 ng/mL (ref <25)
OXYMORPHONE: <25 ng/mL (ref <25)

## 2023-07-14 NOTE — Unmapped (Signed)
 [facility] Medicine   CARE MANAGEMENT ENCOUNTER           Date of Service:  07/14/2023      Service:  Care Coordination - phone  Is there someone else in the room? No.   MyChart use by patient is active: no    Post-outreach Action Items:  Provider:  pls consider enrolling Bethany Meyer into CCM, I would like to keep following up with her. We had a good hour long talk . Marland Kitchen  CM: N/A.  Patient:  Bethany Meyer is to call [employer] for any programs, await to hear from Legal aide and consider having someone come and evalute her heating and aide units. Bethany Meyer also is out of food stamps and CM sent her an application to reapply and she will also consider applying for MQB.    Purpose of contact:     Care Manager (CM) received request for    I've been meaning to reach out to our social work team for her.  She did tell me about very high light bills and utilities, having to sell a car to pay for it.  I worry there are great barriers, financially and probably transportation.  She had borrowed a friend's care to come to our visit.     Care team - could you reach out to her to see what we could help with?  I should also enroll her in CCM.  She's a bit difficult to get a hold of, not sure if secure text is an option.  She admitted to not being a phone person and often doesn't look at it for days.     :     Care Manager (CM) completed the following related to the above request:  Reviewed chart  Identified resources   [employer]-  Energy Advisors  -  The [employer] Call program is a free evaluation of your home's energy usage. You'll receive free energy efficiency items, a custom energy usage report, low-cost tips and expert recommendations to help you lower your energy bill and add value to your home.   [phone].    Engineer, agricultural -Phone: [phone]   [facility] [place] - online request for services - Online Intake   [place] medicaid - MQB program  [place] Food Stamps Application.  [place] [facility]- [phone]  Contacted Bethany Meyer  and discussed the following  :  Bethany Meyer voices her current biggest issue is unable to afford her electricity bill that is currently running 500-800$ per month and she is unable to keep up with this cost. She voices she has lived in her home for over 40 years but electric bill has skyrocketed in last  three years. She voices she lives alone and is not clear why it is so high. She sold an extra car last month and borrowed from friends this month .     Bethany Meyer is over income for basic medicaid at a monthly SS income of around 1500$ per month. However Bethany Meyer likely will qualify for MQB medicaid which will cover her Bethany Meyer B Medicare monthly cost and this may help her financially. Plan made that Bethany Meyer will apply for MQB medicaid at [facility].  Bethany Meyer also let her Food stamps lapse and she does need to apply for this again. CM has placed in the mail to her a food stamps application and she will reapply.   Overall these options is not going to help  in long run with an electricity bill so high. She voices she tried to call and ask if someone could come out from [employer] but they declined. ( Most places are not doing meter readers anymore and instead its all sent via internet services installed by [employer])  we talked about getting legal aide involved to help her with this bill and over the phone we made a referral to [facility] [place] for their assistance. Per their email response back after we completed the online request:   Per Legal aide   If you appear to be eligible for our services, we will contact you within 2 days to complete the intake process. We will reach out by the preferred phone number you have indicated or the email you have provided. If you have a court date or legal deadline within 5 days please call the Helpline at [phone] between the hours of 8:30 and 4 M-F and 5:30- 8:30 PM Monday and Thursday.    If you appear not to be eligible, you will receive a notification explaining this.   Bethany Meyer was given info to the 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 Encouraged Bethany Meyer to call and see if there is any programs to help her. Specifically a handyman or heating and air cooling person to come check her units. They may be working harder due to a faulty issue and may need to be repaired? If Bethany Meyer does end up needing a new heating unit, we can try to see if there is agencies to help such as  Independent Living   Bethany Meyer to call the [employer] Call program for any options/solutions.    CM called [place] [facility] to see if they have any handyman programs for seniors and they do not.   Bethany Meyer is listed as having no insurance. However Bethany Meyer states she has [employer] - policy number [id number] and same pharmacy plan as before.       Patient was encouraged to reach out to their provider with any questions or concerns.      I provided an intervention for the Financial Resource Strain SDOH domain. The intervention was Provided [employer]     A copy of this Patient Outreach Encounter was sent to patient's Primary Care Provider

## 2023-07-16 NOTE — Unmapped (Signed)
 Left msg/sent text for pt to confirm appt.AE  Hi, Pt was unable to have video visit today. Can you reschedule her with myself in person in the next few weeks?   Thank you,Grace

## 2023-07-17 MED ORDER — HYDROCODONE 10 MG-ACETAMINOPHEN 325 MG TABLET
ORAL_TABLET | Freq: Three times a day (TID) | ORAL | 0 refills | 15.00 days | Status: CP | PRN
Start: 2023-07-17 — End: ?

## 2023-07-25 NOTE — Unmapped (Unsigned)
 [facility] Follow-up      Bethany Meyer    Opioid pain medication agreement last signed: 12/03/2018 (will need to sign agreement today)    Most recent Naloxone [facility]) nasal spray Rx sent: N/A (order pended)    Last PDMP Review: 07/08/2023  9:37 AM    Last Opioid Dispensed Provider: Leda Min, PA    Recent Visits  Date Type Provider Dept   07/08/23 Office Visit Leda Min, [place] [facility] Internal Medicine [facility]   04/29/23 Office Visit Leda Min, [place]       Last Drug Screen Date: 07/08/2023  Urine Toxicology Screen    Lab Results   Component Value Date    AMPHU Negative 07/08/2023    BARBU Negative 07/08/2023    BENZU Negative 07/08/2023    CANNAU Positive (A) 07/08/2023    METHU Negative 07/08/2023    COCAU Negative 07/08/2023    OPIAU Negative 07/08/2023    FENTU Negative 08/22/2020    OXYCOU Negative 07/08/2023    BUPRENORPHIN Negative 07/08/2023        Last Drug Screen Date: 07/11/2023  Opiate Confirmation Test    6-Monoacetylmorphine   Date Value Ref Range Status   07/08/2023 <5 <5 ng/mL Final     Morphine   Date Value Ref Range Status   07/08/2023 <25 <25 ng/mL Final     Codeine   Date Value Ref Range Status   07/08/2023 <25 <25 ng/mL Final     Hydrocodone   Date Value Ref Range Status   07/08/2023 <25 <25 ng/mL Final     Hydromorphone   Date Value Ref Range Status   07/08/2023 <25 <25 ng/mL Final     Oxycodone   Date Value Ref Range Status   07/08/2023 <25 <25 ng/mL Final     Oxymorphone   Date Value Ref Range Status   07/08/2023 <25 <25 ng/mL Final     Buprenorphine   Date Value Ref Range Status   07/08/2023 <5 <5 ng/mL Final     Norbuprenorphine   Date Value Ref Range Status   07/08/2023 <5 <5 ng/mL Final     Fentanyl   Date Value Ref Range Status   07/08/2023 <0.5 <0.5 ng/mL Final     Norfentanyl   Date Value Ref Range Status   07/08/2023 <1.0 <1.0 ng/mL Final       Future Appointments  Date Type Provider Dept 10/07/23 Appointment Leda Min, PA [facility]   Showing future appointments within next 365 days and meeting all other requirements

## 2023-07-28 ENCOUNTER — Ambulatory Visit: Admit: 2023-07-28

## 2023-07-28 NOTE — Unmapped (Signed)
 The [facility] has made a third and final attempt to reach this patient to refill the following medication:ACTEMRA.      We have left voicemails on the following phone numbers: [phone] , have sent a MyChart message, and have sent a text message to the following phone numbers: [phone]  .    Dates contacted: 2/17, 2/24, 3/3  Last scheduled delivery: 06/19/23 for 28 days    The patient may be at risk of non-compliance with this medication. The patient should call the [facility] at [phone]  Option 4, then Option 2: Dermatology, Gastroenterology, Rheumatology to refill medication.    Julianne Rice, PharmD   [facility] Specialty Pharmacist

## 2023-07-30 NOTE — Unmapped (Unsigned)
 Assessment/Plan:   Bethany Meyer is a 68 y.o. 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    ***    Seropositive (-RF, +CCP) non erosive RA   Unfortunately with ongoing disease activity again in setting of not taking any medications. Stopped MTX because she couldn't get folic acid from [facility] and multiple missed/canceled infusions (9 since 2022). We discussed today that due to barriers with keeping infusions appointments, will re-explore an at home biologic. As she has tried 2 TNFis and Xeljanz, we discussed IL6 blockade today. After reviewing dosing and adverse effect profile, she is amenable.  - Restart MTX 25mg  SQ  ---- Continue FA daily (Sent to shared service, she has had issues with this at [facility])  - Start Actemra SQ qweekly  - pred 20mg  x7d, 15mg  x7d, 10mg  x14d, 5mg  x14d (see PCP has sent this in earlier today, agree with this course)  - CBC w/ diff, Cr, hepatic panel, ESR, CRP                   CDAI interpretation:  0.0-2.8 Remission   2.9-10.0 Low disease activity   10.1-22.0 Moderate disease activity   22.1-76.0 High disease activity       Spinal stenosis  Patient with an MRI from 12/24/19 demonstrating severe spinal canal stenosis and foraminal narrowing at L4-5. Now with dramatic improvement after spinal surgery.     Osteoporosis - Fall resulting in right proximal femur greater trochanteric fracture  Dexa 02/2021 with left hip T score -2.7 c/w osteoporosis. Started Fosamax but has self-d/c'd multiple times. Has not taken for a significant continuous period.   - Fosamax qweekly    Tobacco use  She was smoking at least a pack per day, now down to ~8/day.   -- smoking cessation encouraged     Normocytic anemia   --will check CBC today.     Health Care Maintenance:  Routine health maintenance discussed  Immunization History   Administered Date(s) Administered Comments    COVID-19 VAC,BIVALENT(54YR UP),PFIZER 05/03/2021      11/01/2021     COVID-19 VACCINE,MRNA(MODERNA)(PF) 06/29/2019 Historical - Not administered in Epic     07/27/2019 Historical - Not administered in Epic     04/07/2020     Covid-19 Vac, (16yr+) (Comirnaty) Mrna Pfizer  02/27/2023     Covid-19 Vac, (4yr+) (Spikevax) Monovalent Moderna 05/03/2022     DTP 12/22/1962 Historical - Not administered in Epic     02/04/1963 Historical - Not administered in Epic     07/22/1963 Historical - Not administered in Epic    HEPATITIS B VACCINE ADULT,IM(ENERGIX B, RECOMBIVAX) 01/03/2015      03/13/2015     Hepatitis B Vaccine, Unspecified Formulation 08/19/1996 Historical - Not administered in Epic     02/24/1997 Historical - Not administered in Epic    INFLUENZA IIV3 HIGH DOSE 42YRS+(FLUZONE) 02/27/2023     INFLUENZA QUAD HIGH DOSE 42YRS+(FLUZONE) 03/03/2019     Influenza Vaccine Quad(IM)6 MO-Adult(PF) 02/10/2013      05/03/2014      03/13/2015      03/20/2016      07/12/2017      03/11/2018      02/14/2020      05/03/2021      02/01/2022     Influenza Virus Vaccine, unspecified formulation 02/10/2013      05/03/2014      03/13/2015  03/20/2016      07/12/2017      03/11/2018     MMR 12/07/1998 Historical - Not administered in Epic     12/20/1998 Historical - Not administered in Epic     04/04/1999 Historical - Not administered in Epic    Meningococcal Conjugate MCV4P 05/03/2021     OPV 07/19/1962 Historical - Not administered in Epic     09/13/1962 Historical - Not administered in Epic     12/22/1962 Historical - Not administered in Epic    PNEUMOCOCCAL POLYSACCHARIDE 23-VALENT 01/11/2015     Pneumococcal Conjugate 20-valent 11/01/2021     Pneumococcal conjugate -PCV7 02/18/2012     TD(TDVAX),ADSORBED,2LF(IM)(PF) 12/07/1998 Historical - Not administered in Epic    TdaP 01/01/2011    Deferred Date(s) Deferred Comments    Influenza Vaccine Quad(IM)6 MO-Adult(PF) 05/17/2019      06/24/2019 Pt remember she had a dose already last year. See immunization record.       F/U as scheduled in May with Dr. Tomasa Meyer        History of Present Illness:     Primary Care Provider: Leda Min, PA    HPI:  Bethany Meyer is a 68 y.o.  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    Last Visit: 03/2023 with Dr. Tomasa Meyer. Actemra started at this visit due to difficulty getting to infusions. MTX also restarted.     Today    MTX?  FA?      Actemra?    Pred?    Joints?  Swelling?  AM stiffness?    Pt score?    Fever?  Illness?  Infections?         As background  Bethany Meyer's joint pain first started around May/June 2016 with right knee pain and swelling, then right foot and ankle swelling. She is RF negative, CCP positive. She was started on methotrexate but had persistent joint pain and swelling so was started on Humira in March 2017. She is on methotrexate 20mg  weekly po (have been unable to get SQ approved by insurance).  She was increased to weekly Humira but continued to have joint pain and swelling.  Enbrel was not approved by insurance so prescription was sent for Bethany Meyer instead in 2018. Bethany Meyer was initially effective but the patient experienced several flares while on this medication. This was discontinued in May 2019 and she was started on Cimzia in June 2019. Cimzia was discontinued in summer 2020 due to lack of perceived benefit from medication. She was on Rituximab with first infusion 06/2019, she has had sporadic dosing schedule due to issues with transportation and following up.    Ritux 1g 06/2019, 12/30/19, 01/13/20, 12/20/20, 04/30/22. Due to multiple missed infusion appointments, we switched her to Actemra 03/2023. Rituximab was effective.    Balance of 10 systems was reviewed and is negative except for that mentioned in the HPI.    Review of records: I have reviewed labs/images/clinic notes per the computerized medical record.   X-ray: IMPRESSION:  -Subtle cortical irregularity at the second middle phalanx at the PIP joint, which is only visualized on oblique views and may be artifactual. Recommend correlation with point tenderness if fracture in this region is suspected.    Allergies:  Patient has no known allergies.    Medications:     Current Outpatient Medications:     acetaminophen (TYLENOL) 325 MG tablet, Take 1 tablet (  325 mg total) by mouth two (2) times a day as needed for pain., Disp: , Rfl:     alendronate (FOSAMAX) 70 MG tablet, TAKE 1 TABLET BY MOUTH EVERY SEVEN DAYS., Disp: 12 tablet, Rfl: 3    arm brace Misc, Boxer splint, metacarpal wrist brace, Disp: 1 each, Rfl: 0    aspirin 81 MG chewable tablet, CHEW 1 TABLET BY MOUTH DAILY (Patient not taking: Reported on 07/08/2023), Disp: 36 tablet, Rfl: 3    buPROPion (WELLBUTRIN XL) 150 MG 24 hr tablet, Take 1 tablet (150 mg total) by mouth every morning., Disp: 90 tablet, Rfl: 2    dicyclomine (BENTYL) 20 mg tablet, Take 1 tablet (20 mg total) by mouth four times a day as needed (stomach cramps)., Disp: 360 tablet, Rfl: 3    DULoxetine (CYMBALTA) 30 MG capsule, Take 1 capsule (30 mg total) by mouth daily., Disp: 90 capsule, Rfl: 3    empty container Misc, Use as directed, Disp: 1 each, Rfl: 2    folic acid (FOLVITE) 1 MG tablet, Take 1 tablet (1,000 mcg total) by mouth daily., Disp: 90 tablet, Rfl: 3    gabapentin (NEURONTIN) 300 MG capsule, 3 capsules po morning and midday, 4 capsules po qhs, Disp: 360 capsule, Rfl: 3    HYDROcodone-acetaminophen (NORCO 10-325) 10-325 mg per tablet, Take 1 tablet by mouth every eight (8) hours as needed for pain., Disp: 45 tablet, Rfl: 0    hydrOXYzine (ATARAX) 10 MG tablet, Take 1 tablet (10 mg total) by mouth every six (6) hours as needed for itching., Disp: 270 tablet, Rfl: 1    leg brace (KNEE SUPPORT BRACE) Misc, 1 each by Miscellaneous route daily as needed., Disp: 1 each, Rfl: 0    methotrexate, PF, (RASUVO, PF,) 25 mg/0.5 mL [name], Inject the contents of 1 auto-injector (25 mg total) under the skin every seven (7) days., Disp: 2 mL, Rfl: 5    needle, disp, 25 gauge 25 gauge x 5/8 Ndle, For methotrexate injections, Disp: 100 each, Rfl: 0    predniSONE (DELTASONE) 5 MG tablet, 10 mg daily do not stop abruptly follow up with Rheumatology before tapering, Disp: 60 tablet, Rfl: 0    QUEtiapine (SEROQUEL) 25 MG tablet, 2 tablets before bed, increase to 3 tablets before bed if needed, Disp: 270 tablet, Rfl: 1    SYMBICORT 80-4.5 mcg/actuation inhaler, INHALE 2 PUFFS IN THE MORNING AND IN THE EVENING, Disp: , Rfl:     tocilizumab 162 mg/0.9 mL PnIj, Inject the contents of 1 pen (162 mg) under the skin once a week., Disp: 10.8 mL, Rfl: 3    umeclidinium (INCRUSE ELLIPTA) 62.5 mcg/actuation inhaler, Inhale 1 puff daily., Disp: 30 each, Rfl: 2    VITAMIN B-1 100 mg tablet, Take 1 tablet (100 mg total) by mouth daily., Disp: 100 tablet, Rfl: 3    Medical History:  Past Medical History:   Diagnosis Date    Arthritis     RA    Cataract     Functional dyspepsia     GERD (gastroesophageal reflux disease)     Gout     RA (rheumatoid arthritis) (CMS-HCC)     Smoking     Stage 1 mild COPD by GOLD classification (CMS-HCC) 02/25/2017       Surgical History:  Past Surgical History:   Procedure Laterality Date    ovarian cyst removed      OVARY SURGERY      PR COLONOSCOPY FLX DX W/COLLJ SPEC WHEN  PFRMD N/A 03/05/2013    Procedure: COLONOSCOPY, FLEXIBLE, PROXIMAL TO SPLENIC FLEXURE; DIAGNOSTIC, W/WO COLLECTION SPECIMEN BY BRUSH OR WASH;  Surgeon: Trudie Buckler, MD;  Location: GI PROCEDURES [facility];  Service: Gastroenterology    PR COMPRE EP EVAL ABLTJ 3D MAPG TX SVT N/A 12/26/2015    Procedure: A-Flutter Ablation;  Surgeon: Theresia Majors, MD;  Location: [facility];  Service: Cardiology    PR UP GI ENDOSCOPY,BALL DIL,30MM N/A 06/03/2019    Procedure: UGI ENDO; W/BALLOON DILAT ESOPHAGUS (<30MM DIAM);  Surgeon: Neysa Hotter, MD;  [facility];  Service: Gastroenterology    PR UPPER GI ENDOSCOPY,BIOPSY N/A 09/16/2012    Procedure: UGI ENDOSCOPY; WITH BIOPSY, SINGLE OR MULTIPLE;  Surgeon: Brown Human, MD;  [facility]: [facility];  Service: Gastroenterology    PR UPPER GI ENDOSCOPY,BIOPSY N/A 06/03/2019    Procedure: UGI ENDOSCOPY; WITH BIOPSY, SINGLE OR MULTIPLE;  Surgeon: Neysa Hotter, MD;  Location: [facility];  Service: Gastroenterology    PR UPPER GI ENDOSCOPY,DIAGNOSIS N/A 06/03/2019    Procedure: UGI ENDO, INCLUDE ESOPHAGUS, STOMACH, & DUODENUM &/OR JEJUNUM; DX W/WO COLLECTION SPECIMN, BY BRUSH OR WASH;  Surgeon: Neysa Hotter, MD;  Location: [facility] PROCED[facility];  Service: Gastroenterology    PR XCAPSL CTRC RMVL INSJ IO LENS PROSTH CPLX WO ECP Right 05/29/2022    Procedure: EXTRACAPSULAR CATARACT REMOVAL WITH INSERTION OF INTRAOCULAR LENS PROSTHESIS, COMPLEX WITHOUT ENDOSCOPIC CYCLOPHOTOCOAGULATION;  Surgeon: Garner Gavel, MD;  Location: [facility];  Service: Ophthalmology    PR XCAPSL CTRC RMVL INSJ IO LENS PROSTH W/O ECP Left 07/03/2022    Procedure: EXTRACAPSULAR CATARACT REMOVAL W/INSERTION OF INTRAOCULAR LENS PROSTHESIS, MANUAL OR MECHANICAL TECHNIQUE WITHOUT ENDOSCOPIC CYCLOPHOTOCOAGULATION;  Surgeon: Garner Gavel, MD;  [facility];  Service: Ophthalmology       Social History:  Social History     Tobacco Use    Smoking status: Every Day     Current packs/day: 1.00     Average packs/day: 1 pack/day for 35.0 years (35.0 ttl pk-yrs)     Types: Cigarettes    Smokeless tobacco: Never    Tobacco comments:     Started smoking at 33,    Vaping Use    Vaping status: Some Days   Substance Use Topics    Alcohol use: No     Alcohol/week: 0.0 standard drinks of alcohol    Drug use: Yes     Types: Marijuana     Comment: once a month       Family History:  Family History   Problem Relation Age of Onset    Diabetes Mother type 2    Breast cancer Mother     Diabetes Father         type 2    Cataracts Father     Macular degeneration Neg Hx     Retinal detachment Neg Hx     Colon cancer Neg Hx     Endometrial cancer Neg Hx     Ovarian cancer Neg Hx     Glaucoma Neg Hx     Blindness Neg Hx            Objective   There were no vitals filed for this visit.            Physical Exam:***  General: Well-appearing.  HEENT: Sclera anicteric;   Pulmonary: Unremarkable respiratory effort.   Neuro:  Moving all four extremities spontaneously, able to walk to the exam table independently   Psych: Euthymic affect.  MSK:     Hands/Wrists: Swelling with bogginess/warmth of L wrist and 2-3 MCP and 2-4 PIPs as well as R 2nd MCP and 3rd PIP. Otherwise no bogginess/warmth of any hand joint. Hand grip weaker on L and than R.  Elbows: No swelling or tenderness to palpation of the elbows.  Shoulders: No swelling or tenderness to palpation of the bilateral shoulders.  Knees: No swelling or tenderness to palpation of the bilateral knees.  Ankles: No swelling or tenderness to palpation of the bilateral ankles.  Feet: Tenderness to palpation of the lateral hips.   Skin: No evidence of erythema or rash.  Extremities: Warm, no LE edema.    I personally spent *** minutes face-to-face and non-face-to-face in the care of this patient, which includes all pre, intra, and post visit time on the date of service.  All documented time was specific to the E/M visit and does not include any procedures that may have been performed.

## 2023-08-01 ENCOUNTER — Ambulatory Visit: Admit: 2023-08-01 | Attending: Family | Primary: Family

## 2023-08-02 MED ORDER — HYDROXYZINE HCL 10 MG TABLET
ORAL_TABLET | Freq: Four times a day (QID) | ORAL | 1 refills | 0.00 days | PRN
Start: 2023-08-02 — End: ?

## 2023-08-04 MED ORDER — HYDROXYZINE HCL 10 MG TABLET
ORAL_TABLET | Freq: Four times a day (QID) | ORAL | 1 refills | 68.00 days | Status: CP | PRN
Start: 2023-08-04 — End: 2024-07-07

## 2023-08-04 NOTE — Unmapped (Signed)
 Per interface/mychart refill request.  Patient last seen by PCP on 07/08/2023

## 2023-08-10 DIAGNOSIS — M069 Rheumatoid arthritis, unspecified: Principal | ICD-10-CM

## 2023-08-12 NOTE — Unmapped (Signed)
 [facility] - Enhanced Care Program  Reason for Call: Medication Access; Type: SSC Refill Call  Referral Request: Rheumatology - Sheliah Mends, CPP    Summary of Telephone Encounter  [facility] has made several attempts to reach pt to coordinate refill of Actemra. Pt's last scheduled delivery was on 06/19/2023 for a 30 day supply.   I called pt at phone # [phone] but had to leave a VM to please return my call. Contact number provided. I also called pt's daughter Delorise Jackson at phone # [phone] and left a VM request to please return my call.   I sent pt a MY CHART message with request to please return my call.   Follow-Up:  Continue to reach out to pt to assist with refill coordination of Actemra.     Call Attempt #: 1  Time Spent on Referral: 15 minutes  Number of Days Spent on Referral: 1    Aldean Ast, [facility]   Pharmacy Department  [facility]  [address]   [place], [place] [address]  [phone]

## 2023-08-13 NOTE — Unmapped (Signed)
 [facility] - Enhanced Care Program  Reason for Call: Medication Access; Type: SSC Refill Call  Referral Request: Rheumatology - Sheliah Mends, CPP    Summary of Telephone Encounter  [facility] has made several attempts to reach pt to coordinate refill of Actemra. Pt's last scheduled delivery was on 06/19/2023 for a 30 day supply.   I called and was able to reach pt at phone # [phone]. She said she has 2 injections left. She took her last dose on Thursday the 13th. She said she has been sick so she has not taken another dose. She said she has not been eating much. She reports nausea, cramping and constipation which has improved but not much appetitive. She is keeping fluids down, and able to eat soup and a few other things. I encouraged pt to make an apt with her PCP so she can be seen. Pt stated her daughter and sister have been checking on her.   Pt would like to hold off on her refill until she is feeling better. We will call again in 1.5 wks to assist with refill.    Follow-Up:  Reach out to pt in 1.5 wks to see if she is ready to schedule refill for Actemra, and also check to see how pt is feeling.     Call Attempt #: 2  Time Spent on Referral: 15 minutes  Number of Days Spent on Referral: 2    Vertell Limber RN, BSN  [facility]   Pharmacy Department  [facility]  [address]   [place], [place] [address]  [phone]

## 2023-08-18 ENCOUNTER — Inpatient Hospital Stay: Admit: 2023-08-18 | Discharge: 2023-08-19

## 2023-08-18 ENCOUNTER — Ambulatory Visit: Admit: 2023-08-18 | Discharge: 2023-08-19

## 2023-08-18 DIAGNOSIS — R059 Cough, unspecified type: Principal | ICD-10-CM

## 2023-08-18 DIAGNOSIS — R112 Nausea with vomiting, unspecified: Principal | ICD-10-CM

## 2023-08-18 DIAGNOSIS — E860 Dehydration: Principal | ICD-10-CM

## 2023-08-18 DIAGNOSIS — G4700 Insomnia, unspecified: Principal | ICD-10-CM

## 2023-08-18 DIAGNOSIS — D75839 Thrombocytosis: Principal | ICD-10-CM

## 2023-08-18 DIAGNOSIS — M5431 Sciatica, right side: Principal | ICD-10-CM

## 2023-08-18 DIAGNOSIS — K5900 Constipation, unspecified: Principal | ICD-10-CM

## 2023-08-18 DIAGNOSIS — F32A Depression, unspecified depression type: Principal | ICD-10-CM

## 2023-08-18 DIAGNOSIS — M069 Rheumatoid arthritis, unspecified: Principal | ICD-10-CM

## 2023-08-18 LAB — CBC W/ AUTO DIFF
BASOPHILS ABSOLUTE COUNT: 0.1 10*9/L (ref 0.0–0.1)
BASOPHILS RELATIVE PERCENT: 1.2 %
EOSINOPHILS ABSOLUTE COUNT: 0.1 10*9/L (ref 0.0–0.5)
EOSINOPHILS RELATIVE PERCENT: 1.3 %
HEMATOCRIT: 35.3 % (ref 34.0–44.0)
HEMOGLOBIN: 12 g/dL (ref 11.3–14.9)
LYMPHOCYTES ABSOLUTE COUNT: 2.1 10*9/L (ref 1.1–3.6)
LYMPHOCYTES RELATIVE PERCENT: 19.5 %
MEAN CORPUSCULAR HEMOGLOBIN CONC: 33.9 g/dL (ref 32.0–36.0)
MEAN CORPUSCULAR HEMOGLOBIN: 32.3 pg (ref 25.9–32.4)
MEAN CORPUSCULAR VOLUME: 95.3 fL (ref 77.6–95.7)
MEAN PLATELET VOLUME: 7.4 fL (ref 6.8–10.7)
MONOCYTES ABSOLUTE COUNT: 0.8 10*9/L (ref 0.3–0.8)
MONOCYTES RELATIVE PERCENT: 7.9 %
NEUTROPHILS ABSOLUTE COUNT: 7.5 10*9/L (ref 1.8–7.8)
NEUTROPHILS RELATIVE PERCENT: 70.1 %
PLATELET COUNT: 688 10*9/L — ABNORMAL HIGH (ref 150–450)
RED BLOOD CELL COUNT: 3.7 10*12/L — ABNORMAL LOW (ref 3.95–5.13)
RED CELL DISTRIBUTION WIDTH: 17.6 % — ABNORMAL HIGH (ref 12.2–15.2)
WBC ADJUSTED: 10.7 10*9/L (ref 3.6–11.2)

## 2023-08-18 LAB — COMPREHENSIVE METABOLIC PANEL
ALBUMIN: 2.9 g/dL — ABNORMAL LOW (ref 3.4–5.0)
ALKALINE PHOSPHATASE: 71 U/L (ref 46–116)
ALT (SGPT): 9 U/L — ABNORMAL LOW (ref 10–49)
ANION GAP: 8 mmol/L (ref 5–14)
AST (SGOT): 16 U/L (ref <=34)
BILIRUBIN TOTAL: 0.2 mg/dL — ABNORMAL LOW (ref 0.3–1.2)
BLOOD UREA NITROGEN: 15 mg/dL (ref 9–23)
BUN / CREAT RATIO: 15
CALCIUM: 9.4 mg/dL (ref 8.7–10.4)
CHLORIDE: 108 mmol/L — ABNORMAL HIGH (ref 98–107)
CO2: 26.3 mmol/L (ref 20.0–31.0)
CREATININE: 1 mg/dL (ref 0.55–1.02)
EGFR CKD-EPI (2021) FEMALE: 62 mL/min/1.73m2 (ref >=60)
GLUCOSE RANDOM: 78 mg/dL (ref 70–179)
POTASSIUM: 5.2 mmol/L — ABNORMAL HIGH (ref 3.4–4.8)
PROTEIN TOTAL: 7.3 g/dL (ref 5.7–8.2)
SODIUM: 142 mmol/L (ref 135–145)

## 2023-08-18 LAB — PERIPHERAL BLOOD SMEAR, PATH REVIEW

## 2023-08-18 LAB — SEDIMENTATION RATE: ERYTHROCYTE SEDIMENTATION RATE: 115 mm/h — ABNORMAL HIGH (ref 0–30)

## 2023-08-18 LAB — C-REACTIVE PROTEIN: C-REACTIVE PROTEIN: 9.2 mg/L (ref <=10.0)

## 2023-08-18 MED ORDER — BUPROPION HCL XL 150 MG 24 HR TABLET, EXTENDED RELEASE
ORAL_TABLET | Freq: Every morning | ORAL | 0 refills | 90 days | Status: CP
Start: 2023-08-18 — End: ?

## 2023-08-18 MED ORDER — DULOXETINE 30 MG CAPSULE,DELAYED RELEASE
ORAL_CAPSULE | Freq: Every day | ORAL | 0 refills | 90 days | Status: CP
Start: 2023-08-18 — End: 2023-11-16

## 2023-08-18 MED ORDER — POLYETHYLENE GLYCOL 3350 17 GRAM ORAL POWDER PACKET
PACK | Freq: Every day | ORAL | 0 refills | 30 days | Status: CP
Start: 2023-08-18 — End: ?

## 2023-08-18 MED ORDER — SENNOSIDES 8.6 MG-DOCUSATE SODIUM 50 MG TABLET
ORAL_TABLET | Freq: Every evening | ORAL | 0 refills | 30 days | Status: CP
Start: 2023-08-18 — End: 2023-09-17

## 2023-08-18 MED ORDER — ONDANSETRON HCL 4 MG TABLET
ORAL_TABLET | Freq: Three times a day (TID) | ORAL | 1 refills | 10 days | Status: CP | PRN
Start: 2023-08-18 — End: 2024-08-17

## 2023-08-18 MED ORDER — QUETIAPINE 25 MG TABLET
ORAL_TABLET | 0 refills | 0 days | Status: CP
Start: 2023-08-18 — End: ?

## 2023-08-18 MED ORDER — PREDNISONE 10 MG TABLET
ORAL_TABLET | ORAL | 0 refills | 70 days | Status: CP
Start: 2023-08-18 — End: 2023-10-27

## 2023-08-18 MED ORDER — LACTATED RINGERS IV BOLUS
Freq: Once | INTRAVENOUS | 0 refills | 1 days | Status: CP
Start: 2023-08-18 — End: 2023-08-18

## 2023-08-18 MED ORDER — GABAPENTIN 300 MG CAPSULE
ORAL_CAPSULE | 0 refills | 0 days | Status: CP
Start: 2023-08-18 — End: ?

## 2023-08-18 MED ADMIN — lactated Ringers infusion 1,000 mL: 1000 mL | INTRAVENOUS | @ 16:00:00 | Stop: 2023-08-18

## 2023-08-18 NOTE — Unmapped (Signed)
 Hydration was given 100 ml LR 24 g needle was placed patient tolerated it well.

## 2023-08-18 NOTE — Unmapped (Signed)
 Vitals:    08/18/23 0936 08/18/23 1014 08/18/23 1015 08/18/23 1016   Orthostatic BP:  125/80 130/86 138/84   Orthostatic Pulse:  69 77 81   BP Site: L Arm L Arm L Arm L Arm   BP Position: Sitting Supine Sitting Standing   BP Cuff Size: Small Medium Medium Medium

## 2023-08-18 NOTE — Unmapped (Addendum)
 Your symptoms are likely due to abruptly stopping your gabapentin, a flare of your rheumatoid arthritis, and functional abdominal pain. Stopping your other medications and/or an infection may also be playing a role.  -We are getting labs and x-rays today.  -Resume your gabapentin, bupropion, duloxetine, quetiapine as prescribed. I have sent refills to your pharmacy.  -I have sent a prednisone taper to your pharmacy. Please take as prescribed.  -I have sent Miralax and Senna for constipation, and Zofran for nausea. Please take as prescribed.  -Resume your methotrexate, folic acid, and Acterma as previously prescribed by rheumatology.

## 2023-08-18 NOTE — Unmapped (Addendum)
 [facility] SAME DAY CLINIC NOTE     08/18/2023    PCP: Leda Min, PA     Assessment and Plan    [name] was seen today for vomiting and generalized body aches.    Diagnoses and all orders for this visit:    Dehydration  Nausea/Vomiting  Rheumatoid Arthritis Flare  Cough  Functional Abdominal Pain  Patient presents with approximately 10 days of nausea/vomiting, decreased p.o. intake, cough, diffuse myalgias, night sweats, constipation.  The symptoms began after she abruptly stopped taking her gabapentin 2 to 3 days prior.  She was previously on 3000 mg daily (900/900/1200) of gabapentin.  Symptoms are consistent with gabapentin withdrawal and/or rheumatoid arthritis flare.  Viral URI, superimposed constipation also possible playing a role.  She was previously hospitalized at [facility] back in 2021 with similar symptoms (discharged 08/31/19); thought to be secondary to functional abdominal pain vs cannabis hyperemesis, and she improved with supportive care. Now reports no recent use of cannabis.  She also abruptly stopped taking her Wellbutrin and Cymbalta since the nausea/vomiting started; withdrawal from these could be contributing to worsening of her symptoms.    -CBC, CMP, ESR, CRP  -CXR, KUB  -1 L LR bolus in clinic given dehydration  -Refilled gabapentin, Cymbalta, Wellbutrin, Seroquel  -Continue Bentyl PRN  -Miralax daily, Senna nightly  -Zofran PRN  -Prednisone Taper: 20 mg x5 days, 10 mg x5 days, 5 mg indefinitely until follow up with rheumatology  -Continue methotrexate and Acterma as prescribed by rheumatology.     Follow up as scheduled or sooner as needed.    Patient was seen and discussed with Dr. Leta Baptist who is in agreement with the assessment and plan as outlined above.       Subjective    Problem List:  Patient Active Problem List   Diagnosis    Tobacco use disorder    GERD (gastroesophageal reflux disease)    Gastritis    Functional Abdominal pain    Anxiety Depression    Rheumatoid arthritis    Atrial flutter    Immunosuppressed status    Rheumatoid arthritis involving both feet    Stage 1 mild COPD by GOLD classification    Piriformis syndrome of both sides    Spinal stenosis of lumbar region with neurogenic claudication    Spondylolisthesis of lumbar region    Duodenal ulcer disease    Paraesophageal hernia    Osteomyelitis    Slow transit constipation    Combined forms of age-related cataract of both eyes       HPI  Bethany Meyer is a 72F w/ hx RA (methotrexate, Acterma), insomnia/depression (on duloxetine, buproprion, quietapine), chronic pain/neuropathy (gabapentin, tramadol).    She reports that she ran out of her gabapentin 14 days ago.  Subsequently, around 10 days ago, she began to have vomiting every time she eats.  She also began to have worsening diffuse myalgias, neuropathy, cough with some sputum, intermittent night sweats/chills around this time.  She subsequently stopped taking the rest of her medications, including fluoxetine, bupropion, quetiapine, methotrexate.    She feels constipated, has a bowel movement around every 2 days.  The last time she threw up was yesterday morning.  She is able to get down fluids and applesauce, but little to no solid food.  She reports her mouth is dry.  She is able to take some medications, but this is still somewhat limited by nausea.    She had a similar episode in the  past, and was hospitalized at [facility] in 2021.  These records were reviewed.      No urinary symptoms, congestion, shortness of breath, measured fevers.    No recent alcohol, marijuana, or other drug use.  She does smoke tobacco, less than a pack per day.    Meds and allergies were reviewed in Epic    ROS: 10 point ROS was performed and is otherwise negative other than mentioned in the HPI    Objective  PE:  Vitals:    08/18/23 0936   BP: 98/69   Pulse: 78   Temp: 36.5 ??C (97.7 ??F)   SpO2: 99%     General: well-appearing in NAD  Eyes: EOMI, sclera clear, PERRL  ENT: OP clear w/o erythema or exudate, dry mucous membranes  CV: regular, no murmurs  Resp: Rhonchi present bilaterally which clears on coughing, otherwise clear to auscultation bilaterally  GI: Mildly tender throughout, no rebound or guarding.  MSK: full ROM, no deformity noted  Skin: clean and dry, no rashes or lesions noted  Ext: no cyanosis/clubbing/edema  Neuro: alert, follows commands. CN II-XII grossly intact    Procedure: None  See procedure note from this encounter    _______________________________________________________________________________________      Same Day Metric Tracker:    Same Day Metric Tracker:  Referral source for today's visit:  General Internal Medicine    Referral to other outpatient urgent services? N/A    Did today's visit result in referral to ED or direct admission? No

## 2023-08-19 NOTE — Unmapped (Signed)
 I reviewed with the resident the medical history and the resident???s findings on physical examination and I examined the patient.  I discussed with the resident the patient???s diagnosis and concur with the treatment plan as documented in the resident note. Thea Alken, MD

## 2023-08-26 NOTE — Unmapped (Signed)
 [facility] - Enhanced Care Program  Reason for Call: Medication Access; Type: SSC Refill Call  Referral Request: Rheumatology - Elden Greenhouse, CPP    Summary of Telephone Encounter  The [facility] has made several attempts to reach pt to coordinate refill of Actemra. Pt's last scheduled delivery was on 06/19/2023 for a 30 day supply.   I called pt to assist with refill coordination of Actemra. I spoke with pt on 08/13/2023 and she was not feeling well at that time and wanted to hold off on refill for a couple week. Pt was able to get into see her PCP on 08/18/2023.   I left pt a VM request to please return my call. Contact number provided.     Follow-Up:  Continue to reach out to pt to see if she is ready to schedule refill for Actemra.    Call Attempt #: 3  Time Spent on Referral: 20 minutes  Number of Days Spent on Referral: 3    Dorethia Ganser RN, [facility]   Pharmacy Department  [facility]  [address]   [place], [place] [address]  [phone]

## 2023-08-28 NOTE — Unmapped (Signed)
 [facility] - Enhanced Care Program  Reason for Call: Medication Access; Type: SSC Refill Call  Referral Request: Rheumatology - Elden Greenhouse, CPP    Summary of Telephone Encounter  The [facility] has made several attempts to reach pt to coordinate refill of Actemra. Pt's last scheduled delivery was on 06/19/2023 for a 30 day supply.   I called pt to assist with refill coordination of Actemra. I spoke with pt on 08/13/2023 and she was not feeling well at that time and wanted to hold off on refill for a couple week. Pt was able to get into see her PCP on 08/18/2023.   I left pt another VM request to please return my call. Contact number provided.     Follow-Up:  Continue to reach out to pt to see if she is ready to schedule refill for Actemra.    Call Attempt #: 4  Time Spent on Referral: 20 minutes  Number of Days Spent on Referral: 4    Dorethia Ganser RN, [facility]   Pharmacy Department  [facility]  [address]   [place], [place] [address]  [phone]

## 2023-09-01 NOTE — Unmapped (Signed)
  Pharmacy - Enhanced Care Program  Reason for Call: Medication Access; Type: SSC Refill Call  Referral Request: Rheumatology - Elden Greenhouse, CPP    Summary of Telephone Encounter  The [facility] has made several attempts to reach pt to coordinate refill of Actemra . Pt's last scheduled delivery was on 06/19/2023 for a 30 day supply.   I called pt at [phone] to assist with refill coordination of Actemra .I left her a message to please call [facility] at [phone]  Option 4, then Option 2.     Follow-Up:  We have attempting to reach patient a total of 5 times.  Will notify Elden Greenhouse, CPP and referral will be closed.      Call Attempt #: 5  Time Spent on Referral: 25 minutes  Number of Days Spent on Referral: 5    Holland Lundborg, Pharm.D.  Pharmacy Department  [facility]  [address]  [place], [place] [address]   [phone]

## 2023-09-09 NOTE — Unmapped (Signed)
 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  Pt states she was off medication for 2 weeks as she was sick but now back on it    Bethany Meyer, DOB: 23-Sep-1955  Phone: [phone] (home)     All above HIPAA information was verified with patient.     Was a Nurse, learning disability used for this call? No    Specialty Medication(s):   Inflammatory Disorders: Actemra and Rasuvo     Current Outpatient Medications   Medication Sig Dispense Refill    acetaminophen (TYLENOL) 325 MG tablet Take 1 tablet (325 mg total) by mouth two (2) times a day as needed for pain.      alendronate (FOSAMAX) 70 MG tablet TAKE 1 TABLET BY MOUTH EVERY SEVEN DAYS. 12 tablet 3    arm brace Misc Boxer splint, metacarpal wrist brace 1 each 0    aspirin 81 MG chewable tablet CHEW 1 TABLET BY MOUTH DAILY (Patient not taking: Reported on 07/08/2023) 36 tablet 3    buPROPion (WELLBUTRIN XL) 150 MG 24 hr tablet Take 1 tablet (150 mg total) by mouth every morning. 90 tablet 0    dicyclomine (BENTYL) 20 mg tablet Take 1 tablet (20 mg total) by mouth four times a day as needed (stomach cramps). 360 tablet 3    DULoxetine (CYMBALTA) 30 MG capsule Take 1 capsule (30 mg total) by mouth daily. 90 capsule 0    empty container Misc Use as directed 1 each 2    folic acid (FOLVITE) 1 MG tablet Take 1 tablet (1,000 mcg total) by mouth daily. 90 tablet 3    gabapentin (NEURONTIN) 300 MG capsule 3 capsules po morning and midday, 4 capsules po qhs 360 capsule 0    HYDROcodone-acetaminophen (NORCO 10-325) 10-325 mg per tablet Take 1 tablet by mouth every eight (8) hours as needed for pain. 45 tablet 0    hydrOXYzine (ATARAX) 10 MG tablet Take 1 tablet (10 mg total) by mouth every six (6) hours as needed for itching. 270 tablet 1    leg brace (KNEE SUPPORT BRACE) Misc 1 each by Miscellaneous route daily as needed. 1 each 0    methotrexate, PF, (RASUVO, PF,) 25 mg/0.5 mL [name] Inject the contents of 1 auto-injector (25 mg total) under the skin every seven (7) days. 2 mL 5    needle, disp, 25 gauge 25 gauge x 5/8 Ndle For methotrexate injections 100 each 0    ondansetron (ZOFRAN) 4 MG tablet Take 1 tablet (4 mg total) by mouth every eight (8) hours as needed for nausea. 30 tablet 1    polyethylene glycol (MIRALAX) 17 gram packet Take 17 g by mouth daily. 30 packet 0    predniSONE (DELTASONE) 10 MG tablet Take 2 tablets (20 mg total) by mouth daily for 5 days, THEN 1 tablet (10 mg total) daily for 5 days, THEN 0.5 tablets (5 mg total) daily. 45 tablet 0    predniSONE (DELTASONE) 5 MG tablet 10 mg daily do not stop abruptly follow up with Rheumatology before tapering 60 tablet 0    QUEtiapine (SEROQUEL) 25 MG tablet 2 tablets before bed, increase to 3 tablets before bed if needed 270 tablet 0    senna-docusate (PERICOLACE) 8.6-50 mg Take 2 tablets by mouth nightly. 60 tablet 0    SYMBICORT 80-4.5 mcg/actuation inhaler INHALE 2 PUFFS IN THE MORNING AND IN THE EVENING      tocilizumab 162 mg/0.9  mL PnIj Inject the contents of 1 pen (162 mg) under the skin once a week. 10.8 mL 3    umeclidinium (INCRUSE ELLIPTA ) 62.5 mcg/actuation inhaler Inhale 1 puff daily. 30 each 2    VITAMIN B-1 100 mg tablet Take 1 tablet (100 mg total) by mouth daily. 100 tablet 3     No current facility-administered medications for this visit.        Changes to medications: [name]    Medication list has been reviewed and updated in Epic: Yes    No Known Allergies    Changes to allergies: No    Allergies have been reviewed and updated in Epic: Yes    SPECIALTY MEDICATION ADHERENCE       Medication Adherence    Patient reported X missed doses in the last month: 3-4  Specialty Medication: Actemra   Patient is on additional specialty medications: Yes  Additional Specialty Medications: Rasuvo   Patient Reported Additional Medication X Missed Doses in the Last Month: 3-4  Patient is on more than two specialty medications: No  Informant: patient          Specialty medication(s) dose(s) confirmed: Regimen is correct and unchanged.     Are there any concerns with adherence? Yes: pt missed doses due to being sick, reviewed with MD    Adherence counseling provided? Not needed    CLINICAL MANAGEMENT AND INTERVENTION      Clinical Benefit Assessment:    Do you feel the medicine is effective or helping your condition? Yes    Clinical Benefit counseling provided? Progress note from 07/10/23 shows evidence of clinical benefit    Adverse Effects Assessment:    Are you experiencing any side effects? No    Are you experiencing difficulty administering your medicine? No    Quality of Life Assessment:    Quality of Life    Rheumatology  Oncology  Dermatology  Cystic Fibrosis          How many days over the past month did your RA  keep you from your normal activities? For example, brushing your teeth or getting up in the morning. Feeling ok now     Have you discussed this with your provider? Not needed    Acute Infection Status:    Acute infections noted within Epic:  No active infections    Patient reported infection: None    Therapy Appropriateness:    Is therapy appropriate based on current medication list, adverse reactions, adherence, clinical benefit and progress toward achieving therapeutic goals? Yes, therapy is appropriate and should be continued     Clinical Intervention:    Was an intervention completed as part of this clinical assessment? No    DISEASE/MEDICATION-SPECIFIC INFORMATION      For patients on injectable medications: Patient currently has 0 doses left.  Next injection is scheduled for Saturday 4/26.    Chronic Inflammatory Diseases: Have you experienced any flares in the last month? No    PATIENT SPECIFIC NEEDS     Does the patient have any physical, cognitive, or cultural barriers? No    Is the patient high risk? No    Does the patient require physician intervention or other additional services (i.e., nutrition, smoking cessation, social work)? No    Does the patient have an additional or emergency contact listed in their chart? Yes    SOCIAL DETERMINANTS OF HEALTH     At the [facility], we have learned that  life circumstances - like trouble affording food, housing, utilities, or transportation can affect the health of many of our patients.   That is why we wanted to ask: are you currently experiencing any life circumstances that are negatively impacting your health and/or quality of life? No    Social Drivers of Health     Food Insecurity: Food Insecurity Present (04/29/2023)    Hunger Vital Sign     Worried About Running Out of Food in the Last Year: Sometimes true     Ran Out of Food in the Last Year: Sometimes true   Tobacco Use: High Risk (08/18/2023)    Patient History     Smoking Tobacco Use: Every Day     Smokeless Tobacco Use: Never     Passive Exposure: Not on file   Transportation Needs: No Transportation Needs (04/29/2023)    PRAPARE - Transportation     Lack of Transportation (Medical): No     Lack of Transportation (Non-Medical): No   Alcohol Use: Not At Risk (04/29/2023)    Alcohol Use     How often do you have a drink containing alcohol?: Monthly or less     How many drinks containing alcohol do you have on a typical day when you are drinking?: 1 - 2     How often do you have 5 or more drinks on one occasion?: Never   Housing: Low Risk  (04/29/2023)    Housing     Within the past 12 months, have you ever stayed: outside, in a car, in a tent, in an overnight shelter, or temporarily in someone else's home (i.e. couch-surfing)?: No     Are you worried about losing your housing?: No   Physical Activity: Unknown (02/02/2019)    Exercise Vital Sign     Days of Exercise per Week: Patient declined     Minutes of Exercise per Session: Patient declined   Utilities: High Risk (04/29/2023)    Utilities     Within the past 12 months, have you been unable to get utilities (heat, electricity) when it was really needed?: Yes   Stress: Unknown (02/02/2019)    [employer] of Occupational Health - Occupational Stress Questionnaire     Feeling of Stress : Patient declined   Interpersonal Safety: Not At Risk (04/29/2023)    Interpersonal Safety     Unsafe Where You Currently Live: No     Physically Hurt by Anyone: No     Abused by Anyone: No   Substance Use: Medium Risk (04/29/2023)    Substance Use     In the past year, how often have you used prescription drugs for non-medical reasons?: Daily or Almost Daily     In the past year, how often have you used illegal drugs?: Once or Twice Yearly     In the past year, have you used any substance for non-medical reasons?: Yes   Intimate Partner Violence: Not At Risk (04/29/2023)    Humiliation, Afraid, Rape, and Kick questionnaire     Fear of Current or Ex-Partner: No     Emotionally Abused: No     Physically Abused: No     Sexually Abused: No   Social Connections: Unknown (10/02/2021)    Received from [facility]    Social Network     Social Network: Not on file   Financial Resource Strain: High Risk (04/29/2023)    Overall Financial Resource Strain (CARDIA)     Difficulty of  Paying Living Expenses: Hard   Depression: Not at risk (07/08/2023)    PHQ-2     PHQ-2 Score: 1   Internet Connectivity: Not on file   Health Literacy: Not on file (08/24/2020)       Would you be willing to receive help with any of the needs that you have identified today? Not applicable       SHIPPING     Specialty Medication(s) to be Shipped:   Inflammatory Disorders: Actemra  and Rasuvo     Other medication(s) to be shipped: No additional medications requested for fill at this time     Changes to insurance: No    Cost and Payment: Patient has a copay of $12.15 each. They are aware and have authorized the pharmacy to charge the credit card on file.    Delivery Scheduled: Yes, Expected medication delivery date: 4/25.     Medication will be delivered via UPS to the confirmed prescription address in [facility].    The patient will receive a drug information handout for each medication shipped and additional FDA Medication Guides as required.  Verified that patient has previously received a Conservation officer, historic buildings and a Surveyor, mining.    The patient or caregiver noted above participated in the development of this care plan and knows that they can request review of or adjustments to the care plan at any time.      All of the patient's questions and concerns have been addressed.    Sherle Dire, PharmD   [facility] Specialty Pharmacist

## 2023-09-10 ENCOUNTER — Ambulatory Visit: Admit: 2023-09-10 | Discharge: 2023-09-11

## 2023-09-10 DIAGNOSIS — F32A Depression, unspecified depression type: Principal | ICD-10-CM

## 2023-09-10 DIAGNOSIS — J449 Chronic obstructive pulmonary disease, unspecified: Principal | ICD-10-CM

## 2023-09-10 DIAGNOSIS — K449 Diaphragmatic hernia without obstruction or gangrene: Principal | ICD-10-CM

## 2023-09-10 DIAGNOSIS — D849 Immunodeficiency, unspecified: Principal | ICD-10-CM

## 2023-09-10 DIAGNOSIS — Z1211 Encounter for screening for malignant neoplasm of colon: Principal | ICD-10-CM

## 2023-09-10 DIAGNOSIS — F17200 Nicotine dependence, unspecified, uncomplicated: Principal | ICD-10-CM

## 2023-09-10 DIAGNOSIS — M5431 Sciatica, right side: Principal | ICD-10-CM

## 2023-09-10 DIAGNOSIS — R112 Nausea with vomiting, unspecified: Principal | ICD-10-CM

## 2023-09-10 DIAGNOSIS — Z87891 Personal history of nicotine dependence: Principal | ICD-10-CM

## 2023-09-10 DIAGNOSIS — K219 Gastro-esophageal reflux disease without esophagitis: Principal | ICD-10-CM

## 2023-09-10 DIAGNOSIS — M069 Rheumatoid arthritis, unspecified: Principal | ICD-10-CM

## 2023-09-10 DIAGNOSIS — G629 Polyneuropathy, unspecified: Principal | ICD-10-CM

## 2023-09-10 MED ORDER — OMEPRAZOLE 20 MG CAPSULE,DELAYED RELEASE
ORAL_CAPSULE | Freq: Every day | ORAL | 3 refills | 100.00 days | Status: CP
Start: 2023-09-10 — End: 2024-10-14

## 2023-09-10 MED ORDER — GABAPENTIN 300 MG CAPSULE
ORAL_CAPSULE | 2 refills | 0.00 days | Status: CP
Start: 2023-09-10 — End: ?

## 2023-09-10 MED ORDER — ONDANSETRON HCL 4 MG TABLET
ORAL_TABLET | Freq: Three times a day (TID) | ORAL | 1 refills | 10.00 days | Status: CP | PRN
Start: 2023-09-10 — End: 2024-09-09

## 2023-09-10 MED ORDER — TRAMADOL 50 MG TABLET
ORAL_TABLET | Freq: Three times a day (TID) | ORAL | 2 refills | 30.00 days | Status: CP | PRN
Start: 2023-09-10 — End: ?

## 2023-09-10 NOTE — Unmapped (Signed)
 [phone] colonoscopy  [phone] CT lung cancer screening

## 2023-09-10 NOTE — Unmapped (Signed)
 [facility] at [facility]     Reason for visit: Follow up    Questions / Concerns that need to be addressed: none    Screening BP- 110/70    PTHomeBP     Diabetes: n/a    Dexcom or Libre CGM in use? If so, pull appropriate reporting through portal (Dexcom) or EPIC order Bethany Meyer).    HCDM reviewed and updated in Epic:    We are working to make sure all of our patients??? wishes are updated in Epic and part of that is documenting a Environmental health practitioner for each patient  A Health Care Decision Maker is someone you choose who can make health care decisions for you if you are not able - who would you most want to do this for you????  is already up to date.    HCDM (patient stated preference): Meyer, Bethany - Daughter - [phone]    HCDM (patient stated preference): Bethany Meyer - Sister - [phone]    BPAs completed:  N/A     Annual Screenings:   Tobacco and Falls - 65+  __________________________________________________________________________________________    SCREENINGS COMPLETED IN FLOWSHEETS      AUDIT       PHQ2       PHQ9          GAD7       COPD Assessment       Falls Risk

## 2023-09-10 NOTE — Unmapped (Addendum)
 Internal Medicine Clinic    Return Visit    Face to Face    Assessment and Plan:     Diagnosis ICD-10-CM Associated Orders   1. Gastroesophageal reflux disease, unspecified whether esophagitis present  K21.9 omeprazole  (PRILOSEC) 20 MG capsule      2. Nausea and vomiting, unspecified vomiting type  R11.2 ondansetron  (ZOFRAN ) 4 MG tablet      3. Personal history of tobacco use, presenting hazards to health  Z87.891 CT Lung Cancer Screening Baseline or Annual Low Dose      4. Paraesophageal hernia  K44.9       5. Tobacco use disorder  F17.200 Ambulatory referral to Internal Medicine      6. Immunosuppressed status  D84.9 Ambulatory referral to Internal Medicine      7. Stage 1 mild COPD by GOLD classification  J44.9       8. Rheumatoid arthritis, involving unspecified site, unspecified whether rheumatoid factor present  M06.9 Ambulatory referral to Pain Clinic      9. Depression, unspecified depression type  F32.A Ambulatory referral to Internal Medicine      10. Neuropathy  G62.9 Ambulatory referral to Pain Clinic      11. Colon cancer screening  Z12.11 Colonoscopy      12. Sciatica of right side  M54.31 gabapentin  (NEURONTIN ) 300 MG capsule          Assessment & Plan  Chronic obstructive pulmonary disease (COPD)  COPD with ongoing cough. Experienced medication non-compliance due to illness but plans to resume regular use.  - Continue Symbicort  inhaler as prescribed.    Tobacco use  Reduced smoking. Plans to use nicotine  patches.  - Encourage use of nicotine  patches for smoking cessation.    Rheumatoid arthritis with severe pain  Severe pain in left hand and both feet. Previous wrist inflammation improved with prednisone . Tramadol  effective in the past.  - Renew tramadol  prescription.  - Refer to [facility] pain management program.  - follows with Rheumatology    Chronic pain due to spinal stenosis and neuropathy  Chronic pain with burning sensation in hands and feet, likely neuropathic. Pain exacerbated in the morning. History of back surgery with intermittent sharp pains. Gabapentin  may need adjustment.  - Increase gabapentin  dosage to 1200 mg TID (max dose)  - Refer to [facility] pain management program.    Gastroesophageal reflux disease (GERD)  GERD with heartburn and reflux. Symptoms may contribute to nausea.   - Initiate omeprazole  for GERD management.    Nausea and vomiting  Persistent nausea, particularly in the morning, with occasional vomiting. Possible contribution from GERD and history of cannabinoid use. Consideration of delayed gastric emptying and paraesophageal hernia as contributing factors.  - Renew Zofran  prescription for nausea.  - Initiate omeprazole  for potential GERD-related nausea.  - Consider H. pylori testing and upper endoscopy if symptoms persist.    General Health Maintenance  Due for lung and colon cancer screenings. Willing to undergo screenings.  - Order low-dose CT for lung cancer screening.  - Provide information for scheduling colonoscopy.    I have identified this patient (who is NOT eligible for CCM) as a candidate for care management. I believe they need assistance with: (Select one or more)  Management of multiple chronic conditions, Management of high-risk social environments (e.g. poor living situation), Coordination of services (e.g. appointment reminders), and Social work needs (e.g. medication affordability)    Patient is aware of the services offered, that only one practitioner can furnish CCM, that  there may be cost-sharing, and that they may withdraw at any time.         Patient Consent Status   During today's visit:   Who provided consent for Chronic Care Management? Patient  Patient consented by verbal consent       Arthritis                      Take your arthritis medications as prescribed.   Perform physical activity 3 to 5 times per week for at least 15 minutes each time.  Warm compresses and regular stretches can help with the pain.     ,       Chronic Obstructive Pulmonary Disease (COPD)                      Please get a flu shot every year.   Make sure you have received a pneumonia shot.  If you are not sure of your status please ask me or my nurse to look into it.  It is important to take your prescribed inhalers as directed.  Doing this will improve your breathing and help to keep you out of the hospital.  Let me know if you have questions about how to use your inhalers.   If you are having increased shortness of breath or cough, let me know immediately.  Avoid cigarette smoke.     ,      Chronic Pain                      Take your medications exactly as directed  Go to your counseling sessions and follow-up appointments  Get adequate amounts of sleep each night  Perform physical activity 3 to 5 times per week for at least 15 minutes each time  Try heat, cold packs, and massage  Avoid alcohol, prescription sedative medications, and illegal drugs   Pace yourself and do not overdo do it.  It is important to break large tasks into smaller ones so you don???t worsen your chronic pain.  If you think you are depressed, talk to your healthcare provider about treatment.  If you are taking pain medicine, reduce your risk of constipation by drinking plenty of water  and eating fruits and vegetables, using stool softeners when indicated by your healthcare provider.     ,       Depression/Depressive disorders                      If you ever feel like you want to hurt yourself or others: call 911 or seek medical attention and tell them it is urgent.   Take your medications as prescribed, even when you are feeling better.  See a counselor regularly if that is part of your treatment plan.  Keep a healthy diet and exercise program.   Ensure you are getting regular sleep.     ,      Tobacco Use                      Avoid the use of tobacco containing products  If you need help to quit smoking, please discuss options with your healthcare provider  Ask family, friends, and coworkers for support  Join a support group for people who are trying to quit smoking  Set a quit date  Consider signing up for a smoking cessation program.  Talk with your healthcare provider for  more details  Ask your doctor about various medications that can help you to quit smoking  Eat a healthy diet and get regular exercise  Find alternative ways of relieving your stress.     ,  If the patient meets clinical pharmacist/clinical pharmacist practitioner referral criteria, I authorize the [facility] clinical pharmacist/clinical pharmacist practitioner to adjust the medication regimen to assist the patient to safely reach their care plan goals. I understand the pharmacist will provide progress documentation to me via Epic and engage me if the patient's needs exceed the scope of the care plan.    Current Active List of Medical Conditions     Problem List             Diagnosed      Acid reflux       Gastritis       Functional Abdominal pain (Chronic)       Anxiety       Rheumatoid arthritis       Atrial flutter       Rheumatoid arthritis involving both feet       Stage 1 mild COPD by GOLD classification       Piriformis syndrome of both sides       Narrowing of spinal canal       Spondylolisthesis of lumbar region       Intestinal ulcer       Diaphragmatic hernia       Bone infection       Constipation due to slow movement through bowels       Cataract                    I spent a total of 32 min total including pre intra and post visit activities    Medication adherence and barriers to the treatment plan have been addressed. Opportunities to optimize healthy behaviors have been discussed. Patient / caregiver voiced understanding.      _____________________________________________________________________    Chief Complaint:  follow up    Patient Active Problem List    Diagnosis Date Noted    Combined forms of age-related cataract of both eyes 02/25/2022    Slow transit constipation 09/18/2019    Duodenal ulcer disease 05/08/2019    Paraesophageal hernia 05/08/2019    Spinal stenosis of lumbar region with neurogenic claudication 09/18/2018    Spondylolisthesis of lumbar region 09/18/2018    Piriformis syndrome of both sides 03/11/2018    Stage 1 mild COPD by GOLD classification 02/25/2017    Rheumatoid arthritis involving both feet     Immunosuppressed status 01/27/2016    Osteomyelitis 01/26/2016    Atrial flutter 12/25/2015    Rheumatoid arthritis 01/11/2015    Anxiety 02/10/2013    Depression 02/10/2013    Functional Abdominal pain 08/29/2012    Gastritis 08/26/2012    GERD (gastroesophageal reflux disease) 08/24/2012    Tobacco use disorder 01/01/2011       History of Present Illness  Bethany Meyer is a 68 year old female with rheumatoid arthritis and spinal stenosis who presents for follow-up.    She experiences chronic nausea, primarily in the morning, which can lead to vomiting throughout the day. Certain foods exacerbate her symptoms. She has been taking medication for nausea, which has been helpful, but she is on her last dose. A previous endoscopy revealed a small paraesophageal hernia and delayed gastric emptying.    She has persistent shocking and numbness in her hands and feet,  with the left hand and both feet being particularly affected. This discomfort is described as a burning sensation that is worse in the morning and disrupts her sleep. She is currently taking gabapentin and Tylenol for pain management, but these are not sufficiently effective. She has previously found tramadol to be helpful for pain relief.    She has a history of GERD and experiences heartburn, sometimes without eating. She uses Tums for relief but dislikes taking them. She has not been taking any other medications for reflux recently.    She has a history of COPD and is currently using two inhalers, one of which is Symbicort. She takes one puff in the morning and two at night, with the other inhaler used once daily. She reports coughing and has had issues with medication adherence due to recent illness.    She has a history of tobacco use and is attempting to reduce smoking, planning to use nicotine patches starting Saturday. She has not used marijuana in the past couple of weeks due to feeling unwell.    She has a history of back surgery and reports intermittent sharp pains that shoot from her back to her legs and feet, though this is not a daily occurrence. She uses a back brace to alleviate pressure on her back.    Current Outpatient Medications on File Prior to Visit   Medication Sig    acetaminophen (TYLENOL) 325 MG tablet Take 1 tablet (325 mg total) by mouth two (2) times a day as needed for pain.    alendronate (FOSAMAX) 70 MG tablet TAKE 1 TABLET BY MOUTH EVERY SEVEN DAYS.    arm brace Misc Boxer splint, metacarpal wrist brace    aspirin 81 MG chewable tablet CHEW 1 TABLET BY MOUTH DAILY    buPROPion (WELLBUTRIN XL) 150 MG 24 hr tablet Take 1 tablet (150 mg total) by mouth every morning.    dicyclomine (BENTYL) 20 mg tablet Take 1 tablet (20 mg total) by mouth four times a day as needed (stomach cramps).    DULoxetine (CYMBALTA) 30 MG capsule Take 1 capsule (30 mg total) by mouth daily.    empty container Misc Use as directed    folic acid (FOLVITE) 1 MG tablet Take 1 tablet (1,000 mcg total) by mouth daily.    hydrOXYzine (ATARAX) 10 MG tablet Take 1 tablet (10 mg total) by mouth every six (6) hours as needed for itching.    leg brace (KNEE SUPPORT BRACE) Misc 1 each by Miscellaneous route daily as needed.    methotrexate, PF, (RASUVO, PF,) 25 mg/0.5 mL [name] Inject the contents of 1 auto-injector (25 mg total) under the skin every seven (7) days.    needle, disp, 25 gauge 25 gauge x 5/8 Ndle For methotrexate injections    polyethylene glycol (MIRALAX) 17 gram packet Take 17 g by mouth daily.    predniSONE (DELTASONE) 10 MG tablet Take 2 tablets (20 mg total) by mouth daily for 5 days, THEN 1 tablet (10 mg total) daily for 5 days, THEN 0.5 tablets (5 mg total) daily. predniSONE (DELTASONE) 5 MG tablet 10 mg daily do not stop abruptly follow up with Rheumatology before tapering    QUEtiapine (SEROQUEL) 25 MG tablet 2 tablets before bed, increase to 3 tablets before bed if needed    senna-docusate (PERICOLACE) 8.6-50 mg Take 2 tablets by mouth nightly.    SYMBICORT 80-4.5 mcg/actuation inhaler INHALE 2 PUFFS IN THE MORNING AND IN THE EVENING    tocilizumab  162 mg/0.9 mL PnIj Inject the contents of 1 pen (162 mg) under the skin once a week.    umeclidinium (INCRUSE ELLIPTA ) 62.5 mcg/actuation inhaler Inhale 1 puff daily.    VITAMIN B-1 100 mg tablet Take 1 tablet (100 mg total) by mouth daily.     No current facility-administered medications on file prior to visit.        Review of Systems - HPI    Exam:  BP 110/70 (BP Site: L Arm, BP Position: Sitting, BP Cuff Size: Medium)  - Pulse 77  - Temp 36.4 ??C (97.6 ??F) (Temporal)  - Ht 167.6 cm (5' 6)  - Wt 57.5 kg (126 lb 12.8 oz)  - LMP 05/20/2005  - SpO2 98%  - BMI 20.47 kg/m??   Physical Exam  Thin chronically ill appearing in NAD  Wrist improved (left)  No LE edema      Bethany Hubert, PA-C  Supervising physician Evonne Hoist, MD

## 2023-09-11 DIAGNOSIS — M069 Rheumatoid arthritis, unspecified: Principal | ICD-10-CM

## 2023-09-11 MED FILL — ACTEMRA ACTPEN 162 MG/0.9 ML SUBCUTANEOUS PEN INJECTOR: SUBCUTANEOUS | 28 days supply | Qty: 3.6 | Fill #1

## 2023-09-11 MED FILL — RASUVO (PF) 25 MG/0.5 ML SUBCUTANEOUS AUTO-INJECTOR: SUBCUTANEOUS | 28 days supply | Qty: 2 | Fill #5

## 2023-09-17 NOTE — Unmapped (Signed)
 [facility] Internal Medicine   CHRONIC CARE MANAGEMENT (CCM) OUTREACH ENCOUNTER           Date of Service:  09/17/2023      Service:  Care Coordination - phone  Is there someone else in the room? No.   Are you located in [place]? Yes  MyChart use by patient is active:  inconsistent     Post-outreach Action Items:  Provider: N/A.  CM: N/A.  Patient: N/A.    Follow-up Next Call: follow up to enroll into CCM program     Chronic Care Management (CCM) Outreach  Purpose of outreach:  COPD  and chronic pain and Tobacco Abuse     Care Manager (CM) completed the following:  Reviewed chart  Pt has [employer] Medicare advantage plan   Pt lives in [place]  Pt has a Web designer   CM spoke with pt in  2/25 about issues with her utility bills  Identified following:   [employer]-  [employer]  -  The [place] [employer] program is a free evaluation of your home's energy usage. You'll receive free energy efficiency items, a custom energy usage report, low-cost tips and expert recommendations to help you lower your energy bill and add value to your home.   [phone].    Engineer, agricultural -Phone: [phone]   [employer] [facility] [place] - online request for services - Online Intake   [place] medicaid - MQB program  [place] Food Stamps Application.  [place] [phone]  Placed call to Ms. Antuna regarding enrollment in CCM.   The Internal Medicine Enhanced Care team was unable to reach Norah Nielsen to follow-up with the patient in regards to the Chronic Care Management program. We have made one attempts to contact patient. A voicemail was left with a request to return our call.     Future Appointments   Date Time Provider Department Center   09/22/2023 11:10 AM WR S ESTES 110 CT RM 1 [facility]   10/07/2023  8:45 AM Zilphia Hilt, PA [facility]   10/16/2023  9:20 AM Viviann Gross , MD [facility]   10/22/2023  8:20 AM Zilphia Hilt, PA [facility]       A copy of this Patient Outreach/CCM Encounter was sent to patient's Primary Care Provider

## 2023-09-23 DIAGNOSIS — G8929 Other chronic pain: Principal | ICD-10-CM

## 2023-09-23 DIAGNOSIS — M069 Rheumatoid arthritis, unspecified: Principal | ICD-10-CM

## 2023-09-23 DIAGNOSIS — Z789 Other specified health status: Principal | ICD-10-CM

## 2023-09-23 DIAGNOSIS — F17200 Nicotine dependence, unspecified, uncomplicated: Principal | ICD-10-CM

## 2023-09-23 NOTE — Unmapped (Signed)
 [facility] Internal Medicine   CHRONIC CARE MANAGEMENT (CCM) OUTREACH ENCOUNTER           Date of Service:  09/23/2023      Service:  Care Coordination - phone  Is there someone else in the room? No.   Are you located in [place]? No  MyChart use by patient is active: no    Post-outreach Action Items:  Provider: N/A.  CM: CM will follow up about CT scan to be rescheduled and will looks for agencies to help with electricity bill .  Patient: she will call the [employer] again for any home improvement programs. .      Embedded Care Management [facility]) Outreach  Care Manager (CM) completed the following:  Reviewed chart  Pt has [employer] Medicare advantage plan   Pt lives in [place]  Pt has a Web designer   CM spoke with pt in  2/25 about issues with her utility bills  Identified following:   [employer]-  [employer]  -  The [place] [employer] program is a free evaluation of your home's energy usage. You'll receive free energy efficiency items, a custom energy usage report, low-cost tips and expert recommendations to help you lower your energy bill and add value to your home.   [phone].    Engineer, agricultural -Phone: [phone]   [employer] [facility] [place] - online request for services - Online Intake   [place] medicaid - MQB program  [place] Food Stamps Application.  [place] [phone]  Placed call to Ms. Locurto regarding enrollment in ECM.   Patient agreed, aware of monthly outreach and will call me if needs arise.     Discussed ECM enrollment questions below  Provided patient with copy of care plan and welcome letter, via mail{Initial Assessment    General Assessment:  How would you rate your overall health right now?  (Scale of 1-5) 3- Good  What is your primary health concern?  Left hand and feet burning , I can feel something run all the way down my body to my feet, they stay numb most of time, can't feel when I have on socks sometimes .   Health Literacy: How confident are you that you understand your health issues/concerns, can participate in your care, and manage your care along with your physician (Scale of 1-3): 3- Confident    Provider Assessment:  Who is your Primary Care Provider? Eulas Hick  What things would cause you to go to the ED rather than address at your PCP? Vomiting when its very bad, I can't function sometimes .  Do you see any other providers outside of [facility] system? NO   Tell me about your last medical appointment/hospital stay/ED visit Do you have any questions? no  Any recommendation that may be difficult to follow? She talked about some tests she wants me to get , to have a picture of my lungs and I was supposed to do this yesterday and missed my appt .  Pt took some medicine to help her sleep and overslept. CM to help pt rescheduled   this appt.     Home Assessment:  Do you worry about paying for your medications, bills or food? Medications, Bills, and Food  When was the last time you used a tobacco product such as cigarettes, cigars, cigarillos, smokeless tobacco, vape or e-cigarettes? today  Tobacco Product: Marlboro Cigarettes   Amount: maybe a pack  a day - dont smoke them all fully,  12 cigarettes a day . Have you fallen recently or are you concerned about falling?  No worries, sometimes I lose my balance sometimes but its due to sitting the wrong way when I sit sometimes .  Are you able to perform ADLs? YES  How do you travel to medical appointments/pharmacy? Usually I drive myself unless my hand is acting up. The prednisone has helped.   Who is available to help you if you need anything/Who is your support system? Dtr Tori_  and 4 grandchildren age between 19-25 around 10 miles away and brings her care packages   Have you designated anyone to make medical decisions for you if you were unable to? I think I have Tori down for this   Do you use any home health services or any other community resources? no  Do you use any medical supplies or equipment? NO  Medications & Allergies reviewed today: YES  Do you have any questions or concerns about your medications? NO  Medication response/ notes: N/A.  Reviewed medication list yes  Any changes or discrepancies with medication list?  Refills needed: No/ none.    Social Determinants of health reviewed and updated in Epic? YES   Pt has an extremely high electric bill monthly and this started up 3 years ago. Current bill is $1500- pt tried a [employer] program but they wanted her to sign over a part of her land to cover the costs and pt refused. She lives on land ( 1/5 acre) that she inherited from her father , her sister lives next door on family land also.   Pt applied for food stamps and was awarded only 26$   The high cost of her electricity is making overall her ability to afford her other bills very difficult  Pt currently has all her medication.  Pt has not heard back from Legal Aide , we will try this again  Pt did not reach anyone at the [facility] office when she tried, we will try to call again  Pt has not applied for medicaid as she may qualify for MQB which will help her financially (but not solve the over all issue)     Created, reviewed, or updated care plan? Yes.    Yes      Wrap-up  Reviewed upcoming clinic appointment(s): Yes.  Plans to keep appointment(s): Yes.  Transportation assistance needed: .no  Patient was encouraged to reach out to their provider with any questions or concerns  A copy of this Patient Outreach/CCM Encounter was sent to patient's Primary Care Provider     CCM Documentation  Time spent in direct care with patient and/or health care proxy via non-in-person encounter(s): 38  Time spent in indirect patient care and coordination: 30    Future Appointments   Date Time Provider Department Center   10/07/2023  8:45 AM Alayne Hubert [place], PA [facility]   10/16/2023  9:20 AM Christina Coyer [place] , MD [facility]   10/22/2023  8:20 AM Zilphia Hilt, PA [facility]       A copy of this Patient Outreach Encounter was sent to patient's Primary Care Provider

## 2023-10-03 DIAGNOSIS — M069 Rheumatoid arthritis, unspecified: Principal | ICD-10-CM

## 2023-10-03 MED ORDER — RASUVO (PF) 25 MG/0.5 ML SUBCUTANEOUS AUTO-INJECTOR
SUBCUTANEOUS | 5 refills | 28.00000 days
Start: 2023-10-03 — End: ?

## 2023-10-03 NOTE — Unmapped (Signed)
 [facility] Refill Coordination Note    Specialty Medication(s) to be Shipped:   Inflammatory Disorders: Actemra    Other medication(s) to be shipped: Methotrexate     Bethany Meyer, DOB: 18-May-1956  Phone: There are no phone numbers on file.      All above HIPAA information was verified with patient.     Was a Nurse, learning disability used for this call? No    Completed refill call assessment today to schedule patient's medication shipment from the [facility]  [phone]).  All relevant notes have been reviewed.     Specialty medication(s) and dose(s) confirmed: Regimen is correct and unchanged.   Changes to medications: [name] reports no changes at this time.  Changes to insurance: No  New side effects reported not previously addressed with a pharmacist or physician: Yes - Patient reports numbness. Patient would like to speak to the pharmacist today. Their provider is not aware.  Questions for the pharmacist: Yes: about the numbness    Confirmed patient received a Conservation officer, historic buildings and a Surveyor, mining with first shipment. The patient will receive a drug information handout for each medication shipped and additional FDA Medication Guides as required.       DISEASE/MEDICATION-SPECIFIC INFORMATION        For patients on injectable medications: Patient currently has 1 doses left.  Next injection is scheduled for 5.16.2025.    SPECIALTY MEDICATION ADHERENCE     Medication Adherence    Patient reported X missed doses in the last month: 0  Specialty Medication: tocilizumab: ACTEMRA ACTPEN 162 mg/0.9 mL Pnij  Patient is on additional specialty medications: No              Were doses missed due to medication being on hold? No      tocilizumab: ACTEMRA ACTPEN 162 mg/0.9 mL Pnij: 1 doses of medicine on hand       REFERRAL TO PHARMACIST     Referral to the pharmacist: Yes - patient request      SHIPPING     Shipping address confirmed in Epic.     Cost and Payment: Patient has a $0 copay, payment information is not required.    Delivery Scheduled: Yes, Expected medication delivery date: 5.22.2025.     Medication will be delivered via UPS to the prescription address in Epic WAM.    [name]   [facility]  Specialty Technician

## 2023-10-03 NOTE — Unmapped (Signed)
 Methotrexate refill  Last Visit Date: 04/09/2023  Next Visit Date: 10/16/2023    Lab Results   Component Value Date    ALT 9 (L) 08/18/2023    AST 16 08/18/2023    ALBUMIN 2.9 (L) 08/18/2023    CREATININE 1.00 08/18/2023     Lab Results   Component Value Date    WBC 10.7 08/18/2023    HGB 12.0 08/18/2023    HCT 35.3 08/18/2023    PLT 688 (H) 08/18/2023     Lab Results   Component Value Date    NEUTROPCT 70.1 08/18/2023    LYMPHOPCT 19.5 08/18/2023    MONOPCT 7.9 08/18/2023    EOSPCT 1.3 08/18/2023    BASOPCT 1.2 08/18/2023

## 2023-10-03 NOTE — Unmapped (Signed)
 [facility] - Clinical Pharmacist Intervention   Reason for Intervention Issue Observed Disease state management          Additional Comments     Intervention Type of Intervention Education performed                  Recommendation provided, if applicable      Medication Medication(s) Involved Actemra   Outcome Expected Result/Outcome of Intervention Enhanced patient understanding    Additional Comments Patient has numbness at baseline due to RA.  She used to be able to take tramadol to help with the pain in the past but lately the pain has been bad.  Patient has an appt on 5/20 with primary care to review this.  Advised patient that this does not seem to be related to Actemra side effect but possible her disease.  Patient agreed and will disucss with MD    Follow-up Needed? No    Additional Follow-up Comments     Supporting Information Approximate Time Spent on Intervention      Clinical Evidence Used Clinical guidelines or Drug information resource

## 2023-10-06 MED ORDER — RASUVO (PF) 25 MG/0.5 ML SUBCUTANEOUS AUTO-INJECTOR
SUBCUTANEOUS | 5 refills | 28.00000 days | Status: CP
Start: 2023-10-06 — End: ?

## 2023-10-07 ENCOUNTER — Ambulatory Visit: Admit: 2023-10-07 | Payer: Medicare (Managed Care)

## 2023-10-08 MED FILL — ACTEMRA ACTPEN 162 MG/0.9 ML SUBCUTANEOUS PEN INJECTOR: SUBCUTANEOUS | 28 days supply | Qty: 3.6 | Fill #2

## 2023-10-10 ENCOUNTER — Ambulatory Visit: Admit: 2023-10-10 | Discharge: 2023-10-10 | Payer: Medicare (Managed Care)

## 2023-10-10 DIAGNOSIS — R202 Paresthesia of skin: Principal | ICD-10-CM

## 2023-10-10 DIAGNOSIS — R200 Anesthesia of skin: Principal | ICD-10-CM

## 2023-10-10 DIAGNOSIS — R7989 Other specified abnormal findings of blood chemistry: Principal | ICD-10-CM

## 2023-10-10 DIAGNOSIS — M069 Rheumatoid arthritis, unspecified: Principal | ICD-10-CM

## 2023-10-10 DIAGNOSIS — K5900 Constipation, unspecified: Principal | ICD-10-CM

## 2023-10-10 DIAGNOSIS — R110 Nausea: Principal | ICD-10-CM

## 2023-10-10 LAB — FOLATE: FOLATE: >24 ng/mL (ref >=5.4)

## 2023-10-10 LAB — VITAMIN B12: VITAMIN B-12: 898 pg/mL (ref 211–911)

## 2023-10-10 LAB — T4, FREE: FREE T4: 1.14 ng/dL (ref 0.89–1.76)

## 2023-10-10 LAB — TSH: THYROID STIMULATING HORMONE: 0.434 u[IU]/mL — ABNORMAL LOW (ref 0.550–4.780)

## 2023-10-10 MED ORDER — PREDNISONE 5 MG TABLET
ORAL_TABLET | ORAL | 0 refills | 0.00000 days | Status: CP
Start: 2023-10-10 — End: ?

## 2023-10-10 MED ORDER — POLYETHYLENE GLYCOL 3350 17 GRAM/DOSE ORAL POWDER
Freq: Every day | ORAL | 2 refills | 100.00000 days | Status: CP
Start: 2023-10-10 — End: ?

## 2023-10-10 NOTE — Unmapped (Signed)
 [facility]     Reason for visit: Follow    Questions / Concerns that need to be addressed: Pain abdomen, hands and feet ongoing  Wants a rx for prednisone        Diabetes:  Regularly checking blood sugars?: no  If yes, when? Complete log for past 7 days  Date Before Breakfast After Breakfast Before Lunch After Lunch Before Dinner After Dinner Before Bed                                                                                                                                     Hypertension:  Have blood pressure cuff at home?: no  Regularly checking blood pressure?: no  If patient has a  record of recent blood pressures, please enter into flowsheets using this link     PTHomeBP                Allergies reviewed: Yes    Medication reviewed: Yes  Pended refills? No        HCDM reviewed and updated in Epic:    We are working to make sure all of our patients??? wishes are updated in Epic and part of that is documenting a Environmental health practitioner for each patient  A Health Care Decision Maker is someone you choose who can make health care decisions for you if you are not able - who would you most want to do this for you????  is already up to date.        BPAs completed:  PHQ2  PHQ9  GAD7  AUDIT - Alcohol Screen  HARK - Interpersonal Violence      COVID-19 Vaccine Summary  Which COVID-19 Vaccine was administered  Moderna  Type:  Dates Given:  02/27/2023                   If no: Are you interested in scheduling? Wants vaccine - eligible now    Immunization History   Administered Date(s) Administered    COVID-19 VAC,BIVALENT(66YR UP),PFIZER 05/03/2021, 11/01/2021    COVID-19 VACCINE,MRNA(MODERNA)(PF) 06/29/2019, 07/27/2019, 04/07/2020    Covid-19 Vac, (37yr+) (Comirnaty) Mrna Pfizer  02/27/2023    Covid-19 Vac, (10yr+) (Spikevax) Monovalent Moderna 05/03/2022    DTP 12/22/1962, 02/04/1963, 07/22/1963    HEPATITIS B VACCINE ADULT,IM(ENERGIX B, RECOMBIVAX) 01/03/2015, 03/13/2015 Hepatitis B Vaccine, Unspecified Formulation 08/19/1996, 02/24/1997    INFLUENZA IIV3 HIGH DOSE 69YRS+(FLUZONE) 02/27/2023    INFLUENZA QUAD HIGH DOSE 69YRS+(FLUZONE) 03/03/2019    Influenza Vaccine Quad(IM)6 MO-Adult(PF) 02/10/2013, 05/03/2014, 03/13/2015, 03/20/2016, 07/12/2017, 03/11/2018, 02/14/2020, 05/03/2021, 02/01/2022    Influenza Virus Vaccine, unspecified formulation 02/10/2013, 05/03/2014, 03/13/2015, 03/20/2016, 07/12/2017, 03/11/2018    MMR 12/07/1998, 12/20/1998, 04/04/1999    Meningococcal Conjugate MCV4P 05/03/2021    OPV 07/19/1962, 09/13/1962, 12/22/1962    PNEUMOCOCCAL POLYSACCHARIDE 23-VALENT 01/11/2015    Pneumococcal Conjugate 20-valent 11/01/2021    Pneumococcal conjugate -  PCV7 02/18/2012    TD(TDVAX),ADSORBED,2LF(IM)(PF) 12/07/1998    TdaP 01/01/2011       __________________________________________________________________________________________    SCREENINGS COMPLETED IN FLOWSHEETS    HARK Screening       AUDIT       PHQ2       PHQ9          Link for Multi-language PHQ/GAD screeners: [email or web address].com/[email or web address]    P4 Suicidality Screener                GAD7       COPD Assessment       Falls Risk       .imcres

## 2023-10-10 NOTE — Unmapped (Signed)
 VISIT SUMMARY:    During your visit, we discussed your ongoing issues with chronic nausea, rheumatoid arthritis, neuropathy, constipation, and GERD. We reviewed your current medications and symptoms, and we have made some adjustments to your treatment plan to help manage your conditions more effectively.    YOUR PLAN:    -RHEUMATOID ARTHRITIS: Rheumatoid arthritis is an autoimmune disease that causes joint inflammation and pain. We will restart low-dose prednisone to help manage your symptoms, and you should continue with your methotrexate and Actemra injections. We will coordinate with your rheumatologist for ongoing management. Please see them next week.    -NEUROPATHY: Neuropathy is a condition that results in tingling, numbness, and weakness, often in the hands and feet. We will order a cervical spine X-ray to check for any disc issues and perform blood tests to check for vitamin deficiencies that might be contributing to your symptoms.    -CONSTIPATION: Constipation is a condition where you have infrequent or difficult bowel movements. Since Senokot has not been effective, we will start you on MiraLAX daily to help improve your bowel movements.    -CHRONIC NAUSEA: Chronic nausea can be caused by various factors, including GERD, slow gastric motility, or constipation. We will consult with a gastroenterologist to evaluate your slow gastric motility and consider other medication options. Improving your constipation may also help alleviate your nausea.    -GERD: GERD (Gastroesophageal Reflux Disease) is a condition where stomach acid frequently flows back into the esophagus, causing irritation. You should continue taking omeprazole for GERD management. We will consult with a gastroenterologist for further evaluation and management of your GERD and related symptoms.    INSTRUCTIONS:    Please follow up with the gastroenterologist as discussed for further evaluation of your chronic nausea and GERD. Additionally, get the cervical spine X-ray and blood tests for vitamin deficiencies as ordered. Continue with your current medications and start taking MiraLAX daily for constipation. We will coordinate with your rheumatologist for the ongoing management of your rheumatoid arthritis.

## 2023-10-10 NOTE — Unmapped (Signed)
 Internal Medicine Clinic    Return Visit    Face to Face    Assessment and Plan:     Diagnosis ICD-10-CM Associated Orders   1. Rheumatoid arthritis, involving unspecified site, unspecified whether rheumatoid factor present    M06.9 predniSONE (DELTASONE) 5 MG tablet      2. Numbness and tingling in both hands  R20.0 XR Cervical Spine AP And Lateral    R20.2 Vitamin B12 Level     Folate Level     Vitamin B6     TSH      3. Constipation, unspecified constipation type  K59.00       4. Nausea  R11.0           Assessment & Plan  Rheumatoid arthritis  Rheumatoid arthritis with hand and foot pain. Improvement with prednisone and tramadol. On methotrexate and Actemra. Numbness and grip difficulty suggest nerve compression or neuropathy. Coordinated with rheumatologist for management.  - Restart low-dose prednisone.  - Continue methotrexate and Actemra injections.  - Coordinate with rheumatologist for ongoing management.    Neuropathy  Neuropathy with hand tingling and numbness, possibly due to cervical spine issues or vitamin deficiencies. Strength intact  - Order cervical spine X-ray to evaluate for disc issues.  - Perform blood tests for vitamin deficiencies (e.g., folate, B12)  - already on max dose gabapentin    Constipation  Chronic constipation with infrequent bowel movements. Senokot ineffective.   - Initiate MiraLAX daily     Chronic nausea, occasional vomiting and some discomort.  Discomfort better with movement.  Chronic nausea possibly due to GERD, slow gastric motility, or constipation, also has paraesophageal hernia. Previous endoscopy showed paraesophageal hernia and delayed gastric emptying. Omeprazole ineffective. GI consultation discussed.  May need transvaginal ultrasound or CT but have had difficulty connecting her with scheduled studies and specialty visits.  - Consult with GI to evaluate slow gastric motility and consider medication options.  - Focus on improving constipation, may help nausea.    GERD  GERD with chronic nausea and possible delayed gastric emptying. Previous endoscopy indicated paraesophageal hernia. Omeprazole ineffective. Further GI evaluation discussed.  - Continue omeprazole for GERD management.  - Consult with GI for further evaluation and management of GERD and related symptoms.    Chronic pain  Spinal stenosis, neuropathy and RA  Pain clinic referral placed last visit but not processed, messaged admin team today  I spent a total of 42 min total including pre intra and post visit activities    Medication adherence and barriers to the treatment plan have been addressed. Opportunities to optimize healthy behaviors have been discussed. Patient / caregiver voiced understanding.      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    Paraesophageal hernia 05/08/2019    Spinal stenosis of lumbar region with neurogenic claudication 09/18/2018    Spondylolisthesis of lumbar region 09/18/2018    Piriformis syndrome of both sides 03/11/2018    Stage 1 mild COPD by GOLD classification   02/25/2017    Rheumatoid arthritis involving both feet       Immunosuppressed status (HHS-HCC) 01/27/2016    Osteomyelitis   01/26/2016    Atrial flutter   12/25/2015  Rheumatoid arthritis   01/11/2015    Anxiety 02/10/2013    Depression 02/10/2013    Functional Abdominal pain 08/29/2012    Gastritis 08/26/2012    GERD (gastroesophageal reflux disease) 08/24/2012    Tobacco use disorder 01/01/2011       History of Present Illness  Bethany Meyer is a 68 year old female with GERD who presents with chronic nausea.    She continues to experience significant nausea despite the initiation of omeprazole one month ago. She describes a sensation of food moving slowly from her throat to her stomach, accompanied by indigestion. Her long-term nausea and vomiting interfere with her nutrition and daily activities. She recalls a previous endoscopy about four years ago.    She has a history of rheumatoid arthritis and is currently on Actemra and methotrexate. Her Actemra shot was administered last Friday, and she has been consistent with her methotrexate use. Her hands have not been swelling recently, but she experiences numbness and weakness, particularly in her right hand, leading to difficulty holding objects.    She experiences significant back pain, which she attributes to her stomach issues. The pain radiates to her buttocks and legs. She is also constipated, with her last bowel movement being a small one on Tuesday. She has been taking Senokot daily without relief and reports not eating much due to her symptoms.    She has a history of neuropathy and insomnia, and she is currently taking gabapentin 1200 mg TID. The gabapentin has helped somewhat with her symptoms, but she continues to experience tingling and numbness in her hands, which affects her sleep and daily activities.    No blood in stool and no bloating in the lower abdomen. She has not had a hysterectomy and has no history of surgeries in the pelvic region. She describes her abdominal pain as feeling like 'having a young'un' and notes that movement helps alleviate the discomfort.  No bloating no vaginal bleeding.    Current Outpatient Medications on File Prior to Visit   Medication Sig    acetaminophen (TYLENOL) 325 MG tablet Take 1 tablet (325 mg total) by mouth two (2) times a day as needed for pain.    alendronate (FOSAMAX) 70 MG tablet TAKE 1 TABLET BY MOUTH EVERY SEVEN DAYS.    aspirin 81 MG chewable tablet CHEW 1 TABLET BY MOUTH DAILY    buPROPion (WELLBUTRIN XL) 150 MG 24 hr tablet Take 1 tablet (150 mg total) by mouth every morning.    dicyclomine (BENTYL) 20 mg tablet Take 1 tablet (20 mg total) by mouth four times a day as needed (stomach cramps).    DULoxetine (CYMBALTA) 30 MG capsule Take 1 capsule (30 mg total) by mouth daily.    empty container Misc Use as directed    folic acid (FOLVITE) 1 MG tablet Take 1 tablet (1,000 mcg total) by mouth daily.    gabapentin (NEURONTIN) 300 MG capsule 4 capsules three times daily    hydrOXYzine (ATARAX) 10 MG tablet Take 1 tablet (10 mg total) by mouth every six (6) hours as needed for itching.    methotrexate, PF, (RASUVO, PF,) 25 mg/0.5 mL [name] Inject the contents of 1 auto-injector (25 mg total) under the skin every seven (7) days.    omeprazole (PRILOSEC) 20 MG capsule Take 1 capsule (20 mg total) by mouth daily.    ondansetron (ZOFRAN) 4 MG tablet Take 1 tablet (4 mg total) by mouth every eight (8) hours as needed for nausea.  QUEtiapine (SEROQUEL) 25 MG tablet 2 tablets before bed, increase to 3 tablets before bed if needed    senna-docusate (PERICOLACE) 8.6-50 mg Take 2 tablets by mouth nightly.    SYMBICORT 80-4.5 mcg/actuation inhaler INHALE 2 PUFFS IN THE MORNING AND IN THE EVENING    tocilizumab 162 mg/0.9 mL PnIj Inject the contents of 1 pen (162 mg) under the skin once a week.    traMADol (ULTRAM) 50 mg tablet Take 1 tablet (50 mg total) by mouth every eight (8) hours as needed for pain.    umeclidinium (INCRUSE ELLIPTA) 62.5 mcg/actuation inhaler Inhale 1 puff daily.    VITAMIN B-1 100 mg tablet Take 1 tablet (100 mg total) by mouth daily.    arm brace Misc Boxer splint, metacarpal wrist brace    leg brace (KNEE SUPPORT BRACE) Misc 1 each by Miscellaneous route daily as needed.    needle, disp, 25 gauge 25 gauge x 5/8 Ndle For methotrexate injections     No current facility-administered medications on file prior to visit.        Review of Systems - Negative except hpi     Exam:  BP 110/77 (BP Site: L Arm, BP Position: Sitting, BP Cuff Size: Medium)  - Pulse 83  - Temp 36.6 ??C (97.8 ??F) (Temporal)  - Resp 18  - Ht 167.6 cm (5' 6)  - Wt 56.7 kg (125 lb)  - LMP 05/20/2005  - SpO2 96%  - BMI 20.18 kg/m??   Physical Exam  NECK: Tenderness and pain on extension.  ABDOMEN: Lower abdomen pain and tenderness.    Bethany Hubert, PA-C  Supervising physician Evonne Hoist, MD

## 2023-10-13 DIAGNOSIS — K219 Gastro-esophageal reflux disease without esophagitis: Principal | ICD-10-CM

## 2023-10-13 DIAGNOSIS — R110 Nausea: Principal | ICD-10-CM

## 2023-10-13 DIAGNOSIS — K5900 Constipation, unspecified: Principal | ICD-10-CM

## 2023-10-13 NOTE — Unmapped (Signed)
 [facility] Ambulatory E-Consult Note         Thank you for your Ambulatory E-Consult.     To summarize: 66F with a h/o RA on immunotherapy, GERD in s/o paraesophageal hernia, chronic nausea w/ occasional vomiting, constipation, tobacco abuse with continued symptoms of nausea, abdominal discomfort, and constipation. Has been started on Miralax and is on PPI and Zofran. Noted to smoke MJ, but reportedly minimal use. No weight loss. Noted to have difficulty connecting with specialty clinics and procedures due to cost and transportation barriers. Labs 07/2023 notable for CBC with thrombocytosis, CMP with albumin 2.9, ESR 115. 07/2023 AXR with mild stool burden. EGD in 2021 notable for nml eso, 5 cm HH, bilious gastric fluid suggestive of possible delayed gastric emptying, gastric erythema/ congestion, and erythematous duodenopathy (no HP). Colonoscopy 2014 unremarkable. E-Consult with specific question of if okay to try a promotility agent in addition to continuing to work on bowel regimen, while working with patient on consideration for a repeat EGD.     My recommendations are as follows:   - Consider gastric emptying study as recommended at time of prior endoscopy for formal evaluation for possible gastroparesis  - Agree with focus on a bowel regimen to adequately manage constipation (degree of symptoms and/or response to Miralax unclear based on e consult)  - Ensure PPI is optimized from a standpoint of timing (30-60 minutes before breakfast) and can consider increased dose and/or BID dosing if concern for evening symptoms of GERD  - Reasonable to consider a prokinetic trial (metoclopramide 5-10 mg TID for 4 weeks). Would check EKG for Qtc prior and counsel on side effects/ indications for discontinuation.   - Patient also overdue for colonoscopy for screening and do note this has already been ordered.  - If patient were to develop alarm symptoms would be reasonable to consider EGD and/or cross sectional imaging.    I spent 16-20 minutes in medical consultative verbal and/or internet discussion and review of medical records, including a written report to the treating provider via electronic health record regarding the condition of this patient.         This Ambulatory E-Consult did include an answerable clinical question and did not recommend a clinic visit.    Sarajane Cumming, MD      This Ambulatory E-Consult is based solely on the clinical information available to me in the patient's medical record and is provided without benefit of a comprehensive evaluation or physical examination of the patient. The information contained in this E-Consult must be interpreted in light of any clinical issues or changes in patient status that were not known to me at the time the E-Consult was completed. You must rely on your own informed clinical judgement for decision making. If necessary, refer the  patient for an in-office consultation. Please contact me if you have further questions.

## 2023-10-16 LAB — VITAMIN B6: VITAMIN B6: 7 ug/L

## 2023-10-21 DIAGNOSIS — M069 Rheumatoid arthritis, unspecified: Principal | ICD-10-CM

## 2023-10-21 DIAGNOSIS — F17200 Nicotine dependence, unspecified, uncomplicated: Principal | ICD-10-CM

## 2023-10-21 MED ORDER — NALOXONE 4 MG/ACTUATION NASAL SPRAY
0 refills | 0.00000 days
Start: 2023-10-21 — End: ?

## 2023-10-21 NOTE — Unmapped (Signed)
 [facility]   CHRONIC CARE MANAGEMENT OUTREACH ENCOUNTER           Date of Service:  10/21/2023      Service:  Care Coordination -     Post-outreach Action Items:  Provider: N/A.  CM: will call [facility] next week and talk specifically about options for pt for her home .  Patient: N/A.        Chronic Care Management (CCM) Outreach  Purpose of outreach:  Arthritis/Osteoarthritis and tobacco use and chronic pain    Care Manager (CM) completed the following:  Reviewed chart  Identified Resources:   [place] [facility]idential [facility] Program with [facility] - [phone]  --Residential Rehabilitation Program - [place], [place] - Official Website   [phone]- Home Rehabilitation - [facility], [place]     CM researching options for pt to help with likely need for new HVAC system.   Phone call to [facility] program and spoke with Ms Juluis Ok-  We discussed address for pt and unfortunately pt does not live within city limits , their application season is closed till fall also.   She feels pt may qualify for the home funds program with [facility] (westernization program)   CM voiced pt has tried westernization program in the past and was told she would have to sell some of her land so she declined program. Ms Juluis Ok voices that is not a Financial risk analyst of the [employer] program but with her program (which pt does not qualify for due to location in county she lives) they put a Corporate investment banker on your home until loan is paid off (however loan is forgivable per 5K a year )   Ms Juluis Ok suggests there may have been some misunderstanding with pt and the westernization program. - the lady that is over that program shares an office with her and is on vacation till next week .   Plan made to call next week ( on  Friday and talk to Jeannie Felts about options for pt )   Ms Juluis Ok can be reached at [email or web address]  The Internal Medicine Enhanced Care team was unable to reach Bethany Meyer to follow-up with the patient in regards to the Chronic Care Management program. We have made one attempts to contact patient. No voicemail was available to leave message.     Noted pt has appt cancelled tomorrow as provider needed to change around appts. CM attempted to call pt x4 without success               A copy of this Patient Outreach/CCM Encounter was sent to patient's Primary Care Provider     CCM Documentation  Time spent in direct care with patient and/or health care proxy via non-in-person encounter(s): 0  Time spent in indirect patient care and coordination: 25

## 2023-10-21 NOTE — Unmapped (Unsigned)
 [facility] Follow-up      Bethany Meyer    Opioid pain medication agreement last signed: 0716/20 (needs pain agreement signed)    Most recent Naloxone [facility]) nasal spray Rx sent: N/A (order pended)    Last PDMP Review: 09/10/2023  8:31 AM    Last Opioid Dispensed Provider: Zilphia Hilt, PA    Recent Visits  Date Type Provider Dept   10/10/23 Office Visit Zilphia Hilt, [place] [facility]       Last Drug Screen Date: 07/08/2023  Urine Toxicology Screen    Lab Results   Component Value Date    AMPHU Negative 07/08/2023    BARBU Negative 07/08/2023    BENZU Negative 07/08/2023    CANNAU Positive (A) 07/08/2023    METHU Negative 07/08/2023    COCAU Negative 07/08/2023    OPIAU Negative 07/08/2023    FENTU Negative 08/22/2020    OXYCOU Negative 07/08/2023    BUPRENORPHIN Negative 07/08/2023        Last Drug Screen Date: 07/11/2023  Opiate Confirmation Test    6-Monoacetylmorphine   Date Value Ref Range Status   07/08/2023 <5 <5 ng/mL Final     Morphine   Date Value Ref Range Status   07/08/2023 <25 <25 ng/mL Final     Codeine   Date Value Ref Range Status   07/08/2023 <25 <25 ng/mL Final     Hydrocodone   Date Value Ref Range Status   07/08/2023 <25 <25 ng/mL Final     Hydromorphone   Date Value Ref Range Status   07/08/2023 <25 <25 ng/mL Final     Oxycodone   Date Value Ref Range Status   07/08/2023 <25 <25 ng/mL Final     Oxymorphone   Date Value Ref Range Status   07/08/2023 <25 <25 ng/mL Final     Buprenorphine   Date Value Ref Range Status   07/08/2023 <5 <5 ng/mL Final     Norbuprenorphine   Date Value Ref Range Status   07/08/2023 <5 <5 ng/mL Final     Fentanyl   Date Value Ref Range Status   07/08/2023 <0.5 <0.5 ng/mL Final     Norfentanyl   Date Value Ref Range Status   07/08/2023 <1.0 <1.0 ng/mL Final       Future Appointments  Date Type Provider Dept         11/18/23 Appointment Zilphia Hilt, PA [place] [facility] Showing future appointments within next 365 days and meeting all other requirements

## 2023-10-22 ENCOUNTER — Ambulatory Visit: Admit: 2023-10-22 | Payer: Medicare (Managed Care)

## 2023-10-22 NOTE — Unmapped (Signed)
I was the supervising physician in the delivery of the service. Glen Blatchley A Kenasia Scheller, PA

## 2023-10-31 DIAGNOSIS — F17200 Nicotine dependence, unspecified, uncomplicated: Principal | ICD-10-CM

## 2023-10-31 NOTE — Unmapped (Signed)
 [facility] Internal Medicine   CHRONIC CARE MANAGEMENT OUTREACH ENCOUNTER           Date of Service:  10/31/2023      Service:  Care Coordination - phone  Is there someone else in the room? No.   Are you located in [place]? Yes  MyChart use by patient is active: inconsistent    Post-outreach Action Items:  Provider: N/A.  CM: N/A.  Patient: N/A.        Chronic Care Management (CCM) Outreach  Purpose of outreach:  Arthritis/Osteoarthritis and chronic pain   Care Manager (CM) completed the following:  Reviewed chart  5/23 PCP appt-  She continues to experience significant nausea despite the initiation of omeprazole one month ago. She describes a sensation of food moving slowly from her throat to her stomach, accompanied by indigestion. Her long-term nausea and vomiting interfere with her nutrition and daily activities. She recalls a previous endoscopy about four years ago.  She has a history of neuropathy and insomnia, and she is currently taking gabapentin 1200 mg TID. The gabapentin has helped somewhat with her symptoms, but she continues to experience tingling and numbness in her hands, which affects her sleep and daily activities.    Econsult to [employer]  recommendations are as follows:   - Consider gastric emptying study as recommended at time of prior endoscopy for formal evaluation for possible gastroparesis  - Agree with focus on a bowel regimen to adequately manage constipation (degree of symptoms and/or response to Miralax unclear based on e consult)  - Ensure PPI is optimized from a standpoint of timing (30-60 minutes before breakfast) and can consider increased dose and/or BID dosing if concern for evening symptoms of GERD  - Reasonable to consider a prokinetic trial (metoclopramide 5-10 mg TID for 4 weeks). Would check EKG for Qtc prior and counsel on side effects/ indications for discontinuation.   - Patient also overdue for colonoscopy for screening and do note this has already been ordered.  - If patient were to develop alarm symptoms would be reasonable to consider EGD and/or cross sectional imaging.  Identified Resources:   Engineer, agricultural -Phone: [phone]   [place] [facility]idential Re[facility]itation Program with [employer] [phone]  --Residential Rehabilitation Program - [place], [place] - Official Website   [employer] [facility] [phone]- Home Rehabilitation - [facility] [employer], [place]       Call placed to Ms. Retter re: The Internal Medicine Enhanced Care team was unable to reach Hertha Coxe to follow-up with the patient in regards to the Chronic Care Management program. We have made one attempts to contact patient. A voicemail was left with a request to return our call.     Planned to discuss:plan to call [employer] with pt to talk to [facility] on options.     Phone call to [employer] and inquired about options for resources to help with heating/air system. Was advised to call the prosthetic program with the [facility] - [phone] ext [phone]  Attempted to call this number and no answer and was sent back to operator who advised that extension has not worked in many years. Gave CM the extension [id number] or [id number]  Phone call to Prosthetic dept and was advised they is different grants Counselling psychologist [place] or the [employer] ) pt does not appear to qualify for either as criteria includes loss of  lower extremities, blindness ,burns or loss of arms . Cm was advised to try the [place] SW Dept but they were not able to give this writer the contact number       Future Appointments   Date Time Provider Department Center   11/06/2023  3:10 PM Rod Batter [place], [place] [facility]   01/15/2024  3:00 PM Stacia Ozell [place] , MD [facility]       A copy of this Patient Outreach/CCM Encounter was sent to patient's Primary Care Provider     CCM Documentation  Time spent in direct care with patient and/or health care proxy via non-in-person encounter(s): 3  Time spent in indirect patient care and coordination: 35

## 2023-11-05 NOTE — Unmapped (Signed)
 The [facility] has made a second and final attempt to reach this patient to refill the following medication:tocilizumab: ACTEMRA ACTPEN 162 mg/0.9 mL Pnij.      We have left voicemails on the following phone numbers: [phone] and have sent a text message to the following phone numbers: [phone].    Dates contacted: 6.12.25 and 6.18.25  Last scheduled delivery: 5.22.25    The patient may be at risk of non-compliance with this medication. The patient should call the [facility] at [phone]  Option 4, then Option 2: Dermatology, Gastroenterology, Rheumatology to refill medication.    Bethany Meyer   [facility] and [facility]

## 2023-11-06 ENCOUNTER — Inpatient Hospital Stay: Admit: 2023-11-06 | Discharge: 2023-11-07 | Payer: Medicare (Managed Care)

## 2023-11-06 DIAGNOSIS — R202 Paresthesia of skin: Principal | ICD-10-CM

## 2023-11-06 DIAGNOSIS — R112 Nausea with vomiting, unspecified: Principal | ICD-10-CM

## 2023-11-06 DIAGNOSIS — R200 Anesthesia of skin: Principal | ICD-10-CM

## 2023-11-06 DIAGNOSIS — F32A Depression, unspecified depression type: Principal | ICD-10-CM

## 2023-11-06 DIAGNOSIS — M0579 Rheumatoid arthritis with rheumatoid factor of multiple sites without organ or systems involvement: Principal | ICD-10-CM

## 2023-11-06 DIAGNOSIS — M069 Rheumatoid arthritis, unspecified: Principal | ICD-10-CM

## 2023-11-06 MED ORDER — RASUVO (PF) 25 MG/0.5 ML SUBCUTANEOUS AUTO-INJECTOR
SUBCUTANEOUS | 1 refills | 28.00000 days | Status: CP
Start: 2023-11-06 — End: ?

## 2023-11-06 MED ORDER — NEEDLE (DISP) 25 GAUGE X 5/8"
ORAL | 0 refills | 0.00000 days | Status: CP
Start: 2023-11-06 — End: ?

## 2023-11-06 MED ORDER — ASPIRIN 81 MG CHEWABLE TABLET
ORAL_TABLET | Freq: Every day | ORAL | 3 refills | 36.00000 days | Status: CP
Start: 2023-11-06 — End: ?

## 2023-11-06 MED ORDER — PREDNISONE 5 MG TABLET
ORAL_TABLET | ORAL | 1 refills | 0.00000 days | Status: CP
Start: 2023-11-06 — End: ?

## 2023-11-06 MED ORDER — TRAMADOL 50 MG TABLET
ORAL_TABLET | Freq: Three times a day (TID) | ORAL | 2 refills | 30.00000 days | Status: CP | PRN
Start: 2023-11-06 — End: ?

## 2023-11-06 MED ORDER — ALENDRONATE 70 MG TABLET
ORAL_TABLET | ORAL | 3 refills | 84.00000 days | Status: CP
Start: 2023-11-06 — End: ?

## 2023-11-06 MED ORDER — FOLIC ACID 1 MG TABLET
ORAL_TABLET | Freq: Every day | ORAL | 3 refills | 90.00000 days | Status: CP
Start: 2023-11-06 — End: ?

## 2023-11-06 MED ORDER — DULOXETINE 30 MG CAPSULE,DELAYED RELEASE
ORAL_CAPSULE | Freq: Two times a day (BID) | ORAL | 3 refills | 90.00000 days | Status: CP
Start: 2023-11-06 — End: 2024-02-04

## 2023-11-06 MED ORDER — VITAMIN B-1 100 MG TABLET
ORAL_TABLET | Freq: Every day | ORAL | 3 refills | 100.00000 days | Status: CP
Start: 2023-11-06 — End: ?

## 2023-11-06 MED ORDER — ONDANSETRON HCL 4 MG TABLET
ORAL_TABLET | Freq: Three times a day (TID) | ORAL | 1 refills | 10.00000 days | Status: CP | PRN
Start: 2023-11-06 — End: 2024-11-05

## 2023-11-06 NOTE — Unmapped (Signed)
 Increase duloxetine to twice daily  Xray elbow today  Refills provided

## 2023-11-06 NOTE — Unmapped (Signed)
 Internal Medicine Clinic    Return Visit    Face to Face    Assessment and Plan:     Diagnosis ICD-10-CM Associated Orders   1. Numbness and tingling in left hand  R20.0 Ambulatory referral to Neurology    R20.2       2. Rheumatoid arthritis, involving unspecified site, unspecified whether rheumatoid factor present     M06.9 predniSONE (DELTASONE) 5 MG tablet     methotrexate, PF, (RASUVO, PF,) 25 mg/0.5 mL [name]     XR Elbow 3 Or More Views Left      3. Nausea and vomiting, unspecified vomiting type  R11.2 ondansetron (ZOFRAN) 4 MG tablet      4. Rheumatoid arthritis involving multiple sites with positive rheumatoid factor     M05.79 needle, disp, 25 gauge 25 gauge x 5/8 Ndle      5. Rheumatoid arthritis, involving unspecified site, unspecified whether rheumatoid factor present (CMS-HCC) [M06.9]  M06.9 folic acid (FOLVITE) 1 MG tablet      6. Numbness and tingling of both lower extremities  R20.0 Ambulatory referral to Neurology    R20.2 XR Elbow 3 Or More Views Left      7. Depression, unspecified depression type  F32.A DULoxetine (CYMBALTA) 30 MG capsule        Assessment & Plan  Idiopathic Neuropathy  Persistent numbness and tingling in left hand and feet, etiology unclear, differential includes idiopathic, compressive/associated with her RA, has not had vitamin def, possible cervical radiculopathy, but area of discomfort suspect lower nerve involvement. Vitamin deficiencies have been checked. Already on high-dose gabapentin in use.  - Referral to neurology for further evaluation.  - Increase duloxetine to twice daily.  - Continue current dose of Seroquel.    Rheumatoid Arthritis  - Order x-ray of the left elbow.  - Continue methotrexate and Actemra injections.   - follows with rheumatology, scheduled in Aug    Chronic Pain  Exacerbated by neuropathy and rheumatoid arthritis. Poorly controlled with current regimen.  - Refer to chronic pain program.  - Continue current pain medications.    Sleep Disturbance  Difficulty sleeping due to pain from neuropathy and rheumatoid arthritis. Anxiety and panic during pain episodes.  - Continue Seroquel for sleep  - increasing duloxetine as above      I spent a total of 28 min total including pre intra and post visit activities    Medication adherence and barriers to the treatment plan have been addressed. Opportunities to optimize healthy behaviors have been discussed. Patient / caregiver voiced understanding.      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    Spondylolisthesis of lumbar region 09/18/2018    Piriformis syndrome of both sides 03/11/2018    Stage 1 mild COPD by GOLD classification    02/25/2017    Rheumatoid arthritis involving both feet        Immunosuppressed status (HHS-HCC) 01/27/2016    Osteomyelitis    01/26/2016    Atrial flutter    12/25/2015    Rheumatoid arthritis    01/11/2015  Anxiety 02/10/2013    Depression 02/10/2013    Functional Abdominal pain 08/29/2012    Gastritis 08/26/2012    GERD (gastroesophageal reflux disease) 08/24/2012    Tobacco use disorder 01/01/2011       History of Present Illness  Bethany Meyer is a 68 year old female with rheumatoid arthritis, lumbar stenosis, and neuropathy who presents for follow-up of chronic pain and neuropathy symptoms.    She experiences persistent numbness and tingling in her hands and feet, which worsen at night and disrupt her sleep. The symptoms originate in her neck and extend down her legs to her feet, causing her to hold her feet and rock in bed. Her hands are described as 'real bad' with numbness, tingling, and occasional swelling, while her feet feel like 'brick' and slide on the floor as if she is wearing socks. Despite these symptoms, she can feel the gas pedal when driving.    She experiences weakness in her hands, having dropped many items, but can still use her arms. Her left hand is more affected than the right, with numbness extending from the hand to the forearm near the elbow. The right hand is not affected. She describes her feet as having a sensation of the veins pulling up, causing pain, and notes that her toes are drawn. She has not fallen but sometimes staggers when getting up.    She is taking her medications, including a low dose of prednisone, gabapentin, Seroquel for sleep, duloxetine, and tramadol, but feels they are not effective. Tramadol and prednisone help somewhat with swelling.    Her medical history includes rheumatoid arthritis, lumbar stenosis, neuropathy, and chronic constipation. Previous workup included cervical x-rays and tests for vitamin deficiencies, which were normal. Her TSH was slightly low, but T4 was normal. She has not seen her rheumatologist recently but is scheduled for a future appointment. She was discharged from spine care for her low back issues.    Meds ordered prior to current encounter[1]     Review of Systems - HPI    Exam:  BP 106/61  - Pulse 84  - Temp 37.1 ??C (98.8 ??F) (Oral)  - Wt 56.3 kg (124 lb 3.2 oz)  - LMP 05/20/2005  - SpO2 97%  - BMI 20.05 kg/m??   Physical Exam  GENERAL: Alert, cooperative, well developed, no acute distress.  Good radial pulses  Some left wrist swelling improved from prior  No medial or lateral olecranon ttp  Grip 5/5 R and 4/5 left  Good UE extension and resisted strength  LE without edema  2+ popliteal reflex R 1+ left  Signs of erosive arthropathy in bl feet      Grant Delton, PA-C  Supervising physician Sallyann Sima, MD           [1]   Current Outpatient Medications on File Prior to Visit   Medication Sig    acetaminophen (TYLENOL) 325 MG tablet Take 1 tablet (325 mg total) by mouth two (2) times a day as needed for pain.    arm brace Misc Boxer splint, metacarpal wrist brace    buPROPion (WELLBUTRIN XL) 150 MG 24 hr tablet Take 1 tablet (150 mg total) by mouth every morning.    dicyclomine (BENTYL) 20 mg tablet Take 1 tablet (20 mg total) by mouth four times a day as needed (stomach cramps).    empty container Misc Use as directed    gabapentin (NEURONTIN) 300 MG capsule 4 capsules three times daily  hydrOXYzine (ATARAX) 10 MG tablet Take 1 tablet (10 mg total) by mouth every six (6) hours as needed for itching.    leg brace (KNEE SUPPORT BRACE) Misc 1 each by Miscellaneous route daily as needed.    omeprazole (PRILOSEC) 20 MG capsule Take 1 capsule (20 mg total) by mouth daily.    polyethylene glycol (GLYCOLAX) 17 gram/dose powder Take 17 g by mouth daily.    QUEtiapine (SEROQUEL) 25 MG tablet 2 tablets before bed, increase to 3 tablets before bed if needed    [EXPIRED] senna-docusate (PERICOLACE) 8.6-50 mg Take 2 tablets by mouth nightly.    SYMBICORT 80-4.5 mcg/actuation inhaler INHALE 2 PUFFS IN THE MORNING AND IN THE EVENING    tocilizumab 162 mg/0.9 mL PnIj Inject the contents of 1 pen (162 mg) under the skin once a week.    umeclidinium (INCRUSE ELLIPTA) 62.5 mcg/actuation inhaler Inhale 1 puff daily.     No current facility-administered medications on file prior to visit.

## 2023-11-06 NOTE — Unmapped (Signed)
  Internal Medicine at [facility]     Reason for visit: Follow up    Questions / Concerns that need to be addressed: meds refill    Screening BP-     Omron BPs (complete if screening BP has a systolic  > 130 or diastolic > 80)  BP#1    BP#2   BP#3     Average BP   (please note this as a comment in vitals)     PTHomeBP           Dexcom or Libre CGM in use? If so, pull appropriate reporting through portal (Dexcom) or EPIC order [name]).    HCDM reviewed and updated in Epic:    We are working to make sure all of our patients??? wishes are updated in Epic and part of that is documenting a Environmental health practitioner for each patient  A Health Care Decision Maker is someone you choose who can make health care decisions for you if you are not able - who would you most want to do this for you????  is already up to date.    HCDM (patient stated preference): Vila,Tori - Daughter - [phone]        __________________________________________________________________________________________    SCREENINGS COMPLETED IN FLOWSHEETS      AUDIT       PHQ2       PHQ9          GAD7       COPD Assessment       Falls Risk  Falls Risk  Have you fallen in the past year?: No  Do you feel unsteady when standing or walking?: (!) Yes

## 2023-11-07 NOTE — Unmapped (Signed)
I was the supervising physician in the delivery of the service. Glen Blatchley A Kenasia Scheller, PA

## 2023-11-07 NOTE — Unmapped (Signed)
 Copied from CRM #[id number]. Topic: Access To Clinicians - Medication Refill  >> Nov 07, 2023  3:47 PM Hadassah BROCKS wrote:  Medication Question/ Issue from Patient - Rejection on medication     Name of medication:     methotrexate , PF, (RASUVO , PF,) 25 mg/0.5 mL [name] [[id number]]    Caller described issue: [facility]     Caller desired outcome (eg, re-send to pharmacy): States there is a rejection on the prescription please advise     Best callback number if any questions (defaults to patient's preferred phone - confirm or change):     [phone] - Ext [phone]      PCP: ERICKSON, BRITTAIN ADELLE  Last encounter in department: Visit date not found  (If more than a year, offer an appointment.)

## 2023-11-10 NOTE — Unmapped (Signed)
 Does this med require a PA?

## 2023-11-11 NOTE — Unmapped (Signed)
 Copied from CRM #[id number]. Topic: Other - Other  >> Nov 11, 2023 12:19 PM Randall BROCKS wrote:  Medication Question/ Issue from Patient    Name of medication: methotrexate , PF, (RASUVO , PF,) 25 mg/0.5 mL [name]     Caller described issue: [facility] she has made 3x attempts regarding P.A  She provider #[id number]    [phone] [phone]    Caller desired outcome (eg, re-send to pharmacy): submit PA    Best callback number if any questions (defaults to patient's preferred phone - confirm or change): [phone]  PCP: ERICKSON, BRITTAIN ADELLE  Last encounter in department: Visit date not found  (If more than a year, offer an appointment.)    Jcano  11/11/23

## 2023-11-25 DIAGNOSIS — M069 Rheumatoid arthritis, unspecified: Principal | ICD-10-CM

## 2023-11-25 NOTE — Unmapped (Signed)
 [facility] Follow-up      Bethany Meyer    Opioid pain medication agreement last signed: 12/03/2018 (will need to sing ne agreement today)    Most recent Naloxone  (NARCAN ) nasal spray Rx sent: 02/02/2019 (order pended)    Last PDMP Review: 09/10/2023  8:31 AM    Last Opioid Dispensed Provider: Grant Belle Delton, PA    Recent Visits  Date Type Provider Dept   11/06/23 Office Visit Delton Grant Belle, PA [place] [facility]       Last Drug Screen Date: 07/08/2023  Urine Toxicology Screen    Lab Results   Component Value Date    AMPHU Negative 07/08/2023    BARBU Negative 07/08/2023    BENZU Negative 07/08/2023    CANNAU Positive (A) 07/08/2023    METHU Negative 07/08/2023    COCAU Negative 07/08/2023    OPIAU Negative 07/08/2023    FENTU Negative 08/22/2020    OXYCOU Negative 07/08/2023    BUPRENORPHIN Negative 07/08/2023        Last Drug Screen Date: 07/11/2023  Opiate Confirmation Test    6-Monoacetylmorphine   Date Value Ref Range Status   07/08/2023 <5 <5 ng/mL Final     Morphine    Date Value Ref Range Status   07/08/2023 <25 <25 ng/mL Final     Codeine   Date Value Ref Range Status   07/08/2023 <25 <25 ng/mL Final     Hydrocodone    Date Value Ref Range Status   07/08/2023 <25 <25 ng/mL Final     Hydromorphone   Date Value Ref Range Status   07/08/2023 <25 <25 ng/mL Final     Oxycodone    Date Value Ref Range Status   07/08/2023 <25 <25 ng/mL Final     Oxymorphone   Date Value Ref Range Status   07/08/2023 <25 <25 ng/mL Final     Buprenorphine   Date Value Ref Range Status   07/08/2023 <5 <5 ng/mL Final     Norbuprenorphine   Date Value Ref Range Status   07/08/2023 <5 <5 ng/mL Final     Fentanyl    Date Value Ref Range Status   07/08/2023 <0.5 <0.5 ng/mL Final     Norfentanyl   Date Value Ref Range Status   07/08/2023 <1.0 <1.0 ng/mL Final       Future Appointments  Date Type Provider Dept         01/06/24 Appointment Delton Grant Belle, PA [place] [facility] [place]   Showing future appointments within next 365 days and meeting all other requirements

## 2023-11-26 NOTE — Unmapped (Signed)
 [facility] - Enhanced Care Program  Reason for Call: Medication; Type: SSC Refill Call  Referral Request: Rheumatology - Latanya Finner, CPP    Summary of Telephone Encounter  SHD has made multiple attempts to reach pt to coordinate copay card for Actemra  and then set up refill.   I called pt at phone # [phone] but had to leave a VM request to please return my call. Contact number provided.     Follow-Up:  Continue to reach out to patient for refill coordination    Call Attempt #: 1  Time Spent on Referral: 10 minutes  Number of Days Spent on Referral: 1    Marry Sleight, PharmD Candidate  Ambulatory Care Intern

## 2023-11-27 NOTE — Unmapped (Signed)
 [facility] - Enhanced Care Program  Reason for Call: Medication; Type: SSC Refill Call  Referral Request: Rheumatology - Latanya Finner, CPP    Summary of Telephone Encounter  SHD has made multiple attempts to reach pt to coordinate refill for Actemra  ACTPEN 162 mg/0.9 mL. Pt received last delivery on 10/09/2023. Pt is prescribed Actemra  162 mg subcutaneous once weekly.   I called pt at phone # [phone] but had to leave a VM request to please return my call. Contact number provided.   I also sent pt a my chart message.   Follow-Up:  Continue to reach out to patient for refill coordination    Call Attempt #: 2  Time Spent on Referral: 15 minutes  Number of Days Spent on Referral: 2    Marval Hussar RN, BSN  Nursing [facility]   Pharmacy Department  [facility]  [address]   [place], [place] [address]  [phone]

## 2023-11-28 NOTE — Unmapped (Signed)
 [facility] Refill Coordination Note    Specialty Medication(s) to be Shipped:   Inflammatory Disorders: Actemra     Other medication(s) to be shipped: No additional medications requested for fill at this time     Bethany Meyer, DOB: 1955/07/04  Phone: There are no phone numbers on file.      All above HIPAA information was verified with patient.     Was a Nurse, learning disability used for this call? No    Completed refill call assessment today to schedule patient's medication shipment from the [facility]  [phone]).  All relevant notes have been reviewed.     Specialty medication(s) and dose(s) confirmed: Regimen is correct and unchanged.   Changes to medications: Coumba reports no changes at this time.  Changes to insurance: No  New side effects reported not previously addressed with a pharmacist or physician: None reported  Questions for the pharmacist: No    Confirmed patient received a Conservation officer, historic buildings and a Surveyor, mining with first shipment. The patient will receive a drug information handout for each medication shipped and additional FDA Medication Guides as required.       DISEASE/MEDICATION-SPECIFIC INFORMATION        For patients on injectable medications: Patient currently has 1 doses left.  Next injection is scheduled for 11/28/2023.    SPECIALTY MEDICATION ADHERENCE     Medication Adherence    Specialty Medication: ACTEMRA  ACTPEN 162 mg/0.9 mL Pnij (tocilizumab )  Patient is on additional specialty medications: No              Were doses missed due to medication being on hold? No      ACTEMRA  ACTPEN 162 mg/0.9 mL Pnij (tocilizumab ): 1 doses of medicine on hand     REFERRAL TO PHARMACIST     Referral to the pharmacist: Not needed      [facility]     Shipping address confirmed in Epic.     Cost and Payment: Patient has a $0 copay, payment information is not required.    Delivery Scheduled: Yes, Expected medication delivery date: 12/02/2023. Medication will be delivered via UPS to the prescription address in Epic WAM.    [facility]  Specialty Technician

## 2023-11-28 NOTE — Unmapped (Signed)
 [facility] - Enhanced Care Program  Reason for Call: Medication; Type: SSC Refill Call  Referral Request: Rheumatology - Latanya Finner, CPP    Summary of Telephone Encounter  SHD has made multiple attempts (via telephone and MyChart) to reach pt to coordinate refill for Actemra  ACTPEN 162 mg/0.9 mL. Pt received last delivery on 10/09/2023. Pt is prescribed Actemra  162 mg subcutaneous once weekly.   Received a call back from patient requesting assistance on Actemra . Patient reports having 1 injection left, which she will do today. Patient denies any missed doses, no side effects, no new allergies. Patient has not had any medication changes in the past 30 days. I confirmed patient's address is correct on Epic.  I provided a conference call over to [facility] pharmacy. Patient does not have any copay. Patient will receive delivery on 12/02/23.    Follow-Up:  Will update Latanya Finner, CPP, and close referral     Call Attempt #: 3  Time Spent on Referral: 20 minutes  Number of Days Spent on Referral: 3    Johnsie Dove, PharmD Candidate  Ambulatory Care Intern

## 2023-11-28 NOTE — Unmapped (Addendum)
 [facility] - Enhanced Care Program  Reason for Call: Medication; Type: SSC Refill Call  Referral Request: Rheumatology - Latanya Finner, CPP    Summary of Telephone Encounter  SHD has made multiple attempts (via telephone and MyChart) to reach pt to coordinate refill for Actemra  ACTPEN 162 mg/0.9 mL. Pt received last delivery on 10/09/2023. Pt is prescribed Actemra  162 mg subcutaneous once weekly.   I called pt again at phone # [phone], attempted to leave a VM request to please return my call but mailbox full. Contact number provided.   Also sent MyChart message today with information and called daughter to get in contact with Eleana. Daughter said she would reach out to her mother to get in contact with her.     Follow-Up:  Will update Latanya Finner, CPP, and close referral     Call Attempt #: 3  Time Spent on Referral: 20 minutes  Number of Days Spent on Referral: 3    Lum Laine, PharmD  PGY2 Pharmacy Resident - Ambulatory Care

## 2023-11-30 ENCOUNTER — Emergency Department
Admit: 2023-11-30 | Discharge: 2023-11-30 | Disposition: A | Discharge status: Home / Self Care | Payer: Medicare (Managed Care) | Attending: Emergency Medicine

## 2023-11-30 DIAGNOSIS — N3090 Cystitis, unspecified without hematuria: Principal | ICD-10-CM

## 2023-11-30 DIAGNOSIS — N3000 Acute cystitis without hematuria: Principal | ICD-10-CM

## 2023-11-30 LAB — URINALYSIS WITH MICROSCOPY WITH CULTURE REFLEX PERFORMABLE
BILIRUBIN UA: NEGATIVE
GLUCOSE UA: NEGATIVE
LEUKOCYTE ESTERASE UA: NEGATIVE
NITRITE UA: POSITIVE — AB
PH UA: 6 (ref 5.0–9.0)
RBC UA: 16 /HPF — ABNORMAL HIGH (ref <=4)
SPECIFIC GRAVITY UA: 1.2 — ABNORMAL HIGH (ref 1.003–1.030)
SQUAMOUS EPITHELIAL: 2 /HPF (ref 0–5)
UROBILINOGEN UA: <2
WBC UA: 19 /HPF — ABNORMAL HIGH (ref 0–5)

## 2023-11-30 LAB — COMPREHENSIVE METABOLIC PANEL
ALBUMIN: 4.1 g/dL (ref 3.4–5.0)
ALKALINE PHOSPHATASE: 73 U/L (ref 46–116)
ALT (SGPT): 21 U/L (ref 10–49)
ANION GAP: 17 mmol/L — ABNORMAL HIGH (ref 5–14)
AST (SGOT): 51 U/L — ABNORMAL HIGH (ref <=34)
BILIRUBIN TOTAL: 0.4 mg/dL (ref 0.3–1.2)
BLOOD UREA NITROGEN: 17 mg/dL (ref 9–23)
BUN / CREAT RATIO: 20
CALCIUM: 9.9 mg/dL (ref 8.7–10.4)
CHLORIDE: 105 mmol/L (ref 98–107)
CO2: 22.3 mmol/L (ref 20.0–31.0)
CREATININE: 0.87 mg/dL (ref 0.55–1.02)
EGFR CKD-EPI (2021) FEMALE: 74 mL/min/1.73m2 (ref >=60)
GLUCOSE RANDOM: 114 mg/dL (ref 70–179)
POTASSIUM: 3.5 mmol/L (ref 3.4–4.8)
PROTEIN TOTAL: 7.7 g/dL (ref 5.7–8.2)
SODIUM: 144 mmol/L (ref 135–145)

## 2023-11-30 LAB — CBC W/ AUTO DIFF
BASOPHILS ABSOLUTE COUNT: 0.1 10*9/L (ref 0.0–0.1)
BASOPHILS RELATIVE PERCENT: 0.5 %
EOSINOPHILS ABSOLUTE COUNT: 0 10*9/L (ref 0.0–0.5)
EOSINOPHILS RELATIVE PERCENT: 0 %
HEMATOCRIT: 39.8 % (ref 34.0–44.0)
HEMOGLOBIN: 13.4 g/dL (ref 11.3–14.9)
LYMPHOCYTES ABSOLUTE COUNT: 1 10*9/L — ABNORMAL LOW (ref 1.1–3.6)
LYMPHOCYTES RELATIVE PERCENT: 8.7 %
MEAN CORPUSCULAR HEMOGLOBIN CONC: 33.8 g/dL (ref 32.0–36.0)
MEAN CORPUSCULAR HEMOGLOBIN: 32.7 pg — ABNORMAL HIGH (ref 25.9–32.4)
MEAN CORPUSCULAR VOLUME: 96.8 fL — ABNORMAL HIGH (ref 77.6–95.7)
MEAN PLATELET VOLUME: 7.5 fL (ref 6.8–10.7)
MONOCYTES ABSOLUTE COUNT: 0.7 10*9/L (ref 0.3–0.8)
MONOCYTES RELATIVE PERCENT: 5.7 %
NEUTROPHILS ABSOLUTE COUNT: 9.9 10*9/L — ABNORMAL HIGH (ref 1.8–7.8)
NEUTROPHILS RELATIVE PERCENT: 85.1 %
NUCLEATED RED BLOOD CELLS: 0 /100{WBCs} (ref <=4)
PLATELET COUNT: 299 10*9/L (ref 150–450)
RED BLOOD CELL COUNT: 4.11 10*12/L (ref 3.95–5.13)
RED CELL DISTRIBUTION WIDTH: 14.5 % (ref 12.2–15.2)
WBC ADJUSTED: 11.6 10*9/L — ABNORMAL HIGH (ref 3.6–11.2)

## 2023-11-30 LAB — LIPASE: LIPASE: 33 U/L (ref 12–53)

## 2023-11-30 LAB — HIGH SENSITIVITY TROPONIN I - SINGLE: HIGH SENSITIVITY TROPONIN I: 24 ng/L (ref <=34)

## 2023-11-30 LAB — LACTATE, VENOUS, WHOLE BLOOD
LACTATE BLOOD VENOUS: 3.6 mmol/L — ABNORMAL HIGH (ref 0.5–1.8)
LACTATE BLOOD VENOUS: 1.2 mmol/L (ref 0.5–1.8)

## 2023-11-30 MED ORDER — CEPHALEXIN 500 MG CAPSULE
ORAL_CAPSULE | Freq: Two times a day (BID) | ORAL | 0 refills | 7.00000 days | Status: CP
Start: 2023-11-30 — End: 2023-12-07

## 2023-11-30 MED ORDER — ONDANSETRON 4 MG DISINTEGRATING TABLET
ORAL_TABLET | Freq: Three times a day (TID) | ORAL | 0 refills | 5.00000 days | Status: CP | PRN
Start: 2023-11-30 — End: 2023-12-07

## 2023-11-30 MED ADMIN — ondansetron (ZOFRAN) injection 4 mg: 4 mg | INTRAVENOUS | @ 16:00:00 | Stop: 2023-11-30

## 2023-11-30 MED ADMIN — morphine injection 2 mg: 2 mg | INTRAVENOUS | @ 16:00:00 | Stop: 2023-11-30

## 2023-11-30 MED ADMIN — cefTRIAXone (ROCEPHIN) 1 g in sodium chloride 0.9 % (NS) 100 mL IVPB-MBP: 1 g | INTRAVENOUS | @ 18:00:00 | Stop: 2023-11-30

## 2023-11-30 MED ADMIN — ondansetron (ZOFRAN) injection 4 mg: 4 mg | INTRAVENOUS | @ 13:00:00 | Stop: 2023-11-30

## 2023-11-30 MED ADMIN — morphine 4 mg/mL injection 4 mg: 4 mg | INTRAVENOUS | @ 13:00:00 | Stop: 2023-11-30

## 2023-11-30 MED ADMIN — morphine 4 mg/mL injection 4 mg: 4 mg | INTRAVENOUS | @ 14:00:00 | Stop: 2023-11-30

## 2023-11-30 MED ADMIN — sodium chloride 0.9% (NS) bolus 1,000 mL: 1000 mL | INTRAVENOUS | @ 16:00:00 | Stop: 2023-11-30

## 2023-11-30 MED ADMIN — iohexol (OMNIPAQUE) 350 mg iodine/mL solution 100 mL: 100 mL | INTRAVENOUS | @ 14:00:00 | Stop: 2023-11-30

## 2023-11-30 NOTE — Unmapped (Signed)
 [facility]  Emergency Department Provider Note      ED Clinical Impression     Final diagnoses:   Cystitis (Primary)   Acute cystitis without hematuria       Initial Impression, ED Course, Assessment and Plan     Impression:     Patient is a 68 y.o. female with PMH of COPD, GERD, functional dyspepsia, and RA presenting for 2 days of severe, diffuse abdominal pain with associated nausea, emesis, and diarrhea.     On exam, the patient is uncomfortable appearing and thrashing about.  Mentating appropriately.  Borderline tachycardic.  Regular rhythm.  Lungs clear to auscultation bilaterally.  Mild tachypnea.  Abdomen diffusely tender, patient appears to be guarding though not localizing to any quadrant.    Differential includes perforated ulcer, pancreatitis, mesenteric ischemia, diverticulitis, appendicitis, cholecystitis, nephrolithiasis, pyelonephritis, aortic aneurysm or dissection, amongst others.    Plan for EKG, CT A/P, and POCUS. Plan for venous lactate, troponin, POCT glucose, lipase, and basic labs. Will give morphine  and Zofran .    Point-of-care ultrasound obtained for immediate screening/restratification.  No obvious free fluid.  Gallbladder somewhat distended but no appreciable gallstones or gallbladder wall thickening or pericholecystic fluid.  No hydronephrosis on left or right.  Aorta grossly normal.    ECG, interpretation: Sinus rhythm with occasional PVC, rate 100 bpm, normal axis normal intervals, no ST elevation or depression, no concerning T wave versions.    ED Course as of 12/02/23 1959   Sun Nov 30, 2023   1017 CBC shows mild leukocytosis of 11.6 with left shift, no anemia, normal platelets.  CMP shows normal electrolytes though anion gap of 17, normal creatinine, normal glucose, normal LFTs save for mild AST elevation of 51.  Normal lipase.  High-sensitivity troponin normal at 24.  Lactic acid elevated at 3.6.   1019 ECG 12 Lead     Urine analysis consistent with UTI, positive for nitrites and WBCs.  Will give Rocephin .    Repeat lactic acid 1.2.    CT scan showing bladder thickening consistent with UTI, hiatal hernia, distended gallbladder with no other signs of cholecystitis.  Also shows some groundglass opacities in the lower lung representing atelectasis or inflammation.  Based on clinical picture favor atelectasis.    Patient reassessed, states she is feeling much better.  No lying in bed comfortably.  Discussed results, suspect symptoms due to urinary tract infection.  Will discharge on Keflex  x 7 days.  Encouraged PCP follow-up in 1 week.  Discussed return precautions.  Patient discharged in good condition.      Additional Medical Decision Making     I independently visualized the EKG tracing. See above  I independently visualized the radiology images.  Point-of-care ultrasound, see above      Labs and radiology results that were available during my care of the patient were independently reviewed by me and considered in my medical decision making.    Portions of this record have been created using Scientist, clinical (histocompatibility and immunogenetics). Dictation errors have been sought, but may not have been identified and corrected.  ____________________________________________    I have reviewed the triage vital signs and the nursing notes.     History     Chief Complaint  Emesis      HPI   Kariel Skillman is a 68 y.o. female with a past medical history of COPD, GERD, functional dyspepsia, and RA presenting for 2 days of diffuse, 10/10 abdominal pain extending to the lower chest with  associated nausea, reddish emesis, diarrhea, and diaphoresis. Upon interview, she endorses dysuria, as well as bilateral back and leg pain. She also notes cough. Her sister states she had a bowel movement today which was at baseline. No abdominal surgical history. No history of nephrolithiasis or pancreatitis. No cardiac history. No history of liver issues. No hematochezia.        Past Medical History[1]    Problem List[2]    Past Surgical History[3]    No current facility-administered medications for this encounter.    Current Outpatient Medications:     acetaminophen  (TYLENOL ) 325 MG tablet, Take 1 tablet (325 mg total) by mouth two (2) times a day as needed for pain., Disp: , Rfl:     alendronate  (FOSAMAX ) 70 MG tablet, Take 1 tablet (70 mg total) by mouth every seven (7) days., Disp: 12 tablet, Rfl: 3    arm brace Misc, Boxer splint, metacarpal wrist brace, Disp: 1 each, Rfl: 0    aspirin  81 MG chewable tablet, Chew 1 tablet (81 mg total) before bedtime., Disp: 36 tablet, Rfl: 3    buPROPion  (WELLBUTRIN  XL) 150 MG 24 hr tablet, Take 1 tablet (150 mg total) by mouth every morning., Disp: 90 tablet, Rfl: 0    cephalexin  (KEFLEX ) 500 MG capsule, Take 2 capsules (1,000 mg total) by mouth two (2) times a day for 7 days., Disp: 28 capsule, Rfl: 0    dicyclomine  (BENTYL ) 20 mg tablet, Take 1 tablet (20 mg total) by mouth four times a day as needed (stomach cramps)., Disp: 360 tablet, Rfl: 3    DULoxetine  (CYMBALTA ) 30 MG capsule, Take 1 capsule (30 mg total) by mouth two (2) times a day., Disp: 180 capsule, Rfl: 3    empty container Misc, Use as directed, Disp: 1 each, Rfl: 2    folic acid  (FOLVITE ) 1 MG tablet, Take 1 tablet (1,000 mcg total) by mouth daily., Disp: 90 tablet, Rfl: 3    gabapentin  (NEURONTIN ) 300 MG capsule, 4 capsules three times daily, Disp: 360 capsule, Rfl: 2    hydrOXYzine  (ATARAX ) 10 MG tablet, Take 1 tablet (10 mg total) by mouth every six (6) hours as needed for itching., Disp: 270 tablet, Rfl: 1    leg brace (KNEE SUPPORT BRACE) Misc, 1 each by Miscellaneous route daily as needed., Disp: 1 each, Rfl: 0    methotrexate , PF, (RASUVO , PF,) 25 mg/0.5 mL [name], Inject the contents of 1 auto-injector (25 mg total) under the skin every seven (7) days., Disp: 2 mL, Rfl: 1    needle, disp, 25 gauge 25 gauge x 5/8 Ndle, For methotrexate  injections, Disp: 100 each, Rfl: 0    omeprazole  (PRILOSEC) 20 MG capsule, Take 1 capsule (20 mg total) by mouth daily., Disp: 100 capsule, Rfl: 3    ondansetron  (ZOFRAN -ODT) 4 MG disintegrating tablet, Take 1 tablet (4 mg total) by mouth every eight (8) hours as needed for nausea for up to 7 days., Disp: 14 tablet, Rfl: 0    polyethylene glycol (GLYCOLAX ) 17 gram/dose powder, Take 17 g by mouth daily., Disp: 1700 g, Rfl: 2    predniSONE  (DELTASONE ) 5 MG tablet, 5 mg daily and fu with rheumatology, Disp: 30 tablet, Rfl: 1    QUEtiapine  (SEROQUEL ) 25 MG tablet, 2 tablets before bed, increase to 3 tablets before bed if needed, Disp: 270 tablet, Rfl: 0    SYMBICORT  80-4.5 mcg/actuation inhaler, INHALE 2 PUFFS IN THE MORNING AND IN THE EVENING, Disp: , Rfl:  tocilizumab  162 mg/0.9 mL PnIj, Inject the contents of 1 pen (162 mg) under the skin once a week., Disp: 10.8 mL, Rfl: 3    traMADol  (ULTRAM ) 50 mg tablet, Take 1 tablet (50 mg total) by mouth every eight (8) hours as needed for pain., Disp: 90 tablet, Rfl: 2    umeclidinium (INCRUSE ELLIPTA ) 62.5 mcg/actuation inhaler, Inhale 1 puff daily., Disp: 30 each, Rfl: 2    VITAMIN B-1 100 mg tablet, Take 1 tablet (100 mg total) by mouth daily., Disp: 100 tablet, Rfl: 3    Allergies  Patient has no known allergies.    Family History[4]    Social History  Short Social History[5]    Physical Exam     Constitutional: Alert and oriented.  Uncomfortable appearing  Eyes: Conjunctivae are normal.  ENT       Head: Normocephalic and atraumatic.       Nose: No congestion.       Mouth/Throat: Mucous membranes are moist.       Neck: No stridor.  Cardiovascular: Normal rate, regular rhythm.   Respiratory: Normal respiratory effort. Breath sounds are normal.  Gastrointestinal: Soft and diffusely tender.   Musculoskeletal: Normal range of motion in all extremities. Lower extremities are without tenderness, discoloration, or edema.   Neurologic: Normal speech and language. No gross focal neurologic deficits are appreciate; normal strength and sensation  Skin: Skin is warm, dry and intact. No rash noted.  Psychiatric: Mood and affect are normal. Speech and behavior are normal.    Procedures     Procedure(s) performed: None.    Documentation assistance was provided by Coley Emms, Scribe on November 30, 2023 at 9:01 AM for Oneil Mitchell, MD    Documentation assistance was provided by the scribe in my presence.  The documentation recorded by the scribe has been reviewed by me and accurately reflects the services I personally performed.           [1]   Past Medical History:  Diagnosis Date    Arthritis     RA    Cataract     Functional dyspepsia     GERD (gastroesophageal reflux disease)     Gout     RA (rheumatoid arthritis)        Smoking     Stage 1 mild COPD by GOLD classification    02/25/2017   [2]   Patient Active Problem List  Diagnosis    Tobacco use disorder    GERD (gastroesophageal reflux disease)    Gastritis    Functional Abdominal pain    Anxiety    Depression    Rheumatoid arthritis       Atrial flutter       Immunosuppressed status (HHS-HCC)    Rheumatoid arthritis involving both feet       Stage 1 mild COPD by GOLD classification       Piriformis syndrome of both sides    Spinal stenosis of lumbar region with neurogenic claudication    Spondylolisthesis of lumbar region    Duodenal ulcer disease    Paraesophageal hernia    Osteomyelitis       Slow transit constipation    Combined forms of age-related cataract of both eyes    Enrolled in chronic care management    Other chronic pain   [3]   Past Surgical History:  Procedure Laterality Date    ovarian cyst removed      OVARY SURGERY  PR COLONOSCOPY FLX DX W/COLLJ SPEC WHEN PFRMD N/A 03/05/2013    Procedure: COLONOSCOPY, FLEXIBLE, PROXIMAL TO SPLENIC FLEXURE; DIAGNOSTIC, W/WO COLLECTION SPECIMEN BY BRUSH OR WASH;  Surgeon: Lamar GORMAN Ards, MD;  Location: GI PROCEDURES [facility];  Service: Gastroenterology    PR COMPRE EP EVAL ABLTJ 3D MAPG TX SVT N/A 12/26/2015    Procedure: A-Flutter Ablation;  Surgeon: Ashby Vick Oz, MD;  Location: [facility] EP;  Service: Cardiology    PR UP GI ENDOSCOPY,BALL DIL,30MM N/A 06/03/2019    Procedure: UGI ENDO; W/BALLOON DILAT ESOPHAGUS (<30MM DIAM);  Surgeon: Thedora Alm Plain, MD;  Location: [facility]ED[facility];  Service: Gastroenterology    PR UPPER GI ENDOSCOPY,BIOPSY N/A 09/16/2012    Procedure: UGI ENDOSCOPY; WITH BIOPSY, SINGLE OR MULTIPLE;  Surgeon: Myrick LULLA Keels, MD;  [facility];  Service: Gastroenterology    PR UPPER GI ENDOSCOPY,BIOPSY N/A 06/03/2019    Procedure: UGI ENDOSCOPY; WITH BIOPSY, SINGLE OR MULTIPLE;  Surgeon: Thedora Alm Plain, MD;  Location: [facility];  Service: Gastroenterology    PR UPPER GI ENDOSCOPY,DIAGNOSIS N/A 06/03/2019    Procedure: UGI ENDO, INCLUDE ESOPHAGUS, STOMACH, & DUODENUM &/OR JEJUNUM; DX W/WO COLLECTION SPECIMN, BY BRUSH OR WASH;  Surgeon: Thedora Alm Plain, MD;  Location: [facility];  Service: Gastroenterology    PR XCAPSL CTRC RMVL INSJ IO LENS PROSTH CPLX WO ECP Right 05/29/2022    Procedure: EXTRACAPSULAR CATARACT REMOVAL WITH INSERTION OF INTRAOCULAR LENS PROSTHESIS, COMPLEX WITHOUT ENDOSCOPIC CYCLOPHOTOCOAGULATION;  Surgeon: Ranjan, Sinthu Sivarama, MD;  Location: [facility];  Service: Ophthalmology    PR XCAPSL CTRC RMVL INSJ IO LENS PROSTH W/O ECP Left 07/03/2022    Procedure: EXTRACAPSULAR CATARACT REMOVAL W/INSERTION OF INTRAOCULAR LENS PROSTHESIS, MANUAL OR MECHANICAL TECHNIQUE WITHOUT ENDOSCOPIC CYCLOPHOTOCOAGULATION;  Surgeon: Ranjan, Sinthu Sivarama, MD;  [facility]: [facility];  Service: Ophthalmology   [4]   Family History  Problem Relation Age of Onset    Diabetes Mother         type 2    Breast cancer Mother     Diabetes Father         type 2    Cataracts Father     Macular degeneration Neg Hx     Retinal detachment Neg Hx     Colon cancer Neg Hx     Endometrial cancer Neg Hx     Ovarian cancer Neg Hx     Glaucoma Neg Hx     Blindness Neg Hx [5]   Social History  Tobacco Use    Smoking status: Every Day     Current packs/day: 1.00     Average packs/day: 1 pack/day for 35.0 years (35.0 ttl pk-yrs)     Types: Cigarettes    Smokeless tobacco: Never    Tobacco comments:     Started smoking at 33,    Vaping Use    Vaping status: Some Days   Substance Use Topics    Alcohol use: No     Alcohol/week: 0.0 standard drinks of alcohol    Drug use: Yes     Types: Marijuana     Comment: once a month        Tiaunna Buford, Oneil LABOR, MD  12/02/23 2007

## 2023-11-30 NOTE — Unmapped (Signed)
 Per sister, pt is having persistent nausea/vomiting  2 days and generalized abdominal pain for about a week. Appears restless and weak. Also reports urinary incontinent.

## 2023-12-01 MED FILL — ACTEMRA ACTPEN 162 MG/0.9 ML SUBCUTANEOUS PEN INJECTOR: SUBCUTANEOUS | 28 days supply | Qty: 3.6 | Fill #3

## 2023-12-02 DIAGNOSIS — M069 Rheumatoid arthritis, unspecified: Principal | ICD-10-CM

## 2023-12-02 DIAGNOSIS — G8929 Other chronic pain: Principal | ICD-10-CM

## 2023-12-02 NOTE — Unmapped (Addendum)
 [facility]   CHRONIC CARE MANAGEMENT OUTREACH ENCOUNTER           Date of Service:  12/02/2023      Service:  Care Coordination - phone  Is there someone else in the room? No.   Are you located in [place]? Yes  MyChart use by patient is active: no    Post-outreach Action Items:  Provider: N/A.  CM: mailing letter to pt to ask her to contact CCM .  Patient: N/A.    Follow-up Next Call: follow up with assistance with housing and referrals     Chronic Care Management (CCM) Outreach  Purpose of outreach:  Arthritis/Osteoarthritis and chronic pain    Care Manager (CM) completed the following:  Reviewed chart  ED visit - 11/30/23- Bethany Meyer is a 68 y.o. female with a past medical history of COPD, GERD, functional dyspepsia, and RA presenting for 2 days of diffuse, 10/10 abdominal pain extending to the lower chest with associated nausea, reddish emesis, diarrhea, and diaphoresis. Upon interview, she endorses dysuria, as well as bilateral back and leg pain. She also notes cough. Her sister states she had a bowel movement today which was at baseline . Diagnosed with UTI and discharged home with Keflex  abx.   11/06/23-PCP visit   Idiopathic Neuropathy  Persistent numbness and tingling in left hand and feet, etiology unclear, differential includes idiopathic, compressive/associated with her RA, has not had vitamin def, possible cervical radiculopathy, but area of discomfort suspect lower nerve involvement. Vitamin deficiencies have been checked. Already on high-dose gabapentin  in use.  - Referral to neurology for further evaluation.  - Increase duloxetine  to twice daily.  - Continue current dose of Seroquel .  Neurology has tried to reach pt without success per referral note.   Rheumatoid Arthritis  - Order x-ray of the left elbow.  - Continue methotrexate  and Actemra  injections.   - follows with rheumatology, scheduled in Aug  Chronic Pain  Exacerbated by neuropathy and rheumatoid arthritis. Poorly controlled with current regimen.  - Refer to chronic pain program.  - Continue current pain medications.  Identified Resources:   Phone call to [facility] program and spoke with Ms Laurier-  We discussed address for pt and unfortunately pt does not live within city limits , their application season is closed till fall also.   She feels pt may qualify for the home funds program with [employer] (westernization program)   CM voiced pt has tried westernization program in the past and was told she would have to sell some of her land so she declined program. Ms Laurier voices that is not a Financial risk analyst of the [employer] program but with her program (which pt does not qualify for due to location in county she lives) they put a Corporate investment banker on your home until loan is paid off (however loan is forgivable per 5K a year )   Ms Laurier suggests there may have been some misunderstanding with pt and the westernization     program. - the lady that is over that program shares an office with her and is on vacation till next week .   Plan made to call next week ( on  Friday and talk to Jeannie Felts about options for pt )   Ms Laurier can be reached at     Call placed to Ms. [employer] re: The Internal Medicine Enhanced Care team was unable to reach Bethany Meyer to follow-up with the patient in regards to the Chronic  Care Management program. We have made two attempts to contact patient. A voicemail was left with a request to return our call.     Pt returned call on 12/02/23 but CM missed this call. Tried to call pt back without success       Will place letter in mail to pt asking for call as pt does not appear to reach Mychart messages; Noted Pharmacy called dtr to have pt call them for refill and was successful, CM called dtr and spoke with dtr and asked her to have pt call CM     Planned to discuss: help with neurology appt, will make appt to call housing assistance for energy bill on a Friday when J. Felts is there per above message . Follow up on medication management.       Future Appointments   Date Time Provider Department Center   12/23/2023  9:00 AM Vien, Thersia Kurk, MD [facility]   01/06/2024  9:10 AM Rod Grant Penning, PA [facility]   01/15/2024  3:00 PM Stacia Sharper [place] , MD [facility]       A copy of this Patient Outreach/CCM Encounter was sent to patient's Primary Care Provider     CCM Documentation  Time spent in direct care with patient and/or health care proxy via non-in-person encounter(s): 5  Time spent in indirect patient care and coordination: 10

## 2023-12-03 NOTE — Unmapped (Signed)
I was the supervising physician in the delivery of the service. Glen Blatchley A Kenasia Scheller, PA

## 2023-12-06 NOTE — Unmapped (Signed)
 Urine Culture  Order: [id number] - Reflex for Order [id number]   Status: Final result    Test Result Released: Yes (not seen)    Specimen Information: Clean Catch; Urine   0 Result Notes       View Follow-Up Encounter  Urine Culture, Comprehensive >100,000 CFU/mL Escherichia coli Abnormal         Patient with a positive urine culture. Patient was started on cephalexin  (KEFLEX ) 500 MG capsule prior to dispo from ED which is susceptible. No further action needed.

## 2023-12-22 NOTE — Unmapped (Signed)
 CHRONIC PAIN INITIAL VISIT  Bethany Angle, MD      Date: December 23, 2023  Patient Name: Bethany Meyer  MRN: [id number]  PCP: Bethany Meyer    Assessment/Plan:   Provider Remarks: Bethany Meyer is a 68 y.o. female with a PMH of chronic pain secondary to RA and idiopathic neuropathy, who presented for evaluation and recommendations for chronic pain management.  - Patient's pain is not controlled.  - Benefits and risks of ongoing treatment reviewed.   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 Severe pain impacts mobility and sleep. Previous treatments ineffective. Buprenorphine  patch chosen for steady pain control and to avoid oral medications due to vomiting.  - Prescribe buprenorphine  patch for long-acting pain management.  - Continue tramadol  as needed for breakthrough pain.  - Refer to physical therapy at [facility] to strengthen back muscles and improve mobility.    Rheumatoid arthritis  Rheumatoid arthritis with significant hand pain and swelling affecting daily activities. Previous treatments include prednisone  and methotrexate .  - Continue methotrexate  as prescribed.  - Follow up with rheumatology in November.  - Butrans  patch and tramadol  PRN           - Follow-up in 6 weeks, or sooner should s/s worsen or fail to improve.  Every 12 weeks, in-person follow-up is required per pain contract to maintain active prescriptions.     I personally spent 70 minutes face-to-face and non-face-to-face in the care of this patient, which includes all pre, intra, and post visit time on the date of service.  All documented time was specific to the E/M visit and does not include any procedures that may have been performed.    Subjective:     History of Present Illness  Bethany Meyer is a 68 year old female with chronic pain and gastrointestinal issues who presents with severe leg pain and vomiting.    She experiences severe bilateral low back and leg pain with heaviness and difficulty walking, accompanied by shooting pains down both legs. This pain has persisted since her back surgery in 2022. She is unable to sleep comfortably in a bed and often sleeps in a chair with her legs elevated.    Gastrointestinal issues include vomiting and reflux since the 1980s, with a sensation of food getting stuck in her throat, leading to frequent vomiting and significant weight loss. She takes bentyl  for cramping, which is no longer effective, and prilosec for acid reflux, which provides some relief.    Rheumatoid arthritis causes swelling and pain in her hands. She takes prednisone  and tramadol  for pain management, with temporary relief, and methotrexate  weekly. Her hand swells significantly, and she experiences neck pain.    Current medications include gabapentin  three times a day, with an increased dose at night, causing constipation. Duloxetine  is taken twice a day for chronic pain and depression.                                  Relevant PMH:  RA  Idiopathic neuropathy  Insomnia    Current treatments:              Duloxetine , Tramadol     Previous treatments:  Had PT after her surgery  Tramadol  50mg  PRN helps but doesn't last long enough and doesn't help with pain at night long enough to  get good sleep    Tylenol   Gabapentin     Sleep:   Sleeping in a chair  Pain keeps me up at night  4 or less hours a night of sleep    Mood: I'm tired no SI    Substance Use History:   No alcohol  Tobacco 1ppd x 25 years  Marijuana smoking a couple times a week  No benzos or other illicit substances  No concern for substance use disorder    Goals: include: walking even short distances without significant pain. Improve ability to sleep.      ROS:    *The review of systems is negative except what was stated elsewhere in the chart.     Objective:     Vitals:    12/23/23 0906   BP: 82/59   BP Site: L Arm   BP Position: Sitting   BP Cuff Size: Medium   Pulse: 71   Temp: 36.4 ??C (97.6 ??F)   TempSrc: Temporal   SpO2: 96%   Weight: 55.5 kg (122 lb 6.4 oz)   Height: 167.6 cm (5' 5.98)        Physical Exam               Records Review:     Results  RADIOLOGY  Lumbar spine MRI: Multilevel spondyloarthropathy with severe spinal canal stenosis and narrowing at L4-L5. (2022)  IMPRESSION:   1. Acute nondisplaced fracture extending through the right   transverse process of L5.   2. Postoperative changes from interval PLIF at L4-5 with no other   complication.   3. Right foraminal disc protrusion at L3-4, closely approximating   the exiting right L3 nerve root.   4. Left foraminal to extraforaminal disc protrusion at L2-3, which   could potentially affect the exiting left L2 nerve root.   5. Bilateral nonobstructive nephrolithiasis.   6. Aortic atherosclerosis.     XR right hip 11/2020  Impression   Angulated, mildly displaced fracture of the right proximal femur greater trochanter.       Bethany Meyer  Controlled Substance Reporting System was reviewed for this patient today.   11/30/2023 09/10/2023  1 Gabapentin  300 Mg Capsule 270.00 30 Br Eri [id number] [place] (1409) 1/2  Medicare [place]   11/08/2023 09/10/2023  1 Tramadol  Hcl 50 Mg Tablet 90.00 30 Br Eri [id number] Nor (1409) 2/2 30.00 MME Private Pay [place]   10/08/2023 09/10/2023  1 Tramadol  Hcl 50 Mg Tablet 90.00 30 Br Eri [id number] Nor (1409) 1/2 30.00 MME Private Pay [facility]   10/04/2023 09/10/2023  1 Gabapentin  300 Mg Capsule 270.00 30 Br Eri [id number] Nor (1409) 0/2  Medicare [place]   09/10/2023 09/10/2023  1 Tramadol  Hcl 50 Mg Tablet 90.00 30 Br Eri [id number] Nor (1409) 0/2 30.00 MME Private Pay [place]

## 2023-12-23 DIAGNOSIS — M5442 Lumbago with sciatica, left side: Principal | ICD-10-CM

## 2023-12-23 DIAGNOSIS — G609 Hereditary and idiopathic neuropathy, unspecified: Principal | ICD-10-CM

## 2023-12-23 DIAGNOSIS — G8929 Other chronic pain: Principal | ICD-10-CM

## 2023-12-23 DIAGNOSIS — M5441 Lumbago with sciatica, right side: Principal | ICD-10-CM

## 2023-12-23 MED ORDER — TRAMADOL 50 MG TABLET
ORAL_TABLET | Freq: Three times a day (TID) | ORAL | 2 refills | 30.00000 days | Status: CP | PRN
Start: 2023-12-23 — End: 2024-03-22

## 2023-12-23 MED ORDER — BUPRENORPHINE 5 MCG/HOUR WEEKLY TRANSDERMAL PATCH
MEDICATED_PATCH | TRANSDERMAL | 5 refills | 28.00000 days | Status: CP
Start: 2023-12-23 — End: 2024-06-08

## 2023-12-23 NOTE — Unmapped (Cosign Needed)
 [place] [facility] at [facility]     Reason for visit: Follow up    Questions / Concerns that need to be addressed: no    Screening BP- 82/59 71      HCDM reviewed and updated in Epic:    We are working to make sure all of our patients??? wishes are updated in Epic and part of that is documenting a Environmental health practitioner for each patient  A Health Care Decision Maker is someone you choose who can make health care decisions for you if you are not able - who would you most want to do this for you????  is already up to date.    HCDM (patient stated preference): Tremain,Tori - Daughter - [phone]    BPAs completed:  N/A     Annual Screenings:   Tobacco  __________________________________________________________________________________________    SCREENINGS COMPLETED IN FLOWSHEETS      AUDIT       PHQ2       PHQ9          GAD7       COPD Assessment       Falls Risk

## 2023-12-29 NOTE — Unmapped (Signed)
 The [facility] has made a second and final attempt to reach this patient to refill the following medication:ACTEMRA  ACTPEN 162 mg/0.9 mL Pnij (tocilizumab ).      We have left voicemails on the following phone numbers: [phone] and have sent a text message to the following phone numbers: [phone].    Dates contacted: 8.5.25 and 8.11.25  Last scheduled delivery: 7.15.25    The patient may be at risk of non-compliance with this medication. The patient should call the [facility] at [phone]  Option 4, then Option 2: Dermatology, Gastroenterology, Rheumatology to refill medication.    Doyal Hurst   [facility]

## 2023-12-30 NOTE — Unmapped (Signed)
 [facility] Refill Coordination Note    Specialty Medication(s) to be Shipped:   Hematology/Oncology: ACTEMRA  ACTPEN 162 mg/0.9 mL Pnij (tocilizumab )    Other medication(s) to be shipped: No additional medications requested for fill at this time    Specialty Medications not needed at this time: N/A     Bethany Meyer, DOB: 1955/06/22  Phone: There are no phone numbers on file.      All above HIPAA information was verified with patient.     Was a Nurse, learning disability used for this call? No    Completed refill call assessment today to schedule patient's medication shipment from the [facility]  [phone]).  All relevant notes have been reviewed.     Specialty medication(s) and dose(s) confirmed: Regimen is correct and unchanged.   Changes to medications: Rikia reports starting the following medications: Pain Patch  Changes to insurance: No  New side effects reported not previously addressed with a pharmacist or physician: None reported  Questions for the pharmacist: No    Confirmed patient received a Conservation officer, historic buildings and a Surveyor, mining with first shipment. The patient will receive a drug information handout for each medication shipped and additional FDA Medication Guides as required.       DISEASE/MEDICATION-SPECIFIC INFORMATION        For patients on injectable medications: Patient currently has 1 doses left.  Next injection is scheduled for 01/02/2024.    SPECIALTY MEDICATION ADHERENCE     Medication Adherence    Patient reported X missed doses in the last month: 0  Specialty Medication: tocilizumab : ACTEMRA  ACTPEN 162 mg/0.9 mL Pnij  Patient is on additional specialty medications: No              Were doses missed due to medication being on hold? No     ACTEMRA  ACTPEN 162 mg/0.9 mL Pnij (tocilizumab ): 1 doses of medicine on hand       REFERRAL TO PHARMACIST     Referral to the pharmacist: Not needed      [facility]     Shipping address confirmed in Epic.     Cost and Payment: Patient has a $0 copay, payment information is not required.    Delivery Scheduled: Yes, Expected medication delivery date: 01/07/2024.     Medication will be delivered via UPS to the prescription address in Epic WAM.    [name]   [facility]  Specialty Technician

## 2024-01-04 DIAGNOSIS — M069 Rheumatoid arthritis, unspecified: Principal | ICD-10-CM

## 2024-01-04 MED ORDER — PREDNISONE 5 MG TABLET
ORAL_TABLET | Freq: Every day | ORAL | 1 refills | 0.00000 days
Start: 2024-01-04 — End: ?

## 2024-01-06 DIAGNOSIS — M069 Rheumatoid arthritis, unspecified: Principal | ICD-10-CM

## 2024-01-06 DIAGNOSIS — G8929 Other chronic pain: Principal | ICD-10-CM

## 2024-01-06 MED FILL — ACTEMRA ACTPEN 162 MG/0.9 ML SUBCUTANEOUS PEN INJECTOR: SUBCUTANEOUS | 28 days supply | Qty: 3.6 | Fill #4

## 2024-01-06 NOTE — Unmapped (Signed)
 [facility]   CHRONIC CARE MANAGEMENT OUTREACH ENCOUNTER           Date of Service:  01/06/2024      Service:  Care Coordination - phone  Is there someone else in the room? No.   Are you located in [place]? Yes  MyChart use by patient is active: no    Post-outreach Action Items:  Provider: N/A.  CM: N/A.  Patient: N/A.    Follow-up Next Call: N/A    Chronic Care Management (CCM) Outreach  Purpose of outreach:  Arthritis/Osteoarthritis and chronic pain    Care Manager (CM) completed the following:  Reviewed chart  12/23/23- chronic pain initial visit   Assessment & Plan  Chronic low back pain with bilateral lower extremity radiculopathy  Chronic low back pain with radiculopathy worsened post-surgery. Severe pain impacts mobility and sleep. Previous treatments ineffective. Buprenorphine  patch chosen for steady pain control and to avoid oral medications due to vomiting.  - Prescribe buprenorphine  patch for long-acting pain management.  - Continue tramadol  as needed for breakthrough pain.  - Refer to physical therapy at [facility] to strengthen back muscles and improve mobility.  Goals: include: walking even short distances without significant pain. Improve ability to sleep.   Identified Resources:   Film/video editor co Residential Rehabitation Program with [facility] [phone]  --Residential Rehabilitation Program - [place], [place] - Official Website   [id number] [phone]- Home Rehabilitation - [facility] [employer], [place]       Call placed to Bethany Meyer re: The Internal Medicine Enhanced Care team was unable to reach Bethany Meyer to follow-up with the patient in regards to the Chronic Care Management program. We have made one attempts to contact patient. A voicemail was left with a request to return our call.   Pt returned call on 12/02/23 but CM missed this call. Tried to call pt back without success.     Planned to discuss:help with neurology appt, will make appt to call housing assistance for energy bill on a Friday when J. Felts is there per above message . Follow up on medication management.      A copy of this Patient Outreach/CCM Encounter was sent to patient's Primary Care Provider     CCM Documentation  Time spent in direct care with patient and/or health care proxy via non-in-person encounter(s): 5  Time spent in indirect patient care and coordination: 10

## 2024-01-07 DIAGNOSIS — M069 Rheumatoid arthritis, unspecified: Principal | ICD-10-CM

## 2024-01-07 DIAGNOSIS — G8929 Other chronic pain: Principal | ICD-10-CM

## 2024-01-07 DIAGNOSIS — R1084 Generalized abdominal pain: Principal | ICD-10-CM

## 2024-01-07 MED ORDER — PREDNISONE 5 MG TABLET
ORAL_TABLET | Freq: Every day | ORAL | 1 refills | 30.00000 days | Status: CP
Start: 2024-01-07 — End: ?

## 2024-01-07 MED ORDER — DICYCLOMINE 20 MG TABLET
ORAL_TABLET | Freq: Four times a day (QID) | ORAL | 3 refills | 90.00000 days | Status: CP | PRN
Start: 2024-01-07 — End: 2025-01-06

## 2024-01-07 NOTE — Unmapped (Signed)
I was the supervising physician in the delivery of the service. Glen Blatchley A Kenasia Scheller, PA

## 2024-01-07 NOTE — Unmapped (Signed)
 [facility] Internal Medicine   CHRONIC CARE MANAGEMENT OUTREACH ENCOUNTER           Date of Service:  01/07/2024      Service:  Care Coordination - phone  Is there someone else in the room? No.   Are you located in [place]? Yes  MyChart use by patient is active: no    Post-outreach Action Items:  Provider: pt is asking for refills of her Bentyl  medication .  CM: follow up with [facility] on issues with pt  getting her Folic Acid  , call pt on Friday to talk to Westernization program in her home county in a three way call. .  Patient: N/A.        Chronic Care Management (CCM) Outreach  Purpose of outreach:  Arthritis/Osteoarthritis and chronic pain    Care Manager (CM) completed the following:  Reviewed chart  12/23/23- chronic pain initial visit   Assessment & Plan  Chronic low back pain with bilateral lower extremity radiculopathy  Chronic low back pain with radiculopathy worsened post-surgery. Severe pain impacts mobility and sleep. Previous treatments ineffective. Buprenorphine  patch chosen for steady pain control and to avoid oral medications due to vomiting.  - Prescribe buprenorphine  patch for long-acting pain management.  - Continue tramadol  as needed for breakthrough pain.  - Refer to physical therapy at [facility] to strengthen back muscles and improve mobility.  Goals: include: walking even short distances without significant pain. Improve ability to sleep.   Identified Resources:   Film/video editor co Residential Rehabitation Program with [facility] [phone]  --Residential Rehabilitation Program - [place], [place] - Official Website   [phone]- Home Rehabilitation - [facility] [employer], [place]          Call placed to Bethany Meyer re: follow up on care needs /goals     Updates  New concerns or symptoms: Yes: pt is only able to pick up her folic acid  q 30 days and is out despite script stating she can get 90 days supplies. [name]  Updates on ongoing concerns/symptoms:  Pain-  Pt voices the Butrans  Patch has been helpful, especially with her nausea , she feels her pain has decreased some.  Pt expresses a lot of pain in her knees when she tries to stand. She voices she has not heard from PT at [facility] , however from reviewing the chart it appears they tried to reach pt without success and made her an appt on 8/26- reviewed this appt date and time with pt and she wrote it down.   We discussed if needed in the future we can explore aquatic therapy due to her pain issues if needed and pt was open to this.    SDOH-   Her last electricity bill was 900$ last month, she has not gotten a bill this month. Plan made with pt to call [facility] [phone] and ask to speak to Jeannie Felts at 2pm about their program . Pt is agreeable to this plan.   New barriers: No/ none.  SDOH Screenings: Fill out any related SDOH Modules     Social Drivers of Health     Food Insecurity: No Food Insecurity (10/10/2023)    Hunger Vital Sign     Worried About Running Out of Food in the Last Year: Never true     Ran Out of Food in the Last Year: Never true   Tobacco  Use: High Risk (12/23/2023)    Patient History     Smoking Tobacco Use: Every Day     Smokeless Tobacco Use: Never     Passive Exposure: Not on file   Transportation Needs: No Transportation Needs (10/10/2023)    PRAPARE - Transportation     Lack of Transportation (Medical): No     Lack of Transportation (Non-Medical): No   Alcohol Use: Not At Risk (10/10/2023)    Alcohol Use     How often do you have a drink containing alcohol?: Never     How many drinks containing alcohol do you have on a typical day when you are drinking?: 1 - 2     How often do you have 5 or more drinks on one occasion?: Never   Housing: Low Risk  (10/10/2023)    Housing     Within the past 12 months, have you ever stayed: outside, in a car, in a tent, in an overnight shelter, or temporarily in someone else's home (i.e. couch-surfing)?: No     Are you worried about losing your housing?: No   Physical Activity: Unknown (02/02/2019)    Exercise Vital Sign     Days of Exercise per Week: Patient declined     Minutes of Exercise per Session: Patient declined   Utilities: High Risk (10/10/2023)    Utilities     Within the past 12 months, have you been unable to get utilities (heat, electricity) when it was really needed?: Yes   Stress: Unknown (02/02/2019)    [employer] of Occupational Health - Occupational Stress Questionnaire     Feeling of Stress : Patient declined   Interpersonal Safety: Not At Risk (10/10/2023)    Interpersonal Safety     Unsafe Where You Currently Live: No     Physically Hurt by Anyone: No     Abused by Anyone: No   Substance Use: Low Risk  (10/10/2023)    Substance Use     In the past year, how often have you used prescription drugs for non-medical reasons?: Never     In the past year, how often have you used illegal drugs?: Never     In the past year, have you used any substance for non-medical reasons?: Yes   Intimate Partner Violence: Not At Risk (10/10/2023)    Humiliation, Afraid, Rape, and Kick questionnaire     Fear of Current or Ex-Partner: No     Emotionally Abused: No     Physically Abused: No     Sexually Abused: No   Social Connections: Unknown (10/02/2021)    Received from [facility]    Social Network     Social Network: Not on file   Financial Resource Strain: High Risk (04/29/2023)    Overall Financial Resource Strain (CARDIA)     Difficulty of Paying Living Expenses: Hard   Health Literacy: Not on file (08/24/2020)   Internet Connectivity: Not on file     Recent hospitalization / ED / Urgent Care or providers seen outside the [facility] system:   Condition self-management: Chronic Pain:  On a scale of 1-10 (10 being the worst) what level of pain are you experiencing today?: 6  Describe type of pain: aching   Where is the pain located?:   Shoulder and knees  What makes pain better?: Butrans  has helped What makes pain worse?: inactivity, pt was out mowing this morning and while this has made her pain act up it doesn't stop her.  Are you using any over-the-counter medications for your pain? yes  If yes, which medication and how often?: Tylenol    Have you tried non-medication methods for managing pain? no    Medication Assessment: Medication assessment: taking as prescribed  Refills needed: Yes: Bentyl . Cm has sent message to RN triage team     Health Maintenance:   Health Maintenance Due   Topic Date Due    Medicare Annual Wellness Visit (AWV)  Never done    Zoster Vaccines (1 of 2) Never done    Lung Cancer Screening Shared Decision Making  Never done    DTaP/Tdap/Td Vaccines (6 - Td or Tdap) 12/31/2020    COPD Spirometry  02/25/2022    Colon Cancer Screening  03/06/2023    COVID-19 Vaccine (8 - Moderna risk 2024-25 season) 08/28/2023    Mammogram  03/11/2024       CCM Care Plan Goal Review:     Wrap-up  Reviewed upcoming clinic appointment(s): Yes.  Plans to keep appointment(s): Yes.  Transportation assistance needed: No.  Patient was encouraged to reach out to their provider with any questions or concerns    Future Appointments   Date Time Provider Department Center   01/13/2024  2:15 PM Bridgett Damien Blush, PT [facility]   01/15/2024  3:00 PM Stacia Sharper [place] , MD [facility]   02/03/2024  9:30 AM Vien, Thersia Kurk, MD [facility]       A copy of this Patient Outreach/CCM Encounter was sent to patient's Primary Care Provider     CCM Documentation  Time spent in direct care with patient and/or health care proxy via non-in-person encounter(s): 25  Time spent in indirect patient care and coordination: 10

## 2024-01-08 DIAGNOSIS — G8929 Other chronic pain: Principal | ICD-10-CM

## 2024-01-08 DIAGNOSIS — M069 Rheumatoid arthritis, unspecified: Principal | ICD-10-CM

## 2024-01-08 NOTE — Unmapped (Signed)
I was the supervising physician in the delivery of the service. Glen Blatchley A Kenasia Scheller, PA

## 2024-01-08 NOTE — Unmapped (Signed)
 Provider Panel Review:  CM met with PCP Millicent Delton  Add Panel Management Date 01/08/24 and reviewed patent's CCM enrollment goals, progression of care, and Social Determinants of Health (SDOH) barriers.     Next Panel Meeting will be next quarter. Around 04/09/24          PCP identified patient would benefit from physical therapy  resources to assist with arthritis .  PCP is pleased pt has gotten into Pain clinic and feels once pain is improved overall her health will improve     PCP identified patient's CCM care plan diagnosis are currently stable     PCP identified patient's CCM/ECM** care plan diagnosis are currently stable and patient is engaged and closely followed by pain , rheumatology

## 2024-01-13 ENCOUNTER — Ambulatory Visit: Admit: 2024-01-13 | Payer: Medicare (Managed Care)

## 2024-01-13 NOTE — Unmapped (Signed)
 [facility]  [address]  [address]  [place], [place] [address]  Phone: [phone]     Bethany Meyer did not show for their scheduled Physical Therapy Evaluation on 01/13/2024 at 2:15pm. Patient's phone number was unreachable. I contacted patient's alternative contact, daughter Metta, and informed her of missed visit with information for rescheduling. Tori reported sometimes patient has difficulty with her phone.     This is patient's 1st evaluation no call no show. Patient was contacted in an attempt to reschedule the appointment. .                   SignedBETHA Damien LELON Bridgett, PT  01/13/2024 2:54 PM

## 2024-01-15 ENCOUNTER — Ambulatory Visit
Admit: 2024-01-15 | Payer: Medicare (Managed Care) | Attending: Student in an Organized Health Care Education/Training Program | Primary: Student in an Organized Health Care Education/Training Program

## 2024-01-15 NOTE — Unmapped (Unsigned)
 Rheumatology Clinic Follow-up Note     Assessment/Plan:    Bethany Meyer is a 68 y.o.  female with a PMHx of seropositive RA, tobacco use, spinal stenosis who presents today for follow-up of RA.    Seropositive (-RF, +CCP) Non-Erosive RA: ***    No follow-ups on file.    Bethany Holt, MD  Assistant Professor of Medicine  Department of Medicine/Division of Rheumatology  [facility]    Primary Care Provider: Rod Grant Penning, [place]    HPI:  Bethany Meyer is a 68 y.o.  female with a PMHx of *** who presents today for follow-up of ***    Review of Systems:  Positive findings noted above, otherwise a 14 point review of systems was reviewed and negative    Past Medical, Surgical, Family and Social History reviewed and updated per EMR     Allergies:  Patient has no known allergies.    Medications:   Current Medications[1]      Objective   There were no vitals filed for this visit.    Physical Exam  General: well appearing, no acute distress  Eyes: EOMI, normal conjunctivae   ENT: MMM.  Oropharynx without any erythema or exudate.  No oral or nasal ulcers.  Neck: supple. No cervical lymphadenopathy  Cardiovascular: Regular rate and rhythm. Nl S1 and S2.  Pulmonary: Nl WOB on RA, CTAB.  Skin: ***no rash, lesions, breakdown. No purpura or petechiae. No digital ulcers.   Extremities: Warm and well perfused, no cyanosis, clubbing or edema  Musculoskeletal: ***No synovitis or tenderness to palpation in the hands, wrists, elbows, shoulders, knees, ankles, or feet. Full ROM throughout.   Neurologic: Cranial nerves grossly intact, strength 5/5 throughout.  Normal sensation  Psychiatric: Normal mood and affect.    I personally spent *** minutes face-to-face and non-face-to-face in the care of this patient, which includes all pre, intra, and post visit time on the date of service.  All documented time was specific to the E/M visit and does not include any procedures that may have been performed.       [1] Current Outpatient Medications:     predniSONE  (DELTASONE ) 5 MG tablet, TAKE 1 TABLET BY MOUTH DAILY, Disp: 30 tablet, Rfl: 1    acetaminophen  (TYLENOL ) 325 MG tablet, Take 1 tablet (325 mg total) by mouth two (2) times a day as needed for pain., Disp: , Rfl:     alendronate  (FOSAMAX ) 70 MG tablet, Take 1 tablet (70 mg total) by mouth every seven (7) days., Disp: 12 tablet, Rfl: 3    arm brace Misc, Boxer splint, metacarpal wrist brace, Disp: 1 each, Rfl: 0    aspirin  81 MG chewable tablet, Chew 1 tablet (81 mg total) before bedtime., Disp: 36 tablet, Rfl: 3    buprenorphine  (BUTRANS ) 5 mcg/hour PTWK transdermal patch, Place 1 patch on the skin every seven (7) days., Disp: 4 patch, Rfl: 5    buPROPion  (WELLBUTRIN  XL) 150 MG 24 hr tablet, Take 1 tablet (150 mg total) by mouth every morning., Disp: 90 tablet, Rfl: 0    dicyclomine  (BENTYL ) 20 mg tablet, Take 1 tablet (20 mg total) by mouth four times a day as needed (stomach cramps)., Disp: 360 tablet, Rfl: 3    DULoxetine  (CYMBALTA ) 30 MG capsule, Take 1 capsule (30 mg total) by mouth two (2) times a day., Disp: 180 capsule, Rfl: 3    empty container Misc, Use as directed, Disp: 1 each, Rfl: 2  folic acid  (FOLVITE ) 1 MG tablet, Take 1 tablet (1,000 mcg total) by mouth daily., Disp: 90 tablet, Rfl: 3    gabapentin  (NEURONTIN ) 300 MG capsule, 4 capsules three times daily, Disp: 360 capsule, Rfl: 2    hydrOXYzine  (ATARAX ) 10 MG tablet, Take 1 tablet (10 mg total) by mouth every six (6) hours as needed for itching., Disp: 270 tablet, Rfl: 1    leg brace (KNEE SUPPORT BRACE) Misc, 1 each by Miscellaneous route daily as needed., Disp: 1 each, Rfl: 0    methotrexate , PF, (RASUVO , PF,) 25 mg/0.5 mL [name], Inject the contents of 1 auto-injector (25 mg total) under the skin every seven (7) days., Disp: 2 mL, Rfl: 1    needle, disp, 25 gauge 25 gauge x 5/8 Ndle, For methotrexate  injections, Disp: 100 each, Rfl: 0    omeprazole  (PRILOSEC) 20 MG capsule, Take 1 capsule (20 mg total) by mouth daily., Disp: 100 capsule, Rfl: 3    polyethylene glycol (GLYCOLAX ) 17 gram/dose powder, Take 17 g by mouth daily., Disp: 1700 g, Rfl: 2    QUEtiapine  (SEROQUEL ) 25 MG tablet, 2 tablets before bed, increase to 3 tablets before bed if needed, Disp: 270 tablet, Rfl: 0    SYMBICORT  80-4.5 mcg/actuation inhaler, INHALE 2 PUFFS IN THE MORNING AND IN THE EVENING, Disp: , Rfl:     tocilizumab  (ACTEMRA  ACTPEN) 162 mg/0.9 mL PnIj subcutaneous injection, Inject the contents of 1 pen (162 mg) under the skin once a week., Disp: 10.8 mL, Rfl: 3    traMADol  (ULTRAM ) 50 mg tablet, Take 1 tablet (50 mg total) by mouth every eight (8) hours as needed for pain., Disp: 90 tablet, Rfl: 2    umeclidinium (INCRUSE ELLIPTA ) 62.5 mcg/actuation inhaler, Inhale 1 puff daily., Disp: 30 each, Rfl: 2    VITAMIN B-1 100 mg tablet, Take 1 tablet (100 mg total) by mouth daily., Disp: 100 tablet, Rfl: 3

## 2024-01-16 DIAGNOSIS — G8929 Other chronic pain: Principal | ICD-10-CM

## 2024-01-16 DIAGNOSIS — M069 Rheumatoid arthritis, unspecified: Principal | ICD-10-CM

## 2024-01-16 NOTE — Unmapped (Signed)
I was the supervising physician in the delivery of the service. Glen Blatchley A Kenasia Scheller, PA

## 2024-01-16 NOTE — Unmapped (Signed)
 [facility] Internal Medicine   CHRONIC CARE MANAGEMENT OUTREACH ENCOUNTER           Date of Service:  01/09/2024      Service:  Care Coordination - phone  Is there someone else in the room? No.   Are you located in [place]? Yes  MyChart use by patient is active: no        Post-outreach Action Items:  Provider: N/A.  CM: N/A.  Patient: N/A.    Follow-up Next Call: N/A    Purpose of contact:       See 01/07/24 CM Note for reference.    Care Manager (CM) completed the following related to the above request: follow up to call [facility] [phone] and ask to speak to Jeannie Felts at 2pm about their program     Identified resources  Westernization Program [place]   Contacted Arayla Neels  and discussed the following:    Completed three way call with pt and attempted to call Jeanie Felts , was only able to leave a message. Plan make to call pt back on three way when Ms Felts returns call.     Wrap-up  Reviewed upcoming clinic appointment(s): Yes.  Plans to keep appointment(s): No.  Transportation assistance needed: No.  Patient was encouraged to reach out to their provider with any questions or concerns    Future Appointments   Date Time Provider Department Center   02/03/2024  9:30 AM Vien, Thersia Kurk, MD [facility]               A copy of this Patient Outreach Encounter was sent to patient's Primary Care Provider.    CCM Documentation  Time spent in direct care with patient and/or health care proxy via non-in-person encounter(s): 10  Time spent in indirect patient care and coordination: 5

## 2024-01-16 NOTE — Unmapped (Signed)
 [facility] Internal Medicine   CHRONIC CARE MANAGEMENT OUTREACH ENCOUNTER           Date of Service:  01/16/2024      Service:  Care Coordination - phone  Is there someone else in the room? No.   Are you located in [place]? No  MyChart use by patient is active: no    Post-outreach Action Items:  Provider: N/A.  CM: await response from PT on cost of visit so pt can schedule visit when she gets her monthly check. .  Patient: will call her pharmacy next week to refill her Tramadol  medication. .    Follow-up Next Call: will call back to facilitate three way call with [employer] program     Chronic Care Management (CCM) Outreach  Purpose of outreach: Arthritis and Chronic Pain   Care Manager (CM) completed the following:  Reviewed chart  8/5 Tramadol  prescription is a 30 day supply of 90 pills for q8 hour -  Pt missed her 8/26 PT evaluation at [facility]   Identified Resources:   [phone]- Home Rehabilitation - [facility] [employer], [place]         Updates  New concerns or symptoms: No/ none.  Updates on ongoing concerns/symptoms:  Pain- pt did not sleep well last night,states she is out of her tramadol . Reminded pt that her script reads 30 day supply of 90 pills and it cannot be picked up till 9/5 as this is a controlled drug. Pt voices her hand is hurting a lot but she is using a heating pad and this is helping .  Physical Therapy-  Pt missed her evaulation on 8/26- she states he got a long email that she needed to pay her copay on the day of the appt but they could not tell her the amont. Pt worried she would not be able to afford it, wants to see if maybe she can go after she gets her SS check. Inbasket message to PT Damien Collier PT advising this and inquired if she was aware who we can ask about copay amount.   Finances- pt did get awarded [employer], only receives 23$ - she primaries using this for her monthly ensure. Advised pt we can obtain Ensure for her when she comes to MD appts and just ask for them. Pt voices appreciation and she will do this.  Pt has not applied for MQB medicaid to help her with her Pt B payments. Will place in mail application today for pt to  complete and mail to DSS>     New barriers: No/ none.  Phone call to [employer] with [facility] and left message asking for callback with pt on three way call.   Recent hospitalization / ED / Urgent Care or providers seen outside the [facility] system: No/ none.  Medication Assessment: see above note   Refills needed: No/ none.  Medication response/ notes: N/A.  Health Maintenance:   Health Maintenance Due   Topic Date Due    Medicare Annual Wellness Visit (AWV)  Never done    Zoster Vaccines (1 of 2) Never done    Lung Cancer Screening Shared Decision Making  Never done    DTaP/Tdap/Td Vaccines (6 - Td or Tdap) 12/31/2020    COPD Spirometry  02/25/2022    Colon Cancer Screening  03/06/2023    COVID-19 Vaccine (8 - Moderna risk 2024-25 season) 08/28/2023    Mammogram  03/11/2024  CCM Care Plan Goal Review:     Wrap-up  Reviewed upcoming clinic appointment(s): Yes.  Plans to keep appointment(s): Yes.  Transportation assistance needed: No.  Patient was encouraged to reach out to their provider with any questions or concerns    Future Appointments   Date Time Provider Department Center   02/03/2024  9:30 AM Vien, Thersia Kurk, MD [facility]       A copy of this Patient Outreach/CCM Encounter was sent to patient's Primary Care Provider     CCM Documentation  Time spent in direct care with patient and/or health care proxy via non-in-person encounter(s): 15  Time spent in indirect patient care and coordination: 20

## 2024-01-27 NOTE — Unmapped (Signed)
 [facility] Refill Coordination Note    Specialty Medication(s) to be Shipped:   Inflammatory Disorders: Actemra     Other medication(s) to be shipped: No additional medications requested for fill at this time    Specialty Medications not needed at this time: N/A     Bethany Meyer, DOB: [birth year]  Phone: There are no phone numbers on file.      All above HIPAA information was verified with patient.     Was a Nurse, learning disability used for this call? No    Completed refill call assessment today to schedule patient's medication shipment from the [facility]  [phone]).  All relevant notes have been reviewed.     Specialty medication(s) and dose(s) confirmed: Regimen is correct and unchanged.   Changes to medications: Anselma reports no changes at this time.  Changes to insurance: No  New side effects reported not previously addressed with a pharmacist or physician: None reported  Questions for the pharmacist: No    Confirmed patient received a Conservation officer, historic buildings and a Surveyor, mining with first shipment. The patient will receive a drug information handout for each medication shipped and additional FDA Medication Guides as required.       DISEASE/MEDICATION-SPECIFIC INFORMATION        For patients on injectable medications: Next injection is scheduled for 09/12.    SPECIALTY MEDICATION ADHERENCE     Medication Adherence    Patient reported X missed doses in the last month: 0  Specialty Medication: ACTEMRA  ACTPEN 162 mg/0.9 mL Pnij subcutaneous injection (tocilizumab )  Patient is on additional specialty medications: No              Were doses missed due to medication being on hold? No    ACTEMRA  ACTPEN 162 mg/0.9 mL Pnij subcutaneous injection (tocilizumab )  2 doses of medicine on hand       REFERRAL TO PHARMACIST     Referral to the pharmacist: Not needed      [facility]     Shipping address confirmed in Epic.     Cost and Payment: Patient has a $0 copay, payment information is not required.    Delivery Scheduled: Yes, Expected medication delivery date: 02/03/2024.     Medication will be delivered via UPS to the prescription address in Epic WAM.    Nelida Winfred [facility]  Specialty Technician

## 2024-01-28 MED ORDER — NALOXONE 4 MG/ACTUATION NASAL SPRAY
0 refills | 0.00000 days
Start: 2024-01-28 — End: ?

## 2024-01-28 NOTE — Unmapped (Signed)
 [facility] Follow-up      Bethany Meyer    Opioid pain medication agreement last signed: 12/23/2023    Most recent Naloxone  (NARCAN ) nasal spray Rx sent: N/A (order pended)    Last PDMP Review: 12/22/2023 11:27 AM    Last Opioid Dispensed Provider: Thersia Alycia Angle, MD    Recent Visits  Date Type Provider Dept   12/23/23 Office Visit Vien, Thersia Alycia, MD [facility] Internal Medicine [facility]   11/06/23 Office Visit Rod Grant Penning, [place] [facility] Internal Medicine [facility]       Last Drug Screen Date: 07/08/2023  Urine Toxicology Screen    Lab Results   Component Value Date    AMPHU Negative 07/08/2023    BARBU Negative 07/08/2023    BENZU Negative 07/08/2023    CANNAU Positive (A) 07/08/2023    METHU Negative 07/08/2023    COCAU Negative 07/08/2023    OPIAU Negative 07/08/2023    FENTU Negative 08/22/2020    OXYCOU Negative 07/08/2023    BUPRENORPHIN Negative 07/08/2023        Last Drug Screen Date: 07/11/2023  Opiate Confirmation Test    6-Monoacetylmorphine   Date Value Ref Range Status   07/08/2023 <5 <5 ng/mL Final     Morphine    Date Value Ref Range Status   07/08/2023 <25 <25 ng/mL Final     Codeine   Date Value Ref Range Status   07/08/2023 <25 <25 ng/mL Final     Hydrocodone    Date Value Ref Range Status   07/08/2023 <25 <25 ng/mL Final     Hydromorphone   Date Value Ref Range Status   07/08/2023 <25 <25 ng/mL Final     Oxycodone    Date Value Ref Range Status   07/08/2023 <25 <25 ng/mL Final     Oxymorphone   Date Value Ref Range Status   07/08/2023 <25 <25 ng/mL Final     Buprenorphine    Date Value Ref Range Status   07/08/2023 <5 <5 ng/mL Final     Norbuprenorphine   Date Value Ref Range Status   07/08/2023 <5 <5 ng/mL Final     Fentanyl    Date Value Ref Range Status   07/08/2023 <0.5 <0.5 ng/mL Final     Norfentanyl   Date Value Ref Range Status   07/08/2023 <1.0 <1.0 ng/mL Final

## 2024-01-30 DIAGNOSIS — M069 Rheumatoid arthritis, unspecified: Principal | ICD-10-CM

## 2024-02-02 NOTE — Unmapped (Signed)
 CHRONIC PAIN FOLLOW UP VISIT  Bethany Angle, Bethany Meyer      Date: February 03, 2024  Patient Name: Bethany Meyer  MRN: [id number]  PCP: Bethany Meyer    Assessment/Plan:    Bethany Meyer is a 68 y.o. female with a PMH of chronic pain secondary to RA and idiopathic neuropathy, who presented for evaluation and recommendations for chronic pain management.  - Patient's pain is not controlled.  - Benefits and risks of ongoing treatment reviewed.   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  - Discontinue marijuana for one month to assess impact.  - Initiate Haldol  0.5-1 mg up to three times daily, starting at night. Adjust based on sedation. Discontinued  Seroquel .  - Refill dicyclomine  for cramping abdominal pain.  - Follow up in four weeks to assess response.    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 Current tramadol  regimen insufficient.  - Increase tramadol  to every six hours.    Rheumatoid arthritis with chronic bilateral hand and lower extremity pain and intermittent swelling  Chronic rheumatoid arthritis with significant joint pain and swelling. Current treatment includes methotrexate .  - Messaging rheumatology for new appointment and to see if they agree with prednisone  in the meantime.  - Consider colchicine in future for joint pain.    Constipation secondary to medication  Chronic constipation likely due to gabapentin . Current strategies ineffective.  - Consider Movantik  in  the future.      Tobacco use disorder  Chronic tobacco use, approximately four to five cigarettes daily.   - Continue nicotine  gum.       - Follow-up in 4 weeks, or sooner should s/s worsen or fail to improve.  Every 12 weeks, in-person follow-up is required per pain contract to maintain active prescriptions.     I personally spent 45 minutes face-to-face and non-face-to-face in the care of this patient, which includes all pre, intra, and post visit time on the date of service.  All documented time was specific to the E/M visit and does not include any procedures that may have been performed.    Subjective:     History of Present Illness  Bethany Meyer is a 68 year old female with rheumatoid arthritis who presents with severe pain, vomiting, and swelling.    Severe pain and vomiting with episode  ranging  for  every other day to a couple days/week. Taking a hot bath helps. Dicyclomine  helps the stomach cramps a little.    Butrans  patches have been used for 4 weeks without improvement. Tramadol  is taken every eight hours, reducing hand swelling but not significantly alleviating pain.     Stomach pain is described as severe cramps, preventing eating or drinking. Nutrition is maintained with Ensure, applesauce, toast, and smoothies. Pain and vomiting cycles last for two days, affecting sleep and appetite. Marijuana is used to stimulate appetite, smoked at night or when waking in pain.    Feet swell and sting with pressure, and there is difficulty spreading toes. Joint swelling and pain, particularly in arms and feet, make walking difficult. Various medications have been tried, including gabapentin  and dicyclomine . Hot baths provide temporary pain relief, and sleep is difficult, often waking after an hour. No fever is experienced during episodes.  Relevant PMH:  RA  Idiopathic neuropathy  Insomnia     Current treatments:              Duloxetine , Tramadol , gabapentin      Previous treatments:  Had PT after her surgery  Tramadol  50mg  PRN helps but doesn't last long enough and doesn't help with pain at night long enough to get good sleep  Tylenol   Butrans - didn't notice any benefit     Sleep: Very poor, approx 1-2 hours at a time       Goals: include: walking even short distances without significant pain. Improve ability to sleep. ROS:    *The review of systems is negative except what was stated elsewhere in the chart.     Objective:     Vitals:    02/03/24 0917   BP: 135/78   BP Site: L Arm   BP Position: Sitting   BP Cuff Size: Medium   Pulse: 82   Temp: 36.1 ??C (97 ??F)   TempSrc: Temporal   SpO2: 96%   Weight: 56.2 kg (124 lb)   Height: 167.6 cm (5' 5.98)        Physical Exam               Records Review:     Results        [place]  Controlled Substance Reporting System was reviewed for this patient today.   Filled  Written  Sold  ID  Drug  QTY  Days  Prescriber  RX #  Dispenser  Refill  Daily Dose*  Pymt Type  PMP    01/27/2024 09/10/2023  1 Gabapentin  300 Mg Capsule 270.00 30 Br Eri [id number] Nor (1409) 3/2  Medicare [place]   01/22/2024 12/23/2023  1 Tramadol  Hcl 50 Mg Tablet 90.00 30 Al Vie [id number] Nor (1409) 1/2 30.00 MME Medicare [place]   12/28/2023 09/10/2023  1 Gabapentin  300 Mg Capsule 270.00 30 Br Eri [id number] Nor (1409) 2/2  Medicare [place]   12/23/2023 12/23/2023  1 Tramadol  Hcl 50 Mg Tablet 90.00 30 Al Vie [id number] Nor (1409) 0/2 30.00 MME Private Pay [place]   12/23/2023 12/23/2023  1 Buprenorphine  5 Mcg/hr Patch 4.00 28 Al Vie [id number] Nor (1409)

## 2024-02-02 NOTE — Unmapped (Signed)
 Bethany Meyer 's Actemra  shipment will be canceled as a result of PA denied.     I have reached out to the patient  at [phone] and communicated the delay. We will not reschedule the medication and have removed this/these medication(s) from the work request.  We have canceled this work request.

## 2024-02-03 DIAGNOSIS — G8929 Other chronic pain: Principal | ICD-10-CM

## 2024-02-03 DIAGNOSIS — M25579 Pain in unspecified ankle and joints of unspecified foot: Principal | ICD-10-CM

## 2024-02-03 DIAGNOSIS — R1084 Generalized abdominal pain: Principal | ICD-10-CM

## 2024-02-03 DIAGNOSIS — R1115 Cyclical vomiting syndrome unrelated to migraine: Principal | ICD-10-CM

## 2024-02-03 DIAGNOSIS — M5441 Lumbago with sciatica, right side: Principal | ICD-10-CM

## 2024-02-03 DIAGNOSIS — M5442 Lumbago with sciatica, left side: Principal | ICD-10-CM

## 2024-02-03 MED ORDER — NALOXONE 4 MG/ACTUATION NASAL SPRAY
NASAL | 0 refills | 0.00000 days | Status: CP
Start: 2024-02-03 — End: ?

## 2024-02-03 MED ORDER — TRAMADOL 50 MG TABLET
ORAL_TABLET | Freq: Four times a day (QID) | ORAL | 0 refills | 30.00000 days | Status: CP | PRN
Start: 2024-02-03 — End: 2024-03-04

## 2024-02-03 MED ORDER — HALOPERIDOL 1 MG TABLET
ORAL_TABLET | Freq: Three times a day (TID) | ORAL | 2 refills | 30.00000 days | Status: CP
Start: 2024-02-03 — End: 2025-02-02

## 2024-02-03 MED ORDER — DICYCLOMINE 20 MG TABLET
ORAL_TABLET | Freq: Four times a day (QID) | ORAL | 3 refills | 90.00000 days | Status: CP | PRN
Start: 2024-02-03 — End: 2025-02-02

## 2024-02-06 NOTE — Unmapped (Signed)
 need to schedule with Ronnald or Dr Stacia. return appt referral was sent over.

## 2024-02-10 DIAGNOSIS — F17200 Nicotine dependence, unspecified, uncomplicated: Principal | ICD-10-CM

## 2024-02-10 DIAGNOSIS — M1990 Unspecified osteoarthritis, unspecified site: Principal | ICD-10-CM

## 2024-02-10 NOTE — Unmapped (Signed)
 [facility] Internal Medicine   CHRONIC CARE MANAGEMENT OUTREACH ENCOUNTER           Date of Service:  02/10/2024      Service:  Care Coordination - phone  Is there someone else in the room? No.   Are you located in [place]? Yes  MyChart use by patient is active: no    Post-outreach Action Items:  Provider: N/A.  CM: N/A.  Patient: asked for callback .    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  - Discontinue marijuana for one month to assess impact.  - Initiate Haldol  0.5-1 mg up to three times daily, starting at night. Adjust based on sedation. Discontinued  Seroquel .  - Refill dicyclomine  for cramping abdominal pain.  - Follow up in four weeks to assess response.  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 Current tramadol  regimen insufficient.  - Increase tramadol  to every six hours.  Tobacco use disorder  Chronic tobacco use, approximately four to five cigarettes daily.   - Continue nicotine  gum.     Identified Resources:   English as a second language teacher Assistance Program [phone]      Call placed to Ms. Ent re: The Internal Medicine Enhanced Care team was unable to reach Lucillia Boruff to follow-up with the patient in regards to the Chronic Care Management program. We have made one attempts to contact patient. A voicemail was left with a request to return our call.     Planned to discuss: how pain management is going with increase of Tramadol  and if her vomiting has improved with stopping marijuana use. Complete application with pt for weatherization program         Future Appointments   Date Time Provider Department Center 03/02/2024 11:00 AM Vien, Thersia Kurk, MD [facility]   04/29/2024  1:40 PM Stacia Sharper [place] , MD [facility]       A copy of this Patient Outreach/CCM Encounter was sent to patient's Primary Care Provider     CCM Documentation  Time spent in direct care with patient and/or health care proxy via non-in-person encounter(s): 6  Time spent in indirect patient care and coordination: 15

## 2024-02-11 NOTE — Unmapped (Signed)
I was the supervising physician in the delivery of the service. Glen Blatchley A Kenasia Scheller, PA

## 2024-02-12 DIAGNOSIS — M069 Rheumatoid arthritis, unspecified: Principal | ICD-10-CM

## 2024-02-12 DIAGNOSIS — Z79631 Methotrexate, long term, current use: Principal | ICD-10-CM

## 2024-02-12 MED ORDER — TYENNE AUTOINJECTOR 162 MG/0.9 ML SUBCUTANEOUS PEN INJECTOR
SUBCUTANEOUS | 3 refills | 0.00000 days | Status: CP
Start: 2024-02-12 — End: ?
  Filled 2024-03-09: qty 3.6, 28d supply, fill #0

## 2024-02-12 NOTE — Unmapped (Unsigned)
 The [facility] has made a third and final attempt to reach this patient to refill the following medication:[name]  onboarding.      We have left voicemails on the following phone numbers: [phone], have sent a MyChart message, and have sent a text message to the following phone numbers: [phone].    Dates contacted: 9/25, 9/29, 10/3, 10/6  Last scheduled delivery: UNABLE TO REACH PATIENT TO ONBOARD    The patient may be at risk of non-compliance with this medication. The patient should call the [facility] at [phone]  Option 4, then Option 2: Dermatology, Gastroenterology, Rheumatology to refill medication.    Rosalynn GORMAN Kin, PharmD   [facility] Specialty Pharmacist auto-injector pen:     Gather all supplies needed for injection on a clean, flat working surface: medication pen removed from packaging, alcohol swab, sharps container, etc.  Look at the medication label - look for correct medication, correct dose, and check the expiration date  Remove the sealed tray from the carton.  Let the sealed tray sit on a clean surface for 45 minutes before use to allow the medicine in the autoinjector to reach room temperature  Peel the seal off the tray and turn tray upside down to remove single-dose autoinjector  Look at the medication - the liquid visible in the window on the side of the pen device should appear clear and colorless to pale yellow and should not contain any flakes or particles.  Check the autoinjector to make sure it is not cracked or damaged.  Air bubbles in the medicine are normal.   Select injection site - you can use the front of your thigh or your belly (but not the area 2 inches around your belly button); if someone else is giving you the injection you can also use your upper arm in the skin covering your triceps muscle  Prepare injection site - wash your hands and clean the skin at the injection site with an alcohol swab and let it air dry, do not touch the injection site again before the injection  Hold the auto-injector with the clear cap on top and pull off the clear safety cap, do not remove until immediately prior to injection.  Use the autoinjector within 3 minutes after removing cap to avoid contamination and do not touch the needle shield (orange)  Do NOT Pinch your skin. Put the orange needle shield against your skin at the injection site at a 90 degree angle, hold the pen such that you can see the clear medication window  In a single motion, push and hold the autoinjector firmly against your skin until you hear a first click. This means the injection has started.   The orange plunger rod will move through the window during the injection.   Hold the autoinjector in place until you hear a second click. This may take up to 10 seconds.   There will be a second click sound when the injection is complete, verify the window is solid purple before pulling the pen away from your skin.  Wait and slowly count to 5 after you hear the second click. Continue to hold the autoinjector in place to make sure you inject a full dose. Do not lift the autoinjector until you are sure 5 seconds has passed, and the injection is complete.   Dispose of the used auto-injector pen immediately in your sharps disposal container the needle will be covered automatically  If you see  any blood at the injection site, press a cotton ball or gauze on the site and maintain pressure until the bleeding stops, do not rub the injection site  [name]    Adherence/Missed dose instructions:  If your injection is given more than 2 days after your scheduled injection date - consult your pharmacist for additional instructions on how to adjust your dosing schedule.    Goals of Therapy     Rheumatoid arthritis  Achieve low disease activity or remission  Slow disease progression  Protection of remaining articular structures  Maintenance of function  Maintenance of effective psychosocial functioning    Side Effects & Monitoring Parameters     Injection site reaction (redness, irritation, inflammation localized to the site of administration)  Signs of a common cold - minor sore throat, runny or stuffy nose, etc.  Headache    The following side effects should be reported to the provider:  Signs of a hypersensitivity reaction - rash; hives; itching; red, swollen, blistered, or peeling skin; wheezing; tightness in the chest or throat; difficulty breathing, swallowing, or talking; swelling of the mouth, face, lips, tongue, or throat; etc.  Reduced immune function - report signs of infection such as fever; chills; body aches; very bad sore throat; ear or sinus pain; cough; more sputum or change in color of sputum; pain with passing urine; wound that will not heal, etc.  Also at a slightly higher risk of some malignancies (mainly skin and blood cancers) due to this reduced immune function.  In the case of signs of infection - the patient should hold the next dose of medication and call your primary care provider to ensure adequate medical care.  Treatment may be resumed when infection is treated and patient is asymptomatic.  Signs of unexplained bruising or bleeding - throwing up blood or emesis that looks like coffee grounds; black, tarry, or bloody stool; etc.  Changes in skin - a new growth or lump that forms; changes in shape, size, or color of a previous mole or marking  Shortness of breath  Chest pain/pressure      Warnings, Precautions, & Contraindications     Have your bloodwork checked as you have been told by your prescriber (ANC, platelets, lipids, etc.)  Patients with concomitant IBD or history of diverticulitis are at an increased risk for GI perforations.  Birth control pills and other hormone-based birth control may not work as well to prevent pregnancy  Talk with your doctor if you are pregnant, planning to become pregnant, or breastfeeding  Discuss the possible need for holding your dose(s) of medication when a planned procedure is scheduled with the prescriber as it may delay healing/recovery timeline     Drug/Food Interactions     Medication list reviewed in Epic. The patient was instructed to inform the care team before taking any new medications or supplements.    [name]  may decrease serum concentrations of Omeprazole . UpToDate Risk Rating B: No Action Needed   Talk with you prescriber or pharmacist before receiving any live vaccinations while taking this medication and after you stop taking it    Storage, Handling Precautions, & Disposal     Store at 2??C to 8??C (36??F to 46??F). Do not freeze.   Protect from light (store in the original package until time of use).   Prefilled syringes and autoinjectors must always be kept dry; may store up to 2 weeks at <=25??C (<=77??F) ([name] ) once removed from the refrigerator.  Do not shake  Dispose  of used syringes/pens in a sharps disposal container        Current Medications (including OTC/herbals), Comorbidities and Allergies     Current Medications[1]    Allergies[2]    Problem List[3]    Medication list has been reviewed and updated in Epic: Yes    Allergies have been reviewed and updated in Epic: Yes    Appropriateness of Therapy     Acute infections noted within Epic:  No active infections  Patient reported infection: {Blank single:19197::None,***- patient reported to provider,***- pharmacy reported to provider}    Is the medication and dose appropriate considering the patient???s diagnosis, treatment, and disease journey, comorbidities, medical history, current medications, allergies, therapeutic goals, self-administration ability, and access barriers? Yes    Prescription has been clinically reviewed: Yes      Baseline Quality of Life Assessment      How many days over the past month did your RA  keep you from your normal activities? For example, brushing your teeth or getting up in the morning. {Blank:19197::***,0,Patient declined to answer}    Financial Information     Medication Assistance provided: Prior Authorization    Anticipated copay of $0 / 28 days reviewed with patient. Verified delivery address.    Delivery Information     Scheduled delivery date: ***    Expected start date: ***      Medication will be delivered via {Blank:19197::UPS,Next Day Courier,Same Day Courier,Clinic Courier - *** clinic,***} to the {Blank:19197::prescription,temporary} address in Epic WAM.  This shipment {Blank single:19197::will,will not} require a signature.      Explained the services we provide at [facility] and that each month we would call to set up refills.  Stressed importance of returning phone calls so that we could ensure they receive their medications in time each month.  Informed patient that we should be setting up refills 7-10 days prior to when they will run out of medication.  A pharmacist will reach out to perform a clinical assessment periodically.  Informed patient that a welcome packet, containing information about our pharmacy and other support services, a Notice of Privacy Practices, and a drug information handout will be sent.      The patient or caregiver noted above participated in the development of this care plan and knows that they can request review of or adjustments to the care plan at any time.      Patient or caregiver verbalized understanding of the above information as well as how to contact the pharmacy at [phone] option 4 with any questions/concerns.  The pharmacy is open Monday through Friday 8:30am-4:30pm.  A pharmacist is available 24/7 via pager to answer any clinical questions they may have.    Patient Specific Needs     Does the patient have any physical, cognitive, or cultural barriers? No    Does the patient have adequate living arrangements? (i.e. the ability to store and take their medication appropriately) Yes    Did you identify any home environmental safety or security hazards? No    Patient prefers to have medications discussed with  Patient     Is the patient or caregiver able to read and understand education materials at a high school level or above? Yes    Patient's primary language is  English     Is the patient high risk? No    Does the patient have an additional or emergency contact listed in their chart? Yes    SOCIAL DETERMINANTS  OF HEALTH     At the [facility], we have learned that life circumstances - like trouble affording food, housing, utilities, or transportation can affect the health of many of our patients.   That is why we wanted to ask: are you currently experiencing any life circumstances that are negatively impacting your health and/or quality of life? Patient declined to answer    Social Drivers of Health     Food Insecurity: No Food Insecurity (10/10/2023)    Hunger Vital Sign     Worried About Running Out of Food in the Last Year: Never true     Ran Out of Food in the Last Year: Never true   Tobacco Use: High Risk (02/03/2024)    Patient History     Smoking Tobacco Use: Every Day     Smokeless Tobacco Use: Never     Passive Exposure: Not on file   Transportation Needs: No Transportation Needs (10/10/2023)    PRAPARE - Transportation     Lack of Transportation (Medical): No     Lack of Transportation (Non-Medical): No   Alcohol Use: Not At Risk (10/10/2023)    Alcohol Use     How often do you have a drink containing alcohol?: Never     How many drinks containing alcohol do you have on a typical day when you are drinking?: 1 - 2     How often do you have 5 or more drinks on one occasion?: Never   Housing: Low Risk  (10/10/2023)    Housing     Within the past 12 months, have you ever stayed: outside, in a car, in a tent, in an overnight shelter, or temporarily in someone else's home (i.e. couch-surfing)?: No     Are you worried about losing your housing?: No   Physical Activity: Unknown (02/02/2019)    Exercise Vital Sign     Days of Exercise per Week: Patient declined     Minutes of Exercise per Session: Patient declined   Utilities: High Risk (10/10/2023)    Utilities     Within the past 12 months, have you been unable to get utilities (heat, electricity) when it was really needed?: Yes   Stress: Unknown (02/02/2019)    [employer] of Occupational Health - Occupational Stress Questionnaire     Feeling of Stress : Patient declined   Interpersonal Safety: Not At Risk (10/10/2023)    Interpersonal Safety     Unsafe Where You Currently Live: No     Physically Hurt by Anyone: No     Abused by Anyone: No   Substance Use: Low Risk  (10/10/2023)    Substance Use     In the past year, how often have you used prescription drugs for non-medical reasons?: Never     In the past year, how often have you used illegal drugs?: Never     In the past year, have you used any substance for non-medical reasons?: Yes   Intimate Partner Violence: Not At Risk (10/10/2023)    Humiliation, Afraid, Rape, and Kick questionnaire     Fear of Current or Ex-Partner: No     Emotionally Abused: No     Physically Abused: No     Sexually Abused: No   Social Connections: Unknown (10/02/2021)    Received from [facility]    Social Network     Social Network: Not on file   Financial Resource Strain: High Risk (04/29/2023)    Overall Financial  Resource Strain (CARDIA)     Difficulty of Paying Living Expenses: Hard   Health Literacy: Not on file (08/24/2020)   Internet Connectivity: Not on file       Would you be willing to receive help with any of the needs that you have identified today? Not applicable       Velma DELENA Eon, PharmD  [facility] Specialty Pharmacist         [1]   Current Outpatient Medications   Medication Sig Dispense Refill    predniSONE  (DELTASONE ) 5 MG tablet TAKE 1 TABLET BY MOUTH DAILY 30 tablet 1    acetaminophen  (TYLENOL ) 325 MG tablet Take 1 tablet (325 mg total) by mouth two (2) times a day as needed for pain.      alendronate  (FOSAMAX ) 70 MG tablet Take 1 tablet (70 mg total) by mouth every seven (7) days. 12 tablet 3    arm brace Misc Boxer splint, metacarpal wrist brace 1 each 0    aspirin  81 MG chewable tablet Chew 1 tablet (81 mg total) before bedtime. 36 tablet 3    buPROPion  (WELLBUTRIN  XL) 150 MG 24 hr tablet Take 1 tablet (150 mg total) by mouth every morning. 90 tablet 0    dicyclomine  (BENTYL ) 20 mg tablet Take 1 tablet (20 mg total) by mouth four times a day as needed (stomach cramps). 360 tablet 3    empty container Misc Use as directed 1 each 2    folic acid  (FOLVITE ) 1 MG tablet Take 1 tablet (1,000 mcg total) by mouth daily. 90 tablet 3    gabapentin  (NEURONTIN ) 300 MG capsule 4 capsules three times daily 360 capsule 2 haloperidol  (HALDOL ) 1 MG tablet Take 1 tablet (1 mg total) by mouth Three (3) times a day. 90 tablet 2    hydrOXYzine  (ATARAX ) 10 MG tablet Take 1 tablet (10 mg total) by mouth every six (6) hours as needed for itching. 270 tablet 1    leg brace (KNEE SUPPORT BRACE) Misc 1 each by Miscellaneous route daily as needed. 1 each 0    methotrexate , PF, (RASUVO , PF,) 25 mg/0.5 mL [name] Inject the contents of 1 auto-injector (25 mg total) under the skin every seven (7) days. 2 mL 1    naloxone  (NARCAN ) 4 mg nasal spray One spray in either nostril once for known/suspected opioid overdose. May repeat every 2-3 minutes in alternating nostril til EMS arrives 2 each 0    needle, disp, 25 gauge 25 gauge x 5/8 Ndle For methotrexate  injections 100 each 0    omeprazole  (PRILOSEC) 20 MG capsule Take 1 capsule (20 mg total) by mouth daily. 100 capsule 3    polyethylene glycol (GLYCOLAX ) 17 gram/dose powder Take 17 g by mouth daily. 1700 g 2    SYMBICORT  80-4.5 mcg/actuation inhaler INHALE 2 PUFFS IN THE MORNING AND IN THE EVENING      tocilizumab -aazg ([name]  AUTOINJECTOR) 162 mg/0.9 mL PnIj Inject the contents of 1 pen (162 mg) under the skin once a week. 10.8 mL 3    traMADol  (ULTRAM ) 50 mg tablet Take 1 tablet (50 mg total) by mouth every six (6) hours as needed for pain. 120 tablet 0    umeclidinium (INCRUSE ELLIPTA ) 62.5 mcg/actuation inhaler Inhale 1 puff daily. 30 each 2    VITAMIN B-1 100 mg tablet Take 1 tablet (100 mg total) by mouth daily. 100 tablet 3     No current facility-administered medications for this visit.   [  2] No Known Allergies  [3]   Patient Active Problem List  Diagnosis    Tobacco use disorder    GERD (gastroesophageal reflux disease)    Gastritis    Functional Abdominal pain    Anxiety    Depression    Arthritis    Atrial flutter    (CMS-HCC)    Immunosuppressed status (HHS-HCC)    Rheumatoid arthritis involving both feet    (CMS-HCC)    Stage 1 mild COPD by GOLD classification    (CMS-HCC) Piriformis syndrome of both sides    Spinal stenosis of lumbar region with neurogenic claudication    Spondylolisthesis of lumbar region    Duodenal ulcer disease    Paraesophageal hernia    Osteomyelitis    (CMS-HCC)    Slow transit constipation    Combined forms of age-related cataract of both eyes    Enrolled in chronic care management    Other chronic pain

## 2024-02-12 NOTE — Unmapped (Signed)
 [facility] Specialty Medication Onboarding    Specialty Medication: [name]   Prior Authorization: Approved   Financial Assistance: No - copay  <$25  Final Copay/Day Supply: $0 / 28 days    Insurance Restrictions: Yes - max 1 month supply     Notes to Pharmacist:   Credit Card on File: not applicable  Start Date on Rx:      The triage team has completed the benefits investigation and has determined that the patient is able to fill this medication at [facility]. Please contact the patient to complete the onboarding or follow up with the prescribing physician as needed.

## 2024-02-12 NOTE — Unmapped (Signed)
 [facility] - PHARMACIST NOTES    Actemra  PA denied, insurance has step therapy requirement, including preference for biosimilar [name] .   Discussed with patient and she is fine with biosmilars - [name]  script sent to [facility] to repeat benefit investigation.     Latanya

## 2024-02-15 ENCOUNTER — Inpatient Hospital Stay
Admission: EM | Admit: 2024-02-15 | Discharge: 2024-02-19 | DRG: 689 | Disposition: A | Discharge status: Home / Self Care

## 2024-02-15 ENCOUNTER — Emergency Department

## 2024-02-15 ENCOUNTER — Other Ambulatory Visit: Payer: Self-pay

## 2024-02-15 DIAGNOSIS — R112 Nausea with vomiting, unspecified: Principal | ICD-10-CM

## 2024-02-15 DIAGNOSIS — K5900 Constipation, unspecified: Secondary | ICD-10-CM | POA: Diagnosis present

## 2024-02-15 DIAGNOSIS — Z79899 Other long term (current) drug therapy: Secondary | ICD-10-CM

## 2024-02-15 DIAGNOSIS — M545 Low back pain, unspecified: Secondary | ICD-10-CM | POA: Diagnosis present

## 2024-02-15 DIAGNOSIS — S43036A Inferior dislocation of unspecified humerus, initial encounter: Secondary | ICD-10-CM | POA: Diagnosis not present

## 2024-02-15 DIAGNOSIS — Y9389 Activity, other specified: Secondary | ICD-10-CM

## 2024-02-15 DIAGNOSIS — G629 Polyneuropathy, unspecified: Secondary | ICD-10-CM

## 2024-02-15 DIAGNOSIS — N10 Acute tubulo-interstitial nephritis: Secondary | ICD-10-CM | POA: Diagnosis not present

## 2024-02-15 DIAGNOSIS — G8929 Other chronic pain: Secondary | ICD-10-CM | POA: Diagnosis present

## 2024-02-15 DIAGNOSIS — E860 Dehydration: Secondary | ICD-10-CM | POA: Diagnosis present

## 2024-02-15 DIAGNOSIS — I480 Paroxysmal atrial fibrillation: Secondary | ICD-10-CM | POA: Diagnosis present

## 2024-02-15 DIAGNOSIS — Z803 Family history of malignant neoplasm of breast: Secondary | ICD-10-CM

## 2024-02-15 DIAGNOSIS — S43014A Anterior dislocation of right humerus, initial encounter: Secondary | ICD-10-CM | POA: Diagnosis not present

## 2024-02-15 DIAGNOSIS — Z981 Arthrodesis status: Secondary | ICD-10-CM

## 2024-02-15 DIAGNOSIS — Z833 Family history of diabetes mellitus: Secondary | ICD-10-CM

## 2024-02-15 DIAGNOSIS — M069 Rheumatoid arthritis, unspecified: Secondary | ICD-10-CM | POA: Diagnosis present

## 2024-02-15 DIAGNOSIS — R197 Diarrhea, unspecified: Secondary | ICD-10-CM | POA: Diagnosis present

## 2024-02-15 DIAGNOSIS — Z79891 Long term (current) use of opiate analgesic: Secondary | ICD-10-CM

## 2024-02-15 DIAGNOSIS — Z79631 Long term (current) use of antimetabolite agent: Secondary | ICD-10-CM

## 2024-02-15 DIAGNOSIS — Y92239 Unspecified place in hospital as the place of occurrence of the external cause: Secondary | ICD-10-CM | POA: Diagnosis not present

## 2024-02-15 DIAGNOSIS — F17200 Nicotine dependence, unspecified, uncomplicated: Secondary | ICD-10-CM | POA: Diagnosis present

## 2024-02-15 DIAGNOSIS — D849 Immunodeficiency, unspecified: Secondary | ICD-10-CM | POA: Diagnosis present

## 2024-02-15 DIAGNOSIS — N179 Acute kidney failure, unspecified: Secondary | ICD-10-CM | POA: Diagnosis present

## 2024-02-15 DIAGNOSIS — K219 Gastro-esophageal reflux disease without esophagitis: Secondary | ICD-10-CM | POA: Diagnosis present

## 2024-02-15 DIAGNOSIS — K859 Acute pancreatitis without necrosis or infection, unspecified: Secondary | ICD-10-CM | POA: Diagnosis present

## 2024-02-15 DIAGNOSIS — F32A Depression, unspecified: Secondary | ICD-10-CM | POA: Diagnosis present

## 2024-02-15 DIAGNOSIS — R1115 Cyclical vomiting syndrome unrelated to migraine: Secondary | ICD-10-CM | POA: Diagnosis present

## 2024-02-15 DIAGNOSIS — X500XXA Overexertion from strenuous movement or load, initial encounter: Secondary | ICD-10-CM

## 2024-02-15 DIAGNOSIS — Z5982 Transportation insecurity: Secondary | ICD-10-CM

## 2024-02-15 DIAGNOSIS — T50995A Adverse effect of other drugs, medicaments and biological substances, initial encounter: Secondary | ICD-10-CM | POA: Diagnosis present

## 2024-02-15 DIAGNOSIS — B9620 Unspecified Escherichia coli [E. coli] as the cause of diseases classified elsewhere: Secondary | ICD-10-CM | POA: Diagnosis present

## 2024-02-15 DIAGNOSIS — K469 Unspecified abdominal hernia without obstruction or gangrene: Secondary | ICD-10-CM | POA: Diagnosis present

## 2024-02-15 DIAGNOSIS — D84821 Immunodeficiency due to drugs: Secondary | ICD-10-CM | POA: Diagnosis present

## 2024-02-15 DIAGNOSIS — F17210 Nicotine dependence, cigarettes, uncomplicated: Secondary | ICD-10-CM | POA: Diagnosis present

## 2024-02-15 DIAGNOSIS — G609 Hereditary and idiopathic neuropathy, unspecified: Secondary | ICD-10-CM | POA: Diagnosis present

## 2024-02-15 LAB — COMPREHENSIVE METABOLIC PANEL WITH GFR
ALT: 7 U/L (ref 0–44)
AST: 14 U/L — ABNORMAL LOW (ref 15–41)
Albumin: 3.2 g/dL — ABNORMAL LOW (ref 3.5–5.0)
Alkaline Phosphatase: 82 U/L (ref 38–126)
Anion gap: 7 (ref 5–15)
BUN: 24 mg/dL — ABNORMAL HIGH (ref 8–23)
CO2: 22 mmol/L (ref 22–32)
Calcium: 8.5 mg/dL — ABNORMAL LOW (ref 8.9–10.3)
Chloride: 112 mmol/L — ABNORMAL HIGH (ref 98–111)
Creatinine, Ser: 1.53 mg/dL — ABNORMAL HIGH (ref 0.44–1.00)
GFR, Estimated: 37 mL/min — ABNORMAL LOW (ref >60)
Glucose, Bld: 125 mg/dL — ABNORMAL HIGH (ref 70–99)
Potassium: 3.8 mmol/L (ref 3.5–5.1)
Sodium: 141 mmol/L (ref 135–145)
Total Bilirubin: 0.5 mg/dL (ref 0.0–1.2)
Total Protein: 6.7 g/dL (ref 6.5–8.1)

## 2024-02-15 LAB — CBC
HCT: 34.1 % — ABNORMAL LOW (ref 36.0–46.0)
Hemoglobin: 11.3 g/dL — ABNORMAL LOW (ref 12.0–15.0)
MCH: 32 pg (ref 26.0–34.0)
MCHC: 33.1 g/dL (ref 30.0–36.0)
MCV: 96.6 fL (ref 80.0–100.0)
Platelets: 405 K/uL — ABNORMAL HIGH (ref 150–400)
RBC: 3.53 MIL/uL — ABNORMAL LOW (ref 3.87–5.11)
RDW: 14.8 % (ref 11.5–15.5)
WBC: 21.5 K/uL — ABNORMAL HIGH (ref 4.0–10.5)
nRBC: 0 % (ref 0.0–0.2)

## 2024-02-15 LAB — RESP PANEL BY RT-PCR (RSV, FLU A&B, COVID)  RVPGX2
Influenza A by PCR: NEGATIVE
Influenza B by PCR: NEGATIVE
Resp Syncytial Virus by PCR: NEGATIVE
SARS Coronavirus 2 by RT PCR: NEGATIVE

## 2024-02-15 LAB — LIPASE, BLOOD: Lipase: 107 U/L — ABNORMAL HIGH (ref 11–51)

## 2024-02-15 MED ORDER — LACTATED RINGERS IV BOLUS
1000.0000 mL | Freq: Once | INTRAVENOUS | Status: AC
  Administered 2024-02-16: 1000 mL via INTRAVENOUS

## 2024-02-15 MED ORDER — ONDANSETRON HCL 4 MG/2ML IJ SOLN
4.0000 mg | Freq: Once | INTRAMUSCULAR | Status: AC
  Administered 2024-02-15: 4 mg via INTRAVENOUS
  Filled 2024-02-15: qty 2

## 2024-02-15 MED ORDER — IOHEXOL 300 MG/ML  SOLN
75.0000 mL | Freq: Once | INTRAMUSCULAR | Status: AC | PRN
  Administered 2024-02-15: 75 mL via INTRAVENOUS

## 2024-02-15 MED ORDER — HYDROMORPHONE HCL 1 MG/ML IJ SOLN
1.0000 mg | Freq: Once | INTRAMUSCULAR | Status: AC
  Administered 2024-02-15: 1 mg via INTRAVENOUS
  Filled 2024-02-15: qty 1

## 2024-02-15 NOTE — ED Provider Notes (Signed)
 [facility] Provider Note   Event Date/Time   First MD Initiated Contact with Patient 02/15/24 2115     (approximate) History  Nausea, Emesis, and Weakness  HPI Jenna Davis is a 68 y.o. female with past medical history of paroxysmal atrial fibrillation, rheumatoid arthritis, depression, and reflux who presents via EMS from home complaining of nausea/vomiting/diarrhea for the last week.  Patient noted to have subjective fevers as well as left-sided abdominal pain that began after arrival.  EMS gave 4 mg of Zofran  and 500 mg of normal saline. ROS: Patient currently denies any vision changes, tinnitus, difficulty speaking, facial droop, sore throat, chest pain, shortness of breath, dysuria, or weakness/numbness/paresthesias in any extremity   Physical Exam  Triage Vital Signs: ED Triage Vitals  Encounter Vitals Group     BP 02/15/24 2113 134/84     Girls Systolic BP Percentile --      Girls Diastolic BP Percentile --      Boys Systolic BP Percentile --      Boys Diastolic BP Percentile --      Pulse Rate 02/15/24 2107 84     Resp 02/15/24 2107 14     Temp 02/15/24 2107 98.4 F (36.9 C)     Temp Source 02/15/24 2107 Oral     SpO2 02/15/24 2107 100 %     Weight 02/15/24 2104 123 lb (55.8 kg)     Height 02/15/24 2104 5' 6 (1.676 m)     Head Circumference --      Peak Flow --      Pain Score 02/15/24 2112 10     Pain Loc --      Pain Education --      Exclude from Growth Chart --    Most recent vital signs: Vitals:   02/15/24 2107 02/15/24 2113  BP:  134/84  Pulse: 84   Resp: 14   Temp: 98.4 F (36.9 C)   SpO2: 100%    General: Awake, oriented x4. CV:  Good peripheral perfusion. Resp:  Normal effort. Abd:  No distention.  Tender to palpation in the left upper and lower quadrants Other:  Elderly well-developed, well-nourished African-American female in the right lateral decubitus position moaning secondary to pain ED Results / Procedures /  Treatments  Labs (all labs ordered are listed, but only abnormal results are displayed) Labs Reviewed  LIPASE, BLOOD - Abnormal; Notable for the following components:      Result Value   Lipase 107 (*)    All other components within normal limits  COMPREHENSIVE METABOLIC PANEL WITH GFR - Abnormal; Notable for the following components:   Chloride 112 (*)    Glucose, Bld 125 (*)    BUN 24 (*)    Creatinine, Ser 1.53 (*)    Calcium 8.5 (*)    Albumin  3.2 (*)    AST 14 (*)    GFR, Estimated 37 (*)    All other components within normal limits  CBC - Abnormal; Notable for the following components:   WBC 21.5 (*)    RBC 3.53 (*)    Hemoglobin 11.3 (*)    HCT 34.1 (*)    Platelets 405 (*)    All other components within normal limits  RESP PANEL BY RT-PCR (RSV, FLU A&B, COVID)  [id number]  URINALYSIS, ROUTINE W REFLEX MICROSCOPIC   EKG ED ECG REPORT I, Artist MARLA Kerns, the attending physician, personally viewed and interpreted this ECG. Date: 02/15/2024 EKG Time: 2212  Rate: 85 Rhythm: normal sinus rhythm QRS Axis: normal Intervals: normal ST/T Wave abnormalities: normal Narrative Interpretation: no evidence of acute ischemia RADIOLOGY ED MD interpretation:  CT of the abdomen and pelvis shows subtle striated nephrogram appearance of bilateral kidneys which could reflect pyelonephritis - All radiology independently interpreted and agree with radiology assessment Official radiology report(s): CT ABDOMEN PELVIS W CONTRAST Result Date: 02/15/2024 CLINICAL DATA:  Nausea/vomiting/diarrhea for 1 week EXAM: CT ABDOMEN AND PELVIS WITH CONTRAST TECHNIQUE: Multidetector CT imaging of the abdomen and pelvis was performed using the standard protocol following bolus administration of intravenous contrast. RADIATION DOSE REDUCTION: This exam was performed according to the departmental dose-optimization program which includes automated exposure control, adjustment of the mA and/or kV according to  patient size and/or use of iterative reconstruction technique. CONTRAST:  75mL OMNIPAQUE IOHEXOL 300 MG/ML  SOLN COMPARISON:  10/16/2020 FINDINGS: Lower chest: Dependent hypoventilatory changes. No acute pleural or parenchymal lung disease. Moderate hiatal hernia. Hepatobiliary: Gallbladder is markedly distended, with no evidence of gallbladder wall thickening, pericholecystic fluid, or gallstones. Liver is unremarkable. No biliary duct dilation. Pancreas: Unremarkable. No pancreatic ductal dilatation or surrounding inflammatory changes. Spleen: Normal in size without focal abnormality. Adrenals/Urinary Tract: Adrenals are stable. No urinary tract calculi or obstructive uropathy within either kidney. Subtle striated nephrogram appearance of the bilateral kidneys on delayed imaging, right greater than left, which may reflect pyelonephritis. Please correlate with urinalysis. The bladder is unremarkable. Stomach/Bowel: No bowel obstruction or ileus. The appendix, if still present, is not well visualized. No bowel wall thickening or inflammatory change. Vascular/Lymphatic: Aortic atherosclerosis. No enlarged abdominal or pelvic lymph nodes. Reproductive: Uterus and bilateral adnexa are unremarkable. Other: No free fluid or free intraperitoneal gas. No abdominal wall hernia. Musculoskeletal: No acute or destructive bony abnormalities. Postsurgical changes at the L4-5 level. Reconstructed images demonstrate no additional findings. IMPRESSION: 1. Subtle striated nephrogram appearance of the bilateral kidneys, which could reflect pyelonephritis. Please correlate with urinalysis. 2. Marked gallbladder distension, with no evidence of cholelithiasis or cholecystitis. 3. Moderate hiatal hernia. 4.  Aortic Atherosclerosis (ICD10-I70.0). Electronically Signed   By: Ozell Daring M.D.   On: 02/15/2024 23:02   PROCEDURES: Critical Care performed: No Procedures MEDICATIONS ORDERED IN ED: Medications  HYDROmorphone  (DILAUDID )  injection 1 mg (1 mg Intravenous Given 02/15/24 2151)  ondansetron  (ZOFRAN ) injection 4 mg (4 mg Intravenous Given 02/15/24 2151)  iohexol (OMNIPAQUE) 300 MG/ML solution 75 mL (75 mLs Intravenous Contrast Given 02/15/24 2242)   IMPRESSION / MDM / ASSESSMENT AND PLAN / ED COURSE  I reviewed the triage vital signs and the nursing notes.                             The patient is on the cardiac monitor to evaluate for evidence of arrhythmia and/or significant heart rate changes. Patient's presentation is most consistent with acute presentation with potential threat to life or bodily function. Patient is a 68 year old female with the above-stated past medical history presents for 1 week of nausea/vomiting/diarrhea with associated left-sided abdominal pain. DDx: Diverticulitis, pyelonephritis, urinary tract infection, gastroenteritis, colitis Plan: CBC, CMP, UA, lipase, RVP, CT of the abdomen/pelvis  Care of this patient will be signed out to the oncoming physician at the end of my shift.  All pertinent patient information conveyed and all questions answered.  All further care and disposition decisions will be made by the oncoming physician.   FINAL CLINICAL IMPRESSION(S) / ED DIAGNOSES   Final  diagnoses:  Nausea vomiting and diarrhea   Rx / DC Orders   ED Discharge Orders     None      Note:  This document was prepared using Dragon voice recognition software and may include unintentional dictation errors.   Jossie Artist POUR, MD 02/15/24 [phone]

## 2024-02-15 NOTE — ED Triage Notes (Signed)
 Patient brought in by EMS from home with c/o N/V/D x 1 week. EMS stated Temp 99.0. Zofran  4mg  and Of NS given by EMS.

## 2024-02-16 ENCOUNTER — Encounter: Payer: Self-pay | Admitting: Internal Medicine

## 2024-02-16 DIAGNOSIS — N10 Acute tubulo-interstitial nephritis: Secondary | ICD-10-CM | POA: Diagnosis present

## 2024-02-16 DIAGNOSIS — K219 Gastro-esophageal reflux disease without esophagitis: Secondary | ICD-10-CM | POA: Diagnosis present

## 2024-02-16 DIAGNOSIS — K469 Unspecified abdominal hernia without obstruction or gangrene: Secondary | ICD-10-CM | POA: Diagnosis present

## 2024-02-16 DIAGNOSIS — I480 Paroxysmal atrial fibrillation: Secondary | ICD-10-CM | POA: Diagnosis present

## 2024-02-16 DIAGNOSIS — G8929 Other chronic pain: Secondary | ICD-10-CM | POA: Diagnosis present

## 2024-02-16 DIAGNOSIS — S43014A Anterior dislocation of right humerus, initial encounter: Secondary | ICD-10-CM | POA: Diagnosis not present

## 2024-02-16 DIAGNOSIS — M545 Low back pain, unspecified: Secondary | ICD-10-CM | POA: Diagnosis present

## 2024-02-16 DIAGNOSIS — B9620 Unspecified Escherichia coli [E. coli] as the cause of diseases classified elsewhere: Secondary | ICD-10-CM | POA: Diagnosis present

## 2024-02-16 DIAGNOSIS — Z79631 Long term (current) use of antimetabolite agent: Secondary | ICD-10-CM | POA: Diagnosis not present

## 2024-02-16 DIAGNOSIS — M069 Rheumatoid arthritis, unspecified: Secondary | ICD-10-CM | POA: Diagnosis present

## 2024-02-16 DIAGNOSIS — Z803 Family history of malignant neoplasm of breast: Secondary | ICD-10-CM | POA: Diagnosis not present

## 2024-02-16 DIAGNOSIS — Y92239 Unspecified place in hospital as the place of occurrence of the external cause: Secondary | ICD-10-CM | POA: Diagnosis not present

## 2024-02-16 DIAGNOSIS — N179 Acute kidney failure, unspecified: Secondary | ICD-10-CM | POA: Diagnosis present

## 2024-02-16 DIAGNOSIS — Z981 Arthrodesis status: Secondary | ICD-10-CM | POA: Diagnosis not present

## 2024-02-16 DIAGNOSIS — Z5982 Transportation insecurity: Secondary | ICD-10-CM | POA: Diagnosis not present

## 2024-02-16 DIAGNOSIS — D84821 Immunodeficiency due to drugs: Secondary | ICD-10-CM | POA: Diagnosis present

## 2024-02-16 DIAGNOSIS — G629 Polyneuropathy, unspecified: Secondary | ICD-10-CM

## 2024-02-16 DIAGNOSIS — Y9389 Activity, other specified: Secondary | ICD-10-CM | POA: Diagnosis not present

## 2024-02-16 DIAGNOSIS — X500XXA Overexertion from strenuous movement or load, initial encounter: Secondary | ICD-10-CM | POA: Diagnosis not present

## 2024-02-16 DIAGNOSIS — S43036A Inferior dislocation of unspecified humerus, initial encounter: Secondary | ICD-10-CM | POA: Diagnosis not present

## 2024-02-16 DIAGNOSIS — R1115 Cyclical vomiting syndrome unrelated to migraine: Secondary | ICD-10-CM

## 2024-02-16 DIAGNOSIS — Z79891 Long term (current) use of opiate analgesic: Secondary | ICD-10-CM | POA: Diagnosis not present

## 2024-02-16 DIAGNOSIS — F17210 Nicotine dependence, cigarettes, uncomplicated: Secondary | ICD-10-CM | POA: Diagnosis present

## 2024-02-16 DIAGNOSIS — Z833 Family history of diabetes mellitus: Secondary | ICD-10-CM | POA: Diagnosis not present

## 2024-02-16 DIAGNOSIS — E860 Dehydration: Secondary | ICD-10-CM | POA: Diagnosis present

## 2024-02-16 DIAGNOSIS — G609 Hereditary and idiopathic neuropathy, unspecified: Secondary | ICD-10-CM | POA: Diagnosis present

## 2024-02-16 DIAGNOSIS — K5900 Constipation, unspecified: Secondary | ICD-10-CM | POA: Diagnosis present

## 2024-02-16 DIAGNOSIS — K859 Acute pancreatitis without necrosis or infection, unspecified: Secondary | ICD-10-CM | POA: Diagnosis present

## 2024-02-16 DIAGNOSIS — F32A Depression, unspecified: Secondary | ICD-10-CM | POA: Diagnosis present

## 2024-02-16 DIAGNOSIS — R112 Nausea with vomiting, unspecified: Secondary | ICD-10-CM | POA: Diagnosis present

## 2024-02-16 LAB — BASIC METABOLIC PANEL WITH GFR
Anion gap: 15 (ref 5–15)
BUN: 22 mg/dL (ref 8–23)
CO2: 19 mmol/L — ABNORMAL LOW (ref 22–32)
Calcium: 8.9 mg/dL (ref 8.9–10.3)
Chloride: 110 mmol/L (ref 98–111)
Creatinine, Ser: 1.45 mg/dL — ABNORMAL HIGH (ref 0.44–1.00)
GFR, Estimated: 40 mL/min — ABNORMAL LOW (ref >60)
Glucose, Bld: 108 mg/dL — ABNORMAL HIGH (ref 70–99)
Potassium: 4 mmol/L (ref 3.5–5.1)
Sodium: 144 mmol/L (ref 135–145)

## 2024-02-16 LAB — URINALYSIS, ROUTINE W REFLEX MICROSCOPIC
Bilirubin Urine: NEGATIVE
Glucose, UA: NEGATIVE mg/dL
Ketones, ur: NEGATIVE mg/dL
Nitrite: POSITIVE — AB
Protein, ur: 100 mg/dL — AB
Specific Gravity, Urine: 1.02 (ref 1.005–1.030)
Squamous Epithelial / HPF: 0 /HPF (ref 0–5)
WBC, UA: >50 WBC/hpf (ref 0–5)
pH: 5 (ref 5.0–8.0)

## 2024-02-16 LAB — CBC
HCT: 40.1 % (ref 36.0–46.0)
Hemoglobin: 13 g/dL (ref 12.0–15.0)
MCH: 31.9 pg (ref 26.0–34.0)
MCHC: 32.4 g/dL (ref 30.0–36.0)
MCV: 98.3 fL (ref 80.0–100.0)
Platelets: 406 K/uL — ABNORMAL HIGH (ref 150–400)
RBC: 4.08 MIL/uL (ref 3.87–5.11)
RDW: 15.1 % (ref 11.5–15.5)
WBC: 18.9 K/uL — ABNORMAL HIGH (ref 4.0–10.5)
nRBC: 0 % (ref 0.0–0.2)

## 2024-02-16 LAB — HIV ANTIBODY (ROUTINE TESTING W REFLEX): HIV Screen 4th Generation wRfx: NONREACTIVE

## 2024-02-16 MED ORDER — SODIUM CHLORIDE 0.9 % IV SOLN
1.0000 g | INTRAVENOUS | Status: DC
  Administered 2024-02-17 – 2024-02-19 (×3): 1 g via INTRAVENOUS
  Filled 2024-02-16 (×3): qty 10

## 2024-02-16 MED ORDER — NICOTINE 21 MG/24HR TD PT24
21.0000 mg | MEDICATED_PATCH | Freq: Every day | TRANSDERMAL | Status: DC
  Administered 2024-02-16 – 2024-02-19 (×4): 21 mg via TRANSDERMAL
  Filled 2024-02-16 (×4): qty 1

## 2024-02-16 MED ORDER — MELATONIN 5 MG PO TABS
5.0000 mg | ORAL_TABLET | Freq: Every evening | ORAL | Status: DC | PRN
  Administered 2024-02-16: 5 mg via ORAL
  Filled 2024-02-16: qty 1

## 2024-02-16 MED ORDER — BUPROPION HCL ER (XL) 150 MG PO TB24
150.0000 mg | ORAL_TABLET | Freq: Every morning | ORAL | Status: DC
  Administered 2024-02-17 – 2024-02-19 (×3): 150 mg via ORAL
  Filled 2024-02-16 (×3): qty 1

## 2024-02-16 MED ORDER — ACETAMINOPHEN 650 MG RE SUPP: 650.0000 mg | Freq: Four times a day (QID) | RECTAL | Status: DC | PRN

## 2024-02-16 MED ORDER — LIDOCAINE 5 % EX PTCH
2.0000 | MEDICATED_PATCH | CUTANEOUS | Status: DC
Start: 2024-02-16 — End: 2024-02-19
  Administered 2024-02-16 – 2024-02-18 (×3): 2 via TRANSDERMAL
  Filled 2024-02-16 (×3): qty 2

## 2024-02-16 MED ORDER — LACTATED RINGERS IV SOLN: INTRAVENOUS | Status: DC

## 2024-02-16 MED ORDER — OXYCODONE HCL 5 MG PO TABS
5.0000 mg | ORAL_TABLET | ORAL | Status: DC | PRN
  Administered 2024-02-16: 10 mg via ORAL
  Filled 2024-02-16: qty 2

## 2024-02-16 MED ORDER — HYDROMORPHONE HCL 1 MG/ML IJ SOLN
1.0000 mg | Freq: Once | INTRAMUSCULAR | Status: AC
  Administered 2024-02-16: 1 mg via INTRAVENOUS
  Filled 2024-02-16: qty 1

## 2024-02-16 MED ORDER — HALOPERIDOL 0.5 MG PO TABS
0.5000 mg | ORAL_TABLET | Freq: Every morning | ORAL | Status: DC
  Administered 2024-02-17 – 2024-02-19 (×3): 0.5 mg via ORAL
  Filled 2024-02-16 (×3): qty 1

## 2024-02-16 MED ORDER — ONDANSETRON HCL 4 MG/2ML IJ SOLN
4.0000 mg | Freq: Four times a day (QID) | INTRAMUSCULAR | Status: DC | PRN
  Administered 2024-02-17 – 2024-02-18 (×2): 4 mg via INTRAVENOUS
  Filled 2024-02-16 (×2): qty 2

## 2024-02-16 MED ORDER — ACETAMINOPHEN 325 MG PO TABS
650.0000 mg | ORAL_TABLET | Freq: Four times a day (QID) | ORAL | Status: DC | PRN
  Administered 2024-02-18: 650 mg via ORAL
  Filled 2024-02-16: qty 2

## 2024-02-16 MED ORDER — SODIUM CHLORIDE 0.9 % IV SOLN
1.0000 g | INTRAVENOUS | Status: AC
  Administered 2024-02-16: 1 g via INTRAVENOUS
  Filled 2024-02-16: qty 10

## 2024-02-16 MED ORDER — KETOROLAC TROMETHAMINE 15 MG/ML IJ SOLN
15.0000 mg | Freq: Four times a day (QID) | INTRAMUSCULAR | Status: DC | PRN
  Administered 2024-02-16: 15 mg via INTRAVENOUS
  Filled 2024-02-16: qty 1

## 2024-02-16 MED ORDER — OXYCODONE HCL 5 MG PO TABS
5.0000 mg | ORAL_TABLET | ORAL | Status: DC | PRN
  Administered 2024-02-16 (×2): 5 mg via ORAL
  Filled 2024-02-16 (×2): qty 1

## 2024-02-16 MED ORDER — ENOXAPARIN SODIUM 40 MG/0.4ML IJ SOSY
40.0000 mg | PREFILLED_SYRINGE | INTRAMUSCULAR | Status: DC
  Administered 2024-02-16 – 2024-02-19 (×4): 40 mg via SUBCUTANEOUS
  Filled 2024-02-16 (×4): qty 0.4

## 2024-02-16 MED ORDER — ONDANSETRON HCL 4 MG PO TABS: 4.0000 mg | ORAL_TABLET | Freq: Four times a day (QID) | ORAL | Status: DC | PRN

## 2024-02-16 MED ORDER — LACTATED RINGERS IV SOLN: INTRAVENOUS | Status: AC

## 2024-02-16 MED ORDER — MORPHINE SULFATE 15 MG PO TABS
15.0000 mg | ORAL_TABLET | ORAL | Status: DC | PRN
  Administered 2024-02-16 – 2024-02-18 (×9): 15 mg via ORAL
  Filled 2024-02-16 (×10): qty 1

## 2024-02-16 NOTE — ED Provider Notes (Signed)
 Procedures     ----------------------------------------- 2:10 AM on 02/16/2024 ----------------------------------------- CT concerning for pyelonephritis. UA confirms UTI. Pt still feeling very ill despite ivf, dilaudid , zofran . requiring additional IV analgesia. Case d/w hospitalist for further management. Iv rocephin given, u cx. Added.      Viviann Pastor, MD 02/16/24 [phone]

## 2024-02-16 NOTE — Assessment & Plan Note (Signed)
 Rocephin, pain control Follow cultures

## 2024-02-16 NOTE — Progress Notes (Signed)
  PROGRESS NOTE    Jenna Davis  FMW:[id number] DOB: July 18, 1955 DOA: 02/15/2024 PCP: Jenna Batter A, PA  [id number]  LOS: 0 days   Brief hospital course:   Assessment & Plan: Jenna Davis is Davis 68 y.o. female with medical history significant for  functional abdominal pain, , rheumatoid arthritis on methotrexate, chronic low back pain, idiopathic neuropathy on chronic opiates, cyclical vomiting syndrome recently started on oral Haldol  and dicyclomine  by PCP on 9/16 with recommendation to discontinue marijuana use, who presented with back pain and intractable left-sided abdominal pain associated with nausea and vomiting    * Acute pyelonephritis --CT Davis/p showed Subtle striated nephrogram appearance of the bilateral kidneys on delayed imaging, right greater than left, which may reflect pyelonephritis. --cont ceftriaxone pending urine cx  Possible pancreatitis --lipase 107. --cont MIVF  AKI (acute kidney injury) --cont MIVF  Cyclical vomiting syndrome Recently placed on Haldol  and dicyclomine  --resume home haldol   RA (rheumatoid arthritis) (HCC) Chronic immunosuppressive therapy-methotrexate Hold methotrexate for now No evidence of acute flare  Chronic back pain Chronic neuropathic pain On chronic opiates --has current Rx for tramadol and gabapentin  --cont pain med as oral morphine  for now  Tobacco use disorder --nicotine patch   DVT prophylaxis: Lovenox SQ Code Status: Full code  Family Communication:  Level of care: Med-Surg Dispo:   The patient is from: home Anticipated d/c is to: home Anticipated d/c date is: 2-3 days   Subjective and Interval History:  Pt complained of pain that was palpable over left lower anterior ribs.  N/V improved.   Objective: Vitals:   02/16/24 0321 02/16/24 0442 02/16/24 0834 02/16/24 1508  BP:  (!) 157/66 131/78 131/77  Pulse:  89 73 74  Resp:  18 19 17   Temp: 98.2 F (36.8 C) 97.6 F (36.4 C) 98.2 F (36.8 C)  98.2 F (36.8 C)  TempSrc: Oral  Oral   SpO2:  100% 98% 100%  Weight:  54.3 kg    Height:  5' 6.5 (1.689 m)      Intake/Output Summary (Last 24 hours) at 02/16/2024 1812 Last data filed at 02/16/2024 1543 Gross per 24 hour  Intake 2469.11 ml  Output --  Net 2469.11 ml   Filed Weights   02/15/24 2104 02/16/24 0442  Weight: 55.8 kg 54.3 kg    Examination:   Constitutional: NAD, AAOx3 HEENT: conjunctivae and lids normal, EOMI CV: No cyanosis.   RESP: normal respiratory effort, on RA Neuro: II - XII grossly intact.     Data Reviewed: I have personally reviewed labs and imaging studies  No charge note.  Ellouise Haber, MD [facility] If 7PM-7AM, please contact night-coverage 02/16/2024, 6:12 PM

## 2024-02-16 NOTE — Hospital Course (Addendum)
 [name]

## 2024-02-16 NOTE — Assessment & Plan Note (Signed)
 Nicotine  patch

## 2024-02-16 NOTE — Plan of Care (Signed)
   Problem: Education: Goal: Knowledge of General Education information will improve Description Including pain rating scale, medication(s)/side effects and non-pharmacologic comfort measures Outcome: Progressing

## 2024-02-16 NOTE — Assessment & Plan Note (Signed)
 Chronic immunosuppressive therapy-methotrexate Hold methotrexate for now No evidence of acute flare

## 2024-02-16 NOTE — Assessment & Plan Note (Signed)
 Recently placed on Haldol  and dicyclomine  Will hold until tolerating orally

## 2024-02-16 NOTE — Assessment & Plan Note (Signed)
 Likely ATN secondary to dehydration from vomiting IV fluid Monitor renal function and avoid nephrotoxins

## 2024-02-16 NOTE — Assessment & Plan Note (Addendum)
 Chronic neuropathic pain On chronic opiates Resume home meds pending med rec IV pain meds if not tolerating

## 2024-02-16 NOTE — H&P (Signed)
 History and Physical    Patient: Jenna Davis FMW:[id number] DOB: Dec 08, 1955 DOA: 02/15/2024 DOS: the patient was seen and examined on 02/16/2024 PCP: Rod Grant LABOR, PA  Patient coming from: Home  Chief Complaint:  Chief Complaint  Patient presents with   Nausea   Emesis   Weakness    HPI: Jenna Davis is a 68 y.o. female with medical history significant for  functional abdominal pain, , rheumatoid arthritis on methotrexate, chronic low back pain, idiopathic neuropathy on chronic opiates, cyclical vomiting syndrome recently started on oral Haldol  and dicyclomine  by PCP on 9/16 with recommendation to discontinue marijuana use, who is  being admitted with acute pyelonephritis.  She presented with back pain and intractable left-sided abdominal pain associated with nausea and vomiting, burning on urination. she reports subjective fevers.  Denies chest pain shortness of breath. In the ED, vitals within normal limits Labs notable for WBC 21,000.  Lipase of 107 with normal LFTs.  Hemoglobin 11.3 (baseline 13.4 11/2023).  Urinalysis with positive nitrite small leuks and rare bacteria.Creatinine 1.53 up from baseline of 0.87 in July 2025.  Electrolytes mostly unremarkable Respiratory viral panel negative for COVID flu and RSV EKG showed NSR at 85 CT abdomen and pelvis suggested findings consistent with acute pyelonephritis.     Review of Systems: As mentioned in the history of present illness. All other systems reviewed and are negative.  Past Medical History:  Diagnosis Date   A-fib [facility])    Depression    GERD (gastroesophageal reflux disease)    RA (rheumatoid arthritis) (HCC)    Rheumatoid arthritis (HCC)    Past Surgical History:  Procedure Laterality Date   ABDOMINAL EXPOSURE N/A 10/11/2020   Procedure: ABDOMINAL EXPOSURE;  Surgeon: Gretta Lonni PARAS, MD;  Location: [facility] OR;  Service: Vascular;  Laterality: N/A;   ANTERIOR LUMBAR FUSION N/A 10/11/2020   Procedure: ANTERIOR  LUMBAR INTERBODY FUSION LUMBAR 4 - LUMBAR 5 WITH POSTERIOR SPINAL FUSION WITH INSTRUMENTATION AND ALLOGRAFT;  Surgeon: Beuford Anes, MD;  Location: [facility] OR;  Service: Orthopedics;  Laterality: N/A;   ATRIAL FIBRILLATION ABLATION     OVARIAN CYST REMOVAL     Social History:  reports that she has been smoking. She has never used smokeless tobacco. She reports that she does not drink alcohol and does not use drugs.  No Known Allergies  Family History  Problem Relation Age of Onset   Diabetes Mother    Breast cancer Mother    Diabetes Father     Prior to Admission medications   Medication Sig Start Date End Date Taking? Authorizing Provider  acetaminophen  (TYLENOL ) 325 MG tablet Take 2 tablets (650 mg total) by mouth every 6 (six) hours as needed for mild pain (or Fever >/= 101). 01/27/16   Sherial Bail, MD  buPROPion  (WELLBUTRIN  XL) 150 MG 24 hr tablet Take 150 mg by mouth every morning. 08/25/20   [provider]  Cholecalciferol  (D3 PO) Take 1 capsule by mouth daily.    [provider]  cyclobenzaprine  (FLEXERIL ) 5 MG tablet Take 5-10 mg by mouth at bedtime as needed for pain. 09/06/20   [provider]  dicyclomine  (BENTYL ) 20 MG tablet Take 20 mg by mouth 4 (four) times daily as needed for other. Stomach cramps 09/11/20   [provider]  folic acid  (FOLVITE ) 1 MG tablet Take 1 mg by mouth daily. 02/03/15   [provider]  gabapentin  (NEURONTIN ) 300 MG capsule Take 900 mg by mouth 3 (three) times daily. 04/06/18  [provider]  haloperidol  (HALDOL ) 0.5 MG tablet Take 0.5 mg by mouth every morning. 09/19/20   [provider]  hydrOXYzine  (ATARAX /VISTARIL ) 10 MG tablet Take 10 mg by mouth 3 (three) times daily as needed for itching. 09/11/20   [provider]  methocarbamol  (ROBAXIN ) 500 MG tablet Take 1 tablet (500 mg total) by mouth every 6 (six) hours as needed for muscle spasms. 10/12/20   Dumonski, Oneil, MD  OTREXUP  25 MG/0.4ML SOAJ Inject 25 mg into the skin once a week. 07/27/20   [provider]  oxyCODONE -acetaminophen  (PERCOCET/ROXICET) 5-325 MG tablet Take 1-2 tablets by mouth every 4 (four) hours as needed for severe pain. 10/12/20   Beuford Oneil, MD  polyethylene glycol (MIRALAX  / GLYCOLAX ) 17 g packet Take 17 g by mouth 3 (three) times daily. 10/16/20   Lorriane Holmes, MD  traMADol (ULTRAM) 50 MG tablet Take 50 mg by mouth every 8 (eight) hours as needed for pain. 09/29/20   [provider]    Physical Exam: Vitals:   02/15/24 2104 02/15/24 2107 02/15/24 2113 02/16/24 0130  BP:   134/84 122/74  Pulse:  84  80  Resp:  14    Temp:  98.4 F (36.9 C)    TempSrc:  Oral    SpO2:  100%  96%  Weight: 55.8 kg     Height: 5' 6 (1.676 m)      Physical Exam Vitals and nursing note reviewed.  Constitutional:      General: She is not in acute distress.    Comments: Frail appearing female  HENT:     Head: Normocephalic and atraumatic.  Cardiovascular:     Rate and Rhythm: Normal rate and regular rhythm.     Heart sounds: Normal heart sounds.  Pulmonary:     Effort: Pulmonary effort is normal.     Breath sounds: Normal breath sounds.  Abdominal:     Palpations: Abdomen is soft.     Tenderness: There is no abdominal tenderness.  Neurological:     Mental Status: Mental status is at baseline.     Labs on Admission: I have personally reviewed following labs and imaging studies  CBC: Recent Labs  Lab 02/15/24 2114  WBC 21.5*  HGB 11.3*  HCT 34.1*  MCV 96.6  PLT 405*   Basic Metabolic Panel: Recent Labs  Lab 02/15/24 2114  NA 141  K 3.8  CL 112*  CO2 22  GLUCOSE 125*  BUN 24*  CREATININE 1.53*  CALCIUM 8.5*   GFR: Estimated Creatinine Clearance: 31.9 mL/min (A) (by C-G formula based on SCr of 1.53 mg/dL (H)). Liver Function Tests: Recent Labs  Lab 02/15/24 2114  AST 14*  ALT 7  ALKPHOS 82  BILITOT 0.5  PROT 6.7  ALBUMIN  3.2*   Recent Labs  Lab  02/15/24 2114  LIPASE 107*   No results for input(s): AMMONIA in the last 168 hours. Coagulation Profile: No results for input(s): INR, PROTIME in the last 168 hours. Cardiac Enzymes: No results for input(s): CKTOTAL, CKMB, CKMBINDEX, TROPONINI in the last 168 hours. BNP (last 3 results) No results for input(s): PROBNP in the last 8760 hours. HbA1C: No results for input(s): HGBA1C in the last 72 hours. CBG: No results for input(s): GLUCAP in the last 168 hours. Lipid Profile: No results for input(s): CHOL, HDL, LDLCALC, TRIG, CHOLHDL, LDLDIRECT in the last 72 hours. Thyroid Function Tests: No results for input(s): TSH, T4TOTAL, FREET4, T3FREE, THYROIDAB in the last 72  hours. Anemia Panel: No results for input(s): VITAMINB12, FOLATE, FERRITIN, TIBC, IRON, RETICCTPCT in the last 72 hours. Urine analysis:    Component Value Date/Time   COLORURINE YELLOW (A) 02/16/2024 0020   APPEARANCEUR HAZY (A) 02/16/2024 0020   LABSPEC 1.020 02/16/2024 0020   PHURINE 5.0 02/16/2024 0020   GLUCOSEU NEGATIVE 02/16/2024 0020   HGBUR SMALL (A) 02/16/2024 0020   BILIRUBINUR NEGATIVE 02/16/2024 0020   KETONESUR NEGATIVE 02/16/2024 0020   PROTEINUR 100 (A) 02/16/2024 0020   NITRITE POSITIVE (A) 02/16/2024 0020   LEUKOCYTESUR SMALL (A) 02/16/2024 0020    Radiological Exams on Admission: CT ABDOMEN PELVIS W CONTRAST Result Date: 02/15/2024 CLINICAL DATA:  Nausea/vomiting/diarrhea for 1 week EXAM: CT ABDOMEN AND PELVIS WITH CONTRAST TECHNIQUE: Multidetector CT imaging of the abdomen and pelvis was performed using the standard protocol following bolus administration of intravenous contrast. RADIATION DOSE REDUCTION: This exam was performed according to the departmental dose-optimization program which includes automated exposure control, adjustment of the mA and/or kV according to patient size and/or use of iterative reconstruction technique. CONTRAST:   75mL OMNIPAQUE IOHEXOL 300 MG/ML  SOLN COMPARISON:  10/16/2020 FINDINGS: Lower chest: Dependent hypoventilatory changes. No acute pleural or parenchymal lung disease. Moderate hiatal hernia. Hepatobiliary: Gallbladder is markedly distended, with no evidence of gallbladder wall thickening, pericholecystic fluid, or gallstones. Liver is unremarkable. No biliary duct dilation. Pancreas: Unremarkable. No pancreatic ductal dilatation or surrounding inflammatory changes. Spleen: Normal in size without focal abnormality. Adrenals/Urinary Tract: Adrenals are stable. No urinary tract calculi or obstructive uropathy within either kidney. Subtle striated nephrogram appearance of the bilateral kidneys on delayed imaging, right greater than left, which may reflect pyelonephritis. Please correlate with urinalysis. The bladder is unremarkable. Stomach/Bowel: No bowel obstruction or ileus. The appendix, if still present, is not well visualized. No bowel wall thickening or inflammatory change. Vascular/Lymphatic: Aortic atherosclerosis. No enlarged abdominal or pelvic lymph nodes. Reproductive: Uterus and bilateral adnexa are unremarkable. Other: No free fluid or free intraperitoneal gas. No abdominal wall hernia. Musculoskeletal: No acute or destructive bony abnormalities. Postsurgical changes at the L4-5 level. Reconstructed images demonstrate no additional findings. IMPRESSION: 1. Subtle striated nephrogram appearance of the bilateral kidneys, which could reflect pyelonephritis. Please correlate with urinalysis. 2. Marked gallbladder distension, with no evidence of cholelithiasis or cholecystitis. 3. Moderate hiatal hernia. 4.  Aortic Atherosclerosis (ICD10-I70.0). Electronically Signed   By: Ozell Daring M.D.   On: 02/15/2024 23:02   Data Reviewed for HPI: Relevant notes from primary care and specialist visits, past discharge summaries as available in EHR, including Care Everywhere. Prior diagnostic testing as pertinent to  current admission diagnoses Updated medications and problem lists for reconciliation ED course, including vitals, labs, imaging, treatment and response to treatment Triage notes, nursing and pharmacy notes and ED provider's notes Notable results as noted above in HPI      Assessment and Plan: * Acute pyelonephritis Rocephin, pain control Follow cultures  AKI (acute kidney injury) Likely ATN secondary to dehydration from vomiting IV fluid Monitor renal function and avoid nephrotoxins  Cyclical vomiting syndrome Recently placed on Haldol  and dicyclomine  Will hold until tolerating orally  RA (rheumatoid arthritis) (HCC) Chronic immunosuppressive therapy-methotrexate Hold methotrexate for now No evidence of acute flare  Chronic back pain Chronic neuropathic pain On chronic opiates Resume home meds pending med rec IV pain meds if not tolerating  Tobacco use disorder Nicotine patch      DVT prophylaxis: Lovenox  Consults: none  Advance Care Planning:   Code Status: Prior  Family Communication: none  Disposition Plan: Back to previous home environment  Severity of Illness: The appropriate patient status for this patient is OBSERVATION. Observation status is judged to be reasonable and necessary in order to provide the required intensity of service to ensure the patient's safety. The patient's presenting symptoms, physical exam findings, and initial radiographic and laboratory data in the context of their medical condition is felt to place them at decreased risk for further clinical deterioration. Furthermore, it is anticipated that the patient will be medically stable for discharge from the hospital within 2 midnights of admission.   Author: Delayne LULLA Solian, MD 02/16/2024 2:23 AM  For on call review [email or web address].

## 2024-02-16 NOTE — Progress Notes (Signed)
 Patient refuses bed alarm per NT.

## 2024-02-16 NOTE — ED Notes (Signed)
 ED TO INPATIENT HANDOFF REPORT  ED Nurse Name and Phone #: [phone]  S Name/Age/Gender Jenna Davis 68 y.o. female Room/Bed: ED05A/ED05A  Code Status   Code Status: Full Code  Home/SNF/Other Home Patient oriented to: self, place, time, and situation Is this baseline? Yes   Triage Complete: Triage complete  Chief Complaint Acute pyelonephritis [N10]  Triage Note Patient brought in by EMS from home with c/o N/V/D x 1 week. EMS stated Temp 99.0. Zofran  4mg  and Of NS given by EMS.    Allergies No Known Allergies  Level of Care/Admitting Diagnosis ED Disposition     ED Disposition  Admit   Condition  --   Comment  Hospital Area: [facility] [[id number]]  Level of Care: Med-Surg [16]  Covid Evaluation: Asymptomatic - no recent exposure (last 10 days) testing not required  Diagnosis: Acute pyelonephritis [258431]  Admitting Physician: CLEATUS DELAYNE GAILS [[id number]]  Attending Physician: CLEATUS DELAYNE GAILS [[id number]]  For patients discharging to extended facilities (i.e. SNF, AL, group homes or LTAC) initiate:: Discharge to SNF/Facility Placement COVID-19 Lab Testing Protocol          B Medical/Surgery History Past Medical History:  Diagnosis Date   A-fib (HCC)    Depression    GERD (gastroesophageal reflux disease)    RA (rheumatoid arthritis) (HCC)    Rheumatoid arthritis (HCC)    Past Surgical History:  Procedure Laterality Date   ABDOMINAL EXPOSURE N/A 10/11/2020   Procedure: ABDOMINAL EXPOSURE;  Surgeon: Gretta Lonni PARAS, MD;  Location: [facility] OR;  Service: Vascular;  Laterality: N/A;   ANTERIOR LUMBAR FUSION N/A 10/11/2020   Procedure: ANTERIOR LUMBAR INTERBODY FUSION LUMBAR 4 - LUMBAR 5 WITH POSTERIOR SPINAL FUSION WITH INSTRUMENTATION AND ALLOGRAFT;  Surgeon: Beuford Anes, MD;  Location: [facility] OR;  Service: Orthopedics;  Laterality: N/A;   ATRIAL FIBRILLATION ABLATION     OVARIAN CYST REMOVAL       A IV Location/Drains/Wounds Patient  Lines/Drains/Airways Status     Active Line/Drains/Airways     Name Placement date Placement time Site Days   Peripheral IV 02/16/24 20 G Anterior;Left Forearm 02/16/24  0321  Forearm  less than 1   Incision (Closed) 10/11/20 Abdomen 10/11/20  1209  -- 1223   Incision (Closed) 10/11/20 Back Lower 10/11/20  --  -- 1223            Intake/Output Last 24 hours  Intake/Output Summary (Last 24 hours) at 02/16/2024 0417 Last data filed at 02/16/2024 0320 Gross per 24 hour  Intake 1100 ml  Output --  Net 1100 ml    Labs/Imaging Results for orders placed or performed during the hospital encounter of 02/15/24 (from the past 48 hours)  Lipase, blood     Status: Abnormal   Collection Time: 02/15/24  9:14 PM  Result Value Ref Range   Lipase 107 (H) 11 - 51 U/L    Comment: Performed at [facility], [address], [place], [place] [address]  Comprehensive metabolic panel     Status: Abnormal   Collection Time: 02/15/24  9:14 PM  Result Value Ref Range   Sodium 141 135 - 145 mmol/L   Potassium 3.8 3.5 - 5.1 mmol/L   Chloride 112 (H) 98 - 111 mmol/L   CO2 22 22 - 32 mmol/L   Glucose, Bld 125 (H) 70 - 99 mg/dL    Comment: Glucose reference range applies only to samples taken after fasting for at least 8 hours.   BUN  24 (H) 8 - 23 mg/dL   Creatinine, Ser 8.46 (H) 0.44 - 1.00 mg/dL   Calcium 8.5 (L) 8.9 - 10.3 mg/dL   Total Protein 6.7 6.5 - 8.1 g/dL   Albumin  3.2 (L) 3.5 - 5.0 g/dL   AST 14 (L) 15 - 41 U/L   ALT 7 0 - 44 U/L   Alkaline Phosphatase 82 38 - 126 U/L   Total Bilirubin 0.5 0.0 - 1.2 mg/dL   GFR, Estimated 37 (L) >60 mL/min    Comment: (NOTE) Calculated using the CKD-EPI Creatinine Equation (2021)    Anion gap 7 5 - 15    Comment: Performed at [facility], [address] [place], [place] [address]  CBC     Status: Abnormal   Collection Time: 02/15/24  9:14 PM  Result Value Ref Range   WBC 21.5 (H) 4.0 - 10.5 K/uL   RBC 3.53 (L) 3.87 -  5.11 MIL/uL   Hemoglobin 11.3 (L) 12.0 - 15.0 g/dL   HCT 65.8 (L) 63.9 - 53.9 %   MCV 96.6 80.0 - 100.0 fL   MCH 32.0 26.0 - 34.0 pg   MCHC 33.1 30.0 - 36.0 g/dL   RDW 85.1 88.4 - 84.4 %   Platelets 405 (H) 150 - 400 K/uL   nRBC 0.0 0.0 - 0.2 %    Comment: Performed at [facility], [address] [place], [place] [address]  Resp panel by RT-PCR (RSV, Flu A&B, Covid) Anterior Nasal Swab     Status: None   Collection Time: 02/15/24 10:00 PM   Specimen: Anterior Nasal Swab  Result Value Ref Range   SARS Coronavirus 2 by RT PCR NEGATIVE NEGATIVE    Comment: (NOTE) SARS-CoV-2 target nucleic acids are NOT DETECTED.  The SARS-CoV-2 RNA is generally detectable in upper respiratory specimens during the acute phase of infection. The lowest concentration of SARS-CoV-2 viral copies this assay can detect is 138 copies/mL. A negative result does not preclude SARS-Cov-2 infection and should not be used as the sole basis for treatment or other patient management decisions. A negative result may occur with  improper specimen collection/handling, submission of specimen other than nasopharyngeal swab, presence of viral mutation(s) within the areas targeted by this assay, and inadequate number of viral copies(<138 copies/mL). A negative result must be combined with clinical observations, patient history, and epidemiological information. The expected result is Negative.  Fact Sheet for Patients:  BloggerCourse.com  Fact Sheet for Healthcare Providers:  SeriousBroker.it  This test is no t yet approved or cleared by the [place]  FDA and  has been authorized for detection and/or diagnosis of SARS-CoV-2 by FDA under an Emergency Use Authorization (EUA). This EUA will remain  in effect (meaning this test can be used) for the duration of the COVID-19 declaration under Section 564(b)(1) of the Act, 21 U.S.C.section 360bbb-3(b)(1), unless  the authorization is terminated  or revoked sooner.       Influenza A by PCR NEGATIVE NEGATIVE   Influenza B by PCR NEGATIVE NEGATIVE    Comment: (NOTE) The Xpert Xpress SARS-CoV-2/FLU/RSV plus assay is intended as an aid in the diagnosis of influenza from Nasopharyngeal swab specimens and should not be used as a sole basis for treatment. Nasal washings and aspirates are unacceptable for Xpert Xpress SARS-CoV-2/FLU/RSV testing.  Fact Sheet for Patients: BloggerCourse.com  Fact Sheet for Healthcare Providers: SeriousBroker.it  This test is not yet approved or cleared by the [place]  FDA and has been authorized for  detection and/or diagnosis of SARS-CoV-2 by FDA under an Emergency Use Authorization (EUA). This EUA will remain in effect (meaning this test can be used) for the duration of the COVID-19 declaration under Section 564(b)(1) of the Act, 21 U.S.C. section 360bbb-3(b)(1), unless the authorization is terminated or revoked.     Resp Syncytial Virus by PCR NEGATIVE NEGATIVE    Comment: (NOTE) Fact Sheet for Patients: BloggerCourse.com  Fact Sheet for Healthcare Providers: SeriousBroker.it  This test is not yet approved or cleared by the [place]  FDA and has been authorized for detection and/or diagnosis of SARS-CoV-2 by FDA under an Emergency Use Authorization (EUA). This EUA will remain in effect (meaning this test can be used) for the duration of the COVID-19 declaration under Section 564(b)(1) of the Act, 21 U.S.C. section 360bbb-3(b)(1), unless the authorization is terminated or revoked.  Performed at [facility], [address] [place], [place] [address]   Urinalysis, Routine w reflex microscopic -Urine, Clean Catch     Status: Abnormal   Collection Time: 02/16/24 12:20 AM  Result Value Ref Range   Color, Urine YELLOW (A) YELLOW    APPearance HAZY (A) CLEAR   Specific Gravity, Urine 1.020 1.005 - 1.030   pH 5.0 5.0 - 8.0   Glucose, UA NEGATIVE NEGATIVE mg/dL   Hgb urine dipstick SMALL (A) NEGATIVE   Bilirubin Urine NEGATIVE NEGATIVE   Ketones, ur NEGATIVE NEGATIVE mg/dL   Protein, ur 899 (A) NEGATIVE mg/dL   Nitrite POSITIVE (A) NEGATIVE   Leukocytes,Ua SMALL (A) NEGATIVE   RBC / HPF 11-20 0 - 5 RBC/hpf   WBC, UA >50 0 - 5 WBC/hpf   Bacteria, UA RARE (A) NONE SEEN   Squamous Epithelial / HPF 0 0 - 5 /HPF   Mucus PRESENT    Hyaline Casts, UA PRESENT     Comment: Performed at [facility], [address] [place], [place] [address]  CBC     Status: Abnormal   Collection Time: 02/16/24  2:47 AM  Result Value Ref Range   WBC 18.9 (H) 4.0 - 10.5 K/uL   RBC 4.08 3.87 - 5.11 MIL/uL   Hemoglobin 13.0 12.0 - 15.0 g/dL   HCT 59.8 63.9 - 53.9 %   MCV 98.3 80.0 - 100.0 fL   MCH 31.9 26.0 - 34.0 pg   MCHC 32.4 30.0 - 36.0 g/dL   RDW 84.8 88.4 - 84.4 %   Platelets 406 (H) 150 - 400 K/uL   nRBC 0.0 0.0 - 0.2 %    Comment: Performed at [facility], [address] [place], [place] [address]  Basic metabolic panel     Status: Abnormal   Collection Time: 02/16/24  2:47 AM  Result Value Ref Range   Sodium 144 135 - 145 mmol/L   Potassium 4.0 3.5 - 5.1 mmol/L   Chloride 110 98 - 111 mmol/L   CO2 19 (L) 22 - 32 mmol/L   Glucose, Bld 108 (H) 70 - 99 mg/dL    Comment: Glucose reference range applies only to samples taken after fasting for at least 8 hours.   BUN 22 8 - 23 mg/dL   Creatinine, Ser 8.54 (H) 0.44 - 1.00 mg/dL   Calcium 8.9 8.9 - 89.6 mg/dL   GFR, Estimated 40 (L) >60 mL/min    Comment: (NOTE) Calculated using the CKD-EPI Creatinine Equation (2021)    Anion gap 15 5 - 15    Comment: Performed at [facility], [address] [place], [place] [address]   CT ABDOMEN PELVIS W CONTRAST Result Date: 02/15/2024 CLINICAL DATA:  Nausea/vomiting/diarrhea for 1 week EXAM: CT ABDOMEN  AND PELVIS WITH CONTRAST TECHNIQUE: Multidetector CT imaging of the abdomen and pelvis was performed using the standard protocol following bolus administration of intravenous contrast. RADIATION DOSE REDUCTION: This exam was performed according to the departmental dose-optimization program which includes automated exposure control, adjustment of the mA and/or kV according to patient size and/or use of iterative reconstruction technique. CONTRAST:  75mL OMNIPAQUE IOHEXOL 300 MG/ML  SOLN COMPARISON:  10/16/2020 FINDINGS: Lower chest: Dependent hypoventilatory changes. No acute pleural or parenchymal lung disease. Moderate hiatal hernia. Hepatobiliary: Gallbladder is markedly distended, with no evidence of gallbladder wall thickening, pericholecystic fluid, or gallstones. Liver is unremarkable. No biliary duct dilation. Pancreas: Unremarkable. No pancreatic ductal dilatation or surrounding inflammatory changes. Spleen: Normal in size without focal abnormality. Adrenals/Urinary Tract: Adrenals are stable. No urinary tract calculi or obstructive uropathy within either kidney. Subtle striated nephrogram appearance of the bilateral kidneys on delayed imaging, right greater than left, which may reflect pyelonephritis. Please correlate with urinalysis. The bladder is unremarkable. Stomach/Bowel: No bowel obstruction or ileus. The appendix, if still present, is not well visualized. No bowel wall thickening or inflammatory change. Vascular/Lymphatic: Aortic atherosclerosis. No enlarged abdominal or pelvic lymph nodes. Reproductive: Uterus and bilateral adnexa are unremarkable. Other: No free fluid or free intraperitoneal gas. No abdominal wall hernia. Musculoskeletal: No acute or destructive bony abnormalities. Postsurgical changes at the L4-5 level. Reconstructed images demonstrate no additional findings. IMPRESSION: 1. Subtle striated nephrogram appearance of the bilateral kidneys, which could reflect pyelonephritis. Please  correlate with urinalysis. 2. Marked gallbladder distension, with no evidence of cholelithiasis or cholecystitis. 3. Moderate hiatal hernia. 4.  Aortic Atherosclerosis (ICD10-I70.0). Electronically Signed   By: Ozell Daring M.D.   On: 02/15/2024 23:02    Pending Labs Unresulted Labs (From admission, onward)     Start     Ordered   02/23/24 0500  Creatinine, serum  (enoxaparin (LOVENOX)    CrCl >/= 30 ml/min)  Weekly,   TIMED     Comments: while on enoxaparin therapy    02/16/24 0226   02/16/24 0225  HIV Antibody (routine testing w rflx)  (HIV Antibody (Routine testing w reflex) panel)  Once,   R        02/16/24 0226   02/16/24 0045  Urine Culture  Add-on,   AD       Question:  Indication  Answer:  Dysuria   02/16/24 0045            Vitals/Pain Today's Vitals   02/16/24 0122 02/16/24 0130 02/16/24 0151 02/16/24 0321  BP:  122/74    Pulse:  80    Resp:      Temp:    98.2 F (36.8 C)  TempSrc:    Oral  SpO2:  96%    Weight:      Height:      PainSc: 9   Asleep     Isolation Precautions No active isolations  Medications Medications  cefTRIAXone (ROCEPHIN) 1 g in sodium chloride  0.9 % 100 mL IVPB (has no administration in time range)  enoxaparin (LOVENOX) injection 40 mg (has no administration in time range)  acetaminophen  (TYLENOL ) tablet 650 mg (has no administration in time range)    Or  acetaminophen  (TYLENOL ) suppository 650 mg (has no administration in time range)  ondansetron  (ZOFRAN ) tablet 4 mg (has no administration in time range)  Or  ondansetron  (ZOFRAN ) injection 4 mg (has no administration in time range)  oxyCODONE  (Oxy IR/ROXICODONE ) immediate release tablet 5 mg (has no administration in time range)  ketorolac (TORADOL) 15 MG/ML injection 15 mg (has no administration in time range)  lactated ringers  infusion ( Intravenous New Bag/Given 02/16/24 0324)  HYDROmorphone  (DILAUDID ) injection 1 mg (1 mg Intravenous Given 02/15/24 2151)  ondansetron   (ZOFRAN ) injection 4 mg (4 mg Intravenous Given 02/15/24 2151)  iohexol (OMNIPAQUE) 300 MG/ML solution 75 mL (75 mLs Intravenous Contrast Given 02/15/24 2242)  lactated ringers  bolus 1,000 mL (0 mLs Intravenous Stopped 02/16/24 0320)  cefTRIAXone (ROCEPHIN) 1 g in sodium chloride  0.9 % 100 mL IVPB (0 g Intravenous Stopped 02/16/24 0146)  HYDROmorphone  (DILAUDID ) injection 1 mg (1 mg Intravenous Given 02/16/24 0122)    Mobility walks     Focused Assessments Renal Assessment Handoff:  Hemodialysis Schedule:  Last Hemodialysis date and time:    Restricted appendage:     R Recommendations: See Admitting Provider Note  Report given to:   Additional Notes:

## 2024-02-17 LAB — CBC
HCT: 38.7 % (ref 36.0–46.0)
Hemoglobin: 12.6 g/dL (ref 12.0–15.0)
MCH: 32 pg (ref 26.0–34.0)
MCHC: 32.6 g/dL (ref 30.0–36.0)
MCV: 98.2 fL (ref 80.0–100.0)
Platelets: 374 K/uL (ref 150–400)
RBC: 3.94 MIL/uL (ref 3.87–5.11)
RDW: 15 % (ref 11.5–15.5)
WBC: 13 K/uL — ABNORMAL HIGH (ref 4.0–10.5)
nRBC: 0 % (ref 0.0–0.2)

## 2024-02-17 LAB — BASIC METABOLIC PANEL WITH GFR
Anion gap: 7 (ref 5–15)
BUN: 14 mg/dL (ref 8–23)
CO2: 25 mmol/L (ref 22–32)
Calcium: 8.7 mg/dL — ABNORMAL LOW (ref 8.9–10.3)
Chloride: 110 mmol/L (ref 98–111)
Creatinine, Ser: 1.3 mg/dL — ABNORMAL HIGH (ref 0.44–1.00)
GFR, Estimated: 45 mL/min — ABNORMAL LOW (ref >60)
Glucose, Bld: 106 mg/dL — ABNORMAL HIGH (ref 70–99)
Potassium: 4 mmol/L (ref 3.5–5.1)
Sodium: 142 mmol/L (ref 135–145)

## 2024-02-17 LAB — MAGNESIUM: Magnesium: 2.1 mg/dL (ref 1.7–2.4)

## 2024-02-17 MED ORDER — ALUM & MAG HYDROXIDE-SIMETH 200-200-20 MG/5ML PO SUSP
30.0000 mL | ORAL | Status: DC | PRN
  Administered 2024-02-17 – 2024-02-18 (×2): 30 mL via ORAL
  Filled 2024-02-17 (×2): qty 30

## 2024-02-17 NOTE — Progress Notes (Signed)
  PROGRESS NOTE    Jenna Davis  FMW:[id number] DOB: Apr 10, 1956 DOA: 02/15/2024 PCP: Rod Batter A, PA  154A/[id number]-AA  LOS: 1 day   Brief hospital course:   Assessment & Plan: Jenna Davis is a 68 y.o. female with medical history significant for  functional abdominal pain, , rheumatoid arthritis on methotrexate, chronic low back pain, idiopathic neuropathy on chronic opiates, cyclical vomiting syndrome recently started on oral Haldol  and dicyclomine  by PCP on 9/16 with recommendation to discontinue marijuana use, who presented with back pain and intractable left-sided abdominal pain associated with nausea and vomiting    * Acute pyelonephritis --CT a/p showed Subtle striated nephrogram appearance of the bilateral kidneys on delayed imaging, right greater than left, which may reflect pyelonephritis. --cont ceftriaxone pending urine cx sensitivity results.  Possible pancreatitis --lipase 107. --cont MIVF --advance to low-fat diet today  AKI (acute kidney injury) --cont MIVF  Cyclical vomiting syndrome Recently placed on Haldol  and dicyclomine  --cont home haldol   RA (rheumatoid arthritis) (HCC) Chronic immunosuppressive therapy-methotrexate Hold methotrexate for now No evidence of acute flare  Chronic back pain Chronic neuropathic pain On chronic opiates --has current Rx for tramadol and gabapentin  --cont pain med as oral morphine  for now  Tobacco use disorder --nicotine patch   DVT prophylaxis: Lovenox SQ Code Status: Full code  Family Communication:  Level of care: Med-Surg Dispo:   The patient is from: home Anticipated d/c is to: home Anticipated d/c date is: tomorrow   Subjective and Interval History:  Pt reported feeling much better today.  Abdominal pain now mild.     Objective: Vitals:   02/17/24 0357 02/17/24 0736 02/17/24 1516 02/17/24 1925  BP: 129/68 129/88 117/86 132/82  Pulse: 69 72 75 76  Resp: 18 16 17 15   Temp: 97.9 F (36.6 C)  97.8 F (36.6 C) 97.9 F (36.6 C) 98.2 F (36.8 C)  TempSrc:  Oral    SpO2: 98% 100% 100% 99%  Weight:      Height:        Intake/Output Summary (Last 24 hours) at 02/17/2024 2104 Last data filed at 02/17/2024 1735 Gross per 24 hour  Intake 3200.9 ml  Output --  Net 3200.9 ml   Filed Weights   02/15/24 2104 02/16/24 0442  Weight: 55.8 kg 54.3 kg    Examination:   Constitutional: NAD, AAOx3 HEENT: conjunctivae and lids normal, EOMI CV: No cyanosis.   RESP: normal respiratory effort, on RA Neuro: II - XII grossly intact.   Psych: better mood and affect.  Appropriate judgement and reason   Data Reviewed: I have personally reviewed labs and imaging studies   Ellouise Haber, MD [facility] If 7PM-7AM, please contact night-coverage 02/17/2024, 9:04 PM

## 2024-02-17 NOTE — Progress Notes (Signed)
  Chaplain On-Call responded to Spiritual Care Consult Order from Ellouise Haber, MD.  The request was for Advance Directives information for the patient.  Chaplain offered the AD documents and education to the patient, who stated her understanding.  Chaplain provided spiritual and emotional support as the patient described situations for her family members where unclear decision-making created many medical challenges.  Chaplain Bebe Ardean EMERSON Hershal., [facility]

## 2024-02-18 ENCOUNTER — Inpatient Hospital Stay: Admitting: Anesthesiology

## 2024-02-18 ENCOUNTER — Inpatient Hospital Stay

## 2024-02-18 ENCOUNTER — Encounter: Admission: EM | Disposition: A | Payer: Self-pay | Source: Home / Self Care | Attending: Hospitalist

## 2024-02-18 DIAGNOSIS — N10 Acute tubulo-interstitial nephritis: Secondary | ICD-10-CM | POA: Diagnosis not present

## 2024-02-18 HISTORY — PX: SHOULDER CLOSED REDUCTION: SHX1051

## 2024-02-18 LAB — BASIC METABOLIC PANEL WITH GFR
Anion gap: 8 (ref 5–15)
BUN: 9 mg/dL (ref 8–23)
CO2: 24 mmol/L (ref 22–32)
Calcium: 8.7 mg/dL — ABNORMAL LOW (ref 8.9–10.3)
Chloride: 110 mmol/L (ref 98–111)
Creatinine, Ser: 1.03 mg/dL — ABNORMAL HIGH (ref 0.44–1.00)
GFR, Estimated: 60 mL/min — ABNORMAL LOW (ref >60)
Glucose, Bld: 112 mg/dL — ABNORMAL HIGH (ref 70–99)
Potassium: 4 mmol/L (ref 3.5–5.1)
Sodium: 142 mmol/L (ref 135–145)

## 2024-02-18 LAB — CBC
HCT: 35.8 % — ABNORMAL LOW (ref 36.0–46.0)
Hemoglobin: 11.4 g/dL — ABNORMAL LOW (ref 12.0–15.0)
MCH: 31 pg (ref 26.0–34.0)
MCHC: 31.8 g/dL (ref 30.0–36.0)
MCV: 97.3 fL (ref 80.0–100.0)
Platelets: 404 K/uL — ABNORMAL HIGH (ref 150–400)
RBC: 3.68 MIL/uL — ABNORMAL LOW (ref 3.87–5.11)
RDW: 14.6 % (ref 11.5–15.5)
WBC: 10 K/uL (ref 4.0–10.5)
nRBC: 0 % (ref 0.0–0.2)

## 2024-02-18 LAB — URINE CULTURE: Culture: >=100000 — AB

## 2024-02-18 SURGERY — CLOSED REDUCTION, SHOULDER
Anesthesia: General | Site: Shoulder | Laterality: Right

## 2024-02-18 MED ORDER — HYDROMORPHONE HCL 1 MG/ML IJ SOLN
0.5000 mg | Freq: Once | INTRAMUSCULAR | Status: AC
  Administered 2024-02-18: 0.5 mg via INTRAVENOUS
  Filled 2024-02-18: qty 0.5

## 2024-02-18 MED ORDER — OXYCODONE HCL 5 MG PO TABS
5.0000 mg | ORAL_TABLET | ORAL | Status: DC | PRN
  Administered 2024-02-19: 5 mg via ORAL
  Filled 2024-02-18: qty 1

## 2024-02-18 MED ORDER — TRAMADOL HCL 50 MG PO TABS
50.0000 mg | ORAL_TABLET | Freq: Three times a day (TID) | ORAL | Status: DC | PRN
  Administered 2024-02-18 – 2024-02-19 (×3): 50 mg via ORAL
  Filled 2024-02-18 (×3): qty 1

## 2024-02-18 MED ORDER — FENTANYL CITRATE (PF) 100 MCG/2ML IJ SOLN
INTRAMUSCULAR | Status: DC | PRN
  Administered 2024-02-18: 50 ug via INTRAVENOUS

## 2024-02-18 MED ORDER — OXYCODONE HCL 5 MG/5ML PO SOLN: 5.0000 mg | Freq: Once | ORAL | Status: DC | PRN

## 2024-02-18 MED ORDER — DICYCLOMINE HCL 20 MG PO TABS: 20.0000 mg | ORAL_TABLET | Freq: Four times a day (QID) | ORAL | Status: DC | PRN

## 2024-02-18 MED ORDER — FENTANYL CITRATE (PF) 100 MCG/2ML IJ SOLN
INTRAMUSCULAR | Status: AC
  Filled 2024-02-18: qty 2

## 2024-02-18 MED ORDER — LIDOCAINE HCL (CARDIAC) PF 100 MG/5ML IV SOSY
PREFILLED_SYRINGE | INTRAVENOUS | Status: DC | PRN
Start: 2024-02-18 — End: 2024-02-18
  Administered 2024-02-18: 60 mg via INTRAVENOUS

## 2024-02-18 MED ORDER — SODIUM CHLORIDE 0.9 % IV SOLN
12.5000 mg | Freq: Four times a day (QID) | INTRAVENOUS | Status: DC | PRN
  Administered 2024-02-18: 12.5 mg via INTRAVENOUS
  Filled 2024-02-18 (×2): qty 0.5

## 2024-02-18 MED ORDER — HYDROMORPHONE HCL 1 MG/ML IJ SOLN: 1.0000 mg | INTRAMUSCULAR | Status: DC | PRN

## 2024-02-18 MED ORDER — MIDAZOLAM HCL 2 MG/2ML IJ SOLN
INTRAMUSCULAR | Status: DC | PRN
Start: 2024-02-18 — End: 2024-02-18
  Administered 2024-02-18: 2 mg via INTRAVENOUS

## 2024-02-18 MED ORDER — PROPOFOL 10 MG/ML IV BOLUS
INTRAVENOUS | Status: DC | PRN
  Administered 2024-02-18: 90 mg via INTRAVENOUS

## 2024-02-18 MED ORDER — HYDROMORPHONE HCL 1 MG/ML IJ SOLN
0.6000 mg | INTRAMUSCULAR | Status: DC | PRN
  Administered 2024-02-18: 0.6 mg via INTRAVENOUS
  Filled 2024-02-18 (×2): qty 1

## 2024-02-18 MED ORDER — LACTATED RINGERS IV SOLN: INTRAVENOUS | Status: DC | PRN

## 2024-02-18 MED ORDER — DEXAMETHASONE SODIUM PHOSPHATE 10 MG/ML IJ SOLN
INTRAMUSCULAR | Status: DC | PRN
  Administered 2024-02-18: 10 mg via INTRAVENOUS

## 2024-02-18 MED ORDER — GABAPENTIN 300 MG PO CAPS
300.0000 mg | ORAL_CAPSULE | Freq: Three times a day (TID) | ORAL | Status: DC
Start: 2024-02-18 — End: 2024-02-19
  Administered 2024-02-19 (×2): 300 mg via ORAL
  Filled 2024-02-18 (×2): qty 1

## 2024-02-18 MED ORDER — LIDOCAINE HCL (PF) 2 % IJ SOLN
INTRAMUSCULAR | Status: AC
  Filled 2024-02-18: qty 5

## 2024-02-18 MED ORDER — POLYETHYLENE GLYCOL 3350 17 G PO PACK
17.0000 g | PACK | Freq: Two times a day (BID) | ORAL | Status: DC
  Administered 2024-02-19 (×2): 17 g via ORAL
  Filled 2024-02-18 (×2): qty 1

## 2024-02-18 MED ORDER — SUCCINYLCHOLINE CHLORIDE 200 MG/10ML IV SOSY
PREFILLED_SYRINGE | INTRAVENOUS | Status: AC
  Filled 2024-02-18: qty 10

## 2024-02-18 MED ORDER — MIDAZOLAM HCL 2 MG/2ML IJ SOLN
INTRAMUSCULAR | Status: AC
  Filled 2024-02-18: qty 2

## 2024-02-18 MED ORDER — ONDANSETRON HCL 4 MG/2ML IJ SOLN
INTRAMUSCULAR | Status: DC | PRN
Start: 2024-02-18 — End: 2024-02-18
  Administered 2024-02-18: 4 mg via INTRAVENOUS

## 2024-02-18 MED ORDER — OXYCODONE HCL 5 MG PO TABS: 5.0000 mg | ORAL_TABLET | Freq: Once | ORAL | Status: DC | PRN

## 2024-02-18 MED ORDER — PROPOFOL 10 MG/ML IV BOLUS
INTRAVENOUS | Status: AC
  Filled 2024-02-18: qty 20

## 2024-02-18 MED ORDER — DEXAMETHASONE SODIUM PHOSPHATE 10 MG/ML IJ SOLN
INTRAMUSCULAR | Status: AC
  Filled 2024-02-18: qty 1

## 2024-02-18 MED ORDER — SUCCINYLCHOLINE CHLORIDE 200 MG/10ML IV SOSY
PREFILLED_SYRINGE | INTRAVENOUS | Status: DC | PRN
  Administered 2024-02-18: 80 mg via INTRAVENOUS

## 2024-02-18 MED ORDER — ONDANSETRON HCL 4 MG/2ML IJ SOLN
INTRAMUSCULAR | Status: AC
Start: 2024-02-18 — End: 2024-02-18
  Filled 2024-02-18: qty 2

## 2024-02-18 MED ORDER — FENTANYL CITRATE (PF) 100 MCG/2ML IJ SOLN: 25.0000 ug | INTRAMUSCULAR | Status: DC | PRN

## 2024-02-18 MED ORDER — POLYETHYLENE GLYCOL 3350 17 G PO PACK
17.0000 g | PACK | Freq: Every day | ORAL | Status: DC | PRN
  Administered 2024-02-18: 17 g via ORAL
  Filled 2024-02-18: qty 1

## 2024-02-18 SURGICAL SUPPLY — 2 items
KIT TURNOVER KIT A (KITS) IMPLANT
SLING ARM M TX990204 (SOFTGOODS) IMPLANT

## 2024-02-18 NOTE — Progress Notes (Signed)
 Pt has returned from OR alert and oriented x 4, denied pain at this arm. Right arm warm to touch, sensation intact except for slight tingling to finger tips that has improved. VS stable, no signs of distress, pt resting comfortably.

## 2024-02-18 NOTE — Op Note (Signed)
 SURGERY DATE: 02/18/2024     PRE-OP DIAGNOSIS:  1.  Right glenohumeral dislocation   POST-OP DIAGNOSIS:  1.  Right glenohumeral dislocation   PROCEDURE(S): 1. Closed reduction of right glenohumeral dislocation under anesthesia   SURGEON: Earnestine HILARIO Blanch, MD    ANESTHESIA: Gen   ESTIMATED BLOOD LOSS: none   TOTAL IV FLUIDS: none   INDICATION(S): Jenna Davis is a 68 y.o. female  who sustained a glenohumeral joint dislocation after an episode of vomiting/dry heaving while admitted to the hospital for pyelonephritis.  Radiographs showed anterior glenohumeral joint dislocation. After discussion of risks, benefits, and alternatives to surgery, the patient elected to proceed with above procedure.   OPERATIVE FINDINGS: Anterior inferior glenohumeral dislocation    OPERATIVE REPORT:   The patient was seen in the Holding Room. The patient concurred with the proposed plan, giving informed consent. The site of surgery was properly noted/marked. The patient was taken to Operating Room. A Time Out was held and the patient identity, procedure, and laterality was confirmed. After administration of adequate anesthesia, FARES reduction maneuver was utilized and palpable reduction was achieved. Range of motion was normalized. The shoulder was stable through a functional range of motion.  Fluoroscopy confirmed glenohumeral joint reduction. Patient was placed in a sling. The patient was awakened from anesthesia without any further complication and transferred to PACU for further recovery.     POST-OPERATIVE PLAN:  Patient will return to inpatient unit. The patient will be NWB on operative extremity.  Maintain shoulder immobilizer at all times.  Follow-up in approximately 2 weeks.

## 2024-02-18 NOTE — Progress Notes (Signed)
 Report given to OR, they made aware that pt has been NPO since 0630pm.

## 2024-02-18 NOTE — Progress Notes (Signed)
 Pt transported to OR at 2114.

## 2024-02-18 NOTE — Progress Notes (Addendum)
 Pain meds given as ordered, pt verbalized some improvement. Still c/o numbness and tingling to finger tips on right hand, unchanged comparing to initial. Radial and ulnar pulse strong and equal bilaterally, mild swelling at shoulder, pt able to feel light touch to right arm and hand. Color appropriate and skin warm and dry to touch.  Djan, MD at the bedside, around 1930, assessed pt abdomen and right shoulder, new orders for pain med placed.

## 2024-02-18 NOTE — Plan of Care (Signed)
  Problem: Education: Goal: Knowledge of General Education information will improve Description: Including pain rating scale, medication(s)/side effects and non-pharmacologic comfort measures Outcome: Progressing   Problem: Health Behavior/Discharge Planning: Goal: Ability to manage health-related needs will improve Outcome: Progressing   Problem: Clinical Measurements: Goal: Ability to maintain clinical measurements within normal limits will improve Outcome: Progressing Goal: Will remain free from infection Outcome: Progressing Goal: Diagnostic test results will improve Outcome: Progressing Goal: Respiratory complications will improve Outcome: Progressing Goal: Cardiovascular complication will be avoided Outcome: Progressing   Problem: Activity: Goal: Risk for activity intolerance will decrease Outcome: Progressing   Problem: Elimination: Goal: Will not experience complications related to bowel motility Outcome: Progressing Goal: Will not experience complications related to urinary retention Outcome: Progressing   Problem: Safety: Goal: Ability to remain free from injury will improve Outcome: Progressing   Problem: Skin Integrity: Goal: Risk for impaired skin integrity will decrease Outcome: Progressing   Problem: Nutrition: Goal: Adequate nutrition will be maintained Outcome: Not Progressing   Problem: Coping: Goal: Level of anxiety will decrease Outcome: Not Progressing   Problem: Pain Managment: Goal: General experience of comfort will improve and/or be controlled Outcome: Not Progressing

## 2024-02-18 NOTE — H&P (Signed)
 H&P reviewed. Acute R glenohumeral dislocation earlier today. Plan for OR for closed reduction.

## 2024-02-18 NOTE — Progress Notes (Signed)
 Patient called out for nausea medication around 1820, Zofran  PRN given to patient. Patient found to be diaphoretic and in extreme pain at time of administration, pain in 10 out of 10 pain in abdomen and shoulder. Primary RN to bedside and paged Laree Lock MD. Patient expressed that she was vomiting and heard/ felt a pop in her shoulder, then extreme pain. Hospitalist Shruthi Ponnala to bedside to evaluate patient. RN contacted ICU rapid response nurse to evaluate patient. Hospitalist ordered stat xray of shoulder and abdomen and then IV pain medication. Patient IV in right forearm dislodged and IV placement attempted several times by nurses on floor. Stat IV team consult order placed. Xray to bedside. Radiologist called with results of anterior inferior shoulder dislocation. On call hospitalist ordered ortho consult. See ortho consult orders. IV team able to place IV and patient able to receive pain medication. Patient 1958: On call MD Drue Potter at bedside.

## 2024-02-18 NOTE — Anesthesia Procedure Notes (Signed)
 Procedure Name: Intubation Date/Time: 02/18/2024 9:54 PM  Performed by: Tod Handing, CRNAPre-anesthesia Checklist: Patient identified, Emergency Drugs available, Suction available and Patient being monitored Patient Re-evaluated:Patient Re-evaluated prior to induction Oxygen Delivery Method: Circle system utilized Preoxygenation: Pre-oxygenation with 100% oxygen Induction Type: IV induction, Cricoid Pressure applied and Rapid sequence Laryngoscope Size: Mac, 3 and McGrath Grade View: Grade I Tube type: Oral Tube size: 6.5 mm Number of attempts: 1 Airway Equipment and Method: Stylet Placement Confirmation: ETT inserted through vocal cords under direct vision, positive ETCO2 and breath sounds checked- equal and bilateral Secured at: 20 cm Tube secured with: Tape Dental Injury: Teeth and Oropharynx as per pre-operative assessment

## 2024-02-18 NOTE — Progress Notes (Signed)
 Was asked to contact IV team by RN at bedside. Called, and did not get an answer. Informed RN at bedside unable to reach IV team

## 2024-02-18 NOTE — Transfer of Care (Signed)
 Immediate Anesthesia Transfer of Care Note  Patient: Jenna Davis  Procedure(s) Performed: Procedure(s): CLOSED REDUCTION, SHOULDER (Right)  Patient Location: PACU  Anesthesia Type:General  Level of Consciousness: sedated  Airway & Oxygen Therapy: Patient Spontanous Breathing and Patient connected to face mask oxygen  Post-op Assessment: Report given to RN and Post -op Vital signs reviewed and stable  Post vital signs: Reviewed and stable  Last Vitals:  Vitals:   02/18/24 2123 02/18/24 2213  BP: (!) 174/94 (!) 137/91  Pulse: 78   Resp: 16 19  Temp: (!) 36.2 C   SpO2: 99% 99%    Complications: No apparent anesthesia complications

## 2024-02-18 NOTE — Consult Note (Signed)
 ORTHOPAEDIC CONSULTATION  REQUESTING PHYSICIAN: Jerelene Critchley, MD  Chief Complaint:   Right shoulder pain  History of Present Illness: Jenna Davis is a 68 y.o. female who is right-hand dominant who has been admitted to the hospital Since 02/16/2024 with acute pyelonephritis.  She had severe nausea and had a vomiting episode with possible dry heaving at that time earlier this evening.  She felt a pop in the right shoulder and has had severe shoulder pain with limited ability to move the shoulder since that time.  Radiographs showed an anterior glenohumeral dislocation.  Patient has noted increased numbness and tingling in her fingertips since the time of injury.  Patient states she lives alone.  She has family that lives nearby.  She works part-time doing Ambulance person.  Of note, she has a significant medical history  For chronic abdominal pain, rheumatoid arthritis on methotrexate, chronic low back pain, intermittent neuropathic type symptoms.  She also has cyclical vomiting syndrome due to marijuana use.  Past Medical History:  Diagnosis Date   A-fib [facility])    Depression    GERD (gastroesophageal reflux disease)    RA (rheumatoid arthritis) (HCC)    Rheumatoid arthritis (HCC)    Past Surgical History:  Procedure Laterality Date   ABDOMINAL EXPOSURE N/A 10/11/2020   Procedure: ABDOMINAL EXPOSURE;  Surgeon: Gretta Lonni PARAS, MD;  Location: [facility] OR;  Service: Vascular;  Laterality: N/A;   ANTERIOR LUMBAR FUSION N/A 10/11/2020   Procedure: ANTERIOR LUMBAR INTERBODY FUSION LUMBAR 4 - LUMBAR 5 WITH POSTERIOR SPINAL FUSION WITH INSTRUMENTATION AND ALLOGRAFT;  Surgeon: Beuford Anes, MD;  Location: [facility] OR;  Service: Orthopedics;  Laterality: N/A;   ATRIAL FIBRILLATION ABLATION     OVARIAN CYST REMOVAL     Social History   Socioeconomic History   Marital status: Single    Spouse name: Not on file   Number of  children: Not on file   Years of education: Not on file   Highest education level: Not on file  Occupational History   Not on file  Tobacco Use   Smoking status: Every Day    Current packs/day: 0.50    Types: Cigarettes   Smokeless tobacco: Never  Vaping Use   Vaping status: Never Used  Substance and Sexual Activity   Alcohol use: No   Drug use: No   Sexual activity: Not on file  Other Topics Concern   Not on file  Social History Narrative   Not on file   Social Drivers of Health   Financial Resource Strain: High Risk (04/29/2023)   Received from [facility]   Overall Financial Resource Strain (CARDIA)    Difficulty of Paying Living Expenses: Hard  Food Insecurity: No Food Insecurity (02/16/2024)   Hunger Vital Sign    Worried About Running Out of Food in the Last Year: Never true    Ran Out of Food in the Last Year: Never true  Transportation Needs: Unmet Transportation Needs (02/16/2024)   PRAPARE - Administrator, Civil Service (Medical): Yes    Lack of Transportation (Non-Medical): Yes  Physical Activity: Unknown (02/02/2019)   Received from [facility]   Exercise Vital Sign    On average, how many days per week do you engage in moderate to strenuous exercise (like a brisk walk)?: Patient declined    On average, how many minutes do you engage in exercise at this level?: Patient declined  Stress: Unknown (02/02/2019)   Received from [facility]  [employer] of Occupational Health - Occupational Stress Questionnaire    Feeling of Stress : Patient declined  Social Connections: Moderately Isolated (02/16/2024)   Social Connection and Isolation Panel    Frequency of Communication with Friends and Family: More than three times a week    Frequency of Social Gatherings with Friends and Family: More than three times a week    Attends Religious Services: More than 4 times per year    Active Member of [employer] or Organizations: No    Attends Tax inspector Meetings: Never    Marital Status: Never married   Family History  Problem Relation Age of Onset   Diabetes Mother    Breast cancer Mother    Diabetes Father    No Known Allergies Prior to Admission medications   Medication Sig Start Date End Date Taking? Authorizing Provider  acetaminophen  (TYLENOL ) 325 MG tablet Take 2 tablets (650 mg total) by mouth every 6 (six) hours as needed for mild pain (or Fever >/= 101). 01/27/16   Sherial Bail, MD  buPROPion  (WELLBUTRIN  XL) 150 MG 24 hr tablet Take 150 mg by mouth every morning. 08/25/20   [provider]  Cholecalciferol  (D3 PO) Take 1 capsule by mouth daily.    [provider]  cyclobenzaprine  (FLEXERIL ) 5 MG tablet Take 5-10 mg by mouth at bedtime as needed for pain. 09/06/20   [provider]  dicyclomine  (BENTYL ) 20 MG tablet Take 20 mg by mouth 4 (four) times daily as needed for other. Stomach cramps 09/11/20   [provider]  folic acid  (FOLVITE ) 1 MG tablet Take 1 mg by mouth daily. 02/03/15   [provider]  gabapentin  (NEURONTIN ) 300 MG capsule Take 900 mg by mouth 3 (three) times daily. 04/06/18   [provider]  haloperidol  (HALDOL ) 0.5 MG tablet Take 0.5 mg by mouth every morning. 09/19/20   [provider]  hydrOXYzine  (ATARAX /VISTARIL ) 10 MG tablet Take 10 mg by mouth 3 (three) times daily as needed for itching. 09/11/20   [provider]  methocarbamol  (ROBAXIN ) 500 MG tablet Take 1 tablet (500 mg total) by mouth every 6 (six) hours as needed for muscle spasms. 10/12/20   Dumonski, Oneil, MD  OTREXUP 25 MG/0.4ML SOAJ Inject 25 mg into the skin once a week. 07/27/20   [provider]  oxyCODONE -acetaminophen  (PERCOCET/ROXICET) 5-325 MG tablet Take 1-2 tablets by mouth every 4 (four) hours as needed for severe pain. 10/12/20   Beuford Oneil, MD  polyethylene glycol (MIRALAX  / GLYCOLAX ) 17 g packet Take 17 g by mouth 3 (three) times daily.  10/16/20   Lorriane Holmes, MD  traMADol (ULTRAM) 50 MG tablet Take 50 mg by mouth every 8 (eight) hours as needed for pain. 09/29/20   [provider]   Recent Labs    02/16/24 9752 02/17/24 0515 02/18/24 0611  WBC 18.9* 13.0* 10.0  HGB 13.0 12.6 11.4*  HCT 40.1 38.7 35.8*  PLT 406* 374 404*  K 4.0 4.0 4.0  CL 110 110 110  CO2 19* 25 24  BUN 22 14 9   CREATININE 1.45* 1.30* 1.03*  GLUCOSE 108* 106* 112*  CALCIUM 8.9 8.7* 8.7*   DG Shoulder Right Result Date: 02/18/2024 CLINICAL DATA:  Shoulder pain EXAM: RIGHT SHOULDER - 2+ VIEW COMPARISON:  11/18/2019 FINDINGS: Anterior inferior dislocation of the humeral head with respect to the glenoid fossa. Mild AC joint degenerative change. No definite acute displaced fracture. IMPRESSION: Anterior inferior shoulder dislocation. These results  will be called to the ordering clinician or representative by the Radiologist Assistant, and communication documented in the PACS or [employer]. Electronically Signed   By: Luke Bun M.D.   On: 02/18/2024 19:27   DG Abd 1 View Result Date: 02/18/2024 EXAM: 1 VIEW XRAY OF THE ABDOMEN 02/18/2024 07:11:24 PM COMPARISON: CT abdomen and pelvis 02/15/2024. CLINICAL HISTORY: Vomiting and abdominal pain. FINDINGS: BOWEL: Mild stool burden in the descending colon. Nonobstructive bowel gas pattern. SOFT TISSUES: No opaque urinary calculi. BONES: Degenerative changes of the lumbar spine and lower lumbar fusion hardware. No acute osseous abnormality. IMPRESSION: 1. No bowel obstruction. 2. Mild stool burden in the descending colon. Electronically signed by: Donnice Mania MD 02/18/2024 07:23 PM EDT RP Workstation: [id number]     Positive ROS: All other systems have been reviewed and were otherwise negative with the exception of those mentioned in the HPI and as above.  Physical Exam: BP (!) 174/94   Pulse 78   Temp (!) 97.2 F (36.2 C)   Resp 16   Ht 5' 6.5 (1.689 m)   Wt 54.3 kg   SpO2 99%   BMI  19.01 kg/m  General:  Alert, no acute distress Psychiatric:  Patient is competent for consent with normal mood and affect     Orthopedic Exam:  RUE: +ain/pin/u motor SILT r/u/m/ax, but significant numbness and tingling at fingertips +rad pulse Limited RoM of R shoulder   Imaging:  As above: anterior GH dislocation. No fx noted.  Assessment/Plan: 68 yo F with first-time R glenohumeral dislocation after vomiting/dry heaving episode while admitted to the hospital earlier today. 1.  We discussed the diagnosis as well as various treatment options.  She appears to have suffered an acute glenohumeral anterior dislocation earlier today.  Given her acute onset numbness and tingling in her fingers, we declaring this on the emergent procedure.  Plan is for closed reduction of the right shoulder in the operating room. After discussion of risks, benefits, and alternatives to surgery, the patient elected to proceed.  She does understand the risks of anesthesia as well as risks of attempted closed reduction including fracture.  2.  Plan for OR later today    Earnestine Blanch   02/18/2024 9:41 PM

## 2024-02-18 NOTE — Anesthesia Preprocedure Evaluation (Addendum)
 Anesthesia Evaluation  Patient identified by MRN, date of birth, ID band Patient awake    Reviewed: Allergy & Precautions, NPO status , Patient's Chart, lab work & pertinent test results  History of Anesthesia Complications Negative for: history of anesthetic complications  Airway Mallampati: III  TM Distance: >3 FB Neck ROM: full    Dental  (+) Chipped, Poor Dentition   Pulmonary neg shortness of breath, COPD, Current Smoker and Patient abstained from smoking.   Pulmonary exam normal        Cardiovascular Exercise Tolerance: Good (-) angina (-) Past MI negative cardio ROS Normal cardiovascular exam     Neuro/Psych  Neuromuscular disease  negative psych ROS   GI/Hepatic Neg liver ROS,GERD  Controlled,,  Endo/Other  negative endocrine ROS    Renal/GU Renal disease     Musculoskeletal   Abdominal   Peds  Hematology negative hematology ROS (+)   Anesthesia Other Findings Past Medical History: No date: A-fib (HCC) No date: Depression No date: GERD (gastroesophageal reflux disease) No date: RA (rheumatoid arthritis) (HCC) No date: Rheumatoid arthritis (HCC)  Past Surgical History: 10/11/2020: ABDOMINAL EXPOSURE; N/A     Comment:  Procedure: ABDOMINAL EXPOSURE;  Surgeon: Gretta Lonni PARAS, MD;  Location: [facility] OR;  Service: Vascular;               Laterality: N/A; 10/11/2020: ANTERIOR LUMBAR FUSION; N/A     Comment:  Procedure: ANTERIOR LUMBAR INTERBODY FUSION LUMBAR 4 -               LUMBAR 5 WITH POSTERIOR SPINAL FUSION WITH               INSTRUMENTATION AND ALLOGRAFT;  Surgeon: Beuford Anes,               MD;  Location: [facility] OR;  Service: Orthopedics;  Laterality:              N/A; No date: ATRIAL FIBRILLATION ABLATION No date: OVARIAN CYST REMOVAL  BMI    Body Mass Index: 19.01 kg/m      Reproductive/Obstetrics negative OB ROS                               Anesthesia Physical Anesthesia Plan  ASA: 3 and emergent  Anesthesia Plan: General ETT, Rapid Sequence and Cricoid Pressure   Post-op Pain Management:    Induction: Intravenous  PONV Risk Score and Plan: Ondansetron , Dexamethasone , Midazolam  and Treatment may vary due to age or medical condition  Airway Management Planned: Oral ETT  Additional Equipment:   Intra-op Plan:   Post-operative Plan: Extubation in OR  Informed Consent: I have reviewed the patients History and Physical, chart, labs and discussed the procedure including the risks, benefits and alternatives for the proposed anesthesia with the patient or authorized representative who has indicated his/her understanding and acceptance.     Dental Advisory Given  Plan Discussed with: Anesthesiologist, CRNA and Surgeon  Anesthesia Plan Comments: (Dr. Tobie would like to proceed emergently with this case  Patient consented for risks of anesthesia including but not limited to:  - adverse reactions to medications - damage to eyes, teeth, lips or other oral mucosa - nerve damage due to positioning  - sore throat or hoarseness - Damage to heart, brain, nerves, lungs, other parts of body or loss of life  Patient voiced  understanding and assent.)         Anesthesia Quick Evaluation

## 2024-02-18 NOTE — Progress Notes (Signed)
  PROGRESS NOTE    Jenna Davis  FMW:[id number] DOB: 10-18-1955 DOA: 02/15/2024 PCP: Jenna Batter A, PA  [id number]  LOS: 2 days   Brief hospital course:   Assessment & Plan: Jenna Davis is Davis 68 y.o. 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 - Ucx E. Coli, sensitivities reviewed --cont ceftriaxone  Possible pancreatitis --lipase 107. --advance to low-fat diet  Acute right shoulder pain - Patient in excruciating pain of the right shoulder, states started suddenly after she had an episode of emesis and heard Davis pop - X-ray right shoulder stat - Pain management (oxycodone  po, IV Dilaudid ), ice pack  AKI (acute kidney injury) - stop IV fluids, encourage po hydration  Cyclical vomiting syndrome Functional abdominal pain Recently placed on Haldol  and dicyclomine  Bowel regimen for constipation --cont home haldol   RA (rheumatoid arthritis) (HCC) Chronic immunosuppressive therapy-methotrexate Hold methotrexate for now No evidence of acute flare  Chronic back pain Chronic neuropathic pain On chronic opiates --has current Rx for tramadol and gabapentin   Tobacco use disorder --nicotine patch   DVT prophylaxis: Lovenox SQ Code Status: Full code  Family Communication:  Level of care: Med-Surg Dispo:   The patient is from: home Anticipated d/c is to: home Anticipated d/c date is: tomorrow   Subjective and  Interval History:  Patient was feeling much better this morning, continue to have episode of emesis. Later in the evening, started having excruciating right shoulder pain -pending x-rays stat   Objective: Vitals:   02/17/24 1516 02/17/24 1925 02/18/24 0400 02/18/24 0732  BP: 117/86 132/82 (!) 151/109 (!) 173/102  Pulse: 75 76 92 83  Resp: 17 15 20 18   Temp: 97.9 F (36.6 C) 98.2 F (36.8 C) 98.7 F (37.1 C) 98 F (36.7 C)  TempSrc:   Oral   SpO2: 100% 99% 100% 100%  Weight:      Height:        Intake/Output Summary (Last 24 hours) at 02/18/2024 0941 Last data filed at 02/18/2024 0200 Gross per 24 hour  Intake 1696.84 ml  Output 1 ml  Net 1695.84 ml   Filed Weights   02/15/24 2104 02/16/24 0442  Weight: 55.8 kg 54.3 kg    Examination:   Constitutional: NAD, AAOx3 HEENT: conjunctivae and lids normal, EOMI CV: No cyanosis.   RESP: normal respiratory effort, on RA Neuro: II - XII grossly intact.   Psych: better mood and affect.  Appropriate judgement and reason   Data Reviewed: I have personally reviewed labs and imaging studies   Laree Lock, MD [facility] If 7PM-7AM, please contact night-coverage 02/18/2024, 9:41 AM

## 2024-02-19 ENCOUNTER — Encounter: Payer: Self-pay | Admitting: Orthopedic Surgery

## 2024-02-19 DIAGNOSIS — N10 Acute tubulo-interstitial nephritis: Secondary | ICD-10-CM | POA: Diagnosis not present

## 2024-02-19 LAB — BASIC METABOLIC PANEL WITH GFR
Anion gap: 8 (ref 5–15)
BUN: 10 mg/dL (ref 8–23)
CO2: 25 mmol/L (ref 22–32)
Calcium: 8.8 mg/dL — ABNORMAL LOW (ref 8.9–10.3)
Chloride: 107 mmol/L (ref 98–111)
Creatinine, Ser: 1.11 mg/dL — ABNORMAL HIGH (ref 0.44–1.00)
GFR, Estimated: 55 mL/min — ABNORMAL LOW (ref >60)
Glucose, Bld: 123 mg/dL — ABNORMAL HIGH (ref 70–99)
Potassium: 4.2 mmol/L (ref 3.5–5.1)
Sodium: 140 mmol/L (ref 135–145)

## 2024-02-19 LAB — CBC
HCT: 37 % (ref 36.0–46.0)
Hemoglobin: 12 g/dL (ref 12.0–15.0)
MCH: 31.6 pg (ref 26.0–34.0)
MCHC: 32.4 g/dL (ref 30.0–36.0)
MCV: 97.4 fL (ref 80.0–100.0)
Platelets: 385 K/uL (ref 150–400)
RBC: 3.8 MIL/uL — ABNORMAL LOW (ref 3.87–5.11)
RDW: 14.6 % (ref 11.5–15.5)
WBC: 6.2 K/uL (ref 4.0–10.5)
nRBC: 0 % (ref 0.0–0.2)

## 2024-02-19 MED ORDER — CEPHALEXIN 500 MG PO CAPS: 500.0000 mg | ORAL_CAPSULE | Freq: Two times a day (BID) | ORAL | Status: DC

## 2024-02-19 MED ORDER — SENNOSIDES-DOCUSATE SODIUM 8.6-50 MG PO TABS: 2.0000 | ORAL_TABLET | Freq: Two times a day (BID) | ORAL | Status: DC | PRN

## 2024-02-19 MED ORDER — NICOTINE 21 MG/24HR TD PT24: 21.0000 mg | MEDICATED_PATCH | Freq: Every day | TRANSDERMAL | 1 refills | Status: AC

## 2024-02-19 MED ORDER — ACETAMINOPHEN 325 MG PO TABS: 650.0000 mg | ORAL_TABLET | Freq: Four times a day (QID) | ORAL | 0 refills | Status: AC | PRN

## 2024-02-19 MED ORDER — POLYETHYLENE GLYCOL 3350 17 G PO PACK: 17.0000 g | PACK | Freq: Two times a day (BID) | ORAL | 1 refills | Status: AC

## 2024-02-19 MED ORDER — CEPHALEXIN 500 MG PO CAPS: 500.0000 mg | ORAL_CAPSULE | Freq: Two times a day (BID) | ORAL | 0 refills | Status: AC

## 2024-02-19 MED ORDER — LIDOCAINE 5 % EX PTCH: 2.0000 | MEDICATED_PATCH | CUTANEOUS | 0 refills | Status: AC

## 2024-02-19 MED ORDER — OTREXUP 25 MG/0.4ML ~~LOC~~ SOAJ: 25.0000 mg | SUBCUTANEOUS | Status: AC

## 2024-02-19 MED ORDER — ONDANSETRON HCL 4 MG PO TABS: 4.0000 mg | ORAL_TABLET | Freq: Four times a day (QID) | ORAL | 0 refills | Status: AC | PRN

## 2024-02-19 MED ORDER — TRAMADOL HCL 50 MG PO TABS: 50.0000 mg | ORAL_TABLET | Freq: Three times a day (TID) | ORAL | 0 refills | Status: AC | PRN

## 2024-02-19 NOTE — Discharge Summary (Signed)
 Physician Discharge Summary   Patient: Jenna Davis MRN: [id number] DOB: [birth year]  Admit date:     02/15/2024  Discharge date: 02/19/24  Discharge Physician: Laree Lock   PCP: Rod Grant LABOR, PA   Recommendations at discharge:   Follow-up with PCP in 1 week - repeat BMP (Creatinine), CBC  Follow-up with orthopedics at [facility] in 2 weeks RUE nonweightbearing, immobilizer at all times  Discharge Diagnoses: Principal Problem:   Acute pyelonephritis Active Problems:   AKI (acute kidney injury)   Cyclical vomiting syndrome   RA (rheumatoid arthritis) (HCC)   Immunosuppressed status   Chronic back pain   Chronic prescription opiate use   Neuropathy   Tobacco use disorder   Hospital Course: Jenna Davis is a 68 y.o. female with medical history significant for  functional abdominal pain,rheumatoid arthritis on methotrexate, chronic low back pain, idiopathic neuropathy on chronic opiates, cyclical vomiting syndrome recently started on oral Haldol  and dicyclomine  by PCP on 9/16 with recommendation to discontinue marijuana use, who presented with back pain and intractable left-sided abdominal pain associated with nausea and vomiting.  Admitted for acute pyelonephritis, AKI, cyclical vomiting syndrome which improved.  Hospital course complicated by right glenohumeral dislocation s/p closed reduction on 10/01   Acute pyelonephritis --CT a/p showed Subtle striated nephrogram appearance of the bilateral kidneys on delayed imaging, right greater than left, which may reflect pyelonephritis. - Ucx E. Coli, sensitivities reviewed - was on IV ceftriaxone, discharged on po cephalexin to complete 7 days   Possible pancreatitis --lipase 107. --advance to low-fat diet, tolerating well   Right shoulder (glenohumeral) dislocation - s/p closed reduction by Ortho 10/01 - NWB RUE, Immobilizer at all times - Follow up outpatient in 2 weeks   AKI (acute kidney injury) - improving  s/p IV fluids, encourage po hydration - Follow up outpatient for repeat BMP   Cyclical vomiting syndrome Chronic abdominal hernia Home dicyclomine , Zofran  prn Bowel regimen for constipation Counseled against use of THC   RA (rheumatoid arthritis) (HCC) Chronic immunosuppressive therapy-methotrexate No evidence of acute flare   Chronic back pain Chronic neuropathic pain On chronic opiates --has current Rx for tramadol and gabapentin    Tobacco use disorder --nicotine patch --Counseled cessation  Consultants: Orthopedic surgery Procedures performed: Closed reduction Right shoulder  Disposition: Home Diet recommendation:  Discharge Diet Orders (From admission, onward)     Start     Ordered   02/19/24 0000  Diet - low sodium heart healthy        02/19/24 1332            DISCHARGE MEDICATION: Allergies as of 02/19/2024   No Known Allergies      Medication List     STOP taking these medications    oxyCODONE -acetaminophen  5-325 MG tablet Commonly known as: PERCOCET/ROXICET       TAKE these medications    acetaminophen  325 MG tablet Commonly known as: TYLENOL  Take 2 tablets (650 mg total) by mouth every 6 (six) hours as needed for mild pain (pain score 1-3) (or Fever >/= 101).   alendronate 70 MG tablet Commonly known as: FOSAMAX Take 70 mg by mouth once a week.   buPROPion  150 MG 24 hr tablet Commonly known as: WELLBUTRIN  XL Take 150 mg by mouth every morning.   cephALEXin 500 MG capsule Commonly known as: KEFLEX Take 1 capsule (500 mg total) by mouth every 12 (twelve) hours for 3 days.   cyclobenzaprine  5 MG tablet Commonly known as: FLEXERIL  Take 5-10 mg  by mouth at bedtime as needed for pain.   D3 PO Take 1 capsule by mouth daily.   dicyclomine  20 MG tablet Commonly known as: BENTYL  Take 20 mg by mouth 4 (four) times daily as needed for other. Stomach cramps   folic acid  1 MG tablet Commonly known as: FOLVITE  Take 1 mg by mouth daily.    gabapentin  300 MG capsule Commonly known as: NEURONTIN  Take 900 mg by mouth 3 (three) times daily.   haloperidol  0.5 MG tablet Commonly known as: HALDOL  Take 0.5 mg by mouth every morning.   hydrOXYzine  10 MG tablet Commonly known as: ATARAX  Take 10 mg by mouth 3 (three) times daily as needed for itching.   lidocaine  5 % Commonly known as: LIDODERM  Place 2 patches onto the skin daily. Remove & Discard patch within 12 hours or as directed by MD   methocarbamol  500 MG tablet Commonly known as: ROBAXIN  Take 1 tablet (500 mg total) by mouth every 6 (six) hours as needed for muscle spasms.   nicotine 21 mg/24hr patch Commonly known as: NICODERM CQ - dosed in mg/24 hours Place 1 patch (21 mg total) onto the skin daily. Start taking on: February 20, 2024   ondansetron  4 MG tablet Commonly known as: ZOFRAN  Take 1 tablet (4 mg total) by mouth every 6 (six) hours as needed for nausea.   Otrexup 25 MG/0.4ML Soaj Generic drug: Methotrexate (PF) Inject 25 mg into the skin once a week. Start taking on: February 23, 2024   polyethylene glycol 17 g packet Commonly known as: MIRALAX  / GLYCOLAX  Take 17 g by mouth 2 (two) times daily. What changed: when to take this   traMADol 50 MG tablet Commonly known as: ULTRAM Take 50 mg by mouth every 8 (eight) hours as needed for pain. What changed: Another medication with the same name was added. Make sure you understand how and when to take each.   traMADol 50 MG tablet Commonly known as: ULTRAM Take 1 tablet (50 mg total) by mouth every 8 (eight) hours as needed for moderate pain (pain score 4-6). What changed: You were already taking a medication with the same name, and this prescription was added. Make sure you understand how and when to take each.        Follow-up Information     Jenna Boas, PA-C Follow up in 2 week(s).   Specialty: Orthopedic Surgery Why: X-rays of the right shoulder Contact information: [address] [place] [address] [phone]                Discharge Exam: Jenna Davis   02/15/24 2104 02/16/24 0442  Weight: 55.8 kg 54.3 kg   Constitutional: NAD, AAOx3 CV: No cyanosis.   RESP: normal respiratory effort, on RA Msk: RUE sling Abd: soft, non tender Neuro: II - XII grossly intact.   Psych: better mood and affect.  Appropriate judgement and reason  Condition at discharge: good  The results of significant diagnostics from this hospitalization (including imaging, microbiology, ancillary and laboratory) are listed below for reference.   Imaging Studies: DG Shoulder Right Result Date: 02/18/2024 CLINICAL DATA:  [id number]. Right shoulder dislocation, closed reduction with fluoroscopic guidance. DAP: 8.05 uGy * m2. Ref. Air kerma: 0.4 mGy. Fluoro time: 0.12 seconds. EXAM: RIGHT SHOULDER - 2 FLUOROSCOPIC SPOT VIEWS COMPARISON:  Right shoulder three-view study earlier today. FINDINGS: Right shoulder anterior glenohumeral dislocation was successfully reduced under fluoroscopy. Mild features of arthrosis of the [facility] and  glenohumeral joints are again shown. No obvious fracture is evident on the limited fluoroscopic views obtained. IMPRESSION: Successful reduction of right shoulder anterior glenohumeral dislocation under fluoroscopy. Electronically Signed   By: Francis Quam M.D.   On: 02/18/2024 22:36   DG C-Arm 1-60 Min-No Report Result Date: 02/18/2024 Fluoroscopy was utilized by the requesting physician.  No radiographic interpretation.   DG Shoulder Right Result Date: 02/18/2024 CLINICAL DATA:  Shoulder pain EXAM: RIGHT SHOULDER - 2+ VIEW COMPARISON:  11/18/2019 FINDINGS: Anterior inferior dislocation of the humeral head with respect to the glenoid fossa. Mild AC joint degenerative change. No definite acute displaced fracture. IMPRESSION: Anterior inferior shoulder dislocation. These results will be called to the ordering clinician or representative by the  Radiologist Assistant, and communication documented in the PACS or [employer]. Electronically Signed   By: Luke Bun M.D.   On: 02/18/2024 19:27   DG Abd 1 View Result Date: 02/18/2024 EXAM: 1 VIEW XRAY OF THE ABDOMEN 02/18/2024 07:11:24 PM COMPARISON: CT abdomen and pelvis 02/15/2024. CLINICAL HISTORY: Vomiting and abdominal pain. FINDINGS: BOWEL: Mild stool burden in the descending colon. Nonobstructive bowel gas pattern. SOFT TISSUES: No opaque urinary calculi. BONES: Degenerative changes of the lumbar spine and lower lumbar fusion hardware. No acute osseous abnormality. IMPRESSION: 1. No bowel obstruction. 2. Mild stool burden in the descending colon. Electronically signed by: Donnice Mania MD 02/18/2024 07:23 PM EDT RP Workstation: [id number]   CT ABDOMEN PELVIS W CONTRAST Result Date: 02/15/2024 CLINICAL DATA:  Nausea/vomiting/diarrhea for 1 week EXAM: CT ABDOMEN AND PELVIS WITH CONTRAST TECHNIQUE: Multidetector CT imaging of the abdomen and pelvis was performed using the standard protocol following bolus administration of intravenous contrast. RADIATION DOSE REDUCTION: This exam was performed according to the departmental dose-optimization program which includes automated exposure control, adjustment of the mA and/or kV according to patient size and/or use of iterative reconstruction technique. CONTRAST:  75mL OMNIPAQUE IOHEXOL 300 MG/ML  SOLN COMPARISON:  10/16/2020 FINDINGS: Lower chest: Dependent hypoventilatory changes. No acute pleural or parenchymal lung disease. Moderate hiatal hernia. Hepatobiliary: Gallbladder is markedly distended, with no evidence of gallbladder wall thickening, pericholecystic fluid, or gallstones. Liver is unremarkable. No biliary duct dilation. Pancreas: Unremarkable. No pancreatic ductal dilatation or surrounding inflammatory changes. Spleen: Normal in size without focal abnormality. Adrenals/Urinary Tract: Adrenals are stable. No urinary tract calculi or  obstructive uropathy within either kidney. Subtle striated nephrogram appearance of the bilateral kidneys on delayed imaging, right greater than left, which may reflect pyelonephritis. Please correlate with urinalysis. The bladder is unremarkable. Stomach/Bowel: No bowel obstruction or ileus. The appendix, if still present, is not well visualized. No bowel wall thickening or inflammatory change. Vascular/Lymphatic: Aortic atherosclerosis. No enlarged abdominal or pelvic lymph nodes. Reproductive: Uterus and bilateral adnexa are unremarkable. Other: No free fluid or free intraperitoneal gas. No abdominal wall hernia. Musculoskeletal: No acute or destructive bony abnormalities. Postsurgical changes at the L4-5 level. Reconstructed images demonstrate no additional findings. IMPRESSION: 1. Subtle striated nephrogram appearance of the bilateral kidneys, which could reflect pyelonephritis. Please correlate with urinalysis. 2. Marked gallbladder distension, with no evidence of cholelithiasis or cholecystitis. 3. Moderate hiatal hernia. 4.  Aortic Atherosclerosis (ICD10-I70.0). Electronically Signed   By: Ozell Daring M.D.   On: 02/15/2024 23:02    Microbiology: Results for orders placed or performed during the hospital encounter of 02/15/24  Resp panel by RT-PCR (RSV, Flu A&B, Covid) Anterior Nasal Swab     Status: None   Collection Time: 02/15/24 10:00 PM   Specimen:  Anterior Nasal Swab  Result Value Ref Range Status   SARS Coronavirus 2 by RT PCR NEGATIVE NEGATIVE Final    Comment: (NOTE) SARS-CoV-2 target nucleic acids are NOT DETECTED.  The SARS-CoV-2 RNA is generally detectable in upper respiratory specimens during the acute phase of infection. The lowest concentration of SARS-CoV-2 viral copies this assay can detect is 138 copies/mL. A negative result does not preclude SARS-Cov-2 infection and should not be used as the sole basis for treatment or other patient management decisions. A negative  result may occur with  improper specimen collection/handling, submission of specimen other than nasopharyngeal swab, presence of viral mutation(s) within the areas targeted by this assay, and inadequate number of viral copies(<138 copies/mL). A negative result must be combined with clinical observations, patient history, and epidemiological information. The expected result is Negative.  Fact Sheet for Patients:  BloggerCourse.com  Fact Sheet for Healthcare Providers:  SeriousBroker.it  This test is no t yet approved or cleared by the [place]  FDA and  has been authorized for detection and/or diagnosis of SARS-CoV-2 by FDA under an Emergency Use Authorization (EUA). This EUA will remain  in effect (meaning this test can be used) for the duration of the COVID-19 declaration under Section 564(b)(1) of the Act, 21 U.S.C.section 360bbb-3(b)(1), unless the authorization is terminated  or revoked sooner.       Influenza A by PCR NEGATIVE NEGATIVE Final   Influenza B by PCR NEGATIVE NEGATIVE Final    Comment: (NOTE) The Xpert Xpress SARS-CoV-2/FLU/RSV plus assay is intended as an aid in the diagnosis of influenza from Nasopharyngeal swab specimens and should not be used as a sole basis for treatment. Nasal washings and aspirates are unacceptable for Xpert Xpress SARS-CoV-2/FLU/RSV testing.  Fact Sheet for Patients: BloggerCourse.com  Fact Sheet for Healthcare Providers: SeriousBroker.it  This test is not yet approved or cleared by the [place]  FDA and has been authorized for detection and/or diagnosis of SARS-CoV-2 by FDA under an Emergency Use Authorization (EUA). This EUA will remain in effect (meaning this test can be used) for the duration of the COVID-19 declaration under Section 564(b)(1) of the Act, 21 U.S.C. section 360bbb-3(b)(1), unless the authorization is  terminated or revoked.     Resp Syncytial Virus by PCR NEGATIVE NEGATIVE Final    Comment: (NOTE) Fact Sheet for Patients: BloggerCourse.com  Fact Sheet for Healthcare Providers: SeriousBroker.it  This test is not yet approved or cleared by the [place]  FDA and has been authorized for detection and/or diagnosis of SARS-CoV-2 by FDA under an Emergency Use Authorization (EUA). This EUA will remain in effect (meaning this test can be used) for the duration of the COVID-19 declaration under Section 564(b)(1) of the Act, 21 U.S.C. section 360bbb-3(b)(1), unless the authorization is terminated or revoked.  Performed at [facility], [address], [place], [place] [address]   Urine Culture     Status: Abnormal   Collection Time: 02/16/24 12:20 AM   Specimen: Urine, Clean Catch  Result Value Ref Range Status   Specimen Description   Final    URINE, CLEAN CATCH Performed at [facility], [address] [place], [place] [address]    Special Requests   Final    NONE Performed at [facility], [address] [place], [place] [address]    Culture >=100,000 COLONIES/mL ESCHERICHIA COLI (A)  Final   Report Status 02/18/2024 FINAL  Final   Organism ID, Bacteria ESCHERICHIA COLI (A)  Final  Susceptibility   Escherichia coli - MIC*    AMPICILLIN >=32 RESISTANT Resistant     CEFAZOLIN  (URINE) Value in next row Sensitive      2 SENSITIVEThis is a modified FDA-approved test that has been validated and its performance characteristics determined by the reporting laboratory.  This laboratory is certified under the Clinical Laboratory Improvement Amendments CLIA as qualified to perform high complexity clinical laboratory testing.    CEFEPIME  Value in next row Sensitive      2 SENSITIVEThis is a modified FDA-approved test that has been validated and its performance characteristics determined by the reporting  laboratory.  This laboratory is certified under the Clinical Laboratory Improvement Amendments CLIA as qualified to perform high complexity clinical laboratory testing.    ERTAPENEM Value in next row Sensitive      2 SENSITIVEThis is a modified FDA-approved test that has been validated and its performance characteristics determined by the reporting laboratory.  This laboratory is certified under the Clinical Laboratory Improvement Amendments CLIA as qualified to perform high complexity clinical laboratory testing.    CEFTRIAXONE Value in next row Sensitive      2 SENSITIVEThis is a modified FDA-approved test that has been validated and its performance characteristics determined by the reporting laboratory.  This laboratory is certified under the Clinical Laboratory Improvement Amendments CLIA as qualified to perform high complexity clinical laboratory testing.    CIPROFLOXACIN Value in next row Sensitive      2 SENSITIVEThis is a modified FDA-approved test that has been validated and its performance characteristics determined by the reporting laboratory.  This laboratory is certified under the Clinical Laboratory Improvement Amendments CLIA as qualified to perform high complexity clinical laboratory testing.    GENTAMICIN Value in next row Sensitive      2 SENSITIVEThis is a modified FDA-approved test that has been validated and its performance characteristics determined by the reporting laboratory.  This laboratory is certified under the Clinical Laboratory Improvement Amendments CLIA as qualified to perform high complexity clinical laboratory testing.    NITROFURANTOIN Value in next row Sensitive      2 SENSITIVEThis is a modified FDA-approved test that has been validated and its performance characteristics determined by the reporting laboratory.  This laboratory is certified under the Clinical Laboratory Improvement Amendments CLIA as qualified to perform high complexity clinical laboratory testing.     TRIMETH/SULFA Value in next row Sensitive      2 SENSITIVEThis is a modified FDA-approved test that has been validated and its performance characteristics determined by the reporting laboratory.  This laboratory is certified under the Clinical Laboratory Improvement Amendments CLIA as qualified to perform high complexity clinical laboratory testing.    AMPICILLIN/SULBACTAM Value in next row Intermediate      2 SENSITIVEThis is a modified FDA-approved test that has been validated and its performance characteristics determined by the reporting laboratory.  This laboratory is certified under the Clinical Laboratory Improvement Amendments CLIA as qualified to perform high complexity clinical laboratory testing.    PIP/TAZO Value in next row Sensitive      <=4 SENSITIVEThis is a modified FDA-approved test that has been validated and its performance characteristics determined by the reporting laboratory.  This laboratory is certified under the Clinical Laboratory Improvement Amendments CLIA as qualified to perform high complexity clinical laboratory testing.    MEROPENEM Value in next row Sensitive      <=4 SENSITIVEThis is a modified FDA-approved test that has been validated and its performance characteristics determined by the  reporting laboratory.  This laboratory is certified under the Clinical Laboratory Improvement Amendments CLIA as qualified to perform high complexity clinical laboratory testing.    * >=100,000 COLONIES/mL ESCHERICHIA COLI    Labs: CBC: Recent Labs  Lab 02/15/24 2114 02/16/24 0247 02/17/24 0515 02/18/24 0611 02/19/24 0314  WBC 21.5* 18.9* 13.0* 10.0 6.2  HGB 11.3* 13.0 12.6 11.4* 12.0  HCT 34.1* 40.1 38.7 35.8* 37.0  MCV 96.6 98.3 98.2 97.3 97.4  PLT 405* 406* 374 404* 385   Basic Metabolic Panel: Recent Labs  Lab 02/15/24 2114 02/16/24 0247 02/17/24 0515 02/18/24 0611 02/19/24 0314  NA 141 144 142 142 140  K 3.8 4.0 4.0 4.0 4.2  CL 112* 110 110 110 107  CO2 22  19* 25 24 25   GLUCOSE 125* 108* 106* 112* 123*  BUN 24* 22 14 9 10   CREATININE 1.53* 1.45* 1.30* 1.03* 1.11*  CALCIUM 8.5* 8.9 8.7* 8.7* 8.8*  MG  --   --  2.1  --   --    Liver Function Tests: Recent Labs  Lab 02/15/24 2114  AST 14*  ALT 7  ALKPHOS 82  BILITOT 0.5  PROT 6.7  ALBUMIN  3.2*   CBG: No results for input(s): GLUCAP in the last 168 hours.  Discharge time spent: greater than 30 minutes.  Signed: Laree Lock, MD [facility] 02/19/2024

## 2024-02-19 NOTE — Progress Notes (Signed)
  Subjective: 1 Day Post-Op Procedure(s) (LRB): CLOSED REDUCTION, SHOULDER (Right) Patient reports pain as mild.   Patient is well, and has had no acute complaints or problems Plan is to go Home after hospital stay. Negative for chest pain and shortness of breath Fever: no Gastrointestinal: Negative for nausea and vomiting  Objective: Vital signs in last 24 hours: Temp:  [97.2 F (36.2 C)-99.9 F (37.7 C)] 98.8 F (37.1 C) (10/02 0226) Pulse Rate:  [69-100] 74 (10/02 0226) Resp:  [15-22] 18 (10/02 0226) BP: (103-189)/(80-121) 143/96 (10/02 0226) SpO2:  [92 %-100 %] 100 % (10/02 0226)  Intake/Output from previous day:  Intake/Output Summary (Last 24 hours) at 02/19/2024 0728 Last data filed at 02/18/2024 2340 Gross per 24 hour  Intake 485 ml  Output 600 ml  Net -115 ml    Intake/Output this shift: No intake/output data recorded.  Labs: Recent Labs    02/17/24 0515 02/18/24 0611 02/19/24 0314  HGB 12.6 11.4* 12.0   Recent Labs    02/18/24 0611 02/19/24 0314  WBC 10.0 6.2  RBC 3.68* 3.80*  HCT 35.8* 37.0  PLT 404* 385   Recent Labs    02/18/24 0611 02/19/24 0314  NA 142 140  K 4.0 4.2  CL 110 107  CO2 24 25  BUN 9 10  CREATININE 1.03* 1.11*  GLUCOSE 112* 123*  CALCIUM 8.7* 8.8*   No results for input(s): LABPT, INR in the last 72 hours.   EXAM General - Patient is Alert and Oriented Extremity - Neurovascular intact Sensation intact distally Dressing/Incision -shoulder sling in place Motor Function - intact, moving fingers and wrist well on exam.   Past Medical History:  Diagnosis Date   A-fib (HCC)    Depression    GERD (gastroesophageal reflux disease)    RA (rheumatoid arthritis) (HCC)    Rheumatoid arthritis (HCC)     Assessment/Plan: 1 Day Post-Op Procedure(s) (LRB): CLOSED REDUCTION, SHOULDER (Right) Principal Problem:   Acute pyelonephritis Active Problems:   RA (rheumatoid arthritis) (HCC)   Chronic back pain    Immunosuppressed status   Tobacco use disorder   Cyclical vomiting syndrome   Chronic prescription opiate use   Neuropathy   AKI (acute kidney injury)  Estimated body mass index is 19.01 kg/m as calculated from the following:   Height as of this encounter: 5' 6.5 (1.689 m).   Weight as of this encounter: 54.3 kg.  Discharge planning: Nonweightbearing on the right upper extremity.  Shoulder immobilizer on at all times.  Follow-up with [facility] clinic orthopedics in 2 weeks  DVT Prophylaxis - None   Krystal Doyne, PA-C Orthopaedic Surgery 02/19/2024, 7:28 AM

## 2024-02-19 NOTE — Discharge Instructions (Signed)
 INSTRUCTIONS AFTER Surgery  Remove items at home which could result in a fall. This includes throw rugs or furniture in walking pathways ICE to the affected joint every three hours while awake for 30 minutes at a time, for at least the first 3-5 days, and then as needed for pain and swelling.  Continue to use ice for pain and swelling. You may notice swelling that will progress down to the foot and ankle.  This is normal after surgery.  Elevate your leg when you are not up walking on it.   Continue to use the breathing machine you got in the hospital (incentive spirometer) which will help keep your temperature down.  It is common for your temperature to cycle up and down following surgery, especially at night when you are not up moving around and exerting yourself.  The breathing machine keeps your lungs expanded and your temperature down.   DIET:  As you were doing prior to hospitalization, we recommend a well-balanced diet.  DRESSING / WOUND CARE / SHOWERING  Shoulder immobilizer on at all times except for bathing.    ACTIVITY  Increase activity slowly as tolerated, but follow the weight bearing instructions below.   No driving for 6 weeks or until further direction given by your physician.  You cannot drive while taking narcotics.  No lifting or carrying greater than 10 lbs. until further directed by your surgeon. Avoid periods of inactivity such as sitting longer than an hour when not asleep. This helps prevent blood clots.  You may return to work once you are authorized by your doctor.     WEIGHT BEARING  Nonweightbearing on the right upper extremity   EXERCISES No range of motion activities.  CONSTIPATION  Constipation is defined medically as fewer than three stools per week and severe constipation as less than one stool per week.  Even if you have a regular bowel pattern at home, your normal regimen is likely to be disrupted due to multiple reasons following surgery.   Combination of anesthesia, postoperative narcotics, change in appetite and fluid intake all can affect your bowels.   YOU MUST use at least one of the following options; they are listed in order of increasing strength to get the job done.  They are all available over the counter, and you may need to use some, POSSIBLY even all of these options:    Drink plenty of fluids (prune juice may be helpful) and high fiber foods Colace 100 mg by mouth twice a day  Senokot for constipation as directed and as needed Dulcolax (bisacodyl ), take with full glass of water  Miralax  (polyethylene glycol) once or twice a day as needed.  If you have tried all these things and are unable to have a bowel movement in the first 3-4 days after surgery call either your surgeon or your primary doctor.    If you experience loose stools or diarrhea, hold the medications until you stool forms back up.  If your symptoms do not get better within 1 week or if they get worse, check with your doctor.  If you experience the worst abdominal pain ever or develop nausea or vomiting, please contact the office immediately for further recommendations for treatment.   ITCHING:  If you experience itching with your medications, try taking only a single pain pill, or even half a pain pill at a time.  You can also use Benadryl over the counter for itching or also to help with sleep.  TED HOSE STOCKINGS:  Use stockings on both legs until for at least 2 weeks or as directed by physician office. They may be removed at night for sleeping.  MEDICATIONS:  See your medication summary on the "After Visit Summary" that nursing will review with you.  You may have some home medications which will be placed on hold until you complete the course of blood thinner medication.  It is important for you to complete the blood thinner medication as prescribed.  PRECAUTIONS:  If you experience chest pain or shortness of breath - call 911 immediately for  transfer to the hospital emergency department.   If you develop a fever greater that 101 F, purulent drainage from wound, increased redness or drainage from wound, foul odor from the wound/dressing, or calf pain - CONTACT YOUR SURGEON.                                                   FOLLOW-UP APPOINTMENTS:  If you do not already have a post-op appointment, please call the office for an appointment to be seen by your surgeon.  Guidelines for how soon to be seen are listed in your "After Visit Summary", but are typically between 1-4 weeks after surgery.  OTHER INSTRUCTIONS:     MAKE SURE YOU:  Understand these instructions.  Get help right away if you are not doing well or get worse.    Thank you for letting us  be a part of your medical care team.  It is a privilege we respect greatly.  We hope these instructions will help you stay on track for a fast and full recovery!

## 2024-02-19 NOTE — Care Management Important Message (Signed)
 Important Message  Patient Details  Name: Jenna Davis MRN: [id number] Date of Birth: May 06, 1956   Important Message Given:  Yes - Medicare IM     [name], CMA 02/19/2024, 11:18 AM

## 2024-02-19 NOTE — Progress Notes (Addendum)
  Progress Note   Patient: Jenna Davis FMW:[id number] DOB: 04-Dec-1955 DOA: 02/15/2024     3 DOS: the patient was seen and examined on 02/18/2024  CROSS COVER NOTE   Concern as stated by nurse / staff Patient had a pop in the right shoulder whilst having episode of vomiting and I feel patient has dislocated her right shoulder and is in excruciating pain  Pertinent findings on chart review:        Patient currently on admission for acute pyelonephritis also has cyclical vomiting syndrome and functional abdominal pain who this evening started having episode of nausea and vomiting and in the process dislocated this shoulder without any trauma Patient Assessment         Vitals:   02/18/24 2215 02/18/24 2230 02/18/24 2245 02/18/24 2327  BP: (!) 132/93 (!) 152/83 132/82 131/80  Pulse: 79 75 78 77  Resp: 15 16 16 16   Temp:    97.6 F (36.4 C)  TempSrc:    Oral  SpO2: 100% 96% 95% 100%  Weight:      Height:          Latest Ref Rng & Units 02/18/2024    6:11 AM 02/17/2024    5:15 AM 02/16/2024    2:47 AM  CBC  WBC 4.0 - 10.5 K/uL 10.0  13.0  18.9   Hemoglobin 12.0 - 15.0 g/dL 88.5  87.3  86.9   Hematocrit 36.0 - 46.0 % 35.8  38.7  40.1   Platelets 150 - 400 K/uL 404  374  406   X-ray of the right shoulder showed anterior-inferior shoulder dislocation  Assessment and Interventions Plan: 1.   Stat consult placed to orthopedic surgeon and case discussed, plans for surgical intervention underway         2.    Continue as needed pain medication         3.     Abdominal imaging reviewed that did not show any obstruction, findings of chronic abdominal hernia seen on the left side of the abdomen, which is reducible.           Author: Drue ONEIDA Potter, MD 02/19/2024 1:25 AM  For on call review [email or web address].

## 2024-02-20 NOTE — Anesthesia Postprocedure Evaluation (Signed)
 Anesthesia Post Note  Patient: Elleanor Chillemi  Procedure(s) Performed: CLOSED REDUCTION, SHOULDER (Right: Shoulder)  Patient location during evaluation: PACU Anesthesia Type: General Level of consciousness: awake and alert Pain management: pain level controlled Vital Signs Assessment: post-procedure vital signs reviewed and stable Respiratory status: spontaneous breathing, nonlabored ventilation and respiratory function stable Cardiovascular status: blood pressure returned to baseline and stable Postop Assessment: no apparent nausea or vomiting Anesthetic complications: no   No notable events documented.   Last Vitals:  Vitals:   02/19/24 0226 02/19/24 0815  BP: (!) 143/96 138/87  Pulse: 74 75  Resp: 18 16  Temp: 37.1 C 36.8 C  SpO2: 100% 100%    Last Pain:  Vitals:   02/19/24 1205  TempSrc:   PainSc: 4                  Fairy POUR Shantel Wesely

## 2024-02-21 DIAGNOSIS — M5431 Sciatica, right side: Principal | ICD-10-CM

## 2024-02-21 MED ORDER — GABAPENTIN 300 MG CAPSULE
ORAL_CAPSULE | 2 refills | 0.00000 days
Start: 2024-02-21 — End: ?

## 2024-02-23 NOTE — Unmapped (Signed)
 Hi Schedulers,   Can you schedule this pt with myself in the next few weeks?   Thanks,   Ronnald

## 2024-02-24 MED ORDER — GABAPENTIN 300 MG CAPSULE
ORAL_CAPSULE | ORAL | 2 refills | 0.00000 days | Status: CP
Start: 2024-02-24 — End: ?

## 2024-02-24 NOTE — Unmapped (Signed)
 Rheumatology Clinic Follow-up Note     Assessment/Plan:    Bethany Meyer is a 68 y.o.  female with a PMHx of seropositive RA, tobacco use, spinal stenosis who presents today for follow-up of RA.  Assessment & Plan  Rheumatoid arthritis with active disease  Not controlled. Bethany Meyer  approved to by insurance. Discussed Bethany Meyer  side effects, including rare bowel perforation risk.  - Start Bethany Meyer  weekly injections. Provided with pharmacy's number to schedule.   - Update monitoring labs today; CBCw/diff, Cr, AST, ALT  - Administer Depo 40mg  IM for current symptoms.  - Hold off on methotrexate  due to recent nausea  - Encourage smoking cessation.    Right shoulder dislocation, status post reduction  Right shoulder dislocation reduced surgically. Using sling with movement restrictions for recovery.  - Follow up with orthopedics.  - Continue sling use and shoulder elevation.    Osteoporosis  Osteoporosis previously treated with Fosamax  but didn't take consistently. Not currently on treatment.  - Order bone density scan.  - Consider reclast infusion if indicated.    Tobacco use disorder  Continues smoking half a pack daily, exacerbating rheumatoid arthritis.  - Encourage smoking cessation.      F/U in 4 months with myself and 8 months with Dr. Stacia    I personally spent 38 minutes face-to-face and non-face-to-face in the care of this patient, which includes all pre, intra, and post visit time on the date of service.  All documented time was specific to the E/M visit and does not include any procedures that may have been performed.          Primary Care Provider: Rod Grant Penning, PA    HPI:  Bethany Meyer is a 68 y.o.  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    Last Visit: 04/09/23 with Dr. Stacia. At this visit pt was having worsening joint symptoms after not having MTX and infusions. So discussed restarting MTX and switching to Actemra  [place] due to difficulty getting to infusions.     Hospitalized 02/15/24-02/19/24 due to pyelonephritis, also complicated by R glenohumeral dislocation s/p closed reduction 10/1    Today:  History of Present Illness  She experiences ongoing joint pain and swelling, particularly in her hands and feet. Her hands feel as though they are 'drawing' and her feet cramp, with noticeable swelling in her hands. Prednisone  has previously helped reduce the swelling, but symptoms return once the medication is stopped. She last took prednisone  about a month ago and is not currently on any. Methotrexate  was not continued due to insurance issues, although initial nausea with it improved over time. Morning stiffness lasts about 30 to 45 minutes.    In early October, she experienced significant nausea and vomiting, leading to an emergency room visit where she was told there might be a kidney infection. She was started on antibiotics, which she has nearly completed, with one dose remaining. Despite this, she reports persistent weakness and fear of eating. She is taking medication for nausea but is not eating much due to fear of nausea.    During her hospital stay, she experienced a right shoulder dislocation, which occurred unexpectedly while getting out of bed. She underwent minor surgery for this. She has no prior history of shoulder dislocations.    She smokes about half a pack of cigarettes a day, increasing when she feels sick or anxious. She has a  history of osteoporosis and was previously prescribed Fosamax , but she is not currently taking it. She has not had a recent bone density scan.    She reports a fever of 101.2??F during her recent hospital stay but has not had a fever since. No other recent illnesses or infections prior to the hospital visit.    As background  Bethany Meyer's joint pain first started around May/June 2016 with right knee pain and swelling, then right foot and ankle swelling. She is RF negative, CCP positive. She was started on methotrexate  but had persistent joint pain and swelling so was started on Humira in March 2017. She is on methotrexate  20mg  weekly po (have been unable to get SQ approved by insurance).  She was increased to weekly Humira but continued to have joint pain and swelling.  Enbrel  was not approved by insurance so prescription was sent for Xeljanz  instead in 2018. Xeljanz  was initially effective but the patient experienced several flares while on this medication. This was discontinued in May 2019 and she was started on Cimzia  in June 2019. Cimzia  was discontinued in summer 2020 due to lack of perceived benefit from medication. She was on Rituximab  with first infusion 06/2019, she has had sporadic dosing schedule due to issues with transportation and following up.     Ritux 1g 06/2019, 12/30/19, 01/13/20, 12/20/20, 04/30/22. Due to multiple missed infusion appointments, we switched her to Actemra  03/2023. Rituximab  was effective.    Review of Systems:  Positive findings noted above, otherwise a 14 point review of systems was reviewed and negative    Past Medical, Surgical, Family and Social History reviewed and updated per EMR     Allergies:  Patient has no known allergies.    Medications:   Current Medications[1]      Objective   Vitals:    02/25/24 1032   BP: 122/87   BP Position: Sitting   BP Cuff Size: Small   Pulse: 77   Temp: 36.2 ??C (97.2 ??F)   TempSrc: Temporal   Weight: 54 kg (119 lb)       Physical Exam  General: well appearing, no acute distress  Eyes: EOMI, normal conjunctivae   ENT: MMM.  Oropharynx without any erythema or exudate.  No oral or nasal ulcers.  Neck: supple. No cervical lymphadenopathy  Cardiovascular: Regular rate and rhythm. Nl S1 and S2.  Pulmonary: Nl WOB on RA, CTAB.  Skin: no rash, lesions, breakdown. No purpura or petechiae. No digital ulcers.   Extremities: Warm and well perfused, no cyanosis, clubbing or edema  Musculoskeletal: Tenderness to b/l 2-3 MCPs and thumbs. No overt swelling. Able to make tight fist b/l. Tender to L wrist w/o swelling. Good ROM of b/l elbow. Good ROM of b/l shoulders.    Neurologic: Cranial nerves grossly intact, strength 5/5 throughout.  Normal sensation  Psychiatric: Normal mood and affect.    Swollen Joint Count (0-28): 0  Tender Joint Count (0-28): 7  Patient Global Assessment of Disease Activity (0-10): 8  Evaluator Global Assessment of Disease (0-10): 4  CDAI Score: 19  CDAI interpretation:  0.0-2.8 Remission   2.9-10.0 Low disease activity   10.1-22.0 Moderate disease activity   22.1-76.0 High disease activity         WBC  4.0 - 10.5 K/uL 6.2    RBC  3.87 - 5.11 MIL/uL 3.80 Low     Hemoglobin  12.0 - 15.0 g/dL 87.9    HCT  63.9 - 53.9 % 37.0  MCV  80.0 - 100.0 fL 97.4    MCH  26.0 - 34.0 pg 31.6    MCHC  30.0 - 36.0 g/dL 67.5    RDW  88.4 - 84.4 % 14.6    Platelets  150 - 400 K/uL 385    nRBC  0.0 - 0.2 % 0.0 Performed at [facility], [address] [place], [place] [address]   Resulting Agency [facility]    Specimen Collected: 02/19/24 03:14    Performed by: [facility] Last Resulted: 02/19/24 03:44   Received From: [facility]  Result Received: 02/25/24 11:00          [1]   Current Outpatient Medications:     acetaminophen  (TYLENOL ) 325 MG tablet, Take 1 tablet (325 mg total) by mouth two (2) times a day as needed for pain., Disp: , Rfl:     alendronate  (FOSAMAX ) 70 MG tablet, Take 1 tablet (70 mg total) by mouth every seven (7) days., Disp: 12 tablet, Rfl: 3    arm brace Misc, Boxer splint, metacarpal wrist brace, Disp: 1 each, Rfl: 0    aspirin  81 MG chewable tablet, Chew 1 tablet (81 mg total) before bedtime., Disp: 36 tablet, Rfl: 3    buPROPion  (WELLBUTRIN  XL) 150 MG 24 hr tablet, Take 1 tablet (150 mg total) by mouth every morning., Disp: 90 tablet, Rfl: 0    dicyclomine  (BENTYL ) 20 mg tablet, Take 1 tablet (20 mg total) by mouth four times a day as needed (stomach cramps)., Disp: 360 tablet, Rfl: 3    empty container Misc, Use as directed, Disp: 1 each, Rfl: 2    folic acid  (FOLVITE ) 1 MG tablet, Take 1 tablet (1,000 mcg total) by mouth daily., Disp: 90 tablet, Rfl: 3    gabapentin  (NEURONTIN ) 300 MG capsule, 4 CAPSULES THREE TIMES DAILY. INSURANCE WILL ONLY COVER 9 CAPSULES PER DAY, Disp: 360 capsule, Rfl: 2    haloperidol  (HALDOL ) 1 MG tablet, Take 1 tablet (1 mg total) by mouth Three (3) times a day., Disp: 90 tablet, Rfl: 2    hydrOXYzine  (ATARAX ) 10 MG tablet, Take 1 tablet (10 mg total) by mouth every six (6) hours as needed for itching., Disp: 270 tablet, Rfl: 1    leg brace (KNEE SUPPORT BRACE) Misc, 1 each by Miscellaneous route daily as needed., Disp: 1 each, Rfl: 0    methotrexate , PF, (RASUVO , PF,) 25 mg/0.5 mL [name], Inject the contents of 1 auto-injector (25 mg total) under the skin every seven (7) days., Disp: 2 mL, Rfl: 1    naloxone  (NARCAN ) 4 mg nasal spray, One spray in either nostril once for known/suspected opioid overdose. May repeat every 2-3 minutes in alternating nostril til EMS arrives, Disp: 2 each, Rfl: 0    needle, disp, 25 gauge 25 gauge x 5/8 Ndle, For methotrexate  injections, Disp: 100 each, Rfl: 0    omeprazole  (PRILOSEC) 20 MG capsule, Take 1 capsule (20 mg total) by mouth daily., Disp: 100 capsule, Rfl: 3    ondansetron  (ZOFRAN ) 4 MG tablet, Take 1 tablet (4 mg total) by mouth every six (6) hours as needed for nausea., Disp: , Rfl:     polyethylene glycol (GLYCOLAX ) 17 gram/dose powder, Take 17 g by mouth daily., Disp: 1700 g, Rfl: 2    predniSONE  (DELTASONE ) 5 MG tablet, TAKE 1 TABLET BY MOUTH DAILY, Disp: 30 tablet, Rfl: 1    SYMBICORT  80-4.5 mcg/actuation inhaler, INHALE 2 PUFFS IN THE MORNING AND  IN THE EVENING, Disp: , Rfl:     tocilizumab -aazg (Bethany Meyer  AUTOINJECTOR) 162 mg/0.9 mL PnIj, Inject the contents of 1 pen (162 mg) under the skin once a week., Disp: 10.8 mL, Rfl: 3    traMADol  (ULTRAM ) 50 mg tablet, Take 1 tablet (50 mg total) by mouth every six (6) hours as needed for pain., Disp: 120 tablet, Rfl: 0    umeclidinium (INCRUSE ELLIPTA ) 62.5 mcg/actuation inhaler, Inhale 1 puff daily., Disp: 30 each, Rfl: 2    VITAMIN B-1 100 mg tablet, Take 1 tablet (100 mg total) by mouth daily., Disp: 100 tablet, Rfl: 3  No current facility-administered medications for this visit.

## 2024-02-25 ENCOUNTER — Ambulatory Visit: Admit: 2024-02-25 | Discharge: 2024-02-25 | Payer: Medicare (Managed Care)

## 2024-02-25 ENCOUNTER — Ambulatory Visit: Admit: 2024-02-25 | Discharge: 2024-02-25 | Payer: Medicare (Managed Care) | Attending: Family | Primary: Family

## 2024-02-25 DIAGNOSIS — M069 Rheumatoid arthritis, unspecified: Principal | ICD-10-CM

## 2024-02-25 DIAGNOSIS — Z23 Encounter for immunization: Principal | ICD-10-CM

## 2024-02-25 DIAGNOSIS — Z79631 Methotrexate, long term, current use: Principal | ICD-10-CM

## 2024-02-25 DIAGNOSIS — M810 Age-related osteoporosis without current pathological fracture: Principal | ICD-10-CM

## 2024-02-25 LAB — CBC W/ AUTO DIFF
BASOPHILS ABSOLUTE COUNT: 0.1 10*9/L (ref 0.0–0.1)
BASOPHILS RELATIVE PERCENT: 1 %
EOSINOPHILS ABSOLUTE COUNT: 0.3 10*9/L (ref 0.0–0.5)
EOSINOPHILS RELATIVE PERCENT: 4.2 %
HEMATOCRIT: 39.4 % (ref 34.0–44.0)
HEMOGLOBIN: 13.1 g/dL (ref 11.3–14.9)
LYMPHOCYTES ABSOLUTE COUNT: 2.8 10*9/L (ref 1.1–3.6)
LYMPHOCYTES RELATIVE PERCENT: 42.5 %
MEAN CORPUSCULAR HEMOGLOBIN CONC: 33.3 g/dL (ref 32.0–36.0)
MEAN CORPUSCULAR HEMOGLOBIN: 32 pg (ref 25.9–32.4)
MEAN CORPUSCULAR VOLUME: 96 fL — ABNORMAL HIGH (ref 77.6–95.7)
MEAN PLATELET VOLUME: 8.9 fL (ref 6.8–10.7)
MONOCYTES ABSOLUTE COUNT: 0.8 10*9/L (ref 0.3–0.8)
MONOCYTES RELATIVE PERCENT: 12.2 %
NEUTROPHILS ABSOLUTE COUNT: 2.6 10*9/L (ref 1.8–7.8)
NEUTROPHILS RELATIVE PERCENT: 40.1 %
PLATELET COUNT: 255 10*9/L (ref 150–450)
RED BLOOD CELL COUNT: 4.1 10*12/L (ref 3.95–5.13)
RED CELL DISTRIBUTION WIDTH: 15.3 % — ABNORMAL HIGH (ref 12.2–15.2)
WBC ADJUSTED: 6.5 10*9/L (ref 3.6–11.2)

## 2024-02-25 LAB — CREATININE
CREATININE: 1.61 mg/dL — ABNORMAL HIGH (ref 0.55–1.02)
EGFR CKD-EPI (2021) FEMALE: 35 mL/min/1.73m2 — ABNORMAL LOW (ref >=60)

## 2024-02-25 LAB — ALT: ALT (SGPT): 12 U/L (ref 10–49)

## 2024-02-25 LAB — AST: AST (SGOT): 20 U/L (ref <=34)

## 2024-02-25 MED ADMIN — methylPREDNISolone acetate (DEPO-Medrol) injection 40 mg: 40 mg | INTRAMUSCULAR | @ 16:00:00 | Stop: 2024-02-25

## 2024-02-25 NOTE — Unmapped (Addendum)
 [facility] has been trying to reach you to set up shipment of your prescriptions. Please call [phone] Option 4, then Option 2: Dermatology, Gastroenterology, Rheumatology at your earliest convenience.       Tocilizumab  (actemra ) (Information from the [employer] of Rheumatology)    Tocilizumab  (Actemra  ??) is a biologic medication currently approved to treat adults with moderately to severely active rheumatoid arthritis (RA), adults with giant cell arteritis (GCA), and people ages two and above with Polyarticular Juvenile Idiopathic Arthritis (PJIA) or Systemic Juvenile Idiopathic Arthritis (SJIA). Biologic medications are proteins designed by humans that affect the immune system. Tocilizumab  blocks the inflammatory protein IL-6. This improves joint pain and swelling from arthritis and other symptoms caused by inflammation.    How to Take It    For adults with RA or GCA, tocilizumab  can be given as either a monthly intravenous infusion or as a subcutaneous injection every one - two weeks. For people with PJIA or SJIA, tocilizumab  is given as a monthly intravenous infusion. When used as a subcutaneous injection, the medicine can be injected into the thigh or abdomen. The site of injection should be rotated so the same site is not used multiple times. Some patients will start to see improvement within a few weeks, but it may take several months to take full effect. Tocilizumab  may be taken alone or with methotrexate  or other non-biologic drugs. Tocilizumab  should not be given in combination with another biologic drug. Blood tests will be used to monitor for increases in cholesterol or liver enzymes and for reductions in blood cell counts while taking tocilizumab .    Side Effects    Tocilizumab  can lower the ability of your immune system to fight infections. If you develop symptoms of an infection while using this medication, you should stop it and contact your doctor. All patients should be tested for tuberculosis before starting on tocilizumab , although these types of infections have not been frequently seen. Allergic reactions to intravenous tocilizumab  infusions can occur, which can include symptoms such as fever and chills, but these are rare. Tocilizumab  has been associated with increased cholesterol levels in some patients, and should be periodically monitored. If your cholesterol level becomes too high, it is possible you may need to start taking a medication to lower it. A rare complication seen with tocilizumab  use in clinical trials was bowel perforation, or a hole in the bowel wall. If you have a history or diverticulitis or develop abdominal pain or bloody bowel movements while taking tocilizumab , you should notify your doctor immediately.    Tell Your Doctor    You should notify your doctor if you develop symptoms of an infection, such as a fever or cough, or if you think you are having any side effects, especially abdominal pain, bloody bowel movements, or allergic reactions. Notify your doctor if you become pregnant, are planning pregnancy, or if you are breastfeeding. Be sure to talk with your doctor if you are planning to have surgery or if you plan to get any live vaccinations; these include the shingles vaccine, the nasal spray flu vaccine, and others such as the measles, mumps, rubella, and yellow fever vaccines.    Updated July 2017 by Deward Croissant, MD and reviewed by the [facility] of Rheumatology Committee on Communications and Marketing.    This information is provided for general education only. Individuals should consult a qualified health care provider for professional medical advice, diagnosis and treatment of a medical or  health condition.    ?? 2017 [employer]

## 2024-03-01 NOTE — Telephone Encounter (Signed)
 Called pt due to elevated Cr. Pt agrees to repeat labs at local [facility]. Lab orders placed.     Ronnald Hails, FNP

## 2024-03-02 DIAGNOSIS — M5441 Lumbago with sciatica, right side: Principal | ICD-10-CM

## 2024-03-02 DIAGNOSIS — G8929 Other chronic pain: Principal | ICD-10-CM

## 2024-03-02 DIAGNOSIS — M5442 Lumbago with sciatica, left side: Principal | ICD-10-CM

## 2024-03-02 DIAGNOSIS — M069 Rheumatoid arthritis, unspecified: Principal | ICD-10-CM

## 2024-03-02 DIAGNOSIS — M25511 Pain in right shoulder: Principal | ICD-10-CM

## 2024-03-02 DIAGNOSIS — R1115 Cyclical vomiting syndrome unrelated to migraine: Principal | ICD-10-CM

## 2024-03-02 MED ORDER — BUPRENORPHINE 5 MCG/HOUR WEEKLY TRANSDERMAL PATCH
MEDICATED_PATCH | TRANSDERMAL | 5 refills | 28.00000 days | Status: CP
Start: 2024-03-02 — End: 2024-08-17

## 2024-03-02 NOTE — Unmapped (Signed)
 [facility] Follow-up      Bethany Meyer    Opioid pain medication agreement last signed: 12/23/2023    Most recent Naloxone  (NARCAN ) nasal spray Rx sent: 02/03/2024    Last PDMP Review: 02/03/2024 10:22 AM    Last Opioid Dispensed Provider: Thersia Alycia Angle, MD    Recent Visits  Date Type Provider Dept   02/03/24 Office Visit Vien, Thersia Alycia, MD [facility]   12/23/23 Office Visit Vien, Thersia Alycia, MD [facility]   11/06/23 Office Visit Rod Grant Penning, [place] [facility]       Last Drug Screen Date: 07/08/2023  Urine Toxicology Screen    Lab Results   Component Value Date    AMPHU Negative 07/08/2023    BARBU Negative 07/08/2023    BENZU Negative 07/08/2023    CANNAU Positive (A) 07/08/2023    METHU Negative 07/08/2023    COCAU Negative 07/08/2023    OPIAU Negative 07/08/2023    FENTU Negative 08/22/2020    OXYCOU Negative 07/08/2023    BUPRENORPHIN Negative 07/08/2023        Last Drug Screen Date: 07/11/2023  Opiate Confirmation Test    6-Monoacetylmorphine   Date Value Ref Range Status   07/08/2023 <5 <5 ng/mL Final     Morphine    Date Value Ref Range Status   07/08/2023 <25 <25 ng/mL Final     Codeine   Date Value Ref Range Status   07/08/2023 <25 <25 ng/mL Final     Hydrocodone    Date Value Ref Range Status   07/08/2023 <25 <25 ng/mL Final     Hydromorphone   Date Value Ref Range Status   07/08/2023 <25 <25 ng/mL Final     Oxycodone    Date Value Ref Range Status   07/08/2023 <25 <25 ng/mL Final     Oxymorphone   Date Value Ref Range Status   07/08/2023 <25 <25 ng/mL Final     Buprenorphine    Date Value Ref Range Status   07/08/2023 <5 <5 ng/mL Final     Norbuprenorphine   Date Value Ref Range Status   07/08/2023 <5 <5 ng/mL Final     Fentanyl    Date Value Ref Range Status   07/08/2023 <0.5 <0.5 ng/mL Final     Norfentanyl   Date Value Ref Range Status   07/08/2023 <1.0 <1.0 ng/mL Final

## 2024-03-02 NOTE — Unmapped (Signed)
 CHRONIC PAIN FOLLOW UP VISIT  Bethany Angle, MD      Date: March 02, 2024  Patient Name: Bethany Meyer  MRN: [id number]  PCP: Bethany Meyer    Assessment/Plan:   Bethany Meyer is a 68 y.o. female with a PMH of chronic pain secondary to RA and idiopathic neuropathy, who presented for evaluation and recommendations for chronic pain management.  - Patient's pain is not controlled.  - Benefits and risks of ongoing treatment reviewed.   - Benefits>risks  Assessment & Plan  Chronic pain syndrome with low back and bilateral lower extremity pain  Chronic pain persists despite increased tramadol  and regular gabapentin . Pain patch considered for continuous relief.  - Prescribe Butrans  5mcg/hr patch for long-acting pain relief  - Continue tramadol  50 mg every six hours as needed.  - Continue gabapentin .    Rheumatoid arthritis with chronic bilateral hand and lower extremity pain and swelling  Rheumatoid arthritis poorly controlled due to insurance issues affecting access to meds  - hasn't been able to start injections due to PA issues. She will follow up with Rheum to discuss next steps.    Right shoulder dislocation, status post reduction  Limited range of motion persists post-reduction. Orthopedic follow-up planned.  - Schedule follow-up appointment with orthopedics for right shoulder evaluation.  - Consider physical therapy based on orthopedic evaluation.    Cyclic vomiting syndrome and chronic abdominal pain possibly related to cannabis  - seems mildly improved after recent admission for pyelonephritis. Continue avoiding cannabis. Has been abstinent about 6 weeks now.  - discontinue haldol          - Follow-up in 6 weeks, or sooner should s/s worsen or fail to improve.  Every 12 weeks, in-person follow-up is required per pain contract to maintain active prescriptions.     I personally spent 30 minutes face-to-face and non-face-to-face in the care of this patient, which includes all pre, intra, and post visit time on the date of service.  All documented time was specific to the E/M visit and does not include any procedures that may have been performed.    Subjective:     History of Present Illness  Bethany Meyer is a 68 year old female with rheumatoid arthritis who presents with ongoing pain management issues and recent hospitalization for a kidney infection.    She was hospitalized for a kidney infection, experiencing severe dehydration and vomiting. Her stomach pain has improved since discharge, and she is cautious with her diet, consuming mostly liquids.    During the hospitalization, she dislocated her right shoulder, requiring surgery. She continues to have pain and limited mobility in the shoulder, with difficulty moving it upwards or backwards. She is scheduled for an orthopedic follow-up.    She has not received methotrexate  injections for two to three weeks due to insurance issues, leading to sharp pain and numbness in her left hand and feet. Her rheumatoid arthritis symptoms are affecting her daily activities.    She has difficulty sleeping due to pain, particularly in her right shoulder and back. She takes tramadol  every six to eight hours with limited relief and gabapentin  regularly. Previous trials of Haldol  and Seroquel  for sleep were ineffective.    Financial constraints limit her ability to purchase nutritional supplements like Ensure, which she finds beneficial. She uses Mylanta for acid reflux, which provides some relief.  ROS:    *The review of systems is negative except what was stated elsewhere in the chart.     Objective:     Vitals:    03/02/24 1101   BP: 115/65   BP Site: L Arm   BP Position: Sitting   BP Cuff Size: Medium   Pulse: 70   Resp: 18   Temp: 36.4 ??C (97.6 ??F)   TempSrc: Temporal   SpO2: 98%   Weight: 55.7 kg (122 lb 12.8 oz)   Height: 167.6 cm (5' 5.98)        Physical Exam  MEASUREMENTS: Weight- 122.             Records Review: Results        [place]  Controlled Substance Reporting System was reviewed for this patient today.   02/24/2024 11/06/2023  1 Tramadol  Hcl 50 Mg Tablet 90.00 30 Br Eri [id number] Nor (1409) 0/2 30.00 MME Medicare [place]   01/27/2024 09/10/2023  1 Gabapentin  300 Mg Capsule 270.00 30 Br Eri [id number] Nor (1409) 3/2  Medicare [place]   01/22/2024 12/23/2023  1 Tramadol  Hcl 50 Mg Tablet 90.00 30 [place] [id number] Nor (1409) 1/2 30.00 MME Medicare [place]   12/28/2023 09/10/2023  1 Gabapentin  300 Mg Capsule 270.00 30 Br Eri [id number] Nor (1409) 2/2  Medicare [place]   12/23/2023 12/23/2023  1 Tramadol  Hcl 50 Mg Tablet 90.00 30 [place] [id number] Nor (1409) 0/2 30.00 MME Private Pay [place]   12/23/2023 12/23/2023  1 Buprenorphine  5 Mcg/hr Patch 4.00 28 [place] [id number] Nor (1409) 0/5 0.12 mg Medicare 

## 2024-03-04 NOTE — Unmapped (Signed)
 [facility]    Patient Onboarding/Medication Counseling    Switching from Actemra  to [name]     Bethany Meyer is a 68 y.o. female with rheumatoid arthritis who I am counseling today on initiation of therapy.  I am speaking to the patient.    Was a Nurse, learning disability used for this call? No    Verified patient's date of birth / HIPAA.    Specialty medication(s) to be sent: Inflammatory Disorders: [name]       Non-specialty medications/supplies to be sent: none      Medications not needed at this time: n/a         [name]  (tocilizumab -aazg)   The patient declined counseling on medication administration, missed dose instructions, goals of therapy, side effects and monitoring parameters, warnings and precautions, drug/food interactions, and storage, handling precautions, and disposal because they were counseled in clinic. The information in the declined sections below are for informational purposes only and was not discussed with patient.     Medication & Administration     Dosage: Rheumatoid arthritis (100kg or more): Inject 162mg  under the skin one time weekly Patient on once a week dosing at 55.7kg per Kerney Finner, CPP    Lab tests required prior to treatment initiation:   Tuberculosis: Tuberculosis screening resulted in a non-reactive Quantiferon TB Gold assay. 11/23/2021  Absolute neutrophil count: ANC greater than 2,000/mm3. 02/25/2024  Platelet count: Platelets are greater than 100,000/mm3. 02/25/2024  Liver function tests: ALT and AST are less than 1.5 x ULN. 02/25/2024      Administration:     Artist all supplies needed for injection on a clean, flat working surface: medication syringe removed from packaging, alcohol swab, sharps container, etc.  Look at the medication label - look for correct medication, correct dose, and check the expiration date  Look at the medication - the liquid in the syringe should appear clear and colorless to pale yellow  Lay the syringe on a flat surface and allow it to warm up to room temperature for at least 30 minutes  Select injection site - you can use the front of your thigh or your belly (but not the area 2 inches around your belly button); if someone else is giving you the injection you can also use your upper arm in the skin covering your triceps muscle  Prepare injection site - wash your hands and clean the skin at the injection site with an alcohol swab and let it air dry, do not touch the injection site again before the injection  Pull off the needle safety cap, do not remove until immediately prior to injection  Pinch the skin - with your hand not holding the syringe pinch up a fold of skin at the injection site using your forefinger and thumb  Insert the needle into the fold of skin at about a 45 degree angle - it's best to use a quick dart-like motion - with the syringe in position, release the pinch of skin and allow the skin to relax  Push the plunger down slowly as far as it will go until the syringe is empty, if the plunger is not fully depressed the needle shield will not extend to cover the needle when it is removed  Check that the syringe is empty and keep pressing down on the plunger while you pull the needle out at the same angle as inserted; after the needle is removed completely from the skin, release the plunger allowing the needle  shield to activate and cover the used needle  Dispose of the used syringe immediately in your sharps disposal container  If you see any blood at the injection site, press a cotton ball or gauze on the site and maintain pressure until the bleeding stops, do not rub the injection site    Prefilled auto-injector pen:     Gather all supplies needed for injection on a clean, flat working surface: medication pen removed from packaging, alcohol swab, sharps container, etc.  Look at the medication label - look for correct medication, correct dose, and check the expiration date  Remove the sealed tray from the carton.  Let the sealed tray sit on a clean surface for 45 minutes before use to allow the medicine in the autoinjector to reach room temperature  Peel the seal off the tray and turn tray upside down to remove single-dose autoinjector  Look at the medication - the liquid visible in the window on the side of the pen device should appear clear and colorless to pale yellow and should not contain any flakes or particles.  Check the autoinjector to make sure it is not cracked or damaged.  Air bubbles in the medicine are normal.   Select injection site - you can use the front of your thigh or your belly (but not the area 2 inches around your belly button); if someone else is giving you the injection you can also use your upper arm in the skin covering your triceps muscle  Prepare injection site - wash your hands and clean the skin at the injection site with an alcohol swab and let it air dry, do not touch the injection site again before the injection  Hold the auto-injector with the clear cap on top and pull off the clear safety cap, do not remove until immediately prior to injection.  Use the autoinjector within 3 minutes after removing cap to avoid contamination and do not touch the needle shield (orange)  Do NOT Pinch your skin. Put the orange needle shield against your skin at the injection site at a 90 degree angle, hold the pen such that you can see the clear medication window  In a single motion, push and hold the autoinjector firmly against your skin until you hear a first click. This means the injection has started.   The orange plunger rod will move through the window during the injection.   Hold the autoinjector in place until you hear a second click. This may take up to 10 seconds.   There will be a second click sound when the injection is complete, verify the window is solid purple before pulling the pen away from your skin.  Wait and slowly count to 5 after you hear the second click. Continue to hold the autoinjector in place to make sure you inject a full dose. Do not lift the autoinjector until you are sure 5 seconds has passed, and the injection is complete.   Dispose of the used auto-injector pen immediately in your sharps disposal container the needle will be covered automatically  If you see any blood at the injection site, press a cotton ball or gauze on the site and maintain pressure until the bleeding stops, do not rub the injection site  [name]        Adherence/Missed dose instructions:  If your injection is given more than 2 days after your scheduled injection date - consult your pharmacist for additional instructions on how to adjust your dosing schedule.  Goals of Therapy     Giant cell arteritis  Reduction or normalization of ESR and CRP  Prevention of ischemic manifestations  Preservation of vision  Maintenance of function  Maintenance of effective psychosocial functioning    Polyarticular juvenile idiopathic arthritis  Assuring normal growth and development  Avoidance of long-term systemic glucocorticoid use  Protection of remaining articular structures  Maintenance of function  Maintenance of effective psychosocial functioning    Systemic juvenile idiopathic arthritis  Assuring normal growth and development  Avoidance of long-term systemic glucocorticoid use  Protection of remaining articular structures  Maintenance of function  Maintenance of effective psychosocial functioning    Rheumatoid arthritis  Achieve low disease activity or remission  Slow disease progression  Protection of remaining articular structures  Maintenance of function  Maintenance of effective psychosocial functioning      Side Effects & Monitoring Parameters     Injection site reaction (redness, irritation, inflammation localized to the site of administration)  Signs of a common cold - minor sore throat, runny or stuffy nose, etc.  Headache    The following side effects should be reported to the provider:  Signs of a hypersensitivity reaction - rash; hives; itching; red, swollen, blistered, or peeling skin; wheezing; tightness in the chest or throat; difficulty breathing, swallowing, or talking; swelling of the mouth, face, lips, tongue, or throat; etc.  Reduced immune function - report signs of infection such as fever; chills; body aches; very bad sore throat; ear or sinus pain; cough; more sputum or change in color of sputum; pain with passing urine; wound that will not heal, etc.  Also at a slightly higher risk of some malignancies (mainly skin and blood cancers) due to this reduced immune function.  In the case of signs of infection - the patient should hold the next dose of medication and call your primary care provider to ensure adequate medical care.  Treatment may be resumed when infection is treated and patient is asymptomatic.  Signs of unexplained bruising or bleeding - throwing up blood or emesis that looks like coffee grounds; black, tarry, or bloody stool; etc.  Changes in skin - a new growth or lump that forms; changes in shape, size, or color of a previous mole or marking  Shortness of breath  Chest pain/pressure      Warnings, Precautions, & Contraindications     Have your bloodwork checked as you have been told by your prescriber (ANC, platelets, lipids, etc.)  Patients with concomitant IBD or history of diverticulitis are at an increased risk for GI perforations.  Birth control pills and other hormone-based birth control may not work as well to prevent pregnancy  Talk with your doctor if you are pregnant, planning to become pregnant, or breastfeeding  Discuss the possible need for holding your dose(s) of medication when a planned procedure is scheduled with the prescriber as it may delay healing/recovery timeline       Drug/Food Interactions     Medication list reviewed in Epic. The patient was instructed to inform the care team before taking any new medications or supplements. No drug interactions identified.   Talk with you prescriber or pharmacist before receiving any live vaccinations while taking this medication and after you stop taking it    Storage, Handling Precautions, & Disposal     Store at 2??C to 8??C (36??F to 46??F). Do not freeze.   Protect from light (store in the original package until time of use).   Prefilled syringes  and autoinjectors must always be kept dry; may store up to 2 weeks at <=25??C (<=77??F) ([name] ) once removed from the refrigerator.  Do not shake  Dispose of used syringes/pens in a sharps disposal container        Current Medications (including OTC/herbals), Comorbidities and Allergies     Current Medications[1]    Allergies[2]    Problem List[3]    Medication list has been reviewed and updated in Epic: Yes    Allergies have been reviewed and updated in Epic: Yes    Appropriateness of Therapy     Acute infections noted within Epic:  No active infections  Patient reported infection: None    Is the medication and dose appropriate considering the patient???s diagnosis, treatment, and disease journey, comorbidities, medical history, current medications, allergies, therapeutic goals, self-administration ability, and access barriers? Yes    Prescription has been clinically reviewed: Yes      Baseline Quality of Life Assessment      How many days over the past month did your condition  keep you from your normal activities? For example, brushing your teeth or getting up in the morning. Patient declined to answer    Financial Information     Medication Assistance provided: Prior Authorization    Anticipated copay of $0 / 28DS reviewed with patient. Verified delivery address.    Delivery Information     Scheduled delivery date: 03/10/2024    Expected start date: 03/10/2024      Medication will be delivered via UPS to the prescription address in [facility].  This shipment will not require a signature.      Explained the services we provide at [facility] and that each month we would call to set up refills.  Stressed importance of returning phone calls so that we could ensure they receive their medications in time each month.  Informed patient that we should be setting up refills 7-10 days prior to when they will run out of medication.  A pharmacist will reach out to perform a clinical assessment periodically.  Informed patient that a welcome packet, containing information about our pharmacy and other support services, a Notice of Privacy Practices, and a drug information handout will be sent.      The patient or caregiver noted above participated in the development of this care plan and knows that they can request review of or adjustments to the care plan at any time.      Patient or caregiver verbalized understanding of the above information as well as how to contact the pharmacy at [phone] option 4 with any questions/concerns.  The pharmacy is open Monday through Friday 8:30am-4:30pm.  A pharmacist is available 24/7 via pager to answer any clinical questions they may have.    Patient Specific Needs     Does the patient have any physical, cognitive, or cultural barriers? No    Does the patient have adequate living arrangements? (i.e. the ability to store and take their medication appropriately) Yes    Did you identify any home environmental safety or security hazards? No    Patient prefers to have medications discussed with  Patient     Is the patient or caregiver able to read and understand education materials at a high school level or above? Yes    Patient's primary language is  English     Is the patient high risk? No    Does the patient have an additional or emergency contact listed  in their chart? Yes    SOCIAL DETERMINANTS OF HEALTH     At the [facility], we have learned that life circumstances - like trouble affording food, housing, utilities, or transportation can affect the health of many of our patients.   That is why we wanted to ask: are you currently experiencing any life circumstances that are negatively impacting your health and/or quality of life? No    Social Drivers of Health     Food Insecurity: No Food Insecurity (02/16/2024)    Received from [facility]    Hunger Vital Sign     Within the past 12 months, you worried that your food would run out before you got the money to buy more.: Never true     Within the past 12 months, the food you bought just didn't last and you didn't have money to get more.: Never true   Tobacco Use: High Risk (03/02/2024)    Patient History     Smoking Tobacco Use: Every Day     Smokeless Tobacco Use: Never     Passive Exposure: Not on file   Transportation Needs: Unmet Transportation Needs (02/16/2024)    Received from [facility] - Transportation     In the past 12 months, has lack of transportation kept you from medical appointments or from getting medications?: Yes     In the past 12 months, has lack of transportation kept you from meetings, work, or from getting things needed for daily living?: Yes   Alcohol Use: Not At Risk (10/10/2023)    Alcohol Use     How often do you have a drink containing alcohol?: Never     How many drinks containing alcohol do you have on a typical day when you are drinking?: 1 - 2     How often do you have 5 or more drinks on one occasion?: Never   Housing: Low Risk  (10/10/2023)    Housing     Within the past 12 months, have you ever stayed: outside, in a car, in a tent, in an overnight shelter, or temporarily in someone else's home (i.e. couch-surfing)?: No     Are you worried about losing your housing?: No   Physical Activity: Unknown (02/02/2019)    Exercise Vital Sign     Days of Exercise per Week: Patient declined     Minutes of Exercise per Session: Patient declined   Utilities: At Risk (02/16/2024)    Received from [facility] Utilities     In the past 12 months has the electric, gas, oil, or water  company threatened to shut off services in your home?: Yes Stress: Unknown (02/02/2019)    [employer] of Occupational Health - Occupational Stress Questionnaire     Feeling of Stress : Patient declined   Interpersonal Safety: Not At Risk (02/25/2024)    Interpersonal Safety     Unsafe Where You Currently Live: No     Physically Hurt by Anyone: No     Abused by Anyone: No   Substance Use: Low Risk  (10/10/2023)    Substance Use     In the past year, how often have you used prescription drugs for non-medical reasons?: Never     In the past year, how often have you used illegal drugs?: Never     In the past year, have you used any substance for non-medical reasons?: Yes   Intimate Partner Violence: Not  At Risk (02/25/2024)    Humiliation, Afraid, Rape, and Kick questionnaire     Fear of Current or Ex-Partner: No     Emotionally Abused: No     Physically Abused: No     Sexually Abused: No   Social Connections: Moderately Isolated (02/16/2024)    Received from [facility]    Social Connection and Isolation Panel     In a typical week, how many times do you talk on the phone with family, friends, or neighbors?: More than three times a week     How often do you get together with friends or relatives?: More than three times a week     How often do you attend church or religious services?: More than 4 times per year     Do you belong to any clubs or organizations such as church groups, unions, fraternal or athletic groups, or school groups?: No     How often do you attend meetings of the clubs or organizations you belong to?: Never     Are you married, widowed, divorced, separated, never married, or living with a partner?: Never married   Physicist, medical Strain: High Risk (04/29/2023)    Overall Financial Resource Strain (CARDIA)     Difficulty of Paying Living Expenses: Hard   Health Literacy: Not on file (08/24/2020)   Internet Connectivity: Not on file       Would you be willing to receive help with any of the needs that you have identified today? No       Velma Ober, PharmD  [facility] Specialty Pharmacist       [1]   Current Outpatient Medications   Medication Sig Dispense Refill    acetaminophen  (TYLENOL ) 325 MG tablet Take 1 tablet (325 mg total) by mouth two (2) times a day as needed for pain.      alendronate  (FOSAMAX ) 70 MG tablet Take 1 tablet (70 mg total) by mouth every seven (7) days. 12 tablet 3    arm brace Misc Boxer splint, metacarpal wrist brace 1 each 0    aspirin  81 MG chewable tablet Chew 1 tablet (81 mg total) before bedtime. 36 tablet 3    buprenorphine  (BUTRANS ) 5 mcg/hour PTWK transdermal patch Place 1 patch on the skin every seven (7) days. 4 patch 5    buPROPion  (WELLBUTRIN  XL) 150 MG 24 hr tablet Take 1 tablet (150 mg total) by mouth every morning. 90 tablet 0    dicyclomine  (BENTYL ) 20 mg tablet Take 1 tablet (20 mg total) by mouth four times a day as needed (stomach cramps). 360 tablet 3    empty container Misc Use as directed 1 each 2    folic acid  (FOLVITE ) 1 MG tablet Take 1 tablet (1,000 mcg total) by mouth daily. 90 tablet 3    gabapentin  (NEURONTIN ) 300 MG capsule 4 CAPSULES THREE TIMES DAILY. INSURANCE WILL ONLY COVER 9 CAPSULES PER DAY 360 capsule 2    hydrOXYzine  (ATARAX ) 10 MG tablet Take 1 tablet (10 mg total) by mouth every six (6) hours as needed for itching. 270 tablet 1    leg brace (KNEE SUPPORT BRACE) Misc 1 each by Miscellaneous route daily as needed. 1 each 0    naloxone  (NARCAN ) 4 mg nasal spray One spray in either nostril once for known/suspected opioid overdose. May repeat every 2-3 minutes in alternating nostril til EMS arrives 2 each 0    needle, disp, 25 gauge 25 gauge  x 5/8 Ndle For methotrexate  injections 100 each 0    omeprazole  (PRILOSEC) 20 MG capsule Take 1 capsule (20 mg total) by mouth daily. 100 capsule 3    ondansetron  (ZOFRAN ) 4 MG tablet Take 1 tablet (4 mg total) by mouth every six (6) hours as needed for nausea.      polyethylene glycol (GLYCOLAX ) 17 gram/dose powder Take 17 g by mouth daily. 1700 g 2    SYMBICORT  80-4.5 mcg/actuation inhaler INHALE 2 PUFFS IN THE MORNING AND IN THE EVENING      tocilizumab -aazg ([name]  AUTOINJECTOR) 162 mg/0.9 mL PnIj Inject the contents of 1 pen (162 mg) under the skin once a week. 10.8 mL 3    [START ON 03/25/2024] traMADol  (ULTRAM ) 50 mg tablet Take 1 tablet (50 mg total) by mouth every six (6) hours as needed for pain. 120 tablet 0    [START ON 04/23/2024] traMADol  (ULTRAM ) 50 mg tablet Take 1 tablet (50 mg total) by mouth every six (6) hours as needed for pain. 120 tablet 0    umeclidinium (INCRUSE ELLIPTA ) 62.5 mcg/actuation inhaler Inhale 1 puff daily. 30 each 2    VITAMIN B-1 100 mg tablet Take 1 tablet (100 mg total) by mouth daily. 100 tablet 3     No current facility-administered medications for this visit.   [2] No Known Allergies  [3]   Patient Active Problem List  Diagnosis    Tobacco use disorder    GERD (gastroesophageal reflux disease)    Gastritis    Functional Abdominal pain    Anxiety    Depression    Arthritis    Atrial flutter    (CMS-HCC)    Immunosuppressed status (HHS-HCC)    Rheumatoid arthritis involving both feet    (CMS-HCC)    Stage 1 mild COPD by GOLD classification    (CMS-HCC)    Piriformis syndrome of both sides    Spinal stenosis of lumbar region with neurogenic claudication    Spondylolisthesis of lumbar region    Duodenal ulcer disease    Paraesophageal hernia    Osteomyelitis (CMS-HCC)    Slow transit constipation    Combined forms of age-related cataract of both eyes    Enrolled in chronic care management    Other chronic pain

## 2024-03-06 DIAGNOSIS — M069 Rheumatoid arthritis, unspecified: Principal | ICD-10-CM

## 2024-03-06 MED ORDER — PREDNISONE 5 MG TABLET
ORAL_TABLET | Freq: Every day | ORAL | 1 refills | 30.00000 days
Start: 2024-03-06 — End: ?

## 2024-03-09 DIAGNOSIS — G8929 Other chronic pain: Principal | ICD-10-CM

## 2024-03-09 DIAGNOSIS — M1990 Unspecified osteoarthritis, unspecified site: Principal | ICD-10-CM

## 2024-03-09 NOTE — Unmapped (Signed)
 [facility] Internal Medicine   CHRONIC CARE MANAGEMENT OUTREACH ENCOUNTER           Date of Service:  03/09/2024      Service:  Care Coordination - phone  Is there someone else in the room? No.   Are you located in [place]? Yes  MyChart use by patient is active: no    Post-outreach Action Items:  Provider: N/A.  CM: follow up to see if pt has an appt with [facility] and with westernization program .  Patient: N/A.        Chronic Care Management (CCM) Outreach  Purpose of outreach: Arthritis and Chronic Pain   Care Manager (CM) completed the following:  Reviewed chart  Chronic pain appt - 03/02/24  Chronic pain syndrome with low back and bilateral lower extremity pain  Chronic pain persists despite increased tramadol  and regular gabapentin . Pain patch considered for continuous relief.  - Prescribe Butrans  5mcg/hr patch for long-acting pain relief  - Continue tramadol  50 mg every six hours as needed.  - Continue gabapentin .     Rheumatoid arthritis with chronic bilateral hand and lower extremity pain and swelling  Rheumatoid arthritis poorly controlled due to insurance issues affecting access to meds  - hasn't been able to start injections due to PA issues. She will follow up with Rheum to discuss next steps.  Right shoulder dislocation, status post reduction  Limited range of motion persists post-reduction. Orthopedic follow-up planned.  - Schedule follow-up appointment with orthopedics for right shoulder evaluation.  - Consider physical therapy based on orthopedic evaluation.  10/8- Rheumatology appt   Rheumatoid arthritis with active disease  Not controlled. [name]  approved to by insurance. Discussed [name]  side effects, including rare bowel perforation risk.  - Start [name]  weekly injections. Provided with pharmacy's number to schedule.   - Update monitoring labs today; CBCw/diff, Cr, AST, ALT  - Administer Depo 40mg  IM for current symptoms.  - Hold off on methotrexate  due to recent nausea  - Encourage smoking cessation.    Osteoporosis  Osteoporosis previously treated with Fosamax  but didn't take consistently. Not currently on treatment.  - Order bone density scan.  - Consider reclast infusion if indicated.  Hospitalization- [facility]  9/28-9/29/25  * Acute pyelonephritis  Rocephin , pain control  Follow cultures    AKI (acute kidney injury)  Likely ATN secondary to dehydration from vomiting  IV fluid  Monitor renal function and avoid nephrotoxin   Identified Resources:   Sur[facility]or, [facility] - [facility]   [phone]  [facility] - Ortho Follow up  [employer] [phone]  Westernization Assistance Program with [employer]           Updates  New concerns or symptoms: Yes: arm pain  voices unable to make appt for follow up for X-rays at  [facility] clinic per dc summary .she is unable to move her right arm up over her head.   Updates on ongoing concerns/symptoms:  Pt has not been able to pick up her Butrans  medication due to cost, she will pick it up when she has the money.  Pt voices she tried to get a follow up appt with [facility] and was told they did not see an referral for her. CM called [facility] to check option to make an appt for pt per dc summary and was advised they were not able to reach pt on the phone but have her an appt for Monday 10/27 at 945  am for X-rays . Pt was updated and is agreeable to this appt.   Pt has mailed in her application for the westernization program last Friday. This is program that she voices she tried before but stopped when she says they told her the need to sign over her land. CM called this program about this history and was advised they would not ask pt for any cash or land. CM plans to assist pt with the steps to the program as she needs a new heating/cooling symptom for her home. Plan to call next week with pt to assess if they have tried to follow up with pt. Pt is agreeable.  Pt is unable to afford her meds yet with her monthly income is over the income limit for medicaid to help with meds. Pt may qualify for MQB medicaid however, will work toward this application. Pt is not getting any financial assistance from her medicare advantage plan and was agreeable to an appt with her local [facility] volunteer to check on benefits on other plans for the new year.   Phone call to [facility] and spoke with front desk at [facility], she will have their volunteer call pt tomorrow am to schedule an appt, it will likely be in November . Pt and CM agreeable.     New barriers: Yes: unable to afford all her pain meds .  SDOH Screenings: Fill out any related SDOH Modules     Social Drivers of Health     Food Insecurity: No Food Insecurity (02/16/2024)    Received from [facility]    Hunger Vital Sign     Within the past 12 months, you worried that your food would run out before you got the money to buy more.: Never true     Within the past 12 months, the food you bought just didn't last and you didn't have money to get more.: Never true   Tobacco Use: High Risk (03/02/2024)    Patient History     Smoking Tobacco Use: Every Day     Smokeless Tobacco Use: Never     Passive Exposure: Not on file   Transportation Needs: Unmet Transportation Needs (02/16/2024)    Received from [facility] - Transportation     In the past 12 months, has lack of transportation kept you from medical appointments or from getting medications?: Yes     In the past 12 months, has lack of transportation kept you from meetings, work, or from getting things needed for daily living?: Yes   Alcohol Use: Not At Risk (10/10/2023)    Alcohol Use     How often do you have a drink containing alcohol?: Never     How many drinks containing alcohol do you have on a typical day when you are drinking?: 1 - 2     How often do you have 5 or more drinks on one occasion?: Never   Housing: Low Risk  (10/10/2023)    Housing     Within the past 12 months, have you ever stayed: outside, in a car, in a tent, in an overnight shelter, or temporarily in someone else's home (i.e. couch-surfing)?: No     Are you worried about losing your housing?: No   Physical Activity: Unknown (02/02/2019)    Exercise Vital Sign     Days of Exercise per Week: Patient declined     Minutes of Exercise per Session: Patient declined   Utilities: At  Risk (02/16/2024)    Received from [facility] Utilities     In the past 12 months has the electric, gas, oil, or water  company threatened to shut off services in your home?: Yes   Stress: Unknown (02/02/2019)    [employer] of Occupational Health - Occupational Stress Questionnaire     Feeling of Stress : Patient declined   Interpersonal Safety: Not At Risk (02/25/2024)    Interpersonal Safety     Unsafe Where You Currently Live: No     Physically Hurt by Anyone: No     Abused by Anyone: No   Substance Use: Low Risk  (10/10/2023)    Substance Use     In the past year, how often have you used prescription drugs for non-medical reasons?: Never     In the past year, how often have you used illegal drugs?: Never     In the past year, have you used any substance for non-medical reasons?: Yes   Intimate Partner Violence: Not At Risk (02/25/2024)    Humiliation, Afraid, Rape, and Kick questionnaire     Fear of Current or Ex-Partner: No     Emotionally Abused: No     Physically Abused: No     Sexually Abused: No   Social Connections: Moderately Isolated (02/16/2024)    Received from [facility]    Social Connection and Isolation Panel     In a typical week, how many times do you talk on the phone with family, friends, or neighbors?: More than three times a week     How often do you get together with friends or relatives?: More than three times a week     How often do you attend church or religious services?: More than 4 times per year     Do you belong to any clubs or organizations such as church groups, unions, fraternal or athletic groups, or school groups?: No     How often do you attend meetings of the clubs or organizations you belong to?: Never     Are you married, widowed, divorced, separated, never married, or living with a partner?: Never married   Physicist, medical Strain: High Risk (04/29/2023)    Overall Financial Resource Strain (CARDIA)     Difficulty of Paying Living Expenses: Hard   Health Literacy: Not on file (08/24/2020)   Internet Connectivity: Not on file     I provided an intervention for the [employer] SDOH domain. The intervention was Provided [employer]   Recent hospitalization / ED / Urgent Care or providers seen outside the [facility] system: Yes: [facility] 9/28-9/30 for kidney infection .  Medication Assessment: Medication assessment: not taking Betrans  as prescribed  Refills needed: No/ none.  Medication response/ notes: N/A.  Health Maintenance:   Health Maintenance Due   Topic Date Due    Medicare Annual Wellness Visit (AWV)  Never done    Zoster Vaccines (1 of 2) Never done    Lung Cancer Screening Shared Decision Making  Never done    DTaP/Tdap/Td Vaccines (6 - Td or Tdap) 12/31/2020    COPD Spirometry  02/25/2022    Colon Cancer Screening  03/06/2023    COVID-19 Vaccine (8 - Moderna risk 2024-25 season) 01/19/2024    Mammogram  03/11/2024       CCM Care Plan Goal Review:     Wrap-up  Reviewed upcoming clinic appointment(s): Yes.  Plans to keep appointment(s): Yes.  Transportation assistance  needed: No.  Patient was encouraged to reach out to their provider with any questions or concerns    Future Appointments   Date Time Provider Department Center   04/13/2024  8:30 AM Vien, Thersia Kurk, MD [facility]   06/30/2024 10:30 AM Serena Ronnald Norris, FNP [facility]   10/27/2024 10:40 AM Stacia Ozell Spaniel , MD [facility]       A copy of this Patient Outreach/CCM Encounter was sent to patient's Primary Care Provider     CCM Documentation  Time spent in direct care with patient and/or health care proxy via non-in-person encounter(s): 30  Time spent in indirect patient care and coordination: 15

## 2024-03-19 MED ORDER — TRAMADOL 50 MG TABLET
ORAL_TABLET | Freq: Four times a day (QID) | ORAL | 0 refills | 30.00000 days | Status: CP | PRN
Start: 2024-03-19 — End: 2024-04-18

## 2024-03-19 NOTE — Telephone Encounter (Signed)
 Pt left message on triage nurse line regarding referral and lab orders. Called pt and left message to return call [facility] Internal Medicine regarding referral and lab questions.  Spoke to pt. She reports she was referred to have MRI after last admission to [facility]. Pt states when she went to get MRI and lab tests that were ordered, I did not have my ID so they would not do tests.  Pt is requesting PCP provider order tests and MRI. She is also requesting more pain medications cause I had to use more for my right wrist and left shoulder. Advised pt she would need to be seen by provider and message would be sent to Dr Viona regarding pain medication request. Appointment scheduled with PCP on 03/23/24. Pt reminded her next prescription refill for tramadol  is available for 03/25/24 and appointment with Dr Viona on 04/13/24.

## 2024-03-19 NOTE — Telephone Encounter (Signed)
 Called pt and informed her Dr Viona has sent in early refill of her pain medication. Pt was thankful.

## 2024-03-24 DIAGNOSIS — G4700 Insomnia, unspecified: Principal | ICD-10-CM

## 2024-03-24 MED ORDER — QUETIAPINE 25 MG TABLET
ORAL_TABLET | 1 refills | 0.00000 days
Start: 2024-03-24 — End: ?

## 2024-03-25 MED ORDER — TRAMADOL 50 MG TABLET
ORAL_TABLET | Freq: Four times a day (QID) | ORAL | 0 refills | 30.00000 days | Status: CP | PRN
Start: 2024-03-25 — End: 2024-04-24

## 2024-03-25 MED ORDER — QUETIAPINE 25 MG TABLET
ORAL_TABLET | 1 refills | 0.00000 days
Start: 2024-03-25 — End: ?

## 2024-03-26 ENCOUNTER — Other Ambulatory Visit: Payer: Self-pay

## 2024-03-26 ENCOUNTER — Emergency Department

## 2024-03-26 ENCOUNTER — Emergency Department
Admission: EM | Admit: 2024-03-26 | Discharge: 2024-03-26 | Disposition: A | Discharge status: Home / Self Care | Attending: Emergency Medicine | Admitting: Emergency Medicine

## 2024-03-26 DIAGNOSIS — S42495A Other nondisplaced fracture of lower end of left humerus, initial encounter for closed fracture: Secondary | ICD-10-CM | POA: Insufficient documentation

## 2024-03-26 DIAGNOSIS — J323 Chronic sphenoidal sinusitis: Secondary | ICD-10-CM | POA: Diagnosis not present

## 2024-03-26 DIAGNOSIS — Y92410 Unspecified street and highway as the place of occurrence of the external cause: Secondary | ICD-10-CM | POA: Insufficient documentation

## 2024-03-26 DIAGNOSIS — I4891 Unspecified atrial fibrillation: Secondary | ICD-10-CM | POA: Diagnosis not present

## 2024-03-26 DIAGNOSIS — S6992XA Unspecified injury of left wrist, hand and finger(s), initial encounter: Secondary | ICD-10-CM | POA: Diagnosis present

## 2024-03-26 DIAGNOSIS — S2242XA Multiple fractures of ribs, left side, initial encounter for closed fracture: Secondary | ICD-10-CM | POA: Insufficient documentation

## 2024-03-26 DIAGNOSIS — M25552 Pain in left hip: Secondary | ICD-10-CM | POA: Diagnosis not present

## 2024-03-26 DIAGNOSIS — M069 Rheumatoid arthritis, unspecified: Secondary | ICD-10-CM | POA: Diagnosis not present

## 2024-03-26 DIAGNOSIS — S0990XA Unspecified injury of head, initial encounter: Secondary | ICD-10-CM | POA: Diagnosis present

## 2024-03-26 DIAGNOSIS — M542 Cervicalgia: Secondary | ICD-10-CM | POA: Insufficient documentation

## 2024-03-26 DIAGNOSIS — M545 Low back pain, unspecified: Secondary | ICD-10-CM | POA: Diagnosis not present

## 2024-03-26 DIAGNOSIS — S52502A Unspecified fracture of the lower end of left radius, initial encounter for closed fracture: Secondary | ICD-10-CM | POA: Insufficient documentation

## 2024-03-26 DIAGNOSIS — K219 Gastro-esophageal reflux disease without esophagitis: Secondary | ICD-10-CM | POA: Insufficient documentation

## 2024-03-26 DIAGNOSIS — J439 Emphysema, unspecified: Secondary | ICD-10-CM | POA: Insufficient documentation

## 2024-03-26 MED ORDER — LIDOCAINE 5 % EX PTCH
1.0000 | MEDICATED_PATCH | CUTANEOUS | Status: DC
  Administered 2024-03-26: 1 via TRANSDERMAL
  Filled 2024-03-26: qty 1

## 2024-03-26 MED ORDER — OXYCODONE HCL 5 MG PO TABS
5.0000 mg | ORAL_TABLET | Freq: Once | ORAL | Status: AC
  Administered 2024-03-26: 5 mg via ORAL
  Filled 2024-03-26: qty 1

## 2024-03-26 MED ORDER — OXYCODONE-ACETAMINOPHEN 5-325 MG PO TABS
1.0000 | ORAL_TABLET | Freq: Once | ORAL | Status: AC
  Administered 2024-03-26: 1 via ORAL
  Filled 2024-03-26: qty 1

## 2024-03-26 MED ORDER — OXYCODONE HCL 5 MG PO TABS: 5.0000 mg | ORAL_TABLET | Freq: Three times a day (TID) | ORAL | 0 refills | Status: DC | PRN
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
## 2024-03-26 NOTE — ED Notes (Signed)
 Pt still in CT at this time. Will mediate when she comes back.
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
## 2024-03-31 ENCOUNTER — Encounter: Admit: 2024-03-31 | Discharge: 2024-03-31 | Payer: Medicare (Managed Care)

## 2024-03-31 DIAGNOSIS — R0989 Other specified symptoms and signs involving the circulatory and respiratory systems: Principal | ICD-10-CM

## 2024-03-31 DIAGNOSIS — Z1231 Encounter for screening mammogram for malignant neoplasm of breast: Principal | ICD-10-CM

## 2024-03-31 DIAGNOSIS — Z0000 Encounter for general adult medical examination without abnormal findings: Principal | ICD-10-CM

## 2024-03-31 DIAGNOSIS — S2249XD Multiple fractures of ribs, unspecified side, subsequent encounter for fracture with routine healing: Principal | ICD-10-CM

## 2024-03-31 DIAGNOSIS — Z87891 Personal history of nicotine dependence: Principal | ICD-10-CM

## 2024-03-31 DIAGNOSIS — Z1211 Encounter for screening for malignant neoplasm of colon: Principal | ICD-10-CM

## 2024-03-31 DIAGNOSIS — G8929 Other chronic pain: Principal | ICD-10-CM

## 2024-03-31 DIAGNOSIS — M5431 Sciatica, right side: Principal | ICD-10-CM

## 2024-03-31 DIAGNOSIS — S62102D Fracture of unspecified carpal bone, left wrist, subsequent encounter for fracture with routine healing: Principal | ICD-10-CM

## 2024-03-31 DIAGNOSIS — I483 Typical atrial flutter: Principal | ICD-10-CM

## 2024-03-31 LAB — COMPREHENSIVE METABOLIC PANEL
ALBUMIN: 3.7 g/dL (ref 3.4–5.0)
ALKALINE PHOSPHATASE: 96 U/L (ref 46–116)
ALT (SGPT): <7 U/L — ABNORMAL LOW (ref 10–49)
ANION GAP: 8 mmol/L (ref 5–14)
AST (SGOT): 17 U/L (ref <=34)
BILIRUBIN TOTAL: 0.3 mg/dL (ref 0.3–1.2)
BLOOD UREA NITROGEN: 26 mg/dL — ABNORMAL HIGH (ref 9–23)
BUN / CREAT RATIO: 20
CALCIUM: 9.5 mg/dL (ref 8.7–10.4)
CHLORIDE: 110 mmol/L — ABNORMAL HIGH (ref 98–107)
CO2: 21.9 mmol/L (ref 20.0–31.0)
CREATININE: 1.3 mg/dL — ABNORMAL HIGH (ref 0.55–1.02)
EGFR CKD-EPI (2021) FEMALE: 45 mL/min/1.73m2 — ABNORMAL LOW (ref >=60)
GLUCOSE RANDOM: 94 mg/dL (ref 70–99)
POTASSIUM: 4.2 mmol/L (ref 3.4–4.8)
PROTEIN TOTAL: 7.2 g/dL (ref 5.7–8.2)
SODIUM: 140 mmol/L (ref 135–145)

## 2024-03-31 LAB — TSH: THYROID STIMULATING HORMONE: 1.255 u[IU]/mL (ref 0.550–4.780)

## 2024-03-31 MED ORDER — QUETIAPINE 25 MG TABLET
ORAL_TABLET | 0 refills | 0.00000 days
Start: 2024-03-31 — End: ?

## 2024-03-31 MED ORDER — GABAPENTIN 300 MG CAPSULE
ORAL_CAPSULE | Freq: Three times a day (TID) | ORAL | 2 refills | 40.00000 days
Start: 2024-03-31 — End: ?
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
## 2024-03-31 NOTE — Patient Instructions (Signed)
 Good to see you  Orthopedic referral for wrist fracture  Stool cards for colon cancer (mail them back once you're done)  Ordered mammogram for breast cancer  Continue to cut back on smoking, you're doing well  CT of chest to evaluate lungs
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
## 2024-04-03 MED ORDER — HYDROXYZINE HCL 10 MG TABLET
ORAL_TABLET | Freq: Four times a day (QID) | ORAL | 1 refills | 0.00000 days | PRN
Start: 2024-04-03 — End: ?

## 2024-04-05 MED FILL — TYENNE AUTOINJECTOR 162 MG/0.9 ML SUBCUTANEOUS PEN INJECTOR: SUBCUTANEOUS | 28 days supply | Qty: 3.6 | Fill #1

## 2024-04-06 MED ORDER — HYDROXYZINE HCL 10 MG TABLET
ORAL_TABLET | Freq: Four times a day (QID) | ORAL | 1 refills | 68.00000 days | Status: CP | PRN
Start: 2024-04-06 — End: 2025-03-10
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
## 2024-04-10 MED ORDER — QUETIAPINE 25 MG TABLET
ORAL_TABLET | 1 refills | 0.00000 days
Start: 2024-04-10 — End: ?

## 2024-04-12 DIAGNOSIS — G8929 Other chronic pain: Principal | ICD-10-CM

## 2024-04-12 DIAGNOSIS — M1990 Unspecified osteoarthritis, unspecified site: Principal | ICD-10-CM
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
## 2024-04-12 NOTE — Progress Notes (Signed)
 Provider Panel Review:  CM met with PCP Laymon Delton on 04/08/2453 and reviewed patent's CCM enrollment goals, progression of care, and Social Determinants of Health (SDOH) barriers.     Next Panel Meeting will be next quarter around 07/09/24          PCP identified patient's CCM care plan diagnosis are currently stable however ongoing. Pt is struggling with navigating healthcare systems to make complete follow up appt such as for PT. CM is also assisting pt with resources to help with housing issues. Pt appears to do better with reminders and check ins. Provider is agreeable

## 2024-04-13 DIAGNOSIS — G8929 Other chronic pain: Principal | ICD-10-CM

## 2024-04-13 DIAGNOSIS — R1115 Cyclical vomiting syndrome unrelated to migraine: Principal | ICD-10-CM

## 2024-04-13 DIAGNOSIS — M25511 Pain in right shoulder: Principal | ICD-10-CM

## 2024-04-13 DIAGNOSIS — M1990 Unspecified osteoarthritis, unspecified site: Principal | ICD-10-CM

## 2024-04-13 MED ORDER — BUPRENORPHINE 10 MCG/HOUR WEEKLY TRANSDERMAL PATCH
MEDICATED_PATCH | TRANSDERMAL | 5 refills | 28.00000 days | Status: CP
Start: 2024-04-13 — End: 2024-09-28

## 2024-04-13 MED ORDER — QUETIAPINE 25 MG TABLET
ORAL_TABLET | ORAL | 1 refills | 0.00000 days | Status: CP
Start: 2024-04-13 — End: ?

## 2024-04-13 NOTE — Progress Notes (Signed)
I was the supervising physician in the delivery of the service. Glen Blatchley A Kenasia Scheller, PA

## 2024-04-13 NOTE — Progress Notes (Signed)
 [facility]     Reason for visit: Pain    Questions / Concerns that need to be addressed:     Screening BP 122/57 P 68    Omron BPs (complete if screening BP has a systolic  > 130 or diastolic > 80)  BP#1    BP#2   BP#3     Average BP   (please note this as a comment in vitals)     PTHomeBP           Dexcom or Libre CGM in use? If so, pull appropriate reporting through portal (Dexcom) or EPIC order [name]).    HCDM reviewed and updated in Epic:    We are working to make sure all of our patients??? wishes are updated in Epic and part of that is documenting a Environmental Health Practitioner for each patient  A Health Care Decision Maker is someone you choose who can make health care decisions for you if you are not able - who would you most want to do this for you????  was updated.    HCDM (patient stated preference): Bethany Meyer, Bethany Meyer - Daughter - [phone]    BPAs completed:      Annual Screenings:   Tobacco and Falls - 65+  __________________________________________________________________________________________    SCREENINGS COMPLETED IN FLOWSHEETS      AUDIT       PHQ2       PHQ9          GAD7       COPD Assessment       Falls Risk
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
## 2024-04-13 NOTE — Progress Notes (Deleted)
 [facility] at [facility]     Reason for visit: Follow up    Questions / Concerns that need to be addressed:     Screening BP- ***    Omron BPs (complete if screening BP has a systolic  > 130 or diastolic > 80)  BP#1 ***   BP#2 ***  BP#3 ***    Average BP ***  (please note this as a comment in vitals)     PTHomeBP     Diabetes:  Regularly checking blood sugars?: {Blank multiple:19197::***,yes,no,n/a}  If yes, when? Complete log for past 7 days  Date Before Breakfast After Breakfast Before Lunch After Lunch Before Dinner After Dinner Before Bed                                                                                                                                     Dexcom or Libre CGM in use? If so, pull appropriate reporting through portal (Dexcom) or EPIC order Bethany Meyer).    HCDM reviewed and updated in Epic:    We are working to make sure all of our patients??? wishes are updated in Epic and part of that is documenting a Environmental Health Practitioner for each patient  A Health Care Decision Maker is someone you choose who can make health care decisions for you if you are not able - who would you most want to do this for you????  {Updated:56707}    HCDM (patient stated preference): Meyer,Bethany - Daughter - [phone]    BPAs completed:  {IMCBPAS revised:106305}    Annual Screenings:   {imcannualscreenings:106677}  __________________________________________________________________________________________    SCREENINGS COMPLETED IN FLOWSHEETS      AUDIT       PHQ2       PHQ9          GAD7       COPD Assessment       Falls Risk

## 2024-04-18 MED ORDER — TRAMADOL 50 MG TABLET
ORAL_TABLET | Freq: Four times a day (QID) | ORAL | 0 refills | 30.00000 days | Status: CP | PRN
Start: 2024-04-18 — End: 2024-05-18

## 2024-04-19 ENCOUNTER — Inpatient Hospital Stay: Admit: 2024-04-19 | Discharge: 2024-04-20 | Payer: Medicare (Managed Care)

## 2024-04-20 NOTE — Progress Notes (Signed)
 [facility] Refill Coordination Note    Specialty Medication(s) to be Shipped:   Inflammatory Disorders: tyenne     Other medication(s) to be shipped: No additional medications requested for fill at this time    Specialty Medications not needed at this time: N/A     Bethany Meyer, DOB: 1955-11-25  Phone: There are no phone numbers on file.      All above HIPAA information was verified with patient.     Was a nurse, learning disability used for this call? No    Completed refill call assessment today to schedule patient's medication shipment from the [facility] Delivery [facility]  [phone]).  All relevant notes have been reviewed.     Specialty medication(s) and dose(s) confirmed: Regimen is correct and unchanged.   Changes to medications: Azaela reports no changes at this time.  Changes to insurance: No  New side effects reported not previously addressed with a pharmacist or physician: None reported  Questions for the pharmacist: No    Confirmed patient received a Conservation Officer, Historic Buildings and a Surveyor, Mining with first shipment. The patient will receive a drug information handout for each medication shipped and additional FDA Medication Guides as required.       DISEASE/MEDICATION-SPECIFIC INFORMATION        For patients on injectable medications: Next injection is scheduled for 12.5.25.    SPECIALTY MEDICATION ADHERENCE     Medication Adherence    Patient reported X missed doses in the last month: 0  Specialty Medication: [name]  AUTOINJECTOR 162 mg/0.9 mL Pnij (tocilizumab -aazg)  Patient is on additional specialty medications: No              Were doses missed due to medication being on hold? No      [name]  AUTOINJECTOR 162 mg/0.9 mL Pnij (tocilizumab -aazg): 2 doses of medicine on hand       REFERRAL TO PHARMACIST     Referral to the pharmacist: Not needed      [facility]     Shipping address confirmed in Epic.     Cost and Payment: Patient has a $0 copay, payment information is not required.    Delivery Scheduled: Yes, Expected medication delivery date: 12.12.25.     Medication will be delivered via UPS to the prescription address in Epic WAM.    Doyal Hurst   [facility]  Specialty Technician

## 2024-04-21 NOTE — Progress Notes (Signed)
 Pt cancelled appt after seeing pain clinic

## 2024-04-22 ENCOUNTER — Other Ambulatory Visit: Payer: Self-pay | Admitting: Orthopedic Surgery

## 2024-04-22 ENCOUNTER — Ambulatory Visit: Admit: 2024-04-22 | Discharge: 2024-04-22 | Payer: Medicare (Managed Care)

## 2024-04-22 ENCOUNTER — Inpatient Hospital Stay: Admit: 2024-04-22 | Discharge: 2024-04-22 | Payer: Medicare (Managed Care)

## 2024-04-22 DIAGNOSIS — M25311 Other instability, right shoulder: Secondary | ICD-10-CM

## 2024-04-22 DIAGNOSIS — S46001A Unspecified injury of muscle(s) and tendon(s) of the rotator cuff of right shoulder, initial encounter: Secondary | ICD-10-CM

## 2024-04-22 DIAGNOSIS — M25511 Pain in right shoulder: Secondary | ICD-10-CM
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
## 2024-04-22 NOTE — Patient Instructions (Addendum)
 Continue wearing Velcro wrist brace at all times except for hygiene (showering, etc.).  In 2 weeks, okay to remove brace 3 times a day for gentle wrist range of motion.  Limit weightbearing to less than 5 pounds.  I have given you a referral to physical therapy.  Please call a clinic close to your home to schedule this.         Thank you for choosing [facility]!  We appreciate the opportunity to participate in your care.      If questions or concerns arise after your visit, you may contact us  by [facility] or by calling [phone].  ?  MyChart messages: These messages can be sent to your provider and will be checked by their clinical support staff.? The messages are checked throughout the day during normal business hours from 8:30 am-4:00 pm Monday-Friday, however responses may take up to 48 hours.? Please use this method of communication for non-urgent and non-emergent concerns, questions, refill requests or inquiries only.? ?Our team will help respond to all of your questions.? Please note that you may be asked to see a provider by either a telehealth or in person visit if it is deemed your questions are best handled in the clinic setting in person.??    If you have an issue that requires emergent attention that cannot wait, consider coming to our [facility] walk-in clinic, or go to the nearest Emergency Department.     If you need to schedule future appointments, please call [phone].      We look forward to seeing you again in the future and appreciate you choosing [facility] for your care!     Thank you,  Nat Marcus, NP

## 2024-04-23 MED ORDER — TRAMADOL 50 MG TABLET
ORAL_TABLET | Freq: Four times a day (QID) | ORAL | 0 refills | 30.00000 days | Status: CP | PRN
Start: 2024-04-23 — End: 2024-05-23

## 2024-04-28 DIAGNOSIS — M1990 Unspecified osteoarthritis, unspecified site: Principal | ICD-10-CM

## 2024-04-28 DIAGNOSIS — G8929 Other chronic pain: Principal | ICD-10-CM
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
## 2024-04-29 DIAGNOSIS — M1990 Unspecified osteoarthritis, unspecified site: Principal | ICD-10-CM

## 2024-04-29 DIAGNOSIS — G8929 Other chronic pain: Principal | ICD-10-CM

## 2024-04-29 NOTE — Progress Notes (Signed)
I was the supervising physician in the delivery of the service. Glen Blatchley A Kenasia Scheller, PA
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
## 2024-04-29 NOTE — Progress Notes (Signed)
 Jeraline Setterlund 's [name]  AUTOINJECTOR 162 mg/0.9 mL Pnij (tocilizumab -aazg) shipment will be delayed as a result of insufficient inventory of the drug. 12/11; OOS today, per [place], won't be in stock until next week Mon/Tue)    I have reached out to the patient  at [phone] and left a voicemail message.  We will wait for a call back from the patient to reschedule the delivery.  We have not confirmed the new delivery date.

## 2024-05-03 ENCOUNTER — Inpatient Hospital Stay
Admission: RE | Admit: 2024-05-03 | Discharge: 2024-05-03 | Attending: Orthopedic Surgery | Admitting: Orthopedic Surgery

## 2024-05-03 DIAGNOSIS — M25511 Pain in right shoulder: Secondary | ICD-10-CM

## 2024-05-03 DIAGNOSIS — M25311 Other instability, right shoulder: Secondary | ICD-10-CM

## 2024-05-03 DIAGNOSIS — S46001A Unspecified injury of muscle(s) and tendon(s) of the rotator cuff of right shoulder, initial encounter: Secondary | ICD-10-CM

## 2024-05-06 MED ORDER — HALOPERIDOL 1 MG TABLET
ORAL_TABLET | 2 refills | 0.00000 days
Start: 2024-05-06 — End: ?

## 2024-05-06 NOTE — Progress Notes (Signed)
 Bethany Meyer 's [name]  AUTOINJECTOR 162 mg/0.9 mL Pnij (tocilizumab -aazg) shipment will be canceled as a result of insufficient inventory of the drug.     I have reached out to the patient  at [phone] and communicated the delay. We will not reschedule the medication and have removed this/these medication(s) from the work request.  We have canceled this work request.

## 2024-05-07 DIAGNOSIS — M1990 Unspecified osteoarthritis, unspecified site: Principal | ICD-10-CM

## 2024-05-07 DIAGNOSIS — F17200 Nicotine dependence, unspecified, uncomplicated: Principal | ICD-10-CM

## 2024-05-07 MED ORDER — HALOPERIDOL 1 MG TABLET
ORAL_TABLET | 2 refills | 0.00000 days
Start: 2024-05-07 — End: ?

## 2024-05-07 NOTE — Telephone Encounter (Signed)
 The original prescription was discontinued on 03/02/2024 by Viona Thersia Kurk, MD for the following reason: No Longer Taking. Renewing this prescription may not be appropriate.

## 2024-05-07 NOTE — Telephone Encounter (Signed)
 Copied from CRM #[id number]. Topic: Other - Other  >> May 07, 2024  9:37 AM Gerlene Blackwood wrote:  The PAC has received an incoming clinical call:    Caller name: Alysen Harton   Best callback number: [phone]  Relationship to Patient: self  Describe the reason for the call: Patient advised she was advised to call back and ask for Olam Bone per phone call.Patient advised up to 48business hours turn around time.
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
## 2024-05-10 NOTE — Progress Notes (Signed)
 [facility] [place] Follow-up      Bethany Meyer    Opioid pain medication agreement last signed: 12/23/2023     Most recent Naloxone  (NARCAN ) nasal spray Rx sent: 02/03/2024    Last PDMP Review: 04/12/2024 10:24 AM    Last Opioid Dispensed Provider: Thersia Alycia Angle, MD    Recent Visits  Date Type Provider Dept   04/13/24 Office Visit Vien, Thersia Alycia, MD [facility]   03/31/24 Office Visit Rod Grant Penning, [place] [facility]   03/02/24 Office Visit Vien, Thersia Alycia, MD [facility]       Last Drug Screen Date: 07/08/2023  Urine Toxicology Screen    Lab Results   Component Value Date    AMPHU Negative 07/08/2023    BARBU Negative 07/08/2023    BENZU Negative 07/08/2023    CANNAU Positive (A) 07/08/2023    METHU Negative 07/08/2023    COCAU Negative 07/08/2023    OPIAU Negative 07/08/2023    FENTU Negative 08/22/2020    OXYCOU Negative 07/08/2023    BUPRENORPHIN Negative 07/08/2023        Last Drug Screen Date: 07/11/2023  Opiate Confirmation Test    6-Monoacetylmorphine   Date Value Ref Range Status   07/08/2023 <5 <5 ng/mL Final     Morphine    Date Value Ref Range Status   07/08/2023 <25 <25 ng/mL Final     Codeine   Date Value Ref Range Status   07/08/2023 <25 <25 ng/mL Final     Hydrocodone    Date Value Ref Range Status   07/08/2023 <25 <25 ng/mL Final     Hydromorphone   Date Value Ref Range Status   07/08/2023 <25 <25 ng/mL Final     Oxycodone    Date Value Ref Range Status   07/08/2023 <25 <25 ng/mL Final     Oxymorphone   Date Value Ref Range Status   07/08/2023 <25 <25 ng/mL Final     Buprenorphine    Date Value Ref Range Status   07/08/2023 <5 <5 ng/mL Final     Norbuprenorphine   Date Value Ref Range Status   07/08/2023 <5 <5 ng/mL Final     Fentanyl    Date Value Ref Range Status   07/08/2023 <0.5 <0.5 ng/mL Final     Norfentanyl   Date Value Ref Range Status   07/08/2023 <1.0 <1.0 ng/mL Final Future Appointments  Date Type Provider Dept         07/29/24 Appointment Rod Grant Penning, PA [facility]   Showing future appointments within next 365 days and meeting all other requirements

## 2024-05-12 NOTE — Progress Notes (Signed)
 Brief chart review, unclear indication for brace so as coverage I will defer new order until her pain clinic follow up.

## 2024-05-12 NOTE — Progress Notes (Signed)
I was the supervising physician in the delivery of the service. Jamie Kato, MD

## 2024-05-13 ENCOUNTER — Emergency Department

## 2024-05-13 ENCOUNTER — Inpatient Hospital Stay
Admission: EM | Admit: 2024-05-13 | Discharge: 2024-05-15 | DRG: 392 | Disposition: A | Discharge status: Home / Self Care | Attending: Osteopathic Medicine | Admitting: Osteopathic Medicine

## 2024-05-13 ENCOUNTER — Other Ambulatory Visit: Payer: Self-pay

## 2024-05-13 DIAGNOSIS — R636 Underweight: Secondary | ICD-10-CM | POA: Diagnosis present

## 2024-05-13 DIAGNOSIS — Z7983 Long term (current) use of bisphosphonates: Secondary | ICD-10-CM

## 2024-05-13 DIAGNOSIS — I480 Paroxysmal atrial fibrillation: Secondary | ICD-10-CM | POA: Diagnosis present

## 2024-05-13 DIAGNOSIS — R3915 Urgency of urination: Secondary | ICD-10-CM | POA: Diagnosis present

## 2024-05-13 DIAGNOSIS — W050XXA Fall from non-moving wheelchair, initial encounter: Secondary | ICD-10-CM | POA: Diagnosis present

## 2024-05-13 DIAGNOSIS — Z6820 Body mass index (BMI) 20.0-20.9, adult: Secondary | ICD-10-CM

## 2024-05-13 DIAGNOSIS — R1084 Generalized abdominal pain: Secondary | ICD-10-CM

## 2024-05-13 DIAGNOSIS — G629 Polyneuropathy, unspecified: Secondary | ICD-10-CM | POA: Diagnosis present

## 2024-05-13 DIAGNOSIS — R112 Nausea with vomiting, unspecified: Principal | ICD-10-CM | POA: Diagnosis present

## 2024-05-13 DIAGNOSIS — R1033 Periumbilical pain: Secondary | ICD-10-CM | POA: Diagnosis present

## 2024-05-13 DIAGNOSIS — F32A Depression, unspecified: Secondary | ICD-10-CM | POA: Diagnosis present

## 2024-05-13 DIAGNOSIS — I4891 Unspecified atrial fibrillation: Secondary | ICD-10-CM

## 2024-05-13 DIAGNOSIS — T730XXA Starvation, initial encounter: Secondary | ICD-10-CM

## 2024-05-13 DIAGNOSIS — M069 Rheumatoid arthritis, unspecified: Secondary | ICD-10-CM | POA: Diagnosis present

## 2024-05-13 DIAGNOSIS — Z79899 Other long term (current) drug therapy: Secondary | ICD-10-CM

## 2024-05-13 DIAGNOSIS — Z833 Family history of diabetes mellitus: Secondary | ICD-10-CM

## 2024-05-13 DIAGNOSIS — E860 Dehydration: Secondary | ICD-10-CM | POA: Diagnosis present

## 2024-05-13 DIAGNOSIS — F17210 Nicotine dependence, cigarettes, uncomplicated: Secondary | ICD-10-CM | POA: Diagnosis present

## 2024-05-13 DIAGNOSIS — E8729 Other acidosis: Principal | ICD-10-CM | POA: Diagnosis present

## 2024-05-13 DIAGNOSIS — Z981 Arthrodesis status: Secondary | ICD-10-CM

## 2024-05-13 DIAGNOSIS — G8929 Other chronic pain: Secondary | ICD-10-CM | POA: Diagnosis present

## 2024-05-13 DIAGNOSIS — R1013 Epigastric pain: Secondary | ICD-10-CM | POA: Diagnosis present

## 2024-05-13 LAB — COMPREHENSIVE METABOLIC PANEL WITH GFR
ALT: 40 U/L (ref 0–44)
AST: 96 U/L — ABNORMAL HIGH (ref 15–41)
Albumin: 5.1 g/dL — ABNORMAL HIGH (ref 3.5–5.0)
Alkaline Phosphatase: 110 U/L (ref 38–126)
Anion gap: 18 — ABNORMAL HIGH (ref 5–15)
BUN: 29 mg/dL — ABNORMAL HIGH (ref 8–23)
CO2: 17 mmol/L — ABNORMAL LOW (ref 22–32)
Calcium: 9.6 mg/dL (ref 8.9–10.3)
Chloride: 101 mmol/L (ref 98–111)
Creatinine, Ser: 1.49 mg/dL — ABNORMAL HIGH (ref 0.44–1.00)
GFR, Estimated: 38 mL/min — ABNORMAL LOW
Glucose, Bld: 104 mg/dL — ABNORMAL HIGH (ref 70–99)
Potassium: 3.9 mmol/L (ref 3.5–5.1)
Sodium: 136 mmol/L (ref 135–145)
Total Bilirubin: 0.5 mg/dL (ref 0.0–1.2)
Total Protein: 9.1 g/dL — ABNORMAL HIGH (ref 6.5–8.1)

## 2024-05-13 LAB — CBC
HCT: 47.7 % — ABNORMAL HIGH (ref 36.0–46.0)
Hemoglobin: 16.1 g/dL — ABNORMAL HIGH (ref 12.0–15.0)
MCH: 31.2 pg (ref 26.0–34.0)
MCHC: 33.8 g/dL (ref 30.0–36.0)
MCV: 92.4 fL (ref 80.0–100.0)
Platelets: 217 K/uL (ref 150–400)
RBC: 5.16 MIL/uL — ABNORMAL HIGH (ref 3.87–5.11)
RDW: 12.7 % (ref 11.5–15.5)
WBC: 5.7 K/uL (ref 4.0–10.5)
nRBC: 0 % (ref 0.0–0.2)

## 2024-05-13 LAB — URINALYSIS, ROUTINE W REFLEX MICROSCOPIC
Bilirubin Urine: NEGATIVE
Glucose, UA: 150 mg/dL — AB
Ketones, ur: NEGATIVE mg/dL
Leukocytes,Ua: NEGATIVE
Nitrite: POSITIVE — AB
Protein, ur: NEGATIVE mg/dL
Specific Gravity, Urine: 1.018 (ref 1.005–1.030)
pH: 6 (ref 5.0–8.0)

## 2024-05-13 LAB — BASIC METABOLIC PANEL WITH GFR
Anion gap: 15 (ref 5–15)
BUN: 26 mg/dL — ABNORMAL HIGH (ref 8–23)
CO2: 19 mmol/L — ABNORMAL LOW (ref 22–32)
Calcium: 8.6 mg/dL — ABNORMAL LOW (ref 8.9–10.3)
Chloride: 102 mmol/L (ref 98–111)
Creatinine, Ser: 1.41 mg/dL — ABNORMAL HIGH (ref 0.44–1.00)
GFR, Estimated: 41 mL/min — ABNORMAL LOW
Glucose, Bld: 225 mg/dL — ABNORMAL HIGH (ref 70–99)
Potassium: 3.5 mmol/L (ref 3.5–5.1)
Sodium: 136 mmol/L (ref 135–145)

## 2024-05-13 LAB — TROPONIN T, HIGH SENSITIVITY
Troponin T High Sensitivity: 25 ng/L — ABNORMAL HIGH (ref 0–19)
Troponin T High Sensitivity: 21 ng/L — ABNORMAL HIGH (ref 0–19)

## 2024-05-13 LAB — LIPASE, BLOOD: Lipase: 47 U/L (ref 11–51)

## 2024-05-13 MED ORDER — DILTIAZEM HCL 25 MG/5ML IV SOLN: 10.0000 mg | Freq: Once | INTRAVENOUS | Status: DC

## 2024-05-13 MED ORDER — IOHEXOL 300 MG/ML  SOLN
80.0000 mL | Freq: Once | INTRAMUSCULAR | Status: AC | PRN
  Administered 2024-05-13: 80 mL via INTRAVENOUS

## 2024-05-13 MED ORDER — MORPHINE SULFATE (PF) 4 MG/ML IV SOLN
4.0000 mg | Freq: Once | INTRAVENOUS | Status: AC
  Administered 2024-05-13: 4 mg via INTRAVENOUS
  Filled 2024-05-13: qty 1

## 2024-05-13 MED ORDER — DEXTROSE 5 % IN LACTATED RINGERS IV BOLUS
1000.0000 mL | Freq: Once | INTRAVENOUS | Status: AC
  Administered 2024-05-13: 1000 mL via INTRAVENOUS

## 2024-05-13 MED ORDER — LACTATED RINGERS IV BOLUS
1000.0000 mL | Freq: Once | INTRAVENOUS | Status: AC
  Administered 2024-05-13: 1000 mL via INTRAVENOUS

## 2024-05-13 MED ORDER — MORPHINE SULFATE (PF) 2 MG/ML IV SOLN
2.0000 mg | Freq: Once | INTRAVENOUS | Status: AC
  Administered 2024-05-13: 2 mg via INTRAVENOUS
  Filled 2024-05-13: qty 1

## 2024-05-13 MED ORDER — DILTIAZEM HCL 25 MG/5ML IV SOLN
15.0000 mg | Freq: Once | INTRAVENOUS | Status: AC
  Administered 2024-05-13: 15 mg via INTRAVENOUS
  Filled 2024-05-13: qty 5

## 2024-05-13 NOTE — ED Notes (Signed)
 Provided pt with sponge to wipe mouth as pt stated her mouth was dry. Pt is NPO at this time as stated by RN.

## 2024-05-13 NOTE — ED Triage Notes (Addendum)
 First nurse note: pt to ED [facility] from home for abd cramping, emesis since Sunday. Before first nurse received report from EMS in lobby, pt states reports fall out of w/c while trying to lay head on knees. Pt alert, no LOC noted. Denies head pain. Denies injuries Dr Arlander notified of pt fall. No orders at this time

## 2024-05-13 NOTE — ED Triage Notes (Signed)
 Pt to ED via [facility] from home. Pt reports generalized abd pain that started Sunday and has been getting worse. Pt report N/V. Denies diarrhea. Hx of afib. Denies blood thinners.

## 2024-05-13 NOTE — ED Provider Notes (Signed)
 [name] Provider Note    Event Date/Time   First MD Initiated Contact with Patient 05/13/24 1724     (approximate)   History   Abdominal Pain   HPI  Jenna Davis is a 68 y.o. female with history of A-fib not on anticoagulation, GERD, RA, chronic back pain, history of cyclic vomiting syndrome, history of chronic opiate use, presenting with nausea vomiting and generalized abdominal pain.  Started on Sunday.  No diarrhea.  Patient also reported falling out of the wheelchair.  No LOC.  No headache.  Denies any chest pain, states that sometimes she will feel short of breath after vomiting a bunch.  Denies any shortness of breath at this time.  States that abdominal pain is generalized.  Denies recent marijuana use.  Denies alcohol use.  States that she has been unable to take any of her medications and she will vomit them out.  On independent chart review, patient was seen by primary care in November, has history of chronic pain secondary to RA and idiopathic neuropathy.  Also has history of chronic abdominal pain and cyclic vomiting likely related to cannabis use.     Physical Exam   Triage Vital Signs: ED Triage Vitals [05/13/24 1419]  Encounter Vitals Group     BP (!) 158/116     Girls Systolic BP Percentile      Girls Diastolic BP Percentile      Boys Systolic BP Percentile      Boys Diastolic BP Percentile      Pulse Rate (!) 48     Resp 20     Temp 98.8 F (37.1 C)     Temp Source Oral     SpO2 98 %     Weight      Height      Head Circumference      Peak Flow      Pain Score 10     Pain Loc      Pain Education      Exclude from Growth Chart     Most recent vital signs: Vitals:   05/13/24 2204 05/13/24 2227  BP: 114/82 (!) 94/59  Pulse: 95 86  Resp: 18 18  Temp: 98 F (36.7 C) 98 F (36.7 C)  SpO2: 99% 99%     General: Awake, no distress.  CV:  Good peripheral perfusion.  Resp:  Normal effort.  No tachypnea or respiratory  distress Abd:  No distention.  Soft, nontender, no guarding Other:  Dry mucous membranes, no palpable skull deformities or tenderness, no midline spinal tenderness   ED Results / Procedures / Treatments   Labs (all labs ordered are listed, but only abnormal results are displayed) Labs Reviewed  COMPREHENSIVE METABOLIC PANEL WITH GFR - Abnormal; Notable for the following components:      Result Value   CO2 17 (*)    Glucose, Bld 104 (*)    BUN 29 (*)    Creatinine, Ser 1.49 (*)    Total Protein 9.1 (*)    Albumin  5.1 (*)    AST 96 (*)    GFR, Estimated 38 (*)    Anion gap 18 (*)    All other components within normal limits  URINALYSIS, ROUTINE W REFLEX MICROSCOPIC - Abnormal; Notable for the following components:   Color, Urine STRAW (*)    APPearance CLEAR (*)    Glucose, UA 150 (*)    Hgb urine dipstick SMALL (*)  Nitrite POSITIVE (*)    Bacteria, UA FEW (*)    All other components within normal limits  CBC - Abnormal; Notable for the following components:   RBC 5.16 (*)    Hemoglobin 16.1 (*)    HCT 47.7 (*)    All other components within normal limits  BASIC METABOLIC PANEL WITH GFR - Abnormal; Notable for the following components:   CO2 19 (*)    Glucose, Bld 225 (*)    BUN 26 (*)    Creatinine, Ser 1.41 (*)    Calcium 8.6 (*)    GFR, Estimated 41 (*)    All other components within normal limits  TROPONIN T, HIGH SENSITIVITY - Abnormal; Notable for the following components:   Troponin T High Sensitivity 25 (*)    All other components within normal limits  TROPONIN T, HIGH SENSITIVITY - Abnormal; Notable for the following components:   Troponin T High Sensitivity 21 (*)    All other components within normal limits  LIPASE, BLOOD     EKG  EKG shows, atrial fibrillation, rate 140, QRS is 89, QTc is 497, no obvious ischemic ST elevation, this is changed compared to prior since prior EKG shows sinus rhythm   RADIOLOGY On my independent interpretation, CT  head without obvious intracranial hemorrhage   PROCEDURES:  Critical Care performed: Yes, see critical care procedure note(s)  .Critical Care  Performed by: Waymond Lorelle Cummins, MD Authorized by: Waymond Lorelle Cummins, MD   Critical care provider statement:    Critical care time (minutes):  40   Critical care was time spent personally by me on the following activities:  Development of treatment plan with patient or surrogate, discussions with consultants, evaluation of patient's response to treatment, examination of patient, ordering and review of laboratory studies, ordering and review of radiographic studies, ordering and performing treatments and interventions, pulse oximetry, re-evaluation of patient's condition and review of old charts    MEDICATIONS ORDERED IN ED: Medications  dextrose  5% lactated ringers  bolus 1,000 mL (0 mLs Intravenous Stopped 05/13/24 2032)  lactated ringers  bolus 1,000 mL (0 mLs Intravenous Stopped 05/13/24 2032)  iohexol  (OMNIPAQUE ) 300 MG/ML solution 80 mL (80 mLs Intravenous Contrast Given 05/13/24 1800)  morphine  (PF) 2 MG/ML injection 2 mg (2 mg Intravenous Given 05/13/24 1949)  morphine  (PF) 4 MG/ML injection 4 mg (4 mg Intravenous Given 05/13/24 2118)  diltiazem  (CARDIZEM ) injection 15 mg (15 mg Intravenous Given 05/13/24 2128)     IMPRESSION / MDM / ASSESSMENT AND PLAN / ED COURSE  I reviewed the triage vital signs and the nursing notes.                              Differential diagnosis includes, but is not limited to, dehydration, cyclic vomiting syndrome, colitis, diverticulitis, arrhythmia, UTI, electrolyte derangements.  For the fall, and consider intracranial hemorrhage, fracture, patient has no headache at this time, no blood thinning medication.  Will get CT head, cervical spine, CT Abdo pelvis, labs, IV fluids.  Patient's presentation is most consistent with acute presentation with potential threat to life or bodily function.  Independent  interpretation of labs and imaging below.  Will go course as below.  Patient signed out pending repeat BMP and p.o. challenge.  If improving and tolerating p.o., able to be discharge with outpatient follow-up.  The patient is on the cardiac monitor to evaluate for evidence of arrhythmia and/or significant heart rate changes.  Clinical Course as of 05/13/24 2315  Thu May 13, 2024  1727 Independent review of labs, no leukocytosis, electrolytes not severely deranged, mild AKI, he is acidotic with anion gap, glucose is not significantly elevated, suspect starvation ketoacidosis in the setting of cyclic vomiting.  Lipase is not elevated, AST is mildly elevated, rest of LFTs are normal. [TT]  1831 CT Head Wo Contrast IMPRESSION: 1. No acute intracranial abnormality. 2. No acute fracture or traumatic malalignment of the cervical spine. 3. Chronic left sphenoid sinusitis.   [TT]  1918 CT ABDOMEN PELVIS W CONTRAST IMPRESSION: 1. No acute findings in the abdomen or pelvis. 2. The unchanged gallbladder is distended and fluid-filled. No radiopaque gallstones or wall thickening. 3. Moderate-sized, sliding-type hiatal hernia again noted.   [TT]  2121 Urinalysis, Routine w reflex microscopic -Urine, Clean Catch(!) Shows few bacteria but patient has no urinary symptoms.  Will hold off antibiotics at this time. [TT]  2121 Patient received fluid boluses, still tachycardic to the 140s.  Likely due to inability to take her medications for A-fib.  Will give her a dose of IV Dilt here.  Will repeat her BMP and plan to have her admitted. [TT]  2206 Troponin T High Sensitivity(!): 21 stable [TT]  2207 On reassessment patient heart rate is improving, she converted to sinus rhythm.  Symptoms are improving.  Will repeat BMP. [TT]    Clinical Course User Index [TT] Waymond Lorelle Cummins, MD     FINAL CLINICAL IMPRESSION(S) / ED DIAGNOSES   Final diagnoses:  Generalized abdominal pain  Nausea and vomiting,  unspecified vomiting type  Starvation ketoacidosis  Atrial fibrillation with RVR (HCC)     Rx / DC Orders   ED Discharge Orders     None        Note:  This document was prepared using Dragon voice recognition software and may include unintentional dictation errors.    Waymond Lorelle Cummins, MD 05/13/24 [phone]

## 2024-05-13 NOTE — ED Notes (Addendum)
 Fall risk bracelet placed on pt, pt wearing shoes. Pt in stretcher, locked in lowest position. Denies needs currently

## 2024-05-13 NOTE — ED Notes (Signed)
 Patient converted back to NSR 80's after IV cardizem , EKG repeated and given to Dr Waymond

## 2024-05-14 ENCOUNTER — Emergency Department

## 2024-05-14 DIAGNOSIS — F32A Depression, unspecified: Secondary | ICD-10-CM | POA: Diagnosis present

## 2024-05-14 DIAGNOSIS — R112 Nausea with vomiting, unspecified: Secondary | ICD-10-CM | POA: Diagnosis present

## 2024-05-14 DIAGNOSIS — G894 Chronic pain syndrome: Secondary | ICD-10-CM

## 2024-05-14 DIAGNOSIS — T730XXA Starvation, initial encounter: Secondary | ICD-10-CM | POA: Diagnosis present

## 2024-05-14 DIAGNOSIS — Z7983 Long term (current) use of bisphosphonates: Secondary | ICD-10-CM | POA: Diagnosis not present

## 2024-05-14 DIAGNOSIS — F17210 Nicotine dependence, cigarettes, uncomplicated: Secondary | ICD-10-CM | POA: Diagnosis present

## 2024-05-14 DIAGNOSIS — W050XXA Fall from non-moving wheelchair, initial encounter: Secondary | ICD-10-CM | POA: Diagnosis present

## 2024-05-14 DIAGNOSIS — R636 Underweight: Secondary | ICD-10-CM | POA: Diagnosis present

## 2024-05-14 DIAGNOSIS — R3915 Urgency of urination: Secondary | ICD-10-CM | POA: Diagnosis present

## 2024-05-14 DIAGNOSIS — G8929 Other chronic pain: Secondary | ICD-10-CM

## 2024-05-14 DIAGNOSIS — M069 Rheumatoid arthritis, unspecified: Secondary | ICD-10-CM | POA: Diagnosis present

## 2024-05-14 DIAGNOSIS — Z981 Arthrodesis status: Secondary | ICD-10-CM | POA: Diagnosis not present

## 2024-05-14 DIAGNOSIS — Z833 Family history of diabetes mellitus: Secondary | ICD-10-CM | POA: Diagnosis not present

## 2024-05-14 DIAGNOSIS — F17200 Nicotine dependence, unspecified, uncomplicated: Principal | ICD-10-CM

## 2024-05-14 DIAGNOSIS — M1990 Unspecified osteoarthritis, unspecified site: Principal | ICD-10-CM

## 2024-05-14 DIAGNOSIS — R1013 Epigastric pain: Secondary | ICD-10-CM | POA: Diagnosis present

## 2024-05-14 DIAGNOSIS — G629 Polyneuropathy, unspecified: Secondary | ICD-10-CM

## 2024-05-14 DIAGNOSIS — E860 Dehydration: Secondary | ICD-10-CM | POA: Diagnosis present

## 2024-05-14 DIAGNOSIS — E8729 Other acidosis: Secondary | ICD-10-CM | POA: Diagnosis present

## 2024-05-14 DIAGNOSIS — R1033 Periumbilical pain: Secondary | ICD-10-CM | POA: Diagnosis present

## 2024-05-14 DIAGNOSIS — Z6820 Body mass index (BMI) 20.0-20.9, adult: Secondary | ICD-10-CM | POA: Diagnosis not present

## 2024-05-14 DIAGNOSIS — Z79899 Other long term (current) drug therapy: Secondary | ICD-10-CM | POA: Diagnosis not present

## 2024-05-14 DIAGNOSIS — I480 Paroxysmal atrial fibrillation: Secondary | ICD-10-CM | POA: Diagnosis present

## 2024-05-14 LAB — BASIC METABOLIC PANEL WITH GFR
Anion gap: 11 (ref 5–15)
BUN: 22 mg/dL (ref 8–23)
CO2: 17 mmol/L — ABNORMAL LOW (ref 22–32)
Calcium: 8.2 mg/dL — ABNORMAL LOW (ref 8.9–10.3)
Chloride: 108 mmol/L (ref 98–111)
Creatinine, Ser: 1.22 mg/dL — ABNORMAL HIGH (ref 0.44–1.00)
GFR, Estimated: 48 mL/min — ABNORMAL LOW
Glucose, Bld: 103 mg/dL — ABNORMAL HIGH (ref 70–99)
Potassium: 3.4 mmol/L — ABNORMAL LOW (ref 3.5–5.1)
Sodium: 137 mmol/L (ref 135–145)

## 2024-05-14 LAB — CBC
HCT: 40 % (ref 36.0–46.0)
Hemoglobin: 13.4 g/dL (ref 12.0–15.0)
MCH: 31.5 pg (ref 26.0–34.0)
MCHC: 33.5 g/dL (ref 30.0–36.0)
MCV: 93.9 fL (ref 80.0–100.0)
Platelets: 196 K/uL (ref 150–400)
RBC: 4.26 MIL/uL (ref 3.87–5.11)
RDW: 12.6 % (ref 11.5–15.5)
WBC: 5.3 K/uL (ref 4.0–10.5)
nRBC: 0 % (ref 0.0–0.2)

## 2024-05-14 MED ORDER — NICOTINE 21 MG/24HR TD PT24
21.0000 mg | MEDICATED_PATCH | Freq: Every day | TRANSDERMAL | Status: DC
  Administered 2024-05-14 – 2024-05-15 (×2): 21 mg via TRANSDERMAL
  Filled 2024-05-14 (×2): qty 1

## 2024-05-14 MED ORDER — DICYCLOMINE HCL 20 MG PO TABS
20.0000 mg | ORAL_TABLET | Freq: Four times a day (QID) | ORAL | Status: DC | PRN
  Administered 2024-05-14 – 2024-05-15 (×2): 20 mg via ORAL
  Filled 2024-05-14 (×3): qty 1

## 2024-05-14 MED ORDER — BOOST / RESOURCE BREEZE PO LIQD CUSTOM
1.0000 | Freq: Three times a day (TID) | ORAL | Status: DC
  Administered 2024-05-14 – 2024-05-15 (×4): 1 via ORAL

## 2024-05-14 MED ORDER — SODIUM CHLORIDE 0.9 % IV BOLUS (SEPSIS)
1000.0000 mL | Freq: Once | INTRAVENOUS | Status: AC
  Administered 2024-05-14: 1000 mL via INTRAVENOUS

## 2024-05-14 MED ORDER — MAGNESIUM HYDROXIDE 400 MG/5ML PO SUSP: 30.0000 mL | Freq: Every day | ORAL | Status: DC | PRN

## 2024-05-14 MED ORDER — PANTOPRAZOLE SODIUM 40 MG IV SOLR
40.0000 mg | Freq: Two times a day (BID) | INTRAVENOUS | Status: DC
  Administered 2024-05-14 – 2024-05-15 (×3): 40 mg via INTRAVENOUS
  Filled 2024-05-14 (×3): qty 10

## 2024-05-14 MED ORDER — MORPHINE SULFATE (PF) 4 MG/ML IV SOLN
4.0000 mg | Freq: Once | INTRAVENOUS | Status: AC
  Administered 2024-05-14: 4 mg via INTRAVENOUS
  Filled 2024-05-14: qty 1

## 2024-05-14 MED ORDER — TRAZODONE HCL 50 MG PO TABS
25.0000 mg | ORAL_TABLET | Freq: Every evening | ORAL | Status: DC | PRN
  Administered 2024-05-14: 25 mg via ORAL
  Filled 2024-05-14: qty 1

## 2024-05-14 MED ORDER — ACETAMINOPHEN 325 MG PO TABS: 650.0000 mg | ORAL_TABLET | Freq: Four times a day (QID) | ORAL | Status: DC | PRN

## 2024-05-14 MED ORDER — OXYCODONE HCL 5 MG PO TABS
5.0000 mg | ORAL_TABLET | Freq: Three times a day (TID) | ORAL | Status: DC | PRN
  Administered 2024-05-14 (×3): 5 mg via ORAL
  Filled 2024-05-14 (×3): qty 1

## 2024-05-14 MED ORDER — ADULT MULTIVITAMIN W/MINERALS CH
1.0000 | ORAL_TABLET | Freq: Every day | ORAL | Status: DC
  Administered 2024-05-15: 1 via ORAL
  Filled 2024-05-14: qty 1

## 2024-05-14 MED ORDER — BUPROPION HCL ER (XL) 150 MG PO TB24
150.0000 mg | ORAL_TABLET | Freq: Every morning | ORAL | Status: DC
  Administered 2024-05-14 – 2024-05-15 (×2): 150 mg via ORAL
  Filled 2024-05-14 (×2): qty 1

## 2024-05-14 MED ORDER — HYDROXYZINE HCL 10 MG PO TABS: 10.0000 mg | ORAL_TABLET | Freq: Three times a day (TID) | ORAL | Status: DC | PRN

## 2024-05-14 MED ORDER — FOLIC ACID 1 MG PO TABS
1.0000 mg | ORAL_TABLET | Freq: Every day | ORAL | Status: DC
  Administered 2024-05-14 – 2024-05-15 (×2): 1 mg via ORAL
  Filled 2024-05-14 (×2): qty 1

## 2024-05-14 MED ORDER — ONDANSETRON HCL 4 MG/2ML IJ SOLN: 4.0000 mg | Freq: Four times a day (QID) | INTRAMUSCULAR | Status: DC | PRN

## 2024-05-14 MED ORDER — ONDANSETRON HCL 4 MG PO TABS: 4.0000 mg | ORAL_TABLET | Freq: Four times a day (QID) | ORAL | Status: DC | PRN

## 2024-05-14 MED ORDER — GABAPENTIN 300 MG PO CAPS
1200.0000 mg | ORAL_CAPSULE | Freq: Three times a day (TID) | ORAL | Status: DC
  Administered 2024-05-14 (×3): 1200 mg via ORAL
  Filled 2024-05-14 (×3): qty 4

## 2024-05-14 MED ORDER — DICYCLOMINE HCL 10 MG/ML IM SOLN
20.0000 mg | Freq: Once | INTRAMUSCULAR | Status: AC
  Administered 2024-05-14: 20 mg via INTRAMUSCULAR
  Filled 2024-05-14: qty 2

## 2024-05-14 MED ORDER — ENOXAPARIN SODIUM 40 MG/0.4ML IJ SOSY
40.0000 mg | PREFILLED_SYRINGE | INTRAMUSCULAR | Status: DC
  Administered 2024-05-14: 40 mg via SUBCUTANEOUS
  Filled 2024-05-14 (×2): qty 0.4

## 2024-05-14 MED ORDER — SODIUM CHLORIDE 0.9 % IV SOLN: INTRAVENOUS | Status: AC

## 2024-05-14 MED ORDER — TRAMADOL HCL 50 MG PO TABS
50.0000 mg | ORAL_TABLET | Freq: Three times a day (TID) | ORAL | Status: DC | PRN
  Administered 2024-05-14 (×2): 50 mg via ORAL
  Filled 2024-05-14 (×2): qty 1

## 2024-05-14 MED ORDER — ACETAMINOPHEN 650 MG RE SUPP: 650.0000 mg | Freq: Four times a day (QID) | RECTAL | Status: DC | PRN

## 2024-05-14 MED ORDER — HALOPERIDOL 0.5 MG PO TABS
1.0000 mg | ORAL_TABLET | Freq: Three times a day (TID) | ORAL | Status: DC
  Administered 2024-05-14 (×3): 1 mg via ORAL
  Filled 2024-05-14 (×4): qty 2

## 2024-05-14 NOTE — Progress Notes (Signed)
 Initial Nutrition Assessment  DOCUMENTATION CODES:   Not applicable  INTERVENTION:   Boost Breeze po TID, each supplement provides 250 kcal and 9 grams of protein  MVI po daily   Pt at high refeed risk; recommend monitor potassium, magnesium  and phosphorus labs daily until stable  Daily weights   NUTRITION DIAGNOSIS:   Inadequate oral intake related to acute illness as evidenced by per patient/family report.  GOAL:   Patient will meet greater than or equal to 90% of their needs  MONITOR:   PO intake, Supplement acceptance, Labs, Weight trends, Skin, I & O's  REASON FOR ASSESSMENT:   Malnutrition Screening Tool    ASSESSMENT:   68 y/o female with h/o MDD, RA, neuropathy, chronic pain and GERD who is admitted with intractable nausea and vomiting.  RD working remotely.  Pt with abdominal pain, nausea and vomiting for 5 days pta. Pt with ongoing poor oral intake in hospital. Recommend continue Boost Breeze. RD will add MVI daily. Pt is likely at high refeed risk. Per chart, pt with weight gain pta. RD will obtain exam and history at follow up.   Medications reviewed and include: lovenox , folic acid , nicotine , protonix , NaCl @100ml /hr  Labs reviewed: K 3.4(L), creat 1.22(H)  NUTRITION - FOCUSED PHYSICAL EXAM: Unable to perform at this time   Diet Order:   Diet Order             Diet clear liquid Room service appropriate? Yes; Fluid consistency: Thin  Diet effective now                  EDUCATION NEEDS:   No education needs have been identified at this time  Skin:  Skin Assessment: Reviewed RN Assessment  Last BM:  12/23  Height:   Ht Readings from Last 1 Encounters:  05/13/24 5' 6 (1.676 m)    Weight:   Wt Readings from Last 1 Encounters:  05/13/24 57.6 kg    BMI:  Body mass index is 20.5 kg/m.  Estimated Nutritional Needs:   Kcal:  1500-1700kcal/day  Protein:  75-85g/day  Fluid:  1.5-1.7L/day  Augustin Shams MS, RD, LDN If  unable to be reached, please send secure chat to RD inpatient available from 8:00a-4:00p daily

## 2024-05-14 NOTE — Assessment & Plan Note (Addendum)
-   This is associated with epigastric and periumbilical abdominal pain. - This could be related to acute gastritis. - She will be admitted to a medical telemetry observation bed. - Antiemetics will be provided. - Pain management will be provided. - Will place on clear liquids and advance the diet as tolerated. - Will add IV PPI therapy with Protonix .

## 2024-05-14 NOTE — TOC CM/SW Note (Signed)
 Transition of Care [facility]) CM/SW Note    Transition of Care [facility]) - Inpatient Brief Assessment   Patient Details  Name: Jenna Davis MRN: [id number] Date of Birth: 17-Oct-1955  Transition of Care [facility]) CM/SW Contact:    Alfonso Rummer, LCSW Phone Number: 05/14/2024, 1:34 PM   Clinical Narrative:  Completed toc chart review No active toc needs at this time. Please contact toc should needs arise.   Transition of Care Asessment: Insurance and Status: Insurance coverage has been reviewed Patient has primary care physician: Yes MAZIE, BRITTAIN A) Home environment has been reviewed: single family home   Prior/Current Home Services: No current home services Social Drivers of Health Review: SDOH reviewed no interventions necessary Readmission risk has been reviewed: No Transition of care needs: no transition of care needs at this time

## 2024-05-14 NOTE — Assessment & Plan Note (Signed)
-   Will continue Neurontin .

## 2024-05-14 NOTE — Hospital Course (Addendum)
 Hospital course / significant events:   HPI: Jenna Davis is a 68 y.o. female with medical history significant for paroxysmal atrial fibrillation, GERD, rheumatoid arthritis and depression, who presented to the emergency room with acute onset of intractable nausea and vomiting since yesterday with associated epigastric and periumbilical abdominal pain.  She admitted to low-grade fever of 100 with associated chills.  She has been having urinary urgency without frequency or dysuria or hematuria or flank pain.  She admits to mild cough occasionally productive of light green sputum without dyspnea or wheezing.  She continues to smoke 12 to 14 cigarettes/day,.  She denies any diarrhea or melena or bright red bleeding per rectum.  No bilious vomitus or hematemesis.  No bleeding   12/25: to ED. CT of the abdomen pelvis showed no acute abnormality. Labs suggestive of dehydration but repeat BMP shows improvement with IV fluids.  Likely starvation ketosis. 12/26: On EDP reassessment, new complaint RUQ pain, US  obtained no concerns. Patient immediately began having pain and nausea after drinking. She is tearful, tachycardic. Admitted to hospitalist for intractable N/V and abd pain. [name]  12/27: pt reports feeling better today on regular diet and eager for dc home      Consultants:  none  Procedures/Surgeries: none      ASSESSMENT & PLAN:   Intractable nausea and vomiting Epigastric and periumbilical abdominal pain Hx noted in chart cyclic vomiting syndrome related to cannabis use  Question acute gastritis/gastroenteritis  Question medication effect - opiates + gabapentin  + other CNS agents  Supportive care, antiemetics, IV fluids CLD advance as toelrated but she has not had po intake yet  PPI Pain management:  limit opiates Home dose Buprenorphine  10 mcg/h patch weekly Home dose Tramadol  50 mg po q6h prn Here can have additional oxycodone  for breakthrough pain but limit use / will not continue  indefinitely  Reduce gabapentin  from 900 mg qid / 1200 mg tid --> to 900 mg tid - gabapentin  can also cause nausea at high doses PDMP reviewed tramadol  50 mg #120 x30d filled 11/30 = qid dosing at home Buprenorphine  10 mcg/h patch #4 x28d filled 11/26 Buprenorphine  5 mcg/h patch #4 x28d filled 11/22 Gabapentin  300 mg #360 x30d filled 11/06 = 12 caps/day = 900 mg qid or 1200 mg tid    Rheumatoid arthritis  Chronic pain Neuropathy Outpatient follow up  Pain control as above    Depression Wellbutrin  XL  Seroquel. Haldol         Borderline underweight based on BMI: Body mass index is 20.5 kg/m.[name] Significantly low or high BMI is associated with higher medical risk.  Underweight - under 18  overweight - 25 to 29 obese - 30 or more Class 1 obesity: BMI of 30.0 to 34 Class 2 obesity: BMI of 35.0 to 39 Class 3 obesity: BMI of 40.0 to 49 Super Morbid Obesity: BMI 50-59 Super-super Morbid Obesity: BMI 60+ Healthy nutrition and physical activity advised as adjunct to other disease management and risk reduction treatments    DVT prophylaxis: lovenox  IV fluids: NS continuous IV fluids  Nutrition: CLD Central lines / other devices: none  Code Status: FULL CODE ACP documentation reviewed:  none on file in [facility]  TOC needs: TBD Medical barriers to dispo: po intake, fluids IV. Expected medical readiness for discharge 1-2 days.

## 2024-05-14 NOTE — Progress Notes (Signed)
" °  Brief Progress Note (See full H&P from earlier today)   Subjective: Pt reports persistent pain / nausea but a bit beter from yesterday, does not feel up to having po intake   Objective: Relevant new results:  Physical Exam:  BP 118/82 (BP Location: Left Arm)   Pulse 70   Temp 98.6 F (37 C) (Oral)   Resp 18   Ht 5' 6 (1.676 m)   Wt 57.6 kg   SpO2 100%   BMI 20.50 kg/m  Constitutional:  General Appearance: alert Respiratory: Normal respiratory effort Breath sounds normal, no wheeze/rhonchi/rales Cardiovascular: S1/S2 normal, no murmur/rub/gallop auscultated No lower extremity edema Gastrointestinal: (+)TTP epigastric area, LLQ no masses BS WNL Musculoskeletal:  No clubbing/cyanosis of digits Neurological: No cranial nerve deficit on limited exam Psychiatric: Normal judgment/insight Normal mood and affect   Assessment/Plan changes or updates compared to H&P: None - continue IV fluids, pt advised if she'd like to try po intake she is welcome to           "

## 2024-05-14 NOTE — Assessment & Plan Note (Addendum)
-   Will continue her pain regimen.

## 2024-05-14 NOTE — H&P (Addendum)
 "     [place]   PATIENT NAME: Jenna Davis    MR#:  [id number]  DATE OF BIRTH:  1956-04-13  DATE OF ADMISSION:  05/13/2024  PRIMARY CARE PHYSICIAN: Rod Grant LABOR, PA   Patient is coming from: Home  REQUESTING/REFERRING PHYSICIAN: Ward, Josette SAILOR, DO  CHIEF COMPLAINT:   Chief Complaint  Patient presents with   Abdominal Pain    HISTORY OF PRESENT ILLNESS:  Brandelyn Parrales is a 68 y.o. female with medical history significant for paroxysmal atrial fibrillation, GERD, rheumatoid arthritis and depression, who presented to the emergency room with acute onset of intractable nausea and vomiting since yesterday with associated epigastric and periumbilical abdominal pain.  She admitted to low-grade fever of 100 with associated chills.  She has been having urinary urgency without frequency or dysuria or hematuria or flank pain.  She admits to mild cough occasionally productive of light green sputum without dyspnea or wheezing.  She continues to smoke 12 to 14 cigarettes/day,.  She denies any diarrhea or melena or bright red bleeding per rectum.  No bilious vomitus or hematemesis.  No bleeding diathesis.  ED Course: When she came to the ER, heart rate was 110 with otherwise normal vital signs.  Labs revealed BUN of 29 creatinine 1.49 above previous levels and CO2 17 with anion gap of 18.  LFTs revealed AST of 96 and albumin  of 5.1 with total protein of 9.1.  High sensitive troponin T was 25 and later 21.  CBC showed hemoglobin 16.1 hematocrit 47.7.  UA was negative.  EKG as reviewed by me : Initial EKG showed sinus rhythm with rate of 99 with premature supraventricular complexes and early repolarization. Imaging: Abdominal and pelvic CT scan with contrast revealed the following: 1. No acute findings in the abdomen or pelvis. 2. The unchanged gallbladder is distended and fluid-filled. No radiopaque gallstones or wall thickening. 3. Moderate-sized, sliding-type hiatal hernia again  noted.  Noncontrast head CT scan revealed no acute intracranial normalities.  C-spine CT showed no acute findings.  It showed chronic left sphenoid sinusitis.  Right upper quadrant ultrasound showed the following: 1. Distended gallbladder but no gallstones or gallbladder wall thickening. Gallbladder distension has been present on prior exams. 2. Trace pericholecystic fluid. 3. No biliary dilatation. Normal sonographic appearance of the liver.  The patient was given 1 L bolus of IV lactated ringer , 2 mg of IV morphine  sulfate and another couple doses of 4 mg, 1 L bolus of IV normal saline and D5 LR 1 L as well as Bentyl  20 mg IM.  She will be admitted to cardiac telemetry observation bed for further evaluation and management. PAST MEDICAL HISTORY:   Past Medical History:  Diagnosis Date   A-fib (HCC)    Depression    GERD (gastroesophageal reflux disease)    RA (rheumatoid arthritis) (HCC)    Rheumatoid arthritis (HCC)     PAST SURGICAL HISTORY:   Past Surgical History:  Procedure Laterality Date   ABDOMINAL EXPOSURE N/A 10/11/2020   Procedure: ABDOMINAL EXPOSURE;  Surgeon: Gretta Lonni PARAS, MD;  Location: [facility] OR;  Service: Vascular;  Laterality: N/A;   ANTERIOR LUMBAR FUSION N/A 10/11/2020   Procedure: ANTERIOR LUMBAR INTERBODY FUSION LUMBAR 4 - LUMBAR 5 WITH POSTERIOR SPINAL FUSION WITH INSTRUMENTATION AND ALLOGRAFT;  Surgeon: Beuford Anes, MD;  Location: [facility] OR;  Service: Orthopedics;  Laterality: N/A;   ATRIAL FIBRILLATION ABLATION     OVARIAN CYST REMOVAL     SHOULDER CLOSED REDUCTION Right 02/18/2024  Procedure: CLOSED REDUCTION, SHOULDER;  Surgeon: Tobie Priest, MD;  Location: [facility] ORS;  Service: Orthopedics;  Laterality: Right;    SOCIAL HISTORY:   Social History   Tobacco Use   Smoking status: Every Day    Current packs/day: 0.50    Types: Cigarettes   Smokeless tobacco: Never  Substance Use Topics   Alcohol use: No    FAMILY HISTORY:   Family History   Problem Relation Age of Onset   Diabetes Mother    Breast cancer Mother    Diabetes Father     DRUG ALLERGIES:  Allergies[1]  REVIEW OF SYSTEMS:   ROS As per history of present illness. All pertinent systems were reviewed above. Constitutional, HEENT, cardiovascular, respiratory, GI, GU, musculoskeletal, neuro, psychiatric, endocrine, integumentary and hematologic systems were reviewed and are otherwise negative/unremarkable except for positive findings mentioned above in the HPI.   MEDICATIONS AT HOME:   Prior to Admission medications  Medication Sig Start Date End Date Taking? Authorizing Provider  acetaminophen  (TYLENOL ) 325 MG tablet Take 2 tablets (650 mg total) by mouth every 6 (six) hours as needed for mild pain (pain score 1-3) (or Fever >/= 101). 02/19/24   Jerelene Critchley, MD  alendronate (FOSAMAX) 70 MG tablet Take 70 mg by mouth once a week.    [provider]  buPROPion  (WELLBUTRIN  XL) 150 MG 24 hr tablet Take 150 mg by mouth every morning. 08/25/20   [provider]  Cholecalciferol  (D3 PO) Take 1 capsule by mouth daily.    [provider]  cyclobenzaprine  (FLEXERIL ) 5 MG tablet Take 5-10 mg by mouth at bedtime as needed for pain. 09/06/20   [provider]  dicyclomine  (BENTYL ) 20 MG tablet Take 20 mg by mouth 4 (four) times daily as needed for other. Stomach cramps 09/11/20   [provider]  folic acid  (FOLVITE ) 1 MG tablet Take 1 mg by mouth daily. 02/03/15   [provider]  gabapentin  (NEURONTIN ) 300 MG capsule Take 900 mg by mouth 3 (three) times daily. 04/06/18   [provider]  haloperidol  (HALDOL ) 0.5 MG tablet Take 0.5 mg by mouth every morning. 09/19/20   [provider]  hydrOXYzine  (ATARAX /VISTARIL ) 10 MG tablet Take 10 mg by mouth 3 (three) times daily as needed for itching. 09/11/20   [provider]  lidocaine  (LIDODERM ) 5 % Place 2 patches onto the skin daily. Remove & Discard  patch within 12 hours or as directed by MD 02/19/24   Jerelene Critchley, MD  methocarbamol  (ROBAXIN ) 500 MG tablet Take 1 tablet (500 mg total) by mouth every 6 (six) hours as needed for muscle spasms. 10/12/20   Beuford Anes, MD  nicotine  (NICODERM CQ  - DOSED IN MG/24 HOURS) 21 mg/24hr patch Place 1 patch (21 mg total) onto the skin daily. 02/20/24   Jerelene Critchley, MD  ondansetron  (ZOFRAN ) 4 MG tablet Take 1 tablet (4 mg total) by mouth every 6 (six) hours as needed for nausea. 02/19/24   Jerelene Critchley, MD  OTREXUP  25 MG/0.4ML SOAJ Inject 25 mg into the skin once a week. 02/23/24   Jerelene Critchley, MD  oxyCODONE  (ROXICODONE ) 5 MG immediate release tablet Take 1 tablet (5 mg total) by mouth every 8 (eight) hours as needed. 03/26/24 03/26/25  Margrette, Myah A, PA-C  polyethylene glycol (MIRALAX  / GLYCOLAX ) 17 g packet Take 17 g by mouth 2 (two) times daily. 02/19/24   Jerelene Critchley, MD  traMADol  (ULTRAM ) 50 MG tablet Take 50 mg by mouth every  8 (eight) hours as needed for pain. 09/29/20   [provider]  traMADol  (ULTRAM ) 50 MG tablet Take 1 tablet (50 mg total) by mouth every 8 (eight) hours as needed for moderate pain (pain score 4-6). 02/19/24   Verlinda Boas, PA-C      VITAL SIGNS:  Blood pressure (!) 123/90, pulse 74, temperature 97.7 F (36.5 C), resp. rate 18, height 5' 6 (1.676 m), weight 57.6 kg, SpO2 100%.  PHYSICAL EXAMINATION:  Physical Exam  GENERAL:  68 y.o.-year-old patient lying in the bed with no acute distress.  EYES: Pupils equal, round, reactive to light and accommodation. No scleral icterus. Extraocular muscles intact.  HEENT: Head atraumatic, normocephalic. Oropharynx and nasopharynx clear.  NECK:  Supple, no jugular venous distention. No thyroid enlargement, no tenderness.  LUNGS: Normal breath sounds bilaterally, no wheezing, rales,rhonchi or crepitation. No use of accessory muscles of respiration.  CARDIOVASCULAR: Regular rate and rhythm, S1, S2 normal. No  murmurs, rubs, or gallops.  ABDOMEN: Soft, nondistended, with epigastric and periumbilical tenderness without event tenderness guarding or rigidity. Bowel sounds present. No organomegaly or mass.  EXTREMITIES: No pedal edema, cyanosis, or clubbing.  NEUROLOGIC: Cranial nerves II through XII are intact. Muscle strength 5/5 in all extremities. Sensation intact. Gait not checked.  PSYCHIATRIC: The patient is alert and oriented x 3.  Normal affect and good eye contact. SKIN: No obvious rash, lesion, or ulcer.   LABORATORY PANEL:   CBC Recent Labs  Lab 05/14/24 0558  WBC 5.3  HGB 13.4  HCT 40.0  PLT 196   ------------------------------------------------------------------------------------------------------------------  Chemistries  Recent Labs  Lab 05/13/24 1430 05/13/24 2034  NA 136 136  K 3.9 3.5  CL 101 102  CO2 17* 19*  GLUCOSE 104* 225*  BUN 29* 26*  CREATININE 1.49* 1.41*  CALCIUM 9.6 8.6*  AST 96*  --   ALT 40  --   ALKPHOS 110  --   BILITOT 0.5  --    ------------------------------------------------------------------------------------------------------------------  Cardiac Enzymes No results for input(s): TROPONINI in the last 168 hours. ------------------------------------------------------------------------------------------------------------------  RADIOLOGY:  US  ABDOMEN LIMITED RUQ (LIVER/GB) Result Date: 05/14/2024 CLINICAL DATA:  Right-sided abdominal pain. EXAM: ULTRASOUND ABDOMEN LIMITED RIGHT UPPER QUADRANT COMPARISON:  CT yesterday FINDINGS: Gallbladder: The gallbladder is prominently distended spanning 14 cm in length. No gallstones or wall thickening visualized. Trace pericholecystic fluid. No sonographic Murphy sign noted by sonographer. Common bile duct: Diameter: 5 mm, normal. Liver: No focal lesion identified. Within normal limits in parenchymal echogenicity. Portal vein is patent on color Doppler imaging with normal direction of blood flow towards  the liver. Other: None. IMPRESSION: 1. Distended gallbladder but no gallstones or gallbladder wall thickening. Gallbladder distension has been present on prior exams. 2. Trace pericholecystic fluid. 3. No biliary dilatation. Normal sonographic appearance of the liver. Electronically Signed   By: Andrea Gasman M.D.   On: 05/14/2024 01:45   CT ABDOMEN PELVIS W CONTRAST Result Date: 05/13/2024 EXAM: CT ABDOMEN AND PELVIS WITH CONTRAST 05/13/2024 06:12:53 PM TECHNIQUE: CT of the abdomen and pelvis was performed with the administration of 80 mL iohexol  (OMNIPAQUE ) 300 MG/ML solution. Multiplanar reformatted images are provided for review. Automated exposure control, iterative reconstruction, and/or weight-based adjustment of the mA/kV was utilized to reduce the radiation dose to as low as reasonably achievable. COMPARISON: 02/15/2024 CLINICAL HISTORY: Abdominal pain, acute, nonlocalized. FINDINGS: LOWER CHEST: Moderate sized sliding type hiatal hernia. LIVER: Focal fatty infiltration along the falciform ligament. GALLBLADDER AND BILE DUCTS: Unchanged, distended and fluid filled  gallbladder without radiopaque stones or wall thickening. No biliary ductal dilatation. SPLEEN: No acute abnormality. PANCREAS: No acute abnormality. ADRENAL GLANDS: No acute abnormality. KIDNEYS, URETERS AND BLADDER: No stones in the kidneys or ureters. No hydronephrosis. No perinephric or periureteral stranding. Urinary bladder is unremarkable. GI AND BOWEL: Stomach demonstrates no acute abnormality. There is no bowel obstruction. PERITONEUM AND RETROPERITONEUM: No ascites. No free air. VASCULATURE: Right coronary artery atherosclerosis. Atherosclerosis throughout the aorta and iliac arteries. LYMPH NODES: No lymphadenopathy. REPRODUCTIVE ORGANS: No acute abnormality. BONES AND SOFT TISSUES: Diffuse osteopenia. Lumbar fusion hardware at L4-L5 again noted with intervertebral disc spacer noted. Moderate multilevel degenerative disc disease  of the thoracolumbar spine. No focal soft tissue abnormality. IMPRESSION: 1. No acute findings in the abdomen or pelvis. 2. The unchanged gallbladder is distended and fluid-filled. No radiopaque gallstones or wall thickening. 3. Moderate-sized, sliding-type hiatal hernia again noted. Electronically signed by: Rogelia Myers MD 05/13/2024 06:33 PM EST RP Workstation: [id number]   CT Head Wo Contrast Result Date: 05/13/2024 EXAM: CT HEAD AND CERVICAL SPINE 05/13/2024 06:12:53 PM TECHNIQUE: CT of the head and cervical spine was performed without the administration of intravenous contrast. Multiplanar reformatted images are provided for review. Automated exposure control, iterative reconstruction, and/or weight based adjustment of the mA/kV was utilized to reduce the radiation dose to as low as reasonably achievable. COMPARISON: CT head and CT cervical spine 03/26/2024. CLINICAL HISTORY: Head trauma, minor (Age >= 65y) FINDINGS: CT HEAD BRAIN AND VENTRICLES: No acute intracranial hemorrhage. No mass effect or midline shift. No abnormal extra-axial fluid collection. No evidence of acute infarct. No hydrocephalus. ORBITS: No acute abnormality. SINUSES AND MASTOIDS: Chronic left sphenoid sinusitis with near-total opacification and surrounding osteitis. SOFT TISSUES AND SKULL: No acute skull fracture. No acute soft tissue abnormality. CT CERVICAL SPINE BONES AND ALIGNMENT: No acute fracture or traumatic malalignment. DEGENERATIVE CHANGES: Moderate multilevel degenerative disc disease including disc height loss and endplate spurring. Facet and uncovertebral hypertrophy contributes to varying degrees of neural foraminal stenosis, greatest on the right at C5-C6. SOFT TISSUES: No prevertebral soft tissue swelling. IMPRESSION: 1. No acute intracranial abnormality. 2. No acute fracture or traumatic malalignment of the cervical spine. 3. Chronic left sphenoid sinusitis. Electronically signed by: Gilmore Molt 05/13/2024  06:28 PM EST RP Workstation: [id number]   CT Cervical Spine Wo Contrast Result Date: 05/13/2024 EXAM: CT HEAD AND CERVICAL SPINE 05/13/2024 06:12:53 PM TECHNIQUE: CT of the head and cervical spine was performed without the administration of intravenous contrast. Multiplanar reformatted images are provided for review. Automated exposure control, iterative reconstruction, and/or weight based adjustment of the mA/kV was utilized to reduce the radiation dose to as low as reasonably achievable. COMPARISON: CT head and CT cervical spine 03/26/2024. CLINICAL HISTORY: Head trauma, minor (Age >= 65y) FINDINGS: CT HEAD BRAIN AND VENTRICLES: No acute intracranial hemorrhage. No mass effect or midline shift. No abnormal extra-axial fluid collection. No evidence of acute infarct. No hydrocephalus. ORBITS: No acute abnormality. SINUSES AND MASTOIDS: Chronic left sphenoid sinusitis with near-total opacification and surrounding osteitis. SOFT TISSUES AND SKULL: No acute skull fracture. No acute soft tissue abnormality. CT CERVICAL SPINE BONES AND ALIGNMENT: No acute fracture or traumatic malalignment. DEGENERATIVE CHANGES: Moderate multilevel degenerative disc disease including disc height loss and endplate spurring. Facet and uncovertebral hypertrophy contributes to varying degrees of neural foraminal stenosis, greatest on the right at C5-C6. SOFT TISSUES: No prevertebral soft tissue swelling. IMPRESSION: 1. No acute intracranial abnormality. 2. No acute fracture or traumatic malalignment of the cervical spine.  3. Chronic left sphenoid sinusitis. Electronically signed by: Gilmore Molt 05/13/2024 06:28 PM EST RP Workstation: [id number]      IMPRESSION AND PLAN:  Assessment and Plan: * Intractable nausea and vomiting - This is associated with epigastric and periumbilical abdominal pain. - This could be related to acute gastritis. - She will be admitted to a medical telemetry observation bed. - Antiemetics will be  provided. - Pain management will be provided. - Will place on clear liquids and advance the diet as tolerated. - Will add IV PPI therapy with Protonix .  Rheumatoid arthritis (HCC) - This is apparently in remission.  Depression - Will continue Wellbutrin  XL and Seroquel.  Peripheral neuropathy - Will continue Neurontin .  Chronic pain - Will continue her pain regimen.    DVT prophylaxis: Lovenox .  Advanced Care Planning:  Code Status: full code.  Family Communication:  The plan of care was discussed in details with the patient (and family). I answered all questions. The patient agreed to proceed with the above mentioned plan. Further management will depend upon hospital course. Disposition Plan: Back to previous home environment Consults called: none.  All the records are reviewed and case discussed with ED provider.  Status is: Observation  I certify that at the time of admission, it is my clinical judgment that the patient will require hospital care extending less than 2 midnights.                            Dispo: The patient is from: Home              Anticipated d/c is to: Home              Patient currently is not medically stable to d/c.              Difficult to place patient: No  Madison DELENA Peaches M.D on 05/14/2024 at 7:10 AM  [facility]   From 7 PM-7 AM, contact night-coverage [email or web address]  CC: Primary care physician; Rod Batter A, PA     [1] No Known Allergies  "

## 2024-05-14 NOTE — Care Management Obs Status (Signed)
 MEDICARE OBSERVATION STATUS NOTIFICATION   Patient Details  Name: Jenna Davis MRN: [id number] Date of Birth: 05-02-1956   Medicare Observation Status Notification Given:  Yes    Rojelio SHAUNNA Rattler 05/14/2024, 4:41 PM

## 2024-05-14 NOTE — ED Provider Notes (Signed)
 12:00 AM  Assumed care at shift change.  Patient here with complaints of abdominal pain, nausea and vomiting with history of cyclic vomiting syndrome and chronic opiate use.  CT of the abdomen pelvis showed no acute abnormality.  Labs suggestive of dehydration but repeat BMP shows improvement with IV fluids.  Likely starvation ketosis.  On my reassessment patient is now complaining of right upper quadrant abdominal pain.  She does have some gallbladder distention on CT scan.  No stones or pericholecystic fluid or wall thickening.  Will perform ultrasound.   3:00 AM Pt's ultrasound reviewed and interpreted by myself and the radiologist and shows no gallstones or cholecystitis.  Patient immediately began having pain and nausea after drinking.  She is tearful, tachycardic.  Will give Bentyl .  Will continue fluids and antiemetics.  Will admit for intractable symptoms, dehydration.  Consulted and discussed patient's case with hospitalist, Dr. Lawence.  I have recommended admission and consulting physician agrees and will place admission orders.  Patient (and family if present) agree with this plan.   I reviewed all nursing notes, vitals, pertinent previous records.  All labs, EKGs, imaging ordered have been independently reviewed and interpreted by myself.    Samiha Denapoli, Josette SAILOR, DO 05/14/24 [phone]

## 2024-05-14 NOTE — Assessment & Plan Note (Signed)
-   Will continue Wellbutrin  XL and Seroquel.

## 2024-05-14 NOTE — Progress Notes (Incomplete)
 " PROGRESS NOTE    Jenna Davis   FMW:[id number] DOB: 23-Oct-1955  DOA: 05/13/2024 Date of Service: 05/14/2024 which is hospital day 0  PCP: Rod Grant LABOR, [facility] course / significant events:   HPI: Jenna Davis is a 68 y.o. female with medical history significant for paroxysmal atrial fibrillation, GERD, rheumatoid arthritis and depression, who presented to the emergency room with acute onset of intractable nausea and vomiting since yesterday with associated epigastric and periumbilical abdominal pain.  She admitted to low-grade fever of 100 with associated chills.  She has been having urinary urgency without frequency or dysuria or hematuria or flank pain.  She admits to mild cough occasionally productive of light green sputum without dyspnea or wheezing.  She continues to smoke 12 to 14 cigarettes/day,.  She denies any diarrhea or melena or bright red bleeding per rectum.  No bilious vomitus or hematemesis.  No bleeding   12/25: to ED. CT of the abdomen pelvis showed no acute abnormality. Labs suggestive of dehydration but repeat BMP shows improvement with IV fluids.  Likely starvation ketosis. 12/26: On EDP reassessment, new complaint RUQ pain, US  obtained no concerns. Patient immediately began having pain and nausea after drinking. She is tearful, tachycardic. Admitted to hospitalist for intractable N/V and abd pain. .      Consultants:  ***  Procedures/Surgeries: ***      ASSESSMENT & PLAN:   Intractable nausea and vomiting - This is associated with epigastric and periumbilical abdominal pain. - This could be related to acute gastritis. - She will be admitted to a medical telemetry observation bed. - Antiemetics will be provided. - Pain management will be provided. - Will place on clear liquids and advance the diet as tolerated. - Will add IV PPI therapy with Protonix .   Rheumatoid arthritis (HCC) - This is apparently in remission.   Depression -  Will continue Wellbutrin  XL and Seroquel.   Peripheral neuropathy - Will continue Neurontin .   Chronic pain - Will continue her pain regimen. PDMP reviewed - tramadol  50 mg #120 x30d filled 11/30 = qid dosing at home Buprenorphine 10 mcg/h patch #4 x28d filled 11/26 Buprenorphine 5 mcg/h patch #4 x28d filled 11/22     *** based on BMI: Body mass index is 20.5 kg/m.[name] Significantly low or high BMI is associated with higher medical risk.  Underweight - under 18  overweight - 25 to 29 obese - 30 or more Class 1 obesity: BMI of 30.0 to 34 Class 2 obesity: BMI of 35.0 to 39 Class 3 obesity: BMI of 40.0 to 49 Super Morbid Obesity: BMI 50-59 Super-super Morbid Obesity: BMI 60+ Healthy nutrition and physical activity advised as adjunct to other disease management and risk reduction treatments    DVT prophylaxis: *** IV fluids: *** continuous IV fluids  Nutrition: *** Central lines / other devices: ***  Code Status: *** ACP documentation reviewed: *** none on file in [facility]  TOC needs: *** Medical barriers to dispo: ***. Expected medical readiness for discharge ***.              Subjective / Brief ROS:  Patient reports *** Denies CP/SOB.  Pain controlled.  Denies new weakness.  Tolerating diet ***.  Reports no concerns w/ urination/defecation.   Family Communication: ***    Objective Findings:  Vitals:   05/14/24 0242 05/14/24 0330 05/14/24 0406 05/14/24 0828  BP: 101/68 102/74 (!) 123/90 117/70  Pulse: 81 74 74 67  Resp: 14 13 18  18  Temp: 98.4 F (36.9 C)  97.7 F (36.5 C) 98 F (36.7 C)  TempSrc: Oral     SpO2: 99% 99% 100% 97%  Weight:      Height:        Intake/Output Summary (Last 24 hours) at 05/14/2024 9071 Last data filed at 05/14/2024 0124 Gross per 24 hour  Intake 3000 ml  Output --  Net 3000 ml   Filed Weights   05/13/24 1926  Weight: 57.6 kg    Examination:  Physical Exam       Scheduled Medications:   buPROPion    150 mg Oral q morning   enoxaparin  (LOVENOX ) injection  40 mg Subcutaneous Q24H   feeding supplement  1 Container Oral TID BM   folic acid   1 mg Oral Daily   gabapentin   1,200 mg Oral TID   haloperidol   1 mg Oral TID   nicotine   21 mg Transdermal Daily   pantoprazole  (PROTONIX ) IV  40 mg Intravenous Q12H    Continuous Infusions:  sodium chloride  100 mL/hr at 05/14/24 0434    PRN Medications:  acetaminophen  **OR** acetaminophen , dicyclomine , hydrOXYzine , magnesium  hydroxide, ondansetron  **OR** ondansetron  (ZOFRAN ) IV, oxyCODONE , traMADol , traZODone   Antimicrobials from admission:  Anti-infectives (From admission, onward)    None           Data Reviewed:  I have personally reviewed the following...  CBC: Recent Labs  Lab 05/13/24 1540 05/14/24 0558  WBC 5.7 5.3  HGB 16.1* 13.4  HCT 47.7* 40.0  MCV 92.4 93.9  PLT 217 196   Basic Metabolic Panel: Recent Labs  Lab 05/13/24 1430 05/13/24 2034 05/14/24 0558  NA 136 136 137  K 3.9 3.5 3.4*  CL 101 102 108  CO2 17* 19* 17*  GLUCOSE 104* 225* 103*  BUN 29* 26* 22  CREATININE 1.49* 1.41* 1.22*  CALCIUM 9.6 8.6* 8.2*   GFR: Estimated Creatinine Clearance: 40.7 mL/min (A) (by C-G formula based on SCr of 1.22 mg/dL (H)). Liver Function Tests: Recent Labs  Lab 05/13/24 1430  AST 96*  ALT 40  ALKPHOS 110  BILITOT 0.5  PROT 9.1*  ALBUMIN  5.1*   Recent Labs  Lab 05/13/24 1430  LIPASE 47   No results for input(s): AMMONIA in the last 168 hours. Coagulation Profile: No results for input(s): INR, PROTIME in the last 168 hours. Cardiac Enzymes: No results for input(s): CKTOTAL, CKMB, CKMBINDEX, TROPONINI in the last 168 hours. BNP (last 3 results) No results for input(s): PROBNP in the last 8760 hours. HbA1C: No results for input(s): HGBA1C in the last 72 hours. CBG: No results for input(s): GLUCAP in the last 168 hours. Lipid Profile: No results for input(s): CHOL, HDL,  LDLCALC, TRIG, CHOLHDL, LDLDIRECT in the last 72 hours. Thyroid Function Tests: No results for input(s): TSH, T4TOTAL, FREET4, T3FREE, THYROIDAB in the last 72 hours. Anemia Panel: No results for input(s): VITAMINB12, FOLATE, FERRITIN, TIBC, IRON, RETICCTPCT in the last 72 hours. Most Recent Urinalysis On File:     Component Value Date/Time   COLORURINE STRAW (A) 05/13/2024 2034   APPEARANCEUR CLEAR (A) 05/13/2024 2034   LABSPEC 1.018 05/13/2024 2034   PHURINE 6.0 05/13/2024 2034   GLUCOSEU 150 (A) 05/13/2024 2034   HGBUR SMALL (A) 05/13/2024 2034   BILIRUBINUR NEGATIVE 05/13/2024 2034   KETONESUR NEGATIVE 05/13/2024 2034   PROTEINUR NEGATIVE 05/13/2024 2034   NITRITE POSITIVE (A) 05/13/2024 2034   LEUKOCYTESUR NEGATIVE 05/13/2024 2034   Sepsis Labs: @LABRCNTIP (procalcitonin:4,lacticidven:4) Microbiology: No results found  for this or any previous visit (from the past 240 hours).    Radiology Studies last 3 days: US  ABDOMEN LIMITED RUQ (LIVER/GB) Result Date: 05/14/2024 CLINICAL DATA:  Right-sided abdominal pain. EXAM: ULTRASOUND ABDOMEN LIMITED RIGHT UPPER QUADRANT COMPARISON:  CT yesterday FINDINGS: Gallbladder: The gallbladder is prominently distended spanning 14 cm in length. No gallstones or wall thickening visualized. Trace pericholecystic fluid. No sonographic Murphy sign noted by sonographer. Common bile duct: Diameter: 5 mm, normal. Liver: No focal lesion identified. Within normal limits in parenchymal echogenicity. Portal vein is patent on color Doppler imaging with normal direction of blood flow towards the liver. Other: None. IMPRESSION: 1. Distended gallbladder but no gallstones or gallbladder wall thickening. Gallbladder distension has been present on prior exams. 2. Trace pericholecystic fluid. 3. No biliary dilatation. Normal sonographic appearance of the liver. Electronically Signed   By: Andrea Gasman M.D.   On: 05/14/2024 01:45   CT  ABDOMEN PELVIS W CONTRAST Result Date: 05/13/2024 EXAM: CT ABDOMEN AND PELVIS WITH CONTRAST 05/13/2024 06:12:53 PM TECHNIQUE: CT of the abdomen and pelvis was performed with the administration of 80 mL iohexol  (OMNIPAQUE ) 300 MG/ML solution. Multiplanar reformatted images are provided for review. Automated exposure control, iterative reconstruction, and/or weight-based adjustment of the mA/kV was utilized to reduce the radiation dose to as low as reasonably achievable. COMPARISON: 02/15/2024 CLINICAL HISTORY: Abdominal pain, acute, nonlocalized. FINDINGS: LOWER CHEST: Moderate sized sliding type hiatal hernia. LIVER: Focal fatty infiltration along the falciform ligament. GALLBLADDER AND BILE DUCTS: Unchanged, distended and fluid filled gallbladder without radiopaque stones or wall thickening. No biliary ductal dilatation. SPLEEN: No acute abnormality. PANCREAS: No acute abnormality. ADRENAL GLANDS: No acute abnormality. KIDNEYS, URETERS AND BLADDER: No stones in the kidneys or ureters. No hydronephrosis. No perinephric or periureteral stranding. Urinary bladder is unremarkable. GI AND BOWEL: Stomach demonstrates no acute abnormality. There is no bowel obstruction. PERITONEUM AND RETROPERITONEUM: No ascites. No free air. VASCULATURE: Right coronary artery atherosclerosis. Atherosclerosis throughout the aorta and iliac arteries. LYMPH NODES: No lymphadenopathy. REPRODUCTIVE ORGANS: No acute abnormality. BONES AND SOFT TISSUES: Diffuse osteopenia. Lumbar fusion hardware at L4-L5 again noted with intervertebral disc spacer noted. Moderate multilevel degenerative disc disease of the thoracolumbar spine. No focal soft tissue abnormality. IMPRESSION: 1. No acute findings in the abdomen or pelvis. 2. The unchanged gallbladder is distended and fluid-filled. No radiopaque gallstones or wall thickening. 3. Moderate-sized, sliding-type hiatal hernia again noted. Electronically signed by: Rogelia Myers MD 05/13/2024 06:33 PM  EST RP Workstation: [id number]   CT Head Wo Contrast Result Date: 05/13/2024 EXAM: CT HEAD AND CERVICAL SPINE 05/13/2024 06:12:53 PM TECHNIQUE: CT of the head and cervical spine was performed without the administration of intravenous contrast. Multiplanar reformatted images are provided for review. Automated exposure control, iterative reconstruction, and/or weight based adjustment of the mA/kV was utilized to reduce the radiation dose to as low as reasonably achievable. COMPARISON: CT head and CT cervical spine 03/26/2024. CLINICAL HISTORY: Head trauma, minor (Age >= 65y) FINDINGS: CT HEAD BRAIN AND VENTRICLES: No acute intracranial hemorrhage. No mass effect or midline shift. No abnormal extra-axial fluid collection. No evidence of acute infarct. No hydrocephalus. ORBITS: No acute abnormality. SINUSES AND MASTOIDS: Chronic left sphenoid sinusitis with near-total opacification and surrounding osteitis. SOFT TISSUES AND SKULL: No acute skull fracture. No acute soft tissue abnormality. CT CERVICAL SPINE BONES AND ALIGNMENT: No acute fracture or traumatic malalignment. DEGENERATIVE CHANGES: Moderate multilevel degenerative disc disease including disc height loss and endplate spurring. Facet and uncovertebral hypertrophy contributes to varying  degrees of neural foraminal stenosis, greatest on the right at C5-C6. SOFT TISSUES: No prevertebral soft tissue swelling. IMPRESSION: 1. No acute intracranial abnormality. 2. No acute fracture or traumatic malalignment of the cervical spine. 3. Chronic left sphenoid sinusitis. Electronically signed by: Gilmore Molt 05/13/2024 06:28 PM EST RP Workstation: [id number]   CT Cervical Spine Wo Contrast Result Date: 05/13/2024 EXAM: CT HEAD AND CERVICAL SPINE 05/13/2024 06:12:53 PM TECHNIQUE: CT of the head and cervical spine was performed without the administration of intravenous contrast. Multiplanar reformatted images are provided for review. Automated exposure control,  iterative reconstruction, and/or weight based adjustment of the mA/kV was utilized to reduce the radiation dose to as low as reasonably achievable. COMPARISON: CT head and CT cervical spine 03/26/2024. CLINICAL HISTORY: Head trauma, minor (Age >= 65y) FINDINGS: CT HEAD BRAIN AND VENTRICLES: No acute intracranial hemorrhage. No mass effect or midline shift. No abnormal extra-axial fluid collection. No evidence of acute infarct. No hydrocephalus. ORBITS: No acute abnormality. SINUSES AND MASTOIDS: Chronic left sphenoid sinusitis with near-total opacification and surrounding osteitis. SOFT TISSUES AND SKULL: No acute skull fracture. No acute soft tissue abnormality. CT CERVICAL SPINE BONES AND ALIGNMENT: No acute fracture or traumatic malalignment. DEGENERATIVE CHANGES: Moderate multilevel degenerative disc disease including disc height loss and endplate spurring. Facet and uncovertebral hypertrophy contributes to varying degrees of neural foraminal stenosis, greatest on the right at C5-C6. SOFT TISSUES: No prevertebral soft tissue swelling. IMPRESSION: 1. No acute intracranial abnormality. 2. No acute fracture or traumatic malalignment of the cervical spine. 3. Chronic left sphenoid sinusitis. Electronically signed by: Gilmore Molt 05/13/2024 06:28 PM EST RP Workstation: [id number]       Time spent: ***    Laneta Blunt, DO [facility] 05/14/2024, 9:28 AM    Dictation software may have been used to generate the above note. Typos may occur and escape review in typed/dictated notes. Please contact Dr Blunt directly for Jenna if needed.  Staff may message me via secure chat in Epic  but this may not receive an immediate response,  please page me for urgent matters!  If 7PM-7AM, please contact night coverage [email or web address]       "

## 2024-05-14 NOTE — Assessment & Plan Note (Signed)
-   This is apparently in remission.

## 2024-05-14 NOTE — Plan of Care (Signed)

## 2024-05-14 NOTE — Progress Notes (Signed)
 [facility]   CHRONIC CARE MANAGEMENT OUTREACH ENCOUNTER           Date of Service:  05/14/2024      Service:  Care Coordination - phone  Is there someone else in the room? No.   Are you located in [place]? Yes  MyChart use by patient is active: no    Post-outreach Action Items:  Provider: N/A.  CM: follow up with need for new brace for back .  Patient: N/A.    Follow-up Next Call: N/A    Chronic Care Management (CCM) Outreach  Purpose of outreach: Arthritis, Chronic Pain, and Tobacco use   Care Manager (CM) completed the following:  Reviewed chart  [facility] admission- 12/25-      Call placed to Ms. Vallee re: noted pt is hospitalized with abd pain. It is not clear if pt was discharged or still hosptialized. CM called pt and left message checking in on her status and if she needs anything. The Internal Medicine Enhanced Care team was unable to reach Sharetha Hindes to follow-up with the patient in regards to the Chronic Care Management program. We have made one attempts to contact patient. A voicemail was left with a request to return our call.     P  Future Appointments   Date Time Provider Department Center   05/18/2024  9:30 AM Vien, Thersia Kurk, MD [facility]   05/24/2024 11:10 AM [facility] MAMMO RM 1 [facility] [place] - [facility]   06/01/2024 10:30 AM Burnis Nat HERO, FNP [facility]   06/14/2024  9:30 AM [facility] DEXA RM 1 [facility] [place] [facility]   06/30/2024 10:30 AM Serena Ronnald Norris, FNP [facility]   07/29/2024  1:30 PM Rod Grant Penning, PA [facility]   10/27/2024 10:40 AM Stacia Ozell Luck , MD [facility]       A copy of this Patient Outreach/CCM Encounter was sent to patient's Primary Care Provider     CCM Documentation  Time spent in direct care with patient and/or health care proxy via non-in-person encounter(s): 3  Time spent in indirect patient care and coordination: 15

## 2024-05-14 NOTE — Progress Notes (Signed)
I was the supervising physician in the delivery of the service. Markasia Carrol M Tomika Eckles, MD

## 2024-05-14 NOTE — Progress Notes (Signed)
 Bethany Meyer 's [name]  AUTOINJECTOR 162 mg/0.9 mL Pnij (tocilizumab -aazg) shipment will be canceled as a result of sufficient inventory of the drug.      I have reached out to the patient  at [phone] and left a voicemail message.  We will not reschedule the medication and have removed this/these medication(s) from the work request.  We have canceled this work request.

## 2024-05-15 LAB — CBC
HCT: 37.2 % (ref 36.0–46.0)
Hemoglobin: 12.4 g/dL (ref 12.0–15.0)
MCH: 30.8 pg (ref 26.0–34.0)
MCHC: 33.3 g/dL (ref 30.0–36.0)
MCV: 92.5 fL (ref 80.0–100.0)
Platelets: 198 K/uL (ref 150–400)
RBC: 4.02 MIL/uL (ref 3.87–5.11)
RDW: 12.6 % (ref 11.5–15.5)
WBC: 3.7 K/uL — ABNORMAL LOW (ref 4.0–10.5)
nRBC: 0 % (ref 0.0–0.2)

## 2024-05-15 LAB — BASIC METABOLIC PANEL WITH GFR
Anion gap: 8 (ref 5–15)
BUN: 12 mg/dL (ref 8–23)
CO2: 21 mmol/L — ABNORMAL LOW (ref 22–32)
Calcium: 8.2 mg/dL — ABNORMAL LOW (ref 8.9–10.3)
Chloride: 111 mmol/L (ref 98–111)
Creatinine, Ser: 1.22 mg/dL — ABNORMAL HIGH (ref 0.44–1.00)
GFR, Estimated: 48 mL/min — ABNORMAL LOW
Glucose, Bld: 85 mg/dL (ref 70–99)
Potassium: 3.3 mmol/L — ABNORMAL LOW (ref 3.5–5.1)
Sodium: 140 mmol/L (ref 135–145)

## 2024-05-15 MED ORDER — TRAMADOL HCL 50 MG PO TABS
50.0000 mg | ORAL_TABLET | Freq: Four times a day (QID) | ORAL | Status: DC | PRN
  Administered 2024-05-15: 50 mg via ORAL
  Filled 2024-05-15: qty 1

## 2024-05-15 MED ORDER — OXYCODONE HCL 5 MG PO TABS: 5.0000 mg | ORAL_TABLET | Freq: Three times a day (TID) | ORAL | Status: DC | PRN

## 2024-05-15 MED ORDER — BUPRENORPHINE 10 MCG/HR TD PTWK: 1.0000 | MEDICATED_PATCH | TRANSDERMAL | Status: DC

## 2024-05-15 MED ORDER — BUPRENORPHINE 10 MCG/HR TD PTWK: 1.0000 | MEDICATED_PATCH | TRANSDERMAL | Status: AC

## 2024-05-15 MED ORDER — HALOPERIDOL 0.5 MG PO TABS
0.5000 mg | ORAL_TABLET | Freq: Every day | ORAL | Status: DC
  Administered 2024-05-15: 0.5 mg via ORAL
  Filled 2024-05-15: qty 1

## 2024-05-15 MED ORDER — ADULT MULTIVITAMIN W/MINERALS CH: 1.0000 | ORAL_TABLET | Freq: Every day | ORAL | Status: AC

## 2024-05-15 MED ORDER — GABAPENTIN 300 MG PO CAPS
900.0000 mg | ORAL_CAPSULE | Freq: Three times a day (TID) | ORAL | Status: DC
  Administered 2024-05-15: 900 mg via ORAL
  Filled 2024-05-15 (×2): qty 3

## 2024-05-15 NOTE — Plan of Care (Signed)
" °  Problem: Education: Goal: Knowledge of General Education information will improve Description: Including pain rating scale, medication(s)/side effects and non-pharmacologic comfort measures Outcome: Progressing   Problem: Clinical Measurements: Goal: Ability to maintain clinical measurements within normal limits will improve Outcome: Progressing Goal: Will remain free from infection Outcome: Progressing Goal: Respiratory complications will improve Outcome: Progressing Goal: Cardiovascular complication will be avoided Outcome: Progressing   Problem: Activity: Goal: Risk for activity intolerance will decrease Outcome: Progressing   Problem: Coping: Goal: Level of anxiety will decrease Outcome: Progressing   Problem: Elimination: Goal: Will not experience complications related to bowel motility Outcome: Progressing Goal: Will not experience complications related to urinary retention Outcome: Progressing   Problem: Safety: Goal: Ability to remain free from injury will improve Outcome: Progressing   Problem: Skin Integrity: Goal: Risk for impaired skin integrity will decrease Outcome: Progressing   "

## 2024-05-15 NOTE — Plan of Care (Signed)

## 2024-05-15 NOTE — Discharge Summary (Signed)
 "    Physician Discharge Summary   Patient: Jenna Davis MRN: [id number]  DOB: [birth year]   Admit:     Date of Admission: 05/13/2024 Admitted from: home   Discharge: Date of discharge: 05/15/2024 Disposition: Home Condition at discharge: good  CODE STATUS: FULL CODE     Discharge Physician: Jenna Blunt, DO [facility]     PCP: Jenna Grant LABOR, Davis  Recommendations for Outpatient Follow-up:  Follow up with PCP Jenna Davis in 1-2 weeks PCP AND OTHER OUTPATIENT PROVIDERS: SEE BELOW FOR SPECIFIC DISCHARGE INSTRUCTIONS PRINTED FOR PATIENT IN ADDITION TO GENERIC AVS PATIENT INFO    Discharge Instructions     Diet general   Complete by: As directed    Discharge instructions   Complete by: As directed    Please note you are on several medications that will slow GI tract and cause nausea/vomiting and constipation. Would discuss with your pain management team / PCP about coming down on these medications or off altogether if possible:  Cyclobenzaprine  and methocarbamol  are both muscle relaxers that can cause nausea and drowsiness - would avoid these / discard any leftover pills you have at home.   High dose gabapentin  can cause nausea, please try to reduce this dose but do NOT stop cold turkey as this can cause seizure. Discuss coming down/off this medication.  Opiates and similar medications can cause constipation, nausea. Can continue current doses buprenorphine , would avoid tramadol  unless absolutely needed. Please discard any leftover oxycodone  pills you have at home.   If you use marijuana / any THC or CBD, this can cause nausea with frequent use, and can cause rebound nausea with withdrawal from these substances. Please do NOT use these AT ALL.   Increase activity slowly   Complete by: As directed          Discharge Diagnoses: Principal Problem:   Intractable nausea and vomiting Active Problems:   Rheumatoid arthritis (HCC)    Depression   Peripheral neuropathy   Chronic pain       Hospital Course: Hospital course / significant events:   HPI: Jenna Davis is a 68 y.o. female with medical history significant for paroxysmal atrial fibrillation, GERD, rheumatoid arthritis and depression, who presented to the emergency room with acute onset of intractable nausea and vomiting since yesterday with associated epigastric and periumbilical abdominal pain.  She admitted to low-grade fever of 100 with associated chills.  She has been having urinary urgency without frequency or dysuria or hematuria or flank pain.  She admits to mild cough occasionally productive of light green sputum without dyspnea or wheezing.  She continues to smoke 12 to 14 cigarettes/day,.  She denies any diarrhea or melena or bright red bleeding per rectum.  No bilious vomitus or hematemesis.  No bleeding   12/25: to ED. CT of the abdomen pelvis showed no acute abnormality. Labs suggestive of dehydration but repeat BMP shows improvement with IV fluids.  Likely starvation ketosis. 12/26: On EDP reassessment, new complaint RUQ pain, US  obtained no concerns. Patient immediately began having pain and nausea after drinking. She is tearful, tachycardic. Admitted to hospitalist for intractable N/V and abd pain. [name]  12/27: pt reports feeling better today on regular diet and eager for dc home - reviewed med instructions verbally and see written instructions as above.       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  Question acute gastritis/gastroenteritis  Question medication effect - opiates + gabapentin  + other CNS agents  Supportive care, antiemetics, IV fluids CLD advance as toelrated but she has not had po intake yet  PPI Pain management:  limit opiates Home dose  Buprenorphine  10 mcg/h patch weekly Home dose Tramadol  50 mg po q6h prn Stop/discard oxycodone  Reduce gabapentin  from 900 mg qid / 1200 mg tid --> to 900 mg tid - gabapentin  can also cause nausea at high doses PDMP reviewed tramadol  50 mg #120 x30d filled 11/30 = qid dosing at home Buprenorphine  10 mcg/h patch #4 x28d filled 11/26 Buprenorphine  5 mcg/h patch #4 x28d filled 11/22 Gabapentin  300 mg #360 x30d filled 11/06 = 12 caps/day = 900 mg qid or 1200 mg tid  Pt states she does not chronically use the oxycodone     Rheumatoid arthritis  Chronic pain Neuropathy Outpatient follow up  Pain control as above  Pt states she does not use the cyclobenzaprine  / methocarbamol , encouraged to discard these meds if any left over in the house    Depression Wellbutrin  XL  Seroquel. Haldol         Borderline underweight based on BMI: Body mass index is 20.5 kg/m.[name] Significantly low or high BMI is associated with higher medical risk.  Underweight - under 18  overweight - 25 to 29 obese - 30 or more Class 1 obesity: BMI of 30.0 to 34 Class 2 obesity: BMI of 35.0 to 39 Class 3 obesity: BMI of 40.0 to 49 Super Morbid Obesity: BMI 50-59 Super-super Morbid Obesity: BMI 60+ Healthy nutrition and physical activity advised as adjunct to other disease management and risk reduction treatments          Discharge Instructions  Allergies as of 05/15/2024   No Known Allergies      Medication List     STOP taking these medications    cyclobenzaprine  5 MG tablet Commonly known as: FLEXERIL    methocarbamol  500 MG tablet Commonly known as: ROBAXIN    oxyCODONE  5 MG immediate release tablet Commonly known as: Roxicodone        TAKE these medications    acetaminophen  325 MG tablet Commonly known as: TYLENOL  Take 2 tablets (650 mg total) by mouth every 6 (six) hours as needed for mild pain (pain score 1-3) (or Fever >/= 101).   alendronate 70 MG tablet Commonly known as:  FOSAMAX Take 70 mg by mouth once a week.   buprenorphine  10 MCG/HR Ptwk Commonly known as: BUTRANS  Place 1 patch onto the skin once a week. Start taking on: May 22, 2024   buPROPion  150 MG 24 hr tablet Commonly known as: WELLBUTRIN  XL Take 150 mg by mouth every morning.   D3 PO Take 1 capsule by mouth daily.   dicyclomine  20 MG tablet Commonly known as: BENTYL  Take 20 mg by mouth 4 (four) times daily as needed for other. Stomach cramps   folic acid  1 MG tablet Commonly known as: FOLVITE  Take 1 mg by mouth daily.   gabapentin  300 MG capsule Commonly known as: NEURONTIN  Take 900 mg by mouth 3 (three) times daily.   haloperidol  0.5 MG tablet Commonly known as: HALDOL  Take 0.5 mg by mouth every morning.   hydrOXYzine  10 MG tablet Commonly known as: ATARAX  Take 10 mg by mouth 3 (three) times daily as needed for itching.   lidocaine  5 % Commonly known as: LIDODERM  Place 2 patches onto the skin  daily. Remove & Discard patch within 12 hours or as directed by MD   multivitamin with minerals Tabs tablet Take 1 tablet by mouth daily. Start taking on: May 16, 2024   nicotine  21 mg/24hr patch Commonly known as: NICODERM CQ  - dosed in mg/24 hours Place 1 patch (21 mg total) onto the skin daily.   ondansetron  4 MG tablet Commonly known as: ZOFRAN  Take 1 tablet (4 mg total) by mouth every 6 (six) hours as needed for nausea.   Otrexup  25 MG/0.4ML Soaj Generic drug: Methotrexate  (PF) Inject 25 mg into the skin once a week.   polyethylene glycol 17 g packet Commonly known as: MIRALAX  / GLYCOLAX  Take 17 g by mouth 2 (two) times daily.   traMADol  50 MG tablet Commonly known as: ULTRAM  Take 1 tablet (50 mg total) by mouth every 8 (eight) hours as needed for moderate pain (pain score 4-6). What changed: Another medication with the same name was removed. Continue taking this medication, and follow the directions you see here.           Allergies[1]   Subjective: pt reports feeling better, passing flatus, no BM yet but she hasn't eaten in quite some time so she attributes lack of BM to that. She requests for dc home, feeling much better now and eager for discharge    Discharge Exam: BP 125/82 (BP Location: Left Arm)   Pulse 65   Temp 98.2 F (36.8 C) (Oral)   Resp 16   Ht 5' 6 (1.676 m)   Wt 57.6 kg   SpO2 100%   BMI 20.50 kg/m  General: Pt is alert, awake, not in acute distress Cardiovascular: RRR, S1/S2 +, no rubs, no gallops Respiratory: CTA bilaterally, no wheezing, no rhonchi Abdominal: Soft, NT, ND, bowel sounds + Extremities: no edema, no cyanosis     The results of significant diagnostics from this hospitalization (including imaging, microbiology, ancillary and laboratory) are listed below for reference.     Microbiology: Recent Results (from the past 240 hours)  Urine Culture     Status: Abnormal (Preliminary result)   Collection Time: 05/13/24 11:34 PM   Specimen: Urine, Clean Catch  Result Value Ref Range Status   Specimen Description   Final    URINE, CLEAN CATCH Performed at [facility], [address], [place], [place] [address]    Special Requests   Final    NONE Performed at [facility], [address], [place], [place] [address]    Culture (A)  Final    >=100,000 COLONIES/mL ESCHERICHIA COLI SUSCEPTIBILITIES TO FOLLOW Performed at [facility], [address], [place], [place] [address]    Report Status PENDING  Incomplete     Labs: BNP (last 3 results) No results for input(s): BNP in the last 8760 hours. Basic Metabolic Panel: Recent Labs  Lab 05/13/24 1430 05/13/24 2034 05/14/24 0558 05/15/24 0524  NA 136 136 137 140  K 3.9 3.5 3.4* 3.3*  CL 101 102 108 111  CO2 17* 19* 17* 21*  GLUCOSE 104* 225* 103* 85  BUN 29* 26* 22 12  CREATININE 1.49* 1.41* 1.22* 1.22*  CALCIUM 9.6 8.6* 8.2* 8.2*   Liver Function Tests: Recent  Labs  Lab 05/13/24 1430  AST 96*  ALT 40  ALKPHOS 110  BILITOT 0.5  PROT 9.1*  ALBUMIN  5.1*   Recent Labs  Lab 05/13/24 1430  LIPASE 47   No results for input(s): AMMONIA in the last 168 hours.  CBC: Recent Labs  Lab 05/13/24 1540 05/14/24 0558 05/15/24 0524  WBC 5.7 5.3 3.7*  HGB 16.1* 13.4 12.4  HCT 47.7* 40.0 37.2  MCV 92.4 93.9 92.5  PLT 217 196 198   Cardiac Enzymes: No results for input(s): CKTOTAL, CKMB, CKMBINDEX, TROPONINI in the last 168 hours. BNP: Invalid input(s): POCBNP CBG: No results for input(s): GLUCAP in the last 168 hours. D-Dimer No results for input(s): DDIMER in the last 72 hours. Hgb A1c No results for input(s): HGBA1C in the last 72 hours. Lipid Profile No results for input(s): CHOL, HDL, LDLCALC, TRIG, CHOLHDL, LDLDIRECT in the last 72 hours. Thyroid function studies No results for input(s): TSH, T4TOTAL, T3FREE, THYROIDAB in the last 72 hours.  Invalid input(s): FREET3 Anemia work up No results for input(s): VITAMINB12, FOLATE, FERRITIN, TIBC, IRON, RETICCTPCT in the last 72 hours. Urinalysis    Component Value Date/Time   COLORURINE STRAW (A) 05/13/2024 2034   APPEARANCEUR CLEAR (A) 05/13/2024 2034   LABSPEC 1.018 05/13/2024 2034   PHURINE 6.0 05/13/2024 2034   GLUCOSEU 150 (A) 05/13/2024 2034   HGBUR SMALL (A) 05/13/2024 2034   BILIRUBINUR NEGATIVE 05/13/2024 2034   KETONESUR NEGATIVE 05/13/2024 2034   PROTEINUR NEGATIVE 05/13/2024 2034   NITRITE POSITIVE (A) 05/13/2024 2034   LEUKOCYTESUR NEGATIVE 05/13/2024 2034   Sepsis Labs Recent Labs  Lab 05/13/24 1540 05/14/24 0558 05/15/24 0524  WBC 5.7 5.3 3.7*   Microbiology Recent Results (from the past 240 hours)  Urine Culture     Status: Abnormal (Preliminary result)   Collection Time: 05/13/24 11:34 PM   Specimen: Urine, Clean Catch  Result Value Ref Range Status   Specimen Description   Final    URINE, CLEAN  CATCH Performed at [facility], [address] [place], [place] [address]    Special Requests   Final    NONE Performed at [facility], [address] [place], [place] [address]    Culture (A)  Final    >=100,000 COLONIES/mL ESCHERICHIA COLI SUSCEPTIBILITIES TO FOLLOW Performed at [facility], [address] [place], [place] [address]    Report Status PENDING  Incomplete   Imaging US  ABDOMEN LIMITED RUQ (LIVER/GB) Result Date: 05/14/2024 CLINICAL DATA:  Right-sided abdominal pain. EXAM: ULTRASOUND ABDOMEN LIMITED RIGHT UPPER QUADRANT COMPARISON:  CT yesterday FINDINGS: Gallbladder: The gallbladder is prominently distended spanning 14 cm in length. No gallstones or wall thickening visualized. Trace pericholecystic fluid. No sonographic Murphy sign noted by sonographer. Common bile duct: Diameter: 5 mm, normal. Liver: No focal lesion identified. Within normal limits in parenchymal echogenicity. Portal vein is patent on color Doppler imaging with normal direction of blood flow towards the liver. Other: None. IMPRESSION: 1. Distended gallbladder but no gallstones or gallbladder wall thickening. Gallbladder distension has been present on prior exams. 2. Trace pericholecystic fluid. 3. No biliary dilatation. Normal sonographic appearance of the liver. Electronically Signed   By: Andrea Gasman M.D.   On: 05/14/2024 01:45   CT ABDOMEN PELVIS W CONTRAST Result Date: 05/13/2024 EXAM: CT ABDOMEN AND PELVIS WITH CONTRAST 05/13/2024 06:12:53 PM TECHNIQUE: CT of the abdomen and pelvis was performed with the administration of 80 mL iohexol  (OMNIPAQUE ) 300 MG/ML solution. Multiplanar reformatted images are provided for review. Automated exposure control, iterative reconstruction, and/or weight-based adjustment of the mA/kV was utilized to reduce the radiation dose to as low as reasonably achievable. COMPARISON: 02/15/2024 CLINICAL HISTORY: Abdominal pain, acute, nonlocalized.  FINDINGS: LOWER CHEST: Moderate sized sliding type hiatal hernia.  LIVER: Focal fatty infiltration along the falciform ligament. GALLBLADDER AND BILE DUCTS: Unchanged, distended and fluid filled gallbladder without radiopaque stones or wall thickening. No biliary ductal dilatation. SPLEEN: No acute abnormality. PANCREAS: No acute abnormality. ADRENAL GLANDS: No acute abnormality. KIDNEYS, URETERS AND BLADDER: No stones in the kidneys or ureters. No hydronephrosis. No perinephric or periureteral stranding. Urinary bladder is unremarkable. GI AND BOWEL: Stomach demonstrates no acute abnormality. There is no bowel obstruction. PERITONEUM AND RETROPERITONEUM: No ascites. No free air. VASCULATURE: Right coronary artery atherosclerosis. Atherosclerosis throughout the aorta and iliac arteries. LYMPH NODES: No lymphadenopathy. REPRODUCTIVE ORGANS: No acute abnormality. BONES AND SOFT TISSUES: Diffuse osteopenia. Lumbar fusion hardware at L4-L5 again noted with intervertebral disc spacer noted. Moderate multilevel degenerative disc disease of the thoracolumbar spine. No focal soft tissue abnormality. IMPRESSION: 1. No acute findings in the abdomen or pelvis. 2. The unchanged gallbladder is distended and fluid-filled. No radiopaque gallstones or wall thickening. 3. Moderate-sized, sliding-type hiatal hernia again noted. Electronically signed by: Rogelia Myers MD 05/13/2024 06:33 PM EST RP Workstation: [id number]   CT Head Wo Contrast Result Date: 05/13/2024 EXAM: CT HEAD AND CERVICAL SPINE 05/13/2024 06:12:53 PM TECHNIQUE: CT of the head and cervical spine was performed without the administration of intravenous contrast. Multiplanar reformatted images are provided for review. Automated exposure control, iterative reconstruction, and/or weight based adjustment of the mA/kV was utilized to reduce the radiation dose to as low as reasonably achievable. COMPARISON: CT head and CT cervical spine 03/26/2024. CLINICAL HISTORY:  Head trauma, minor (Age >= 65y) FINDINGS: CT HEAD BRAIN AND VENTRICLES: No acute intracranial hemorrhage. No mass effect or midline shift. No abnormal extra-axial fluid collection. No evidence of acute infarct. No hydrocephalus. ORBITS: No acute abnormality. SINUSES AND MASTOIDS: Chronic left sphenoid sinusitis with near-total opacification and surrounding osteitis. SOFT TISSUES AND SKULL: No acute skull fracture. No acute soft tissue abnormality. CT CERVICAL SPINE BONES AND ALIGNMENT: No acute fracture or traumatic malalignment. DEGENERATIVE CHANGES: Moderate multilevel degenerative disc disease including disc height loss and endplate spurring. Facet and uncovertebral hypertrophy contributes to varying degrees of neural foraminal stenosis, greatest on the right at C5-C6. SOFT TISSUES: No prevertebral soft tissue swelling. IMPRESSION: 1. No acute intracranial abnormality. 2. No acute fracture or traumatic malalignment of the cervical spine. 3. Chronic left sphenoid sinusitis. Electronically signed by: Gilmore Molt 05/13/2024 06:28 PM EST RP Workstation: [id number]   CT Cervical Spine Wo Contrast Result Date: 05/13/2024 EXAM: CT HEAD AND CERVICAL SPINE 05/13/2024 06:12:53 PM TECHNIQUE: CT of the head and cervical spine was performed without the administration of intravenous contrast. Multiplanar reformatted images are provided for review. Automated exposure control, iterative reconstruction, and/or weight based adjustment of the mA/kV was utilized to reduce the radiation dose to as low as reasonably achievable. COMPARISON: CT head and CT cervical spine 03/26/2024. CLINICAL HISTORY: Head trauma, minor (Age >= 65y) FINDINGS: CT HEAD BRAIN AND VENTRICLES: No acute intracranial hemorrhage. No mass effect or midline shift. No abnormal extra-axial fluid collection. No evidence of acute infarct. No hydrocephalus. ORBITS: No acute abnormality. SINUSES AND MASTOIDS: Chronic left sphenoid sinusitis with near-total  opacification and surrounding osteitis. SOFT TISSUES AND SKULL: No acute skull fracture. No acute soft tissue abnormality. CT CERVICAL SPINE BONES AND ALIGNMENT: No acute fracture or traumatic malalignment. DEGENERATIVE CHANGES: Moderate multilevel degenerative disc disease including disc height loss and endplate spurring. Facet and uncovertebral hypertrophy contributes to varying degrees of neural foraminal stenosis, greatest on the right at C5-C6. SOFT TISSUES: No prevertebral soft tissue swelling.  IMPRESSION: 1. No acute intracranial abnormality. 2. No acute fracture or traumatic malalignment of the cervical spine. 3. Chronic left sphenoid sinusitis. Electronically signed by: Gilmore Molt 05/13/2024 06:28 PM EST RP Workstation: [id number]      Time coordinating discharge: over 30 minutes  SIGNED:  Priscilla Kirstein DO [facility]       [1] No Known Allergies  "

## 2024-05-16 LAB — URINE CULTURE: Culture: >=100000 — AB

## 2024-05-18 DIAGNOSIS — G8929 Other chronic pain: Principal | ICD-10-CM

## 2024-05-18 DIAGNOSIS — M25511 Pain in right shoulder: Principal | ICD-10-CM

## 2024-05-18 MED ORDER — TRAMADOL 50 MG TABLET
ORAL_TABLET | Freq: Four times a day (QID) | ORAL | 0 refills | 30.00000 days | Status: CP | PRN
Start: 2024-05-18 — End: 2024-06-17

## 2024-05-18 NOTE — Patient Instructions (Signed)
 Continue to taper gabapentin  by taking 600mg  (2 tablets) in the morning, 900mg  (3 tablets) in the afternoon and 900mg  (3 tablets)before bed for one week,  Then take 600mg  (2 tablets) in the morning, 600mg  (2 tablets) in the afternoon and 900mg  (3 tablets) before bed for one week,  Then take 600mg  (2 tablets) three times a day thereafter until I follow up with you.    Continue tramadol  50mg  PRN and Butrans  patch 10mcg/hr weekly.
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
## 2024-05-18 NOTE — Progress Notes (Signed)
 [facility]     Reason for  telephone visit: Follow up pain shoulder, abdomen, wrist. Patient unable to do video because of the location, surrounded with trees.         Dexcom or Libre CGM in use? If so, pull appropriate reporting through portal (Dexcom) or EPIC order [name]).    HCDM reviewed and updated in Epic:    We are working to make sure all of our patients??? wishes are updated in Epic and part of that is documenting a Environmental Health Practitioner for each patient  A Health Care Decision Maker is someone you choose who can make health care decisions for you if you are not able - who would you most want to do this for you????  is already up to date.    HCDM (patient stated preference): Bethany Meyer,Bethany Meyer - Daughter - [phone]      __________________________________________________________________________________________    SCREENINGS COMPLETED IN FLOWSHEETS      AUDIT       PHQ2       PHQ9          GAD7       COPD Assessment       Falls Risk

## 2024-05-24 ENCOUNTER — Ambulatory Visit: Admit: 2024-05-24 | Payer: Medicare (Managed Care)

## 2024-05-27 NOTE — Progress Notes (Signed)
 This pharmacist was notified by a technician that this patient has reported that they've missed 2 doses of their [name] . I have reviewed the patient's medical record and have determined that no further pharmacist action is needed.      Approximate time spent: 0-5 minutes    Velma Ober, PharmD, Clinical Specialty Pharmacist  [facility]

## 2024-05-27 NOTE — Progress Notes (Signed)
 [facility] Refill Coordination Note    Specialty Medication(s) to be Shipped:   Inflammatory Disorders: [name]  AUTOINJECTOR 162 mg/0.9 mL Pnij (tocilizumab -aazg)    Other medication(s) to be shipped: No additional medications requested for fill at this time    Specialty Medications not needed at this time: N/A     Bethany Meyer, DOB: [birth year]  Phone: There are no phone numbers on file.      All above HIPAA information was verified with patient.     Was a nurse, learning disability used for this call? No    Completed refill call assessment today to schedule patient's medication shipment from the [facility]  [phone]).  All relevant notes have been reviewed.     Specialty medication(s) and dose(s) confirmed: Regimen is correct and unchanged.   Changes to medications: Bethany Meyer reports no changes at this time.  Changes to insurance: No  New side effects reported not previously addressed with a pharmacist or physician: Yes - Patient reports Swollen in feet. Patient would not like to speak to the pharmacist today. Their provider is aware.  Questions for the pharmacist: No    Confirmed patient received a Conservation Officer, Historic Buildings and a Surveyor, Mining with first shipment. The patient will receive a drug information handout for each medication shipped and additional FDA Medication Guides as required.       DISEASE/MEDICATION-SPECIFIC INFORMATION        N/A    SPECIALTY MEDICATION ADHERENCE     Medication Adherence    Patient reported X missed doses in the last month: 2  Specialty Medication: tocilizumab -aazg: [name]  AUTOINJECTOR 162 mg/0.9 mL Pnij  Patient is on additional specialty medications: No              Were doses missed due to medication being on hold? No     [name]  AUTOINJECTOR 162 mg/0.9 mL Pnij (tocilizumab -aazg): 0 doses of medicine on hand       Specialty medication is an injection or given on a cycle: Yes, Next injection is scheduled for 05/28/2024.    REFERRAL TO PHARMACIST     Referral to the pharmacist: Yes - routine compliance concerns. Patient has missed 1-3 doses of medication. Refills were scheduled and concern routed to pharmacist for evaluation.      SHIPPING     Shipping address confirmed in Epic.     Cost and Payment: Patient has a copay of $5.10. They are aware and have authorized the pharmacy to charge the credit card on file.    Delivery Scheduled: Yes, Expected medication delivery date: 05/28/2024.     Medication will be delivered via Next Day Courier to the prescription address in Epic WAM.    Bethany Meyer   [facility]  Specialty Technician

## 2024-05-28 NOTE — Progress Notes (Signed)
 Bethany Meyer 's [name]  shipment will be delayed as a result of credit card ending in [phone] declined     I have reached out to the patient  via MyChart message and communicated the delay. We will wait for a call back from the patient to reschedule the delivery.  We have not confirmed the new delivery date.

## 2024-06-01 ENCOUNTER — Ambulatory Visit: Admit: 2024-06-01 | Discharge: 2024-06-01 | Payer: Medicare (Managed Care)

## 2024-06-01 ENCOUNTER — Inpatient Hospital Stay: Admit: 2024-06-01 | Discharge: 2024-06-01 | Payer: Medicare (Managed Care)

## 2024-06-01 DIAGNOSIS — M25511 Pain in right shoulder: Principal | ICD-10-CM

## 2024-06-01 MED ADMIN — triamcinolone acetonide (KENALOG-40) injection 40 mg: 40 mg | INTRA_ARTICULAR | @ 16:00:00 | Stop: 2024-06-01
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
## 2024-06-01 NOTE — Telephone Encounter (Signed)
 Duplicate encounter made in error

## 2024-06-01 NOTE — Patient Instructions (Addendum)
 Bethany Meyer has ordered an EMG test for you. Please give them a call at [phone] to schedule. They will not call you to schedule. If you would like an external referral to another location please reach out to us  via MyChart or by leaving a voicemail at [phone] and let us  know the location you would like to have your EMG done at.      It will take 7-14 days for EMG results to return. We will contact you to arrange an in clinic visit after we receive EMG results.   You were seen today for follow-up of your left distal radius fracture  -No activity restrictions  -I have placed an order for EMG/nerve conduction study.  I will call you to discuss the results  -Continue working on wrist range of motion  -Consider wearing Velcro wrist brace at night for the next 6-8 weeks to take pressure off the median nerve        Thank you for choosing [facility]!  We appreciate the opportunity to participate in your care.      If questions or concerns arise after your visit, you may contact us  by [facility] or by calling [phone].  ?  MyChart messages: These messages can be sent to your provider and will be checked by their clinical support staff.? The messages are checked throughout the day during normal business hours from 8:30 am-4:00 pm Monday-Friday, however responses may take up to 48 hours.? Please use this method of communication for non-urgent and non-emergent concerns, questions, refill requests or inquiries only.? ?Our team will help respond to all of your questions.? Please note that you may be asked to see a provider by either a telehealth or in person visit if it is deemed your questions are best handled in the clinic setting in person.??    If you have an issue that requires emergent attention that cannot wait, consider coming to our [facility] walk-in clinic, or go to the nearest Emergency Department.     If you need to schedule future appointments, please call [phone].      We look forward to seeing you again in the future and appreciate you choosing [place] for your care!     Thank you,  Bethany Marcus, NP
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
## 2024-06-01 NOTE — Telephone Encounter (Addendum)
 Attempted to call pt in order to reschedule her upcoming telephone visit with Dr. Vien due to scheduling error. No answer and line stated we are unable to direct your call. Called pt's daughter to confirm phone number on file. Daughter confirmed number. Daughter stated to also try [phone]. Tried this line too which rang continuously. Daughter stated she would notify pt to contact clinic if she reached her.

## 2024-06-03 MED FILL — TYENNE AUTOINJECTOR 162 MG/0.9 ML SUBCUTANEOUS PEN INJECTOR: SUBCUTANEOUS | 28 days supply | Qty: 3.6 | Fill #2

## 2024-06-03 NOTE — Progress Notes (Signed)
 Bethany Meyer 's Bethany Meyer  shipment will be rescheduled as a result of credit card on file and copay collected.     I have reached out to the patient  at [phone] and communicated the delivery change. We will reschedule the medication for the delivery date that the patient agreed upon.  We have confirmed the delivery date as 06/04/24

## 2024-06-07 NOTE — Progress Notes (Signed)
 I saw and evaluated the patient, participating in the key elements the service. I discussed the findings, assessment and plan with Resident and agree with the Resident's findings and plan as documented the Resident's note. I was present for the entirety of the procedure(s).     Risks and benefits were discussed.  Timeout was performed.  A mixture of 4 cc of ropivacaine and 40 mg of Kenalog  was injected into the Right shoulderusing aseptic tecnique.  The patient tolerated this well.

## 2024-06-09 DIAGNOSIS — F17200 Nicotine dependence, unspecified, uncomplicated: Principal | ICD-10-CM

## 2024-06-09 DIAGNOSIS — M1990 Unspecified osteoarthritis, unspecified site: Principal | ICD-10-CM

## 2024-06-09 NOTE — Progress Notes (Signed)
I was the supervising physician in the delivery of the service. Glen Blatchley A Kenasia Scheller, PA
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
## 2024-06-16 DIAGNOSIS — M069 Rheumatoid arthritis, unspecified: Secondary | ICD-10-CM

## 2024-06-16 DIAGNOSIS — F32A Depression, unspecified depression type: Secondary | ICD-10-CM

## 2024-06-16 DIAGNOSIS — M5431 Sciatica, right side: Secondary | ICD-10-CM

## 2024-06-16 DIAGNOSIS — R1084 Generalized abdominal pain: Principal | ICD-10-CM

## 2024-06-16 DIAGNOSIS — K219 Gastro-esophageal reflux disease without esophagitis: Secondary | ICD-10-CM

## 2024-06-16 MED ORDER — FOLIC ACID 1 MG TABLET
ORAL_TABLET | Freq: Every day | ORAL | 3 refills | 90.00000 days | Status: CP
Start: 2024-06-16 — End: ?

## 2024-06-16 MED ORDER — NALOXONE 4 MG/ACTUATION NASAL SPRAY
0 refills | 0.00000 days
Start: 2024-06-16 — End: ?

## 2024-06-16 MED ORDER — OMEPRAZOLE 20 MG CAPSULE,DELAYED RELEASE
ORAL_CAPSULE | Freq: Every day | ORAL | 3 refills | 100.00000 days | Status: CP
Start: 2024-06-16 — End: 2025-07-21

## 2024-06-16 MED ORDER — POLYETHYLENE GLYCOL 3350 17 GRAM/DOSE ORAL POWDER
Freq: Every day | ORAL | 2 refills | 100.00000 days
Start: 2024-06-16 — End: ?

## 2024-06-16 MED ORDER — BUPROPION HCL XL 150 MG 24 HR TABLET, EXTENDED RELEASE
ORAL_TABLET | Freq: Every morning | ORAL | 0 refills | 90.00000 days | Status: CP
Start: 2024-06-16 — End: ?

## 2024-06-16 MED ORDER — DICYCLOMINE 20 MG TABLET
ORAL_TABLET | Freq: Four times a day (QID) | ORAL | 3 refills | 90.00000 days | PRN
Start: 2024-06-16 — End: 2025-06-16

## 2024-06-16 MED ORDER — ONDANSETRON HCL 4 MG TABLET
Freq: Four times a day (QID) | ORAL | 0.00000 days | PRN
Start: 2024-06-16 — End: ?

## 2024-06-16 MED ORDER — TYENNE AUTOINJECTOR 162 MG/0.9 ML SUBCUTANEOUS PEN INJECTOR
SUBCUTANEOUS | 3 refills | 84.00000 days
Start: 2024-06-16 — End: ?

## 2024-06-16 MED ORDER — GABAPENTIN 300 MG CAPSULE
ORAL_CAPSULE | Freq: Three times a day (TID) | ORAL | 2 refills | 40.00000 days
Start: 2024-06-16 — End: ?

## 2024-06-16 MED ORDER — QUETIAPINE 25 MG TABLET
ORAL_TABLET | 1 refills | 0.00000 days
Start: 2024-06-16 — End: ?

## 2024-06-16 MED ORDER — SYMBICORT 80 MCG-4.5 MCG/ACTUATION HFA AEROSOL INHALER
0.00000 days
Start: 2024-06-16 — End: ?

## 2024-06-16 MED ORDER — TRAMADOL 50 MG TABLET
ORAL_TABLET | Freq: Four times a day (QID) | ORAL | 0 refills | 30.00000 days | PRN
Start: 2024-06-16 — End: 2024-07-16

## 2024-06-16 MED ORDER — HYDROXYZINE HCL 10 MG TABLET
ORAL_TABLET | Freq: Four times a day (QID) | ORAL | 1 refills | 68.00000 days | Status: CP | PRN
Start: 2024-06-16 — End: 2025-05-20

## 2024-06-18 MED ORDER — NALOXONE 4 MG/ACTUATION NASAL SPRAY
NASAL | 0 refills | 0.00000 days | Status: CP
Start: 2024-06-18 — End: 2024-06-18

## 2024-06-18 MED ORDER — QUETIAPINE 25 MG TABLET
ORAL_TABLET | ORAL | 1 refills | 0.00000 days | Status: CP
Start: 2024-06-18 — End: 2024-06-18

## 2024-06-18 MED ORDER — DICYCLOMINE 20 MG TABLET
ORAL_TABLET | Freq: Four times a day (QID) | ORAL | 3 refills | 90.00000 days | Status: CP | PRN
Start: 2024-06-18 — End: 2025-06-18

## 2024-06-18 MED ORDER — GABAPENTIN 300 MG CAPSULE
ORAL_CAPSULE | Freq: Three times a day (TID) | ORAL | 2 refills | 40.00000 days | Status: CP
Start: 2024-06-18 — End: 2024-06-18

## 2024-06-18 MED ORDER — ONDANSETRON HCL 4 MG TABLET
ORAL_TABLET | Freq: Four times a day (QID) | ORAL | 2 refills | 8.00000 days | Status: CP | PRN
Start: 2024-06-18 — End: 2024-06-18

## 2024-06-18 MED ORDER — SYMBICORT 80 MCG-4.5 MCG/ACTUATION HFA AEROSOL INHALER
Freq: Two times a day (BID) | RESPIRATORY_TRACT | 11 refills | 31.00000 days | Status: CP
Start: 2024-06-18 — End: 2024-06-18

## 2024-06-18 MED ORDER — POLYETHYLENE GLYCOL 3350 17 GRAM/DOSE ORAL POWDER
Freq: Every day | ORAL | 2 refills | 100.00000 days | Status: CP
Start: 2024-06-18 — End: 2024-06-18

## 2024-06-18 MED ORDER — TRAMADOL 50 MG TABLET
ORAL_TABLET | Freq: Four times a day (QID) | ORAL | 0 refills | 30.00000 days | Status: CP | PRN
Start: 2024-06-18 — End: 2024-07-18

## 2024-06-18 NOTE — Addendum Note (Signed)
 Addended by: GEORGINA SILVANO BROCKS on: 06/18/2024 12:42 PM     Modules accepted: Orders

## 2024-06-18 NOTE — Telephone Encounter (Signed)
 Refill request

## 2024-06-23 NOTE — Progress Notes (Signed)
I was the supervising physician in the delivery of the service. Glen Blatchley A Kenasia Scheller, PA

## 2024-06-23 NOTE — Progress Notes (Unsigned)
 Rheumatology Clinic Follow-up Note     Assessment/Plan:    Bethany Meyer is a 69 y.o.  female with a PMHx of seropositive RA, tobacco use, spinal stenosis who presents today for follow-up of RA.    ***  Assessment & Plan  Rheumatoid arthritis with active disease  Not controlled. Bethany Meyer  approved to by insurance. Discussed Bethany Meyer  side effects, including rare bowel perforation risk.  - Start Bethany Meyer  weekly injections. Provided with pharmacy's number to schedule.   - Update monitoring labs today; CBCw/diff, Cr, AST, ALT  - Administer Depo 40mg  IM for current symptoms.  - Hold off on methotrexate  due to recent nausea  - Encourage smoking cessation.    Right shoulder dislocation, status post reduction  Right shoulder dislocation reduced surgically. Using sling with movement restrictions for recovery.  - Follow up with orthopedics.  - Continue sling use and shoulder elevation.    Osteoporosis  Osteoporosis previously treated with Fosamax  but didn't take consistently. Not currently on treatment.  - Order bone density scan.  - Consider reclast infusion if indicated.    Tobacco use disorder  Continues smoking half a pack daily, exacerbating rheumatoid arthritis.  - Encourage smoking cessation.      F/U as scheduled in June with Dr. Freada    I personally spent *** minutes face-to-face and non-face-to-face in the care of this patient, which includes all pre, intra, and post visit time on the date of service.  All documented time was specific to the E/M visit and does not include any procedures that may have been performed.    {Documented time must be specific to the E/M visit. Billable E/M time excludes pre/intra/post procedural work and any performed procedures. If using a time statement, detail how the separate time was spent, e.g., discussing prescription drug management, PT/OT, etc. Independent time should be discreet, non-overlapping minutes with other providers (APP/residents) involved in the patient's care. [facility]):[id number]}          Primary Care Provider: Rod Grant Penning, PA    HPI:  Bethany Meyer is a 69 y.o.  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    Last Visit: 02/2024 with myself    Hospitalized 02/15/24-02/19/24 due to pyelonephritis, also complicated by R glenohumeral dislocation s/p closed reduction 10/1    Today:    Bethany Meyer ?    Joints?  Swelling?  AM stiffness?    Pt score?    Fever?  Illness?  Infections?    Nausea?    Smoking?      As background  Bethany Meyer's joint pain first started around May/June 2016 with right knee pain and swelling, then right foot and ankle swelling. She is RF negative, CCP positive. She was started on methotrexate  but had persistent joint pain and swelling so was started on Humira in March 2017. She is on methotrexate  20mg  weekly po (have been unable to get SQ approved by insurance).  She was increased to weekly Humira but continued to have joint pain and swelling.  Enbrel  was not approved by insurance so prescription was sent for Xeljanz  instead in 2018. Xeljanz  was initially effective but the patient experienced several flares while on this medication. This was discontinued in May 2019 and she was started on Cimzia  in June 2019. Cimzia  was discontinued in summer 2020 due to lack of perceived benefit from medication. She was on Rituximab  with first infusion 06/2019, she has  had sporadic dosing schedule due to issues with transportation and following up.     Ritux 1g 06/2019, 12/30/19, 01/13/20, 12/20/20, 04/30/22. Due to multiple missed infusion appointments, we switched her to Actemra  03/2023. Rituximab  was effective.    Review of Systems:  Positive findings noted above, otherwise a 14 point review of systems was reviewed and negative    Past Medical, Surgical, Family and Social History reviewed and updated per EMR     Allergies:  Patient has no known allergies.    Medications:   Current Medications[1]      Objective   There were no vitals filed for this visit.      Physical Exam***  General: well appearing, no acute distress  Eyes: EOMI, normal conjunctivae   ENT: MMM.  Oropharynx without any erythema or exudate.  No oral or nasal ulcers.  Neck: supple. No cervical lymphadenopathy  Cardiovascular: Regular rate and rhythm. Nl S1 and S2.  Pulmonary: Nl WOB on RA, CTAB.  Skin: no rash, lesions, breakdown. No purpura or petechiae. No digital ulcers.   Extremities: Warm and well perfused, no cyanosis, clubbing or edema  Musculoskeletal: Tenderness to b/l 2-3 MCPs and thumbs. No overt swelling. Able to make tight fist b/l. Tender to L wrist w/o swelling. Good ROM of b/l elbow. Good ROM of b/l shoulders.    Neurologic: Cranial nerves grossly intact, strength 5/5 throughout.  Normal sensation  Psychiatric: Normal mood and affect.                   CDAI interpretation:  0.0-2.8 Remission   2.9-10.0 Low disease activity   10.1-22.0 Moderate disease activity   22.1-76.0 High disease activity         WBC  4.0 - 10.5 K/uL 6.2    RBC  3.87 - 5.11 MIL/uL 3.80 Low     Hemoglobin  12.0 - 15.0 g/dL 87.9    HCT  63.9 - 53.9 % 37.0    MCV  80.0 - 100.0 fL 97.4    MCH  26.0 - 34.0 pg 31.6    MCHC  30.0 - 36.0 g/dL 67.5    RDW  88.4 - 84.4 % 14.6    Platelets  150 - 400 K/uL 385    nRBC  0.0 - 0.2 % 0.0 Performed at [facility], [address] [place], [place] [address]   Resulting Agency [facility]    Specimen Collected: 02/19/24 03:14    Performed by: [facility] Last Resulted: 02/19/24 03:44   Received From: [facility]  Result Received: 02/25/24 11:00          [1]   Current Outpatient Medications:     acetaminophen  (TYLENOL ) 325 MG tablet, Take 1 tablet (325 mg total) by mouth two (2) times a day as needed for pain., Disp: , Rfl:     alendronate  (FOSAMAX ) 70 MG tablet, Take 1 tablet (70 mg total) by mouth every seven (7) days. (Patient not taking: Reported on 05/18/2024), Disp: 12 tablet, Rfl: 3    arm brace Misc, Boxer splint, metacarpal wrist brace, Disp: 1 each, Rfl: 0    aspirin  81 MG chewable tablet, Chew 1 tablet (81 mg total) before bedtime. (Patient not taking: Reported on 05/18/2024), Disp: 36 tablet, Rfl: 3    buprenorphine  10 mcg/hour PTWK transdermal patch, Place 1 patch on the skin every seven (7) days., Disp: 4 patch, Rfl: 5    buPROPion  (WELLBUTRIN  XL) 150 MG 24 hr  tablet, Take 1 tablet (150 mg total) by mouth every morning., Disp: 90 tablet, Rfl: 0    dicyclomine  (BENTYL ) 20 mg tablet, Take 1 tablet (20 mg total) by mouth four times a day as needed (stomach cramps)., Disp: 360 tablet, Rfl: 3    empty container Misc, Use as directed, Disp: 1 each, Rfl: 2    folic acid  (FOLVITE ) 1 MG tablet, Take 1 tablet (1,000 mcg total) by mouth daily., Disp: 90 tablet, Rfl: 3    gabapentin  (NEURONTIN ) 300 MG capsule, Take 3 capsules (900 mg total) by mouth Three (3) times a day. 4 CAPSULES THREE TIMES DAILY. INSURANCE WILL ONLY COVER 9 CAPSULES PER DAY, Disp: 360 capsule, Rfl: 2    hydrOXYzine  (ATARAX ) 10 MG tablet, Take 1 tablet (10 mg total) by mouth every six (6) hours as needed for itching., Disp: 270 tablet, Rfl: 1    leg brace (KNEE SUPPORT BRACE) Misc, 1 each by Miscellaneous route daily as needed., Disp: 1 each, Rfl: 0    naloxone  (NARCAN ) 4 mg nasal spray, One spray in either nostril once for known/suspected opioid overdose. May repeat every 2-3 minutes in alternating nostril til EMS arrives, Disp: 2 each, Rfl: 0    needle, disp, 25 gauge 25 gauge x 5/8 Ndle, For methotrexate  injections, Disp: 100 each, Rfl: 0    omeprazole  (PRILOSEC) 20 MG capsule, Take 1 capsule (20 mg total) by mouth daily., Disp: 100 capsule, Rfl: 3    ondansetron  (ZOFRAN ) 4 MG tablet, Take 1 tablet (4 mg total) by mouth every six (6) hours as needed for nausea., Disp: 30 tablet, Rfl: 2    polyethylene glycol (GLYCOLAX ) 17 gram/dose powder, Take 17 g by mouth daily., Disp: 1700 g, Rfl: 2    QUEtiapine  (SEROQUEL ) 25 MG tablet, 1 TABLET BY MOUTH AT BEDTIME. MAY INCREASE TO 2 TABLETS AT BEDTIME IF NEEDED, Disp: 180 tablet, Rfl: 1    SYMBICORT  80-4.5 mcg/actuation inhaler, Inhale 2 puffs in the morning and 2 puffs before bedtime., Disp: 10.2 g, Rfl: 11    tocilizumab -aazg (Bethany Meyer  AUTOINJECTOR) 162 mg/0.9 mL PnIj, Inject the contents of 1 pen (162 mg) under the skin once a week., Disp: 10.8 mL, Rfl: 3    traMADol  (ULTRAM ) 50 mg tablet, Take 1 tablet (50 mg total) by mouth every six (6) hours as needed for pain., Disp: 120 tablet, Rfl: 0    VITAMIN B-1 100 mg tablet, Take 1 tablet (100 mg total) by mouth daily., Disp: 100 tablet, Rfl: 3

## 2024-06-24 NOTE — Progress Notes (Addendum)
 [facility] Refill Coordination Note    Specialty Medication(s) to be Shipped:   Inflammatory Disorders: Bethany Meyer     Other medication(s) to be shipped: No additional medications requested for fill at this time    Specialty Medications not needed at this time: N/A     Bethany Meyer, DOB: [birth year]  Phone: There are no phone numbers on file.      All above HIPAA information was verified with patient.     Was a nurse, learning disability used for this call? No    Completed refill call assessment today to schedule patient's medication shipment from the [facility]  [phone]).  All relevant notes have been reviewed.     Specialty medication(s) and dose(s) confirmed: Regimen is correct and unchanged.   Changes to medications: Kharlie reports no changes at this time.  Changes to insurance: No  New side effects reported not previously addressed with a pharmacist or physician: Yes - Patient reports swelling in ankles and feet. Unsure if side effects or RA flaring. Patient would not like to speak to the pharmacist today. Their provider is aware.  Questions for the pharmacist: No    Confirmed patient received a Conservation Officer, Historic Buildings and a Surveyor, Mining with first shipment. The patient will receive a drug information handout for each medication shipped and additional FDA Medication Guides as required.       DISEASE/MEDICATION-SPECIFIC INFORMATION        N/A    SPECIALTY MEDICATION ADHERENCE     Medication Adherence    Patient reported X missed doses in the last month: 1  Specialty Medication: tocilizumab -aazg: Bethany Meyer  AUTOINJECTOR 162 mg/0.9 mL Pnij  Patient is on additional specialty medications: No              Were doses missed due to medication being on hold? No        tocilizumab -aazg: Bethany Meyer  AUTOINJECTOR 162 mg/0.9 mL Pnij : 1 doses of medicine on hand       Specialty medication is an injection or given on a cycle: Yes, Next injection is scheduled for 2.6.26.    REFERRAL TO PHARMACIST     Referral to the pharmacist: Yes - routine compliance concerns. Patient has missed 1-3 doses of medication. Refills were scheduled and concern routed to pharmacist for evaluation.      SHIPPING     Shipping address confirmed in Epic.     Cost and Payment: Patient has a copay of $5.10. They are aware and have authorized the pharmacy to charge the credit card on file.    Delivery Scheduled: Yes, Expected medication delivery date: 2.12.26.     Medication will be delivered via Next Day Courier to the prescription address in Epic WAM.    Doyal Hurst   [facility]  Specialty Technician    This pharmacist was notified by a technician that this patient has reported that they've missed 1 doses of their Bethany Meyer .. I have reviewed the patient's medical record and have determined that no further pharmacist action is needed.      Approximate time spent: 0-5 minutes    Burnard DELENA Neighbors, PharmD, Clinical Specialty Pharmacist  [facility]
